# Patient Record
Sex: Male | Born: 1935 | Race: White | Hispanic: No | Marital: Married | Smoking: Former smoker
Health system: Southern US, Community
[De-identification: ages and names within clinical notes are randomized; demographics above are authoritative.]

## PROBLEM LIST (undated history)

## (undated) DIAGNOSIS — G20A1 Parkinson's disease without dyskinesia, without mention of fluctuations: Secondary | ICD-10-CM

## (undated) DIAGNOSIS — C61 Malignant neoplasm of prostate: Secondary | ICD-10-CM

## (undated) DIAGNOSIS — Z951 Presence of aortocoronary bypass graft: Secondary | ICD-10-CM

## (undated) DIAGNOSIS — I10 Essential (primary) hypertension: Secondary | ICD-10-CM

## (undated) DIAGNOSIS — J189 Pneumonia, unspecified organism: Secondary | ICD-10-CM

## (undated) DIAGNOSIS — I4891 Unspecified atrial fibrillation: Secondary | ICD-10-CM

## (undated) DIAGNOSIS — I442 Atrioventricular block, complete: Secondary | ICD-10-CM

## (undated) DIAGNOSIS — G20 Parkinson's disease: Secondary | ICD-10-CM

## (undated) DIAGNOSIS — I472 Ventricular tachycardia: Secondary | ICD-10-CM

## (undated) DIAGNOSIS — E785 Hyperlipidemia, unspecified: Secondary | ICD-10-CM

## (undated) DIAGNOSIS — C801 Malignant (primary) neoplasm, unspecified: Secondary | ICD-10-CM

## (undated) DIAGNOSIS — I2510 Atherosclerotic heart disease of native coronary artery without angina pectoris: Secondary | ICD-10-CM

## (undated) DIAGNOSIS — Z8546 Personal history of malignant neoplasm of prostate: Secondary | ICD-10-CM

## (undated) DIAGNOSIS — Z950 Presence of cardiac pacemaker: Secondary | ICD-10-CM

## (undated) DIAGNOSIS — D649 Anemia, unspecified: Secondary | ICD-10-CM

## (undated) DIAGNOSIS — J90 Pleural effusion, not elsewhere classified: Secondary | ICD-10-CM

## (undated) DIAGNOSIS — I4892 Unspecified atrial flutter: Secondary | ICD-10-CM

## (undated) HISTORY — DX: Pneumonia, unspecified organism: J18.9

## (undated) HISTORY — DX: Presence of aortocoronary bypass graft: Z95.1

## (undated) HISTORY — DX: Personal history of malignant neoplasm of prostate: Z85.46

## (undated) HISTORY — DX: Anemia, unspecified: D64.9

## (undated) HISTORY — DX: Unspecified atrial flutter: I48.92

## (undated) HISTORY — DX: Hyperlipidemia, unspecified: E78.5

## (undated) HISTORY — PX: TONSILLECTOMY: SUR1361

## (undated) HISTORY — DX: Ventricular tachycardia: I47.2

## (undated) HISTORY — DX: Atrioventricular block, complete: I44.2

---

## 1998-09-03 ENCOUNTER — Other Ambulatory Visit: Admission: RE | Admit: 1998-09-03 | Discharge: 1998-09-03 | Payer: Self-pay | Admitting: Urology

## 1999-05-11 HISTORY — PX: PROSTATE SURGERY: SHX751

## 1999-08-02 ENCOUNTER — Encounter: Payer: Self-pay | Admitting: Emergency Medicine

## 1999-08-02 ENCOUNTER — Emergency Department (HOSPITAL_COMMUNITY): Admission: EM | Admit: 1999-08-02 | Discharge: 1999-08-02 | Payer: Self-pay | Admitting: Emergency Medicine

## 2000-02-03 ENCOUNTER — Other Ambulatory Visit: Admission: RE | Admit: 2000-02-03 | Discharge: 2000-02-03 | Payer: Self-pay | Admitting: Urology

## 2000-02-03 ENCOUNTER — Encounter (INDEPENDENT_AMBULATORY_CARE_PROVIDER_SITE_OTHER): Payer: Self-pay | Admitting: Specialist

## 2001-08-30 ENCOUNTER — Inpatient Hospital Stay (HOSPITAL_COMMUNITY): Admission: EM | Admit: 2001-08-30 | Discharge: 2001-09-10 | Payer: Self-pay | Admitting: Emergency Medicine

## 2001-09-01 ENCOUNTER — Encounter: Payer: Self-pay | Admitting: *Deleted

## 2001-09-04 ENCOUNTER — Encounter: Payer: Self-pay | Admitting: Cardiothoracic Surgery

## 2001-09-04 DIAGNOSIS — Z951 Presence of aortocoronary bypass graft: Secondary | ICD-10-CM

## 2001-09-04 HISTORY — PX: CORONARY ARTERY BYPASS GRAFT: SHX141

## 2001-09-04 HISTORY — DX: Presence of aortocoronary bypass graft: Z95.1

## 2001-09-05 ENCOUNTER — Encounter: Payer: Self-pay | Admitting: Cardiothoracic Surgery

## 2001-09-06 ENCOUNTER — Encounter: Payer: Self-pay | Admitting: Cardiothoracic Surgery

## 2001-09-07 ENCOUNTER — Encounter: Payer: Self-pay | Admitting: Cardiothoracic Surgery

## 2001-09-25 ENCOUNTER — Encounter: Payer: Self-pay | Admitting: Cardiology

## 2001-09-25 ENCOUNTER — Encounter: Admission: RE | Admit: 2001-09-25 | Discharge: 2001-09-25 | Payer: Self-pay | Admitting: Cardiology

## 2001-09-26 ENCOUNTER — Encounter (HOSPITAL_COMMUNITY): Admission: RE | Admit: 2001-09-26 | Discharge: 2001-12-25 | Payer: Self-pay | Admitting: Cardiology

## 2001-10-06 ENCOUNTER — Encounter: Payer: Self-pay | Admitting: Cardiothoracic Surgery

## 2001-10-06 ENCOUNTER — Encounter: Admission: RE | Admit: 2001-10-06 | Discharge: 2001-10-06 | Payer: Self-pay | Admitting: Cardiothoracic Surgery

## 2001-10-30 ENCOUNTER — Encounter: Payer: Self-pay | Admitting: Cardiothoracic Surgery

## 2001-10-30 ENCOUNTER — Encounter: Admission: RE | Admit: 2001-10-30 | Discharge: 2001-10-30 | Payer: Self-pay | Admitting: Cardiothoracic Surgery

## 2001-12-26 ENCOUNTER — Encounter (HOSPITAL_COMMUNITY): Admission: RE | Admit: 2001-12-26 | Discharge: 2002-03-26 | Payer: Self-pay | Admitting: Cardiology

## 2006-12-09 ENCOUNTER — Encounter: Admission: RE | Admit: 2006-12-09 | Discharge: 2006-12-09 | Payer: Self-pay | Admitting: Cardiology

## 2006-12-12 ENCOUNTER — Ambulatory Visit (HOSPITAL_COMMUNITY): Admission: RE | Admit: 2006-12-12 | Discharge: 2006-12-13 | Payer: Self-pay | Admitting: Cardiology

## 2006-12-12 DIAGNOSIS — Z950 Presence of cardiac pacemaker: Secondary | ICD-10-CM

## 2006-12-12 HISTORY — DX: Presence of cardiac pacemaker: Z95.0

## 2006-12-12 HISTORY — PX: PACEMAKER INSERTION: SHX728

## 2010-01-09 HISTORY — PX: NM MYOVIEW LTD: HXRAD82

## 2010-07-17 ENCOUNTER — Inpatient Hospital Stay (INDEPENDENT_AMBULATORY_CARE_PROVIDER_SITE_OTHER)
Admission: RE | Admit: 2010-07-17 | Discharge: 2010-07-17 | Disposition: A | Discharge status: Home / Self Care | Payer: MEDICARE | Source: Ambulatory Visit | Attending: Emergency Medicine | Admitting: Emergency Medicine

## 2010-07-17 ENCOUNTER — Ambulatory Visit (INDEPENDENT_AMBULATORY_CARE_PROVIDER_SITE_OTHER): Payer: MEDICARE

## 2010-07-17 DIAGNOSIS — R1011 Right upper quadrant pain: Secondary | ICD-10-CM

## 2010-07-17 DIAGNOSIS — N200 Calculus of kidney: Secondary | ICD-10-CM

## 2010-07-17 LAB — DIFFERENTIAL
Eosinophils Absolute: 0.3 10*3/uL (ref 0.0–0.7)
Eosinophils Relative: 4 % (ref 0–5)
Lymphocytes Relative: 28 % (ref 12–46)
Lymphs Abs: 2.2 10*3/uL (ref 0.7–4.0)
Monocytes Absolute: 1.1 10*3/uL — ABNORMAL HIGH (ref 0.1–1.0)
Monocytes Relative: 14 % — ABNORMAL HIGH (ref 3–12)

## 2010-07-17 LAB — CBC
HCT: 37.1 % — ABNORMAL LOW (ref 39.0–52.0)
MCH: 30.8 pg (ref 26.0–34.0)
MCV: 95.4 fL (ref 78.0–100.0)
Platelets: 206 10*3/uL (ref 150–400)
RDW: 14.3 % (ref 11.5–15.5)
WBC: 7.8 10*3/uL (ref 4.0–10.5)

## 2010-07-17 LAB — POCT URINALYSIS DIPSTICK
Hgb urine dipstick: NEGATIVE
Ketones, ur: NEGATIVE mg/dL
Protein, ur: NEGATIVE mg/dL
Specific Gravity, Urine: <=1.005 (ref 1.005–1.030)
Urobilinogen, UA: 0.2 mg/dL (ref 0.0–1.0)
pH: 6 (ref 5.0–8.0)

## 2010-09-22 NOTE — Cardiovascular Report (Signed)
NAME:  [name] Powell, [name] Powell NO.:  [id number]   MEDICAL RECORD NO.:  [id number]          PATIENT TYPE:  OIB   LOCATION:  6524                         FACILITY:  [facility]   PHYSICIAN:  Jeanella Craze. Little, M.D. DATE OF BIRTH:  [birth year]   DATE OF PROCEDURE:  DATE OF DISCHARGE:                            CARDIAC CATHETERIZATION   INDICATIONS FOR TEST:  This 77 year old male had bypass surgery in 2003.  He underwent a routine 5-year Cardiolite study.  While walking on the  treadmill, he had no symptoms, but as soon as the procedure was over he  became dizzy.  At this point, he had an episode of complete heart block.  His rate, however, did not drop below 75 with this.  He has had for a  long period of time, a first-degree AV block with a PR interval of about  0.4.  When he came in for his treadmill test, he had developed a new  left bundle branch block.   A nuclear study raised a concern of some anterior apical ischemia, and  because of this the patient is brought in for cardiac catheterization  and plans for permanent pacemaker implantation.   After obtaining informed consent, the patient was prepped and draped in  the usual sterile fashion exposing the right groin.  Following local  anesthetic with 1% Xylocaine, the Seldinger technique was employed, and  a 5-French introducer sheath was placed in the right femoral artery.  Left and right coronary arteriography, graft visualization and  ventriculography was performed.   Initially, an 035 guide wire would not pass up his iliac.  A JR-4 with a  Glidewire was used to navigate the iliacs, and once the catheter was in  the aortic arch, with each catheter exchange, wire exchange technique  was used.   COMPLICATIONS:  None.   EQUIPMENT:  JL-3.5 5-French catheters used to cannulate his left main  and internal mammary artery, was used to first get up and then engage  the internal mammary artery.  The vein grafts were all  cannulated with a  JR-4 catheter.   TOTAL CONTRAST USED:  210 mL.   RESULTS:  Hemodynamic monitoring.  Central aortic pressure was 168/60.  Left ventricular pressure was 169/10 with no gradient noted at the time  of pullback.  The left ventricular end-diastolic pressure was 15.   Ventriculography.  Ventriculography in the RAO projection revealed the  LV cavity to be slightly dilated, and it was clearly underfilled.  The  ejection fraction around 50%.  The left ventricular end-diastolic  pressure was 15.   (On his Cardiolite study, his ejection fraction was calculated at 54%).   DISTAL AORTOGRAM:  The distal aortogram was performed because of  difficulty passing and 035 guidewire up his iliacs.  Contrast 20 mL at  12 mL per second were used.  There was no infrarenal aneurysm, and there  is no evidence of iliac disease.  Renal arteries were poorly visualized.   CORONARY ARTERIOGRAPHY:  On fluoroscopy, there was dense calcification  in the left main and proximal LAD.  1. Left main.  There was  terminal 80% narrowing that was hypo-lucent.  2. Ramus intermediate.  This vessel in its proximal segment showed bi-      directional flow, and the distal vessel was not visualized via the      native circulation.  3. Circumflex.  The circumflex gave rise to an OM system that      bifurcated.  Just proximal this OM system, was a focal 90% area of      narrowing.  4. Right coronary artery.  The right coronary was 100% occluded in its      mid-portion.  In the proximal segment, there are multiple areas of      40% to 50% sequential narrowing.  5. Right coronary artery.  Right coronary was a large vessel.  It gave      rise to the PDA, and after this there was bi-directional flow.  At      the acute margin of the heart was a 15-mm long segment of about 80%      to 90% narrowing.  6. Grafts.  Saphenous vein graft to the ramus intermediate.  The graft      was widely patent.  The ramus was widely  patent.  7. Saphenous vein graft to the diagonal.  Graft was widely patent,      diagonal widely patent.  8. Saphenous vein graft to the PDA.  The graft was widely patent.  The      PDA was widely patent, and there was retrograde filling of three      very large posterior lateral vessels that were also free of      disease.  9. Internal mammary artery to the mid-LAD.  The internal mammary      artery was widely patent.  However, there was a small tubular area      in the proximal portion of the IMA that was about 20% to 30%      narrowed.  The distal LAD appeared to be relatively small, compared      to the other vessels and free of significant disease.   CONCLUSION:  1. Severe native disease with widely patent grafts x4.  2. Normal LV systolic function with only mild dilatation of the left      ventricular cavity.   At the end of the procedure, Dr. Tami Ribas took over for implantation of a  pacemaker for intermittent complete heart block.           ______________________________  Jeanella Craze. Little, M.D.     ABL/MEDQ  D:  12/12/2006  T:  12/12/2006  Job:  [id number]   cc:   Juanda Bond. Altheimer, M.D.  Octavia Heir, MD  Catheterization Lab  Jeanella Craze. Little, M.D.

## 2010-09-22 NOTE — Op Note (Signed)
NAME:  GIANPAOLO, MCMULLEN NO.:  [id number]   MEDICAL RECORD NO.:  [id number]          PATIENT TYPE:  OIB   LOCATION:  [id number]                         FACILITY:  [facility]   PHYSICIAN:  Octavia Heir, MD  DATE OF BIRTH:  Apr 08, 1923   DATE OF PROCEDURE:  12/12/2006  DATE OF DISCHARGE:                               OPERATIVE REPORT   PROCEDURE:  Insertion of a Medtronic Adapta LADDRL1, serial number  [id number] with active atrial and ventricular leads.   COMPLICATIONS:  None.   INDICATIONS:  Mr. Engquist is an 77 year old male patient Dr. Aldona Bar  and Dr. Legrand Como Altheimer with a history of knee history of CAD and  status post remote coronary bypass surgery in April 2003, history of a  chronic bradycardia with first degree AV block.  He did recently develop  a new left bundle branch block.  He underwent exercise treadmill testing  with subsequent development of a brief episode of complete heart block,  which was transient and resolved with rest.  Subsequent Cardiolite scan  suggested a mild anterior wall ischemia.  He is now brought for cardiac  catheterization by Dr. Aldona Bar revealing a widely patent LIMA to his  LAD, patent vein graft to ramus intermedius, patent vein graft to  diagonal, patent vein graft to the PDA.  He does have significant  negative three-vessel disease.  Ejection fraction is approximately 50%.  He is now brought for dual-chamber pacer implant.   DESCRIPTION OF OPERATION:  After informed consent, the patient left  anterior chest was shaved, prepped and draped sterile fashion.  EC  monitor established.  1% lidocaine was then used anesthetize left mid  subclavicular area.  Next approximately 3 cm mid subclavicular incision  was then carried out.  Hemostasis obtained with electrocautery.  Blunt  dissection used to carry this down to the left pectoral fascia.  Again  hemostasis obtained electrocautery.  Next approximately 3 x 4-cm pocket  was created  left pectoral fascia.  The left subclavian vein was then  easily entered with two retained guidewires.  Over the first retained  guidewire a #7-French dilator sheath were then easily inserted and  guidewire and dilator then removed.  Through the first sheath a 58-cm  active Medtronic lead model number 5076 serial #[id number] [id number] then passed  into the right atrium.  Peel-away sheaths then removed.  Over the second  retained guidewire a second 7-French dilator sheath then easily passed  and the dilator and guidewire removed.  Through this a 50-cm active  Medtronic lead model [id number] serial [id number] was then easily passed into  right atrium and peel-away sheaths removed.  A J curve was then placed  in the ventricular lead and ventricular lead was allowed to prolapse  through the tricuspid valve and positioned the RV apex without  difficulty.  We were able to capture 2 volts, the screw was then  extended.  Thresholds determined.  R-waves measured 13.8 mV.  Impedance  1064 ohms.  Threshold ventricle was 0.9 volts at 0.5 milliseconds..  Current was 1.1 mA.  10 volts negative for diaphragmatic stimulation.  The preformed J stylet was then inserted in the atrial lead and the  atrial lead was then used to map the right atrium.  It was difficult to  find an excellent position for the atrial lead; however,  ultimately we  were able to find a P-waves of 1.7 to 2.  Threshold in the atrium was  1.2 at 0.5 during test; however, this dropped to less than 1 post  implant.  Currents 1.6 ma.  Impedance 983 ohms.  Again 10 volts negative  diaphragmatic stimulation.  This lead was screwed into place as well.  Two silk sutures then used to anchor the leads to the pectoral fascia.  Again hemostasis obtained with electrocautery.  Pockets then copiously  irrigated with 1% kanamycin solution.  The leads were connected, serial  fashion to a Medtronic Adapta LADDR L1 serial [id number].  Head screws  were tightened and  pacing was confirmed.  Single silk suture placed in  the apex pocket.  The generator leads then delivered to pocket.  Header  was secured with silk suture.  Subcutaneous layer was then closed using  2-0 Vicryl.  The skin was then closed in 4-0 Vicryl.  Steri-Strips were  applied.  The patient returned to recovery room in stable condition.   CONCLUSIONS:  Successful implant of a Medtronic Adapta LADDRL1, serial  [id number] with active atrial and ventricular leads.      Octavia Heir, MD  Electronically Signed     RHM/MEDQ  D:  12/12/2006  T:  12/13/2006  Job:  [id number]   cc:   Jeanella Craze. Little, M.D.  Juanda Bond. Altheimer, M.D.

## 2010-09-25 NOTE — Cardiovascular Report (Signed)
[place]. [facility]  Patient:    [name] Powell, [name] Powell Visit Number: [id number] MRN: [id number]          Service Type: MED Location: 2000 2021 01 Attending Physician:  Lawana Pai Dictated by:   Chase Picket, M.D. Proc. Date: 08/31/01 Admit Date:  08/30/2001   CC:         Juanda Bond. Altheimer, M.D.  James L. Oletta Lamas, M.D.  CVTS   Cardiac Catheterization  INDICATIONS FOR PROCEDURE: The patient is 77 year old male, who has had a three months history of exertional angina. Yesterday, had a more prolonged episode of angina that was more intense in nature, came to the emergency room and was pain-free. His ECG at that time was completely normal except for bradycardia and first-degree AV block. His cardiac enzymes were low-positive and he was brought to the catheterization lab for cardiac catheterization.  DESCRIPTION OF PROCEDURE: The patient was prepped and draped in the usual sterile fashion exposing the right groin.  Following local anesthetic with 1% Xylocaine, the Seldinger technique was employed and a 5 [place] introducer sheath was placed into the right femoral artery.  Selective left and right coronary arteriography and ventriculography in the RAO projection was performed.  COMPLICATIONS: None.  EQUIPMENT: A 5 French Judkins configuration catheters, 3.5 JL4 used to cannulate the left main.  TOTAL CONTRAST: 95 cc.  RESULTS: 1. Hemodynamic monitoring: Central aortic pressure 144/59, left    ventricular pressure 145/8. There was no aortic valve gradient noted at    the time of pullback. 2. Ventriculography: Ventriculography in the RAO projection revealed    normal left ventricular systolic function.  The ejection fraction    was greater than 60%.  The end-diastolic pressure was 8. PVCs were    noted during the ventriculogram.  CORONARY ARTERIOGRAPHY: Calcification in the left main and LAD distribution was noted on fluoroscopy. 1. Left  main: There was a 60% hazy area of narrowing in the midportion    of the left main. 2. LAD: The LAD had a complex lesion just proximal to the bifurcation of the    LAD and the diagonal. This area was at least 70% narrowed. The ongoing    LAD was irregular and then totally occluded in the proximal/distal    segment. There was retrograde filling of the distal LAD. 3. First diagonal: The first diagonal had proximal 50% area of narrowing    with mild irregularities. 4. Optional diagonal: Optional diagonal was a very large vessel with mild    irregularities. 5. Circumflex: The circumflex was a nondominant vessel with a small    OM with high-grade stenosis in the proximal portion of this 1 mm    vessel. 6. Right coronary artery: The right coronary artery is a very large dominant    vessel with a 90% lesion in its midportion. The PDA and posterolateral    branches were free of disease.  CONCLUSIONS: Left main disease, high-grade disease and left anterior descending and right coronary artery normal with normal left ventricular function.  RECOMMENDATIONS: The patient needs bypass surgery. Will restart heparin and contact CVTS. Dictated by:   Chase Picket, M.D. Attending Physician:  Lawana Pai DD:  08/31/01 TD:  08/31/01 Job: [id number]

## 2010-09-25 NOTE — Consult Note (Signed)
[place]. [facility]  Patient:    [name] Powell, [name] Powell Visit Number: [id number] MRN: [id number]          Service Type: MED Location: 2000 2021 01 Attending Physician:  Lawana Pai Dictated by:   Len Childs, M.D. Proc. Date: 09/01/01 Admit Date:  08/30/2001   CC:         Juanda Bond. Altheimer, M.D.   Consultation Report  REASON FOR CONSULTATION:  Left main and three-vessel coronary artery disease, unstable angina.  REQUESTING PHYSICIAN:  Chase Picket, M.D.  PRIMARY CARE PHYSICIAN: Juanda Bond. Altheimer, M.D.  CHIEF COMPLAINT:  Chest pain.  HISTORY OF PRESENT ILLNESS:  I was asked to see this 77 year old white male in consultation by Dr. Rex Kras for evaluation of potential surgical coronary revascularization for recently diagnosed left main and severe three-vessel coronary artery disease.  The patient has a history of coronary artery disease and underwent cardiac catheterization in 1994, which demonstrated mild disease.  He was treated medically.  He has remained very active, walking four miles a day and doing push ups, until recently.  He noted the onset of chest pain during his walk.  The pain would be relieved after stopping the walk and was not associated with nausea, diaphoresis or shortness of breath.  It was substernal and tight squeezing pain.  The patient began having severe pain on the date of admission (August 30, 2001) and presented to the emergency room for evaluation.  The pain did not resolve with rest.  An EKG showed nonspecific ST segment changes, and an CPK-MB showed 3.3 nanogram/mL.  He was placed on heparin and given beta blocker, aspirin and nitroglycerin; underwent cardiac catheterization.  This demonstrated a 60% left main stenosis, with 99-100% stenosis of the LAD (which filled via collaterals distally), 70% stenosis of the ramus intermediate and 95% stenosis of the right coronary artery.  His ejection fraction was  55% and left ventricular end-diastolic pressure was 10. He was maintained on heparin post-catheterization without recurrent pain.  The patient is currently stable in the heart unit.  He is awaiting surgical evaluation.  PAST MEDICAL HISTORY: 1. Diet-controlled diabetes mellitus. 2. History of prostate cancer, status post seed irradiation in October 2001.    Without evidence of recurrence. 3. Hyperlipidemia. 4. History of bradycardia.  HOME MEDICATIONS:  Aspirin, Lipitor, Flomax.  He has been placed on Plavix since admission to the hospital.  ALLERGIES:  No known drug allergies.  SOCIAL HISTORY:  The patient is married and has an adult daughter.  He is a retired Art gallery manager and is quite active -- walking four miles a day. He does not smoke nor use alcohol.  FAMILY HISTORY:  His mother died of a heart attack in her 17s.  Otherwise negative.  REVIEW OF SYSTEMS:  The patient denies any constitutional symptoms of weight loss, fever or night sweats.  He denies any significant trauma to the thorax or lower extremities.  He denies any bleeding problems or easy bruising. He has had previous surgical procedures for tonsillectomy, left knee arthroscopy and the seed irradiation of his prostate gland.  He developed a superficial rectal ulcer from the irradiated seeds, which was treated with a topical suppository by Dr. Laurence Spates (his gastroenterologist), with resolution of the problem.  He denies any pulmonary history of pneumonia, hemoptysis or TB. He denies any cardiac history other than angina.  Specifically, no symptoms of congestive heart failure orthopnea or PND.  He has had longstanding bradycardia.  He denies any GI history of gallstones, jaundice or hepatitis, or ulcer disease.  He denies any musculoskeletal problems of gout or significant arthritis.  He denies vascular problems with claudication, DVT, TIA or CVA.  He denies neurologic problems of seizures, syncope  or compression.  Skin review is negative for rash or skin cancer.  Psychiatric review is negative for depression, insomnia or diminished appetite.  Otherwise review of systems is negative.  PHYSICAL EXAMINATION:  VITAL SIGNS:  Height 5 feet 8 inches, weight 180 pounds, blood pressure 140/60, heart rate 50 and regular in sinus, oxygen saturation 97% on room air.  GENERAL APPEARANCE:  That of an elderly, pleasant white male -- in his hospital room accompanied by his wife and daughter; on intravenous heparin. He is complaining of no chest pain and he appears in no distress.  He is drinking a cup of tea.  HEENT:  Normocephalic.  Full EOMs.  Pharynx clear.  Dentition under good repair.  NECK:  Supple without JVD, thyromegaly, mass or carotid bruits.  LYMPHATICS:  Reveals no palpable adenopathy of the cervical, supraclavicular or axillary regions.  LUNGS:  Clear to auscultation without thoracic deformity.  CARDIAC:  With a regular rate and rhythm, which is slow.  No murmur, rub or gallop.  ABDOMEN:  Soft and nontender; without organomegaly, mass or abdominal bruit.  EXTREMITIES:  Reveal no swollen or tender joints, clubbing, edema or cyanosis. The right groin calf site has a very small ecchymotic area, but no hematoma.  VASCULAR:  Reveals 2+ pulses bilaterally in the radial, femoral and tibial vessels.  There is no venous insufficiency of the lower extremities.  SKIN:  Warm, clear and dry; without rash or lesion.  RECTAL:  Deferred.  NEUROLOGIC:  Alert and oriented x3, with full motor function without focal deficit.  LABORATORY DATA:  Creatinine 0.9, hematocrit 38, platelet count 170,000.  LFTs normal.  CHEST X-RAY:  Pending.  ASSESSMENT:  I reviewed the coronary arteriograms with Dr. Rex Kras and agree with his impression of left main and three-vessel disease.  I agree with his recommendation for surgical coronary revascularization.   PLAN:  I have discussed the  indications and expected benefits of coronary bypass surgery for treatment of his coronary artery disease.  I have discussed the major aspects of the proposed operation with the patient and his family, including the choice of conduit for grafting, the location of the surgical incisions, the use of general anesthesia and cardiopulmonary bypass, and the expected postoperative hospital recovery.  I reviewed with the patient the alternatives to surgical therapy for treatment of his heart disease.  We discussed the risks of coronary artery bypass graft surgery at the time of my consultation, and we discussed specifically the risks of MI, CVA, bleeding, infection, blood transfusion requirement and death.  We will discontinue the Plavix to try and optimize his coagulation function following surgery, and continue the heparin.  All questions are answered and the surgery will be scheduled for Monday afternoon, September 04, 2001.  Thank you very much for this consultation.   Dictated by:   Len Childs, M.D. Attending Physician:  Lawana Pai DD:  09/01/01 TD:  09/01/01 Job: [id number] [id number]

## 2010-09-25 NOTE — Discharge Summary (Signed)
[place]. [facility]  Patient:    [name] Powell, [name] Powell Visit Number: [id number] MRN: [id number]          Service Type: MED Location: 2000 2034 01 Attending Physician:  Len Childs Dictated by:   Sheliah Hatch, P.A. Admit Date:  08/30/2001 Discharge Date: 09/10/2001   CC:         Chase Picket, M.D.  Juanda Bond. Altheimer, M.D.   Discharge Summary  DATE OF BIRTH:  1923-01-17  CARDIOLOGIST:  Chase Picket, M.D.  PRIMARY CARE PHYSICIAN:  Juanda Bond. Altheimer, M.D.  ADMISSION DIAGNOSIS:  Chest pain.  FINAL DIAGNOSES: 1. Unstable angina. 2. Non-Q wave myocardial infarction. 3. Severe three-vessel coronary artery disease with normal systolic function,    ejection fraction of 60%. 4. Hyperlipidemia. 5. Type 2 diabetes mellitus. 6. Postoperative bradycardia. 7. Acute renal insufficiency. 8. Postoperative anemia. 9. History of prostate cancer.  PROCEDURES: 1. Cardiac catheterization on August 31, 2001. 2. Ankle brachial indexes and carotid Dopplers on August 31, 2001 showing no    internal carotid artery stenosis and palpable pulses in both feet. 3. Coronary artery bypass graft x4 on September 04, 2001.  HISTORY:  The patient is a 77 year old white male patient of Dr. Aldona Bar with known history of coronary artery disease.  He had a previous cardiac catheterization in 1994 and has been treated medically since.  He has been very active, exercising regularly.  He has recently noticed the onset of chest pain with walking, relieved with rest.  The day of admission, he presented to the emergency room with severe chest pain, which did not resolve with rest. Cardiogram showed nonspecific ST changes and cardiac enzymes were elevated. He was ruled in for non-Q wave MI.  He was seen by cardiology and stabilized. He was taken to the cardiac catheterization lab the following day and the catheterization demonstrated severe three-vessel coronary disease.   CVTS consultation was obtained.  Dr. Prescott Gum evaluated Mr. Shallow and recommended coronary artery bypass grafting as best treatment.  The risks, benefits, details, and alternatives were discussed and it was agreed to proceed with surgery.  He remained stable for the next few days and underwent surgery on September 04, 2001.  He had CABG x4 with the following grafts:  LIMA to LAD, saphenous vein graft to PDA, saphenous vein graft to diagonal, and saphenous vein graft to ramus.  Postoperative, he did well with routine care.  He did have some bradycardia, which required atrial pacing, but this did not impede his progress.  He was transferred to unit 2000 postoperative day #2.  Of note, he was found to have elevated blood sugars and has been ruled in for type 2 diabetes mellitus.  He has been put on Amaryl after a few days of sliding scale insulin.  He has done well on unit 2000, walking with cardiac rehab.  He was anemic and has been put on iron.  Postoperative day #5, he is doing very well.  He is afebrile.  Vital signs are stable.  Wounds are healing well. Physical exam is satisfactory.  CBGs are adequately controlled.  It is anticipated he should be suitable for discharge pending satisfactory morning rounds on Sep 10, 2001.  DISCHARGE MEDICATIONS: 1. Enteric-coated aspirin 325 mg 1 p.o. q.d. 2. Flomax 0.4 mg 1 p.o. b.i.d. 3. Amaryl 1 mg p.o. q.d. 4. Niferex 150 one p.o. q.d. 5. Colace 100 mg 2 tablets daily. 6. Lipitor 20 mg daily. 7. Percocet 5/325  one to two p.o. q.4-6h. p.r.n. for pain.  ALLERGIES:  No known drug allergies.  CONDITION:  Stable.  DISPOSITION:  Home.  FOLLOWUP: 1. Staples out in one week. 2. He will see Dr. Rex Kras in two weeks. 3. He will see Dr. Prescott Gum in three weeks with [facility]. Dictated by:   Sheliah Hatch, P.A. Attending Physician:  Len Childs DD:  09/09/01 TD:  09/12/01 Job: [id number]

## 2010-09-25 NOTE — Op Note (Signed)
[place]. [facility]  Patient:    [name], [name] Powell Visit Number: [id number] MRN: [id number]          Service Type: MED Location: [id number] Attending Physician:  Len Childs Dictated by:   Len Childs, M.D. Proc. Date: 09/04/01 Admit Date:  08/30/2001   CC:         CVTS office  Delrae Alfred B. Little, M.D.   Operative Report  PREOPERATIVE DIAGNOSIS:  Left main stenosis, severe three-vessel coronary artery disease, unstable angina.  POSTOPERATIVE DIAGNOSIS:  Left main stenosis, severe three-vessel coronary artery disease, unstable angina.  OPERATION PERFORMED:  Coronary artery bypass grafting times four (left internal mammary artery to left anterior descending, saphenous vein graft to diagonal to diagonal, saphenous vein graft to ramus intermediate.  Saphenous vein graft to posterior descending coronary artery).  SURGEON:  Len Childs, M.D.  ASSISTANT: Gratiot C. Roxan Hockey, M.D. 2. Sheliah Hatch, P.A.  ANESTHESIA:  General by Dr. Lorrene Reid.  INDICATIONS FOR PROCEDURE:  The patient is a 77 year old diet controlled diabetic who presented with symptoms of unstable angina and ruled out for MI. Cardiac cath demonstrated left main and severe three-vessel coronary artery disease.  He was felt to be a candidate for surgical coronary revascularization.  Prior to his operation I discussed the cardiac catheterization with the patient and his wife.  I also discussed with the patient and his wife in his hospital room the indications and expected benefits of coronary artery bypass surgery for treatment of his coronary artery disease.  I discussed the potential alternative therapies to treat his coronary artery disease.  I reviewed with the patient and his wife the major aspects of the coronary operation including the choice of conduit for grafting, the location of the surgical incisions, the use of general anesthesia and  cardiopulmonary bypass, and the expected postoperative hospital recovery.  I reviewed with the patient the risks of to him of coronary artery bypass surgery including the risks of MI, CVA, bleeding, blood transfusion requirement, infection and death.  He understood these implications for the surgery and agreed to proceed with the operating room as planned under what I felt was an informed consent.  OPERATIVE FINDINGS:  The saphenous vein was harvested from both lower extremities.  It became two small to use below the right knee and vein was removed from the left lower leg as well.  The mammary artery was small but had good flow.  The LAD was severely and diffusely diseased but a 1.5 probe passed to the apex from the point of the anastomosis.  The distal circumflex system was too small to graft.  The right coronary artery was dominant and was heavily diseased with thick plaque proximally and the graft was placed in the posterior descending coronary artery just off the main right coronary.  No blood products or inotropes were used for the surgery.  DESCRIPTION OF PROCEDURE:  The patient was brought to the operating room and placed supine on the operating table where general anesthesia was induced under invasive hemodynamic monitoring.  The chest abdomen and legs were prepped with Betadine and draped as a sterile field.  A sternal incision was made as the saphenous vein was harvested from the lower extremities.  The internal mammary artery was harvested as a pedicle graft from its origin at the subclavian vessels.  Heparin was administered and the ACT was documented as being therapeutic.  The sternal retractor was placed.  Through  pursestrings placed in the ascending aorta and right atrium, the patient was cannulated and placed on bypass.  The coronaries were identified for grafting and the mammary artery and vein grafts were prepared for the distal anastomoses.  The cardioplegia cannula was  placed and the patient was cooled to 28 degrees.  As the aortic crossclamp was applied.  900 cc of cold blood cardioplegia was delivered to the aortic root with immediate cardioplegic arrest and septal temperature dropping to less than 15 degrees.  Topical iced saline slush was used to augment myocardial preservation and a pericardial insulator pad was used to protect the left phrenic nerve.  The distal coronary anastomoses were then performed.  The first distal anastomosis was to the posterior descending.  This was a 1.5 mm vessel with proximal 95% stenosis.  A reversed saphenous vein was sewn end-to-side with a running 7-0 Prolene with a good flow through the graft.  The second distal anastomosis was to the diagonal.  This was a 1.5 vessel with proximal 95% stenosis.  A reversed saphenous vein was sewn end-to-side with running 7-0 Prolene with good flow through the graft.  The third distal anastomosis was to the ramus intermediate.  This was a heavily diseased vessel with 90% proximal stenosis.  The reversed saphenous vein was sewn end-to-side with running 7-0 Prolene and there was good flow through the graft.  Cardioplegia was redosed. A fourth distal anastomosis was to the distal LAD where it reconstituted after being proximally occluded.  The left internal mammary artery pedicle was brought through an opening created in the left lateral pericardium and was brought down onto the LAD and sewn end-to-side with a running 8-0 Prolene. There was excellent flow through the anastomosis with immediate rise in septal temperature after release of the pedicle clamp on the mammary artery.  The mammary pedicle was secured to the epicardium and the aortic crossclamp was removed.  The heart was cardioverted back to a regular rhythm.  Three proximal vein anastomoses were placed on the ascending aorta using a partial occlusion clamp and a 4.0 mm punch.  The partial clamp was removed and the vein  grafts were  perfused.  Each had excellent flow and hemostasis was documented at the proximal and distal sites.  The patient was rewarmed and reperfused. Temporary pacing wires were applied.  When the patient reached 37 degrees, the lungs were re-expanded and the ventilator was resumed.  The patient was AV sequentially paced at a rate of 90 beats per minute.  The patient separated from bypass without inotropes with stable blood pressure and cardiac output. Protamine was administered without adverse effect.  The cannulas were removed. The mediastinum was irrigated with warm antibiotic irrigation.  The leg incision was irrigated and closed in a standard fashion.  The superior pericardial fat was closed over the aorta and vein grafts.  Two mediastinal and a left pleural chest tube were placed and brought out through separate incisions.  The sternum was reapproximated with interrupted steel wire.  The pectoralis fascia was closed with interrupted #1 Vicryl.  The subcutaneous tissues and skin were closed with a running Vicryl.  Total bypass time was 110 minutes with aortic crossclamp time of 50 minutes. Dictated by:   Len Childs, M.D. Attending Physician:  Len Childs DD:  09/04/01 TD:  09/05/01 Job: [id number]

## 2011-02-22 LAB — CBC
HCT: 36.6 — ABNORMAL LOW
MCV: 89.7
Platelets: 208
RDW: 14.2 — ABNORMAL HIGH
WBC: 8.1

## 2011-02-22 LAB — BASIC METABOLIC PANEL
BUN: 19
Creatinine, Ser: 1.06
GFR calc non Af Amer: >60
Glucose, Bld: 141 — ABNORMAL HIGH
Potassium: 4.2

## 2011-05-12 ENCOUNTER — Ambulatory Visit
Admission: RE | Admit: 2011-05-12 | Discharge: 2011-05-12 | Disposition: A | Discharge status: Home / Self Care | Payer: Medicare Other | Source: Ambulatory Visit | Attending: Endocrinology | Admitting: Endocrinology

## 2011-05-12 ENCOUNTER — Other Ambulatory Visit: Payer: Self-pay | Admitting: Endocrinology

## 2011-05-12 DIAGNOSIS — R05 Cough: Secondary | ICD-10-CM

## 2011-08-05 ENCOUNTER — Emergency Department (HOSPITAL_COMMUNITY)
Admission: EM | Admit: 2011-08-05 | Discharge: 2011-08-05 | Disposition: A | Discharge status: Home / Self Care | Payer: Medicare Other | Attending: Emergency Medicine | Admitting: Emergency Medicine

## 2011-08-05 ENCOUNTER — Other Ambulatory Visit: Payer: Self-pay

## 2011-08-05 ENCOUNTER — Encounter (HOSPITAL_COMMUNITY): Payer: Self-pay | Admitting: *Deleted

## 2011-08-05 ENCOUNTER — Emergency Department (HOSPITAL_COMMUNITY): Payer: Medicare Other

## 2011-08-05 DIAGNOSIS — I25810 Atherosclerosis of coronary artery bypass graft(s) without angina pectoris: Secondary | ICD-10-CM | POA: Insufficient documentation

## 2011-08-05 DIAGNOSIS — R079 Chest pain, unspecified: Secondary | ICD-10-CM | POA: Insufficient documentation

## 2011-08-05 DIAGNOSIS — Z950 Presence of cardiac pacemaker: Secondary | ICD-10-CM | POA: Insufficient documentation

## 2011-08-05 DIAGNOSIS — R0789 Other chest pain: Secondary | ICD-10-CM

## 2011-08-05 DIAGNOSIS — E119 Type 2 diabetes mellitus without complications: Secondary | ICD-10-CM | POA: Insufficient documentation

## 2011-08-05 DIAGNOSIS — I10 Essential (primary) hypertension: Secondary | ICD-10-CM | POA: Insufficient documentation

## 2011-08-05 HISTORY — DX: Essential (primary) hypertension: I10

## 2011-08-05 HISTORY — DX: Atherosclerotic heart disease of native coronary artery without angina pectoris: I25.10

## 2011-08-05 HISTORY — DX: Presence of cardiac pacemaker: Z95.0

## 2011-08-05 LAB — CBC
MCHC: 32.7 g/dL (ref 30.0–36.0)
RDW: 14.9 % (ref 11.5–15.5)
WBC: 7 10*3/uL (ref 4.0–10.5)

## 2011-08-05 LAB — DIFFERENTIAL
Basophils Absolute: 0 10*3/uL (ref 0.0–0.1)
Basophils Relative: 0 % (ref 0–1)
Lymphocytes Relative: 28 % (ref 12–46)
Neutro Abs: 3.9 10*3/uL (ref 1.7–7.7)
Neutrophils Relative %: 56 % (ref 43–77)

## 2011-08-05 LAB — POCT I-STAT TROPONIN I: Troponin i, poc: 0.02 ng/mL (ref 0.00–0.08)

## 2011-08-05 LAB — POCT I-STAT, CHEM 8
BUN: 30 mg/dL — ABNORMAL HIGH (ref 6–23)
Chloride: 104 mEq/L (ref 96–112)
HCT: 35 % — ABNORMAL LOW (ref 39.0–52.0)
Potassium: 3.9 mEq/L (ref 3.5–5.1)
Sodium: 142 mEq/L (ref 135–145)

## 2011-08-05 NOTE — ED Notes (Signed)
C/o pain just below pacemaker, onset Tuesday night and also Wednesday night, denies other sx, "never had pain before", "some episodes were sharp others were not", pacemaker placed in 2007. Pt of Dr. Aldona Bar.

## 2011-08-05 NOTE — ED Provider Notes (Signed)
History     CSN: [id number]  Arrival date & time 08/05/11  0048   First MD Initiated Contact with Patient 08/05/11 0105      Chief Complaint  Patient presents with  . Chest Pain    (Consider location/radiation/quality/duration/timing/severity/associated sxs/prior treatment) HPI This is an 78 year old white male with a history of pacemaker. For 2 nights in a row he has had chest pain. The pain is sharp, well localized inferior laterally to the pacemaker. It is a sharp sometimes severe fleeting pain that only lasts a few seconds. He states it feels like it is in his muscle (his pectoralis major on the left) rather than deep in his chest. Some episodes are worse than others. The symptoms are exacerbated by lying on his left side. It does not occur during the day. It is not associated with dyspnea, diaphoresis, nausea or vomiting. Pain does not radiate.  Past Medical History  Diagnosis Date  . Diabetes mellitus   . Hypertension   . Pacemaker   . Coronary artery disease     Past Surgical History  Procedure Date  . Pacemaker insertion     av paced  . Coronary artery bypass graft     History reviewed. No pertinent family history.  History  Substance Use Topics  . Smoking status: Never Smoker   . Smokeless tobacco: Not on file  . Alcohol Use: No      Review of Systems  All other systems reviewed and are negative.    Allergies  Review of patient's allergies indicates no known allergies.  Home Medications   Current Outpatient Rx  Name Route Sig Dispense Refill  . ASPIRIN 325 MG PO TABS Oral Take 325 mg by mouth every 7 (seven) days. On Wednesday    . ASPIRIN EC 81 MG PO TBEC Oral Take 81 mg by mouth daily. Daily EXCEPT Wednesday    . ATORVASTATIN CALCIUM 20 MG PO TABS Oral Take 20 mg by mouth daily.    Marland Kitchen VITAMIN D PO Oral Take 10,000 Units by mouth every 7 (seven) days. On Friday    . GLIMEPIRIDE 4 MG PO TABS Oral Take 4 mg by mouth daily before breakfast.    .  METOPROLOL SUCCINATE ER 50 MG PO TB24 Oral Take 25-50 mg by mouth 2 (two) times daily. Take with or immediately following a meal.    . PRESERVISION AREDS PO CAPS Oral Take 1 capsule by mouth 2 (two) times daily.    Marland Kitchen POLYETHYLENE GLYCOL 3350 PO PACK Oral Take 17 g by mouth daily. To prevent constipation    . SITAGLIPTIN PHOSPHATE 100 MG PO TABS Oral Take 100 mg by mouth daily.    Marland Kitchen VALSARTAN-HYDROCHLOROTHIAZIDE 160-12.5 MG PO TABS Oral Take 1 tablet by mouth daily.      BP 134/62  Pulse 62  Temp(Src) 97.9 F (36.6 C) (Oral)  Resp 16  SpO2 99%  Physical Exam General: Well-developed, well-nourished male in no acute distress; appearance consistent with age of record HENT: normocephalic, atraumatic Eyes: pupils equal round and reactive to light; extraocular muscles intact Neck: supple Heart: regular rate and rhythm Lungs: A few basilar rales Chest: Pacemaker left upper chest; nontender Abdomen: soft; nondistended; nontender; bowel sounds present Extremities: No deformity; full range of motion; pulses normal; trace edema of lower legs Neurologic: Awake, alert and oriented; motor function intact in all extremities and symmetric; no facial droop Skin: Warm and dry Psychiatric: Normal mood and affect    ED Course  Procedures (  including critical care time)     MDM  EKG Interpretation:  Date & Time: 08/05/2011 12:56 AM  Rate: 63  Rhythm: AV dual paced  QRS Axis: indeterminate  Intervals: Electronically pain  ST/T Wave abnormalities: indeterminate  Conduction Disutrbances:Electronically paced  Narrative Interpretation:   Old EKG Reviewed: unchanged  Nursing notes and vitals signs, including pulse oximetry, reviewed.  Summary of this visit's results, reviewed by myself:  Labs:  Results for orders placed during the hospital encounter of 08/05/11  CBC      Component Value Range   WBC 7.0  4.0 - 10.5 (K/uL)   RBC 3.71 (*) 4.22 - 5.81 (MIL/uL)   Hemoglobin 11.5 (*) 13.0 -  17.0 (g/dL)   HCT 35.2 (*) 39.0 - 52.0 (%)   MCV 94.9  78.0 - 100.0 (fL)   MCH 31.0  26.0 - 34.0 (pg)   MCHC 32.7  30.0 - 36.0 (g/dL)   RDW 14.9  11.5 - 15.5 (%)   Platelets 188  150 - 400 (K/uL)  DIFFERENTIAL      Component Value Range   Neutrophils Relative 56  43 - 77 (%)   Neutro Abs 3.9  1.7 - 7.7 (K/uL)   Lymphocytes Relative 28  12 - 46 (%)   Lymphs Abs 2.0  0.7 - 4.0 (K/uL)   Monocytes Relative 12  3 - 12 (%)   Monocytes Absolute 0.9  0.1 - 1.0 (K/uL)   Eosinophils Relative 3  0 - 5 (%)   Eosinophils Absolute 0.2  0.0 - 0.7 (K/uL)   Basophils Relative 0  0 - 1 (%)   Basophils Absolute 0.0  0.0 - 0.1 (K/uL)  POCT I-STAT, CHEM 8      Component Value Range   Sodium 142  135 - 145 (mEq/L)   Potassium 3.9  3.5 - 5.1 (mEq/L)   Chloride 104  96 - 112 (mEq/L)   BUN 30 (*) 6 - 23 (mg/dL)   Creatinine, Ser 1.40 (*) 0.50 - 1.35 (mg/dL)   Glucose, Bld 143 (*) 70 - 99 (mg/dL)   Calcium, Ion 1.10 (*) 1.12 - 1.32 (mmol/L)   TCO2 28  0 - 100 (mmol/L)   Hemoglobin 11.9 (*) 13.0 - 17.0 (g/dL)   HCT 35.0 (*) 39.0 - 52.0 (%)  POCT I-STAT TROPONIN I      Component Value Range   Troponin i, poc 0.02  0.00 - 0.08 (ng/mL)   Comment 3           POCT I-STAT TROPONIN I      Component Value Range   Troponin i, poc 0.01  0.00 - 0.08 (ng/mL)   Comment 3             Imaging Studies: Dg Chest 2 View  08/05/2011  *RADIOLOGY REPORT*  Clinical Data: 78 year old male with chest pain.  CHEST - 2 VIEW  Comparison: 05/12/2011 and prior chest radiographs dating back to 12/09/2006  Findings: Cardiomegaly, CABG changes and left-sided pacemaker again noted. Left basilar scarring is again noted. There is no evidence of focal airspace disease, pulmonary edema, suspicious pulmonary nodule/mass, pleural effusion, or pneumothorax. No acute bony abnormalities are identified.  IMPRESSION: Cardiomegaly without evidence of acute cardiopulmonary disease.  Original Report Authenticated By: Lura Em, M.D.   3:38  AM The patient's symptoms are not consistent with pain due to coronary artery disease. He has had 2 negative troponins and unchanged EKG. He will followup with Dr. Aldona Bar later today.  Wynetta Fines, MD 08/05/11 [phone]

## 2011-08-05 NOTE — Discharge Instructions (Signed)
Chest Pain (Nonspecific) It is often hard to give a specific diagnosis for the cause of chest pain. There is always a chance that your pain could be related to something serious, such as a heart attack or a blood clot in the lungs. You need to follow up with your caregiver for further evaluation. CAUSES   Heartburn.   Pneumonia or bronchitis.   Anxiety or stress.   Inflammation around your heart (pericarditis) or lung (pleuritis or pleurisy).   A blood clot in the lung.   A collapsed lung (pneumothorax). It can develop suddenly on its own (spontaneous pneumothorax) or from injury (trauma) to the chest.   Shingles infection (herpes zoster virus).  The chest wall is composed of bones, muscles, and cartilage. Any of these can be the source of the pain.  The bones can be bruised by injury.   The muscles or cartilage can be strained by coughing or overwork.   The cartilage can be affected by inflammation and become sore (costochondritis).  DIAGNOSIS  Lab tests or other studies, such as X-rays, electrocardiography, stress testing, or cardiac imaging, may be needed to find the cause of your pain.  TREATMENT   Treatment depends on what may be causing your chest pain. Treatment may include:   Acid blockers for heartburn.   Anti-inflammatory medicine.   Pain medicine for inflammatory conditions.   Antibiotics if an infection is present.   You may be advised to change lifestyle habits. This includes stopping smoking and avoiding alcohol, caffeine, and chocolate.   You may be advised to keep your head raised (elevated) when sleeping. This reduces the chance of acid going backward from your stomach into your esophagus.   Most of the time, nonspecific chest pain will improve within 2 to 3 days with rest and mild pain medicine.  HOME CARE INSTRUCTIONS   If antibiotics were prescribed, take your antibiotics as directed. Finish them even if you start to feel better.   For the next few  days, avoid physical activities that bring on chest pain. Continue physical activities as directed.   Do not smoke.   Avoid drinking alcohol.   Only take over-the-counter or prescription medicine for pain, discomfort, or fever as directed by your caregiver.   Follow your caregiver's suggestions for further testing if your chest pain does not go away.   Keep any follow-up appointments you made. If you do not go to an appointment, you could develop lasting (chronic) problems with pain. If there is any problem keeping an appointment, you must call to reschedule.  SEEK MEDICAL CARE IF:   You think you are having problems from the medicine you are taking. Read your medicine instructions carefully.   Your chest pain does not go away, even after treatment.   You develop a rash with blisters on your chest.  SEEK IMMEDIATE MEDICAL CARE IF:   You have increased chest pain or pain that spreads to your arm, neck, jaw, back, or abdomen.   You develop shortness of breath, an increasing cough, or you are coughing up blood.   You have severe back or abdominal pain, feel nauseous, or vomit.   You develop severe weakness, fainting, or chills.   You have a fever.  THIS IS AN EMERGENCY. Do not wait to see if the pain will go away. Get medical help at once. Call your local emergency services (911 in [place] Do not drive yourself to the hospital. MAKE SURE YOU:   Understand these instructions.     Will watch your condition.   Will get help right away if you are not doing well or get worse.  Document Released: 02/03/2005 Document Revised: 04/15/2011 Document Reviewed: 11/30/2007 [employer] Patient Information 2012 [employer]. 

## 2011-10-13 ENCOUNTER — Emergency Department (HOSPITAL_COMMUNITY)
Admission: EM | Admit: 2011-10-13 | Discharge: 2011-10-13 | Disposition: A | Discharge status: Home / Self Care | Payer: Medicare Other | Attending: Emergency Medicine | Admitting: Emergency Medicine

## 2011-10-13 ENCOUNTER — Emergency Department (HOSPITAL_COMMUNITY): Payer: Medicare Other

## 2011-10-13 ENCOUNTER — Encounter (HOSPITAL_COMMUNITY): Payer: Self-pay | Admitting: *Deleted

## 2011-10-13 DIAGNOSIS — IMO0002 Reserved for concepts with insufficient information to code with codable children: Secondary | ICD-10-CM | POA: Insufficient documentation

## 2011-10-13 DIAGNOSIS — S61419A Laceration without foreign body of unspecified hand, initial encounter: Secondary | ICD-10-CM

## 2011-10-13 DIAGNOSIS — W19XXXA Unspecified fall, initial encounter: Secondary | ICD-10-CM

## 2011-10-13 DIAGNOSIS — S0091XA Abrasion of unspecified part of head, initial encounter: Secondary | ICD-10-CM

## 2011-10-13 DIAGNOSIS — S8002XA Contusion of left knee, initial encounter: Secondary | ICD-10-CM

## 2011-10-13 DIAGNOSIS — S8000XA Contusion of unspecified knee, initial encounter: Secondary | ICD-10-CM | POA: Insufficient documentation

## 2011-10-13 DIAGNOSIS — W108XXA Fall (on) (from) other stairs and steps, initial encounter: Secondary | ICD-10-CM | POA: Insufficient documentation

## 2011-10-13 DIAGNOSIS — S61409A Unspecified open wound of unspecified hand, initial encounter: Secondary | ICD-10-CM | POA: Insufficient documentation

## 2011-10-13 DIAGNOSIS — S0990XA Unspecified injury of head, initial encounter: Secondary | ICD-10-CM | POA: Insufficient documentation

## 2011-10-13 MED ORDER — TETANUS-DIPHTH-ACELL PERTUSSIS 5-2.5-18.5 LF-MCG/0.5 IM SUSP
0.5000 mL | Freq: Once | INTRAMUSCULAR | Status: AC
  Administered 2011-10-13: 0.5 mL via INTRAMUSCULAR
  Filled 2011-10-13: qty 0.5

## 2011-10-13 NOTE — ED Notes (Signed)
Large hematoma with abrasion left forehead, abrasion to nose, small lac to left hand, bruising to left anterior wrist..  Also c/o left neck pain so C-Collar was applied.

## 2011-10-13 NOTE — ED Provider Notes (Signed)
History     CSN: [id number]  Arrival date & time 10/13/11  1212   First MD Initiated Contact with Patient 10/13/11 1305      Chief Complaint  Patient presents with  . Fall  . Head Injury    denies LOC    (Consider location/radiation/quality/duration/timing/severity/associated sxs/prior treatment) HPI  Pt presents POV with wife. Pt was at neighbors and was going down the steps that were short and he missed one and fell landing on concrete patio. Fell onto his left side. Denies LOC. States his glasses got scratched and bent. Has laceration on his left hand and abrasion on his left knee. Has minimal pain and refuses pain meds. Denies nausea, vomiting, blurred vision, numbness or weakness in extremities.  PCP Dr Altheimer Cardiologist Dr Rex Kras Urologist Dr Jasmine December Ophthalmologist Dr Marina Gravel  Past Medical History  Diagnosis Date  . Diabetes mellitus   . Hypertension   . Pacemaker   . Coronary artery disease     Past Surgical History  Procedure Date  . Pacemaker insertion     av paced  . Coronary artery bypass graft     No family history on file.  History  Substance Use Topics  . Smoking status: Former Research scientist (life sciences)  . Smokeless tobacco: Not on file  . Alcohol Use: No  lives with wife Lives at home  Last tetanus over 10 years  Review of Systems  All other systems reviewed and are negative.    Allergies  Review of patient's allergies indicates no known allergies.  Home Medications   Current Outpatient Rx  Name Route Sig Dispense Refill  . ASPIRIN 325 MG PO TABS Oral Take 325 mg by mouth every 7 (seven) days. On Wednesday    . ASPIRIN EC 81 MG PO TBEC Oral Take 81 mg by mouth daily. Daily EXCEPT Wednesday    . ATORVASTATIN CALCIUM 20 MG PO TABS Oral Take 20 mg by mouth daily.    Marland Kitchen VITAMIN D PO Oral Take 10,000 Units by mouth every 7 (seven) days. On Friday    . GLIMEPIRIDE 4 MG PO TABS Oral Take 4 mg by mouth daily before breakfast.    . METOPROLOL SUCCINATE ER  50 MG PO TB24 Oral Take 25-50 mg by mouth 2 (two) times daily. Take with or immediately following a meal.    . PRESERVISION AREDS PO CAPS Oral Take 1 capsule by mouth 2 (two) times daily.    Marland Kitchen POLYETHYLENE GLYCOL 3350 PO PACK Oral Take 17 g by mouth daily. To prevent constipation    . SITAGLIPTIN PHOSPHATE 100 MG PO TABS Oral Take 100 mg by mouth daily.    Marland Kitchen VALSARTAN-HYDROCHLOROTHIAZIDE 160-12.5 MG PO TABS Oral Take 1 tablet by mouth daily.      BP 154/77  Pulse 90  Temp(Src) 97.6 F (36.4 C) (Oral)  Resp 19  Wt 190 lb (86.183 kg)  SpO2 97%  Vital signs normal    Physical Exam  Constitutional: He is oriented to person, place, and time. He appears well-developed and well-nourished.  Non-toxic appearance. He does not appear ill. No distress.  HENT:  Head: Normocephalic.    Right Ear: External ear normal.  Left Ear: External ear normal.  Nose: Nose normal. No mucosal edema or rhinorrhea.  Mouth/Throat: Oropharynx is clear and moist and mucous membranes are normal. No dental abscesses or uvula swelling.       Patient has moderate swelling and abrasion to his forehead near the midline into the left of  midline, he has an abrasion on the bridge of his nose that is not actively bleeding. He has tenderness in the same areas.  Eyes: Conjunctivae and EOM are normal. Pupils are equal, round, and reactive to light.  Neck: Full passive range of motion without pain.       Patient was placed in cervical collar by nursing staff  Cardiovascular: Normal rate, regular rhythm and normal heart sounds.  Exam reveals no gallop and no friction rub.   No murmur heard. Pulmonary/Chest: Effort normal and breath sounds normal. No respiratory distress. He has no wheezes. He has no rhonchi. He has no rales. He exhibits no tenderness and no crepitus.  Abdominal: Soft. Normal appearance and bowel sounds are normal. He exhibits no distension. There is no tenderness. There is no rebound and no guarding.    Musculoskeletal: Normal range of motion. He exhibits no edema and no tenderness.       Arms:      Moves all extremities well. No pain to flexion or extension at his hips or knees. Patient is noted to have superficial abrasions with mild swelling of his anterior left knee. There is no joint effusion noted. Patient's noted to have bruising over the thenar eminence. He has a very small 3 mm laceration on the hyperthenar eminence. He has good range of motion of all his fingers.  Neurological: He is alert and oriented to person, place, and time. He has normal strength. No cranial nerve deficit.  Skin: Skin is warm, dry and intact. No rash noted. No erythema. No pallor.  Psychiatric: He has a normal mood and affect. His speech is normal and behavior is normal. His mood appears not anxious.    ED Course  Procedures (including critical care time)   Medications  TDaP (BOOSTRIX) injection 0.5 mL (0.5 mL Intramuscular Given 10/13/11 1347)   Pt refused pain medications   LACERATION REPAIR Performed by: Rolland Porter L Authorized by: Janice Norrie Consent: Verbal consent obtained. Risks and benefits: risks, benefits and alternatives were discussed Consent given by: patient Patient identity confirmed: provided demographic data Prepped and Draped in normal sterile fashion Wound explored  Laceration Location: Hyperthenar eminence of left hand  Laceration Length:  1.5 cm  No Foreign Bodies seen or palpated  Anesthesia: local infiltration  Local anesthetic: lidocaine 2  Anesthetic total: 4 l  Irrigation method: syringe Amount of cleaning: standard  Skin closure:4-0 nylon Number of sutures: 3  Technique: simple interrupted  Patient tolerance: Patient tolerated the procedure well with no immediate complications.   Ct Head Wo Contrast CtCervical Spine Wo Contrast Ct Maxillofacial Wo Cm  10/13/2011  *RADIOLOGY REPORT*  Clinical Data:  Status post fall with forehead contusion and facial  injury.  Neck pain.  CT HEAD WITHOUT CONTRAST CT MAXILLOFACIAL WITHOUT CONTRAST CT CERVICAL SPINE WITHOUT CONTRAST  Technique:  Multidetector CT imaging of the head, cervical spine, and maxillofacial structures were performed using the standard protocol without intravenous contrast. Multiplanar CT image reconstructions of the cervical spine and maxillofacial structures were also generated.  Comparison:  Chest CT 12/09/2006.  CT HEAD  Findings: There is focal soft tissue swelling in the left frontal scalp.  No underlying calvarial fracture is demonstrated.  The visualized paranasal sinuses are clear.  There is no evidence of acute intracranial hemorrhage, mass lesion, brain edema or extra-axial fluid collection.  Mild prominence of the ventricles and subarachnoid spaces is consistent with age appropriate atrophy.  IMPRESSION:  1.  No acute intracranial or calvarial findings.  2.  Left frontal scalp soft tissue swelling.  CT MAXILLOFACIAL  Findings:  There is no evidence of acute facial fracture.  There is a 1.7 cm cystic lesion within the midline of the maxilla associated with an unerupted tooth. This extends into the inferior aspect of the nasal cavity.  There is also a focal interruption of the alveolar ridge.  Otherwise, this is well circumscribed and appears longstanding.  The paranasal sinuses are clear without air fluid levels.  The mastoids and middle ears are clear.  There is no evidence of orbital hematoma.  IMPRESSION:  1.  No evidence of acute facial fracture or orbital hematoma. 2.  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  CT CERVICAL SPINE  Findings:   There is straightening of the usual cervical lordosis without focal angulation or listhesis.  There is multilevel ankylosis.  The vertebral bodies appear ankylosed from C4-C7.  The left sided facet joints are likely ankylosed from C3-C6.  There is no evidence of acute fracture.  Ossification of the ligamentum nucha is noted.  No acute soft  tissue findings are identified.  There is asymmetric enlargement of the right thyroid lobe with a 2.6 cm low density lesion on image 68. This appears enlarged compared with prior chest CT.  IMPRESSION:  1.  No evidence of acute cervical spine fracture or traumatic subluxation. 2.  Multilevel spondylosis and ankylosis. 3.  Enlarging right thyroid nodule, potentially neoplastic. Thyroid ultrasound correlation recommended.  Original Report Authenticated By: Vivia Ewing, M.D.   Dg Knee Complete 4 Views Left  10/13/2011  *RADIOLOGY REPORT*  Clinical Data: [place] and injured left knee, anterior pain.  LEFT KNEE - COMPLETE 4+ VIEW  Comparison: None.  Findings: No evidence of acute fracture or dislocation.  Severe medial compartment joint space narrowing, moderate patellofemoral compartment joint space narrowing, and mild medial compartment joint space narrowing.  Enthesophytes at the insertion of the quadriceps tendon and patellar tendon on the patella.  No visible joint effusion.  Femoropopliteal atherosclerosis.  IMPRESSION: No acute osseous abnormality.  Osteoarthritis.  Original Report Authenticated By: Deniece Portela, M.D.   Dg Hand Complete Left  10/13/2011  *RADIOLOGY REPORT*  Clinical Data: [place] and injured left hand.  LEFT HAND - COMPLETE 3+ VIEW  Comparison: None.  Findings: No evidence of acute fracture or dislocation.  No opaque foreign bodies.  Joint space narrowing and associated hypertrophic spurring involving IP joints of multiple fingers, worst involving the IP joint of the thumb.  Mild osteopenia.  IMPRESSION: No acute osseous abnormality.  Mild osteoarthritis.  Original Report Authenticated By: Deniece Portela, M.D.   Ct Maxillofacial Wo Cm   1. Fall   2. Abrasion of head   3. Laceration of hand   4. Contusion of knee, left     Plan discharge  Rolland Porter, MD, FACEP   MDM          Janice Norrie, MD 10/13/11 [phone]

## 2011-10-13 NOTE — ED Notes (Addendum)
Pt states "was at my neighbor's house and just missed a step"; pt presents with hematoma to left forehead, abrasions noted to left forehead and nose, laceration to left anterior hand and abrasion to right anterior hand

## 2011-10-13 NOTE — Discharge Instructions (Signed)
Clean the wounds daily with soap and water with a washcloth, pat dry with a clean towel afterwards. Use triple antibiotic ointment without neomycin in it on the wounds daily until they are healed. The sutures in the left hand need to be removed in 10-12 days. That can be done at Dr. Keane [facility] office or at the [facility] urgent care. Return to the emergency department if your wounds get infected (increased redness, swelling, worsening pain, drainage pus, see red streak running up your hand, fever) or if you have any problems listed on the head injury sheet, call 911 to go to [facility] for evaluation

## 2011-10-25 ENCOUNTER — Emergency Department (HOSPITAL_COMMUNITY)
Admission: EM | Admit: 2011-10-25 | Discharge: 2011-10-25 | Discharge status: Against Medical Advice | Payer: Medicare Other | Attending: Emergency Medicine | Admitting: Emergency Medicine

## 2011-10-26 ENCOUNTER — Emergency Department (HOSPITAL_COMMUNITY)
Admission: EM | Admit: 2011-10-26 | Discharge: 2011-10-26 | Disposition: A | Discharge status: Home / Self Care | Payer: Medicare Other | Source: Home / Self Care | Attending: Emergency Medicine | Admitting: Emergency Medicine

## 2011-10-26 ENCOUNTER — Encounter (HOSPITAL_COMMUNITY): Payer: Self-pay | Admitting: Emergency Medicine

## 2011-10-26 DIAGNOSIS — Z4802 Encounter for removal of sutures: Secondary | ICD-10-CM

## 2011-10-26 NOTE — ED Provider Notes (Signed)
History     CSN: [id number]  Arrival date & time 10/26/11  1140   First MD Initiated Contact with Patient 10/26/11 1337      Chief Complaint  Patient presents with  . Suture / Staple Removal    (Consider location/radiation/quality/duration/timing/severity/associated sxs/prior treatment) Patient is a 78 y.o. male presenting with suture removal. The history is provided by the patient.  Suture / Staple Removal  The sutures were placed 11 to 14 days ago. Treatments since wound repair include antibiotic ointment use. There has been no drainage from the wound. There is no redness present. There is no swelling present. The pain has no pain. He has no difficulty moving the affected extremity or digit.    Past Medical History  Diagnosis Date  . Diabetes mellitus   . Hypertension   . Pacemaker   . Coronary artery disease     Past Surgical History  Procedure Date  . Pacemaker insertion     av paced  . Coronary artery bypass graft     History reviewed. No pertinent family history.  History  Substance Use Topics  . Smoking status: Former Research scientist (life sciences)  . Smokeless tobacco: Not on file  . Alcohol Use: No      Review of Systems  Constitutional: Negative for fever and chills.  Skin: Positive for wound. Negative for color change.    Allergies  Review of patient's allergies indicates no known allergies.  Home Medications   Current Outpatient Rx  Name Route Sig Dispense Refill  . ASPIRIN 325 MG PO TABS Oral Take 325 mg by mouth every 7 (seven) days. On Wednesday    . ASPIRIN EC 81 MG PO TBEC Oral Take 81 mg by mouth daily. Daily EXCEPT Wednesday    . ATORVASTATIN CALCIUM 20 MG PO TABS Oral Take 20 mg by mouth daily.    Marland Kitchen VITAMIN D PO Oral Take 10,000 Units by mouth every 7 (seven) days. On Friday    . GLIMEPIRIDE 4 MG PO TABS Oral Take 4 mg by mouth daily before breakfast.    . METOPROLOL SUCCINATE ER 50 MG PO TB24 Oral Take 25-50 mg by mouth 2 (two) times daily. Take with or  immediately following a meal.    . PRESERVISION AREDS PO CAPS Oral Take 1 capsule by mouth 2 (two) times daily.    Marland Kitchen POLYETHYLENE GLYCOL 3350 PO PACK Oral Take 17 g by mouth daily. To prevent constipation    . SITAGLIPTIN PHOSPHATE 100 MG PO TABS Oral Take 100 mg by mouth daily.    Marland Kitchen VALSARTAN-HYDROCHLOROTHIAZIDE 160-12.5 MG PO TABS Oral Take 1 tablet by mouth daily.      BP 142/73  Pulse 70  Temp 97.5 F (36.4 C) (Oral)  Resp 18  SpO2 97%  Physical Exam  Constitutional: He appears well-developed and well-nourished. No distress.  Pulmonary/Chest: Effort normal.  Musculoskeletal:       Hands: Skin: Skin is warm and dry.    ED Course  SUTURE REMOVAL Date/Time: 10/26/2011 1:40 PM Performed by: Carvel Getting Authorized by: Philipp Deputy C Consent: Verbal consent obtained. Consent given by: patient Body area: upper extremity Location details: left hand Wound Appearance: clean Sutures Removed: 3 Patient tolerance: Patient tolerated the procedure well with no immediate complications.   (including critical care time)  Labs Reviewed - No data to display No results found.   1. Visit for suture removal       MDM  Carvel Getting, NP 10/26/11 1341

## 2011-10-26 NOTE — Discharge Instructions (Signed)
Suture Removal You have had your sutures (stitches) removed today. This means your wound has healed well enough to take out your stitches. Be careful to protect the wound area over the next several weeks. An injury this area could cause the cut to split open again. It usually takes 1-2 years for a scar to get its full strength and loose its redness. For wounds that heal slowly, tapes may be applied to reinforce the skin for several days after the stitches are removed. You may allow the sutured area to get wet. Topical antibiotics (antibiotics you put on your skin) are not usually needed at this point. Applying vitamin E oil and aloe vera ointments may help the wound heal faster and stronger. Some scars form extra pigment with exposure to sunlight during the first 6-12 months after repair. This can be prevented by using a sun block (SPF 15-30) on the affected area. Call your doctor if you have any concerns about your injury. Call right away if you have any evidence of wound infection such as increased pain, drainage, redness, or swelling. Document Released: 06/03/2004 Document Revised: 04/15/2011 Document Reviewed: 02/16/2008 [facility] Patient Information 2012 [place].

## 2011-10-26 NOTE — ED Provider Notes (Signed)
Medical screening examination/treatment/procedure(s) were performed by non-physician practitioner and as supervising physician I was immediately available for consultation/collaboration.  Philipp Deputy, M.D.   Harden Mo, MD 10/26/11 1359

## 2011-10-26 NOTE — ED Notes (Signed)
Pt here for suture removal to left hans s/p laceration with repair 10/13/11 @ [facility] ER.INCISION INTACT,WELL APPROX WITH SCABBING.DENIES PAIN OR DRAINAGE.USING POLYSPORIN

## 2012-08-17 ENCOUNTER — Other Ambulatory Visit (HOSPITAL_COMMUNITY): Payer: Self-pay | Admitting: Cardiovascular Disease

## 2012-08-17 DIAGNOSIS — I472 Ventricular tachycardia: Secondary | ICD-10-CM

## 2012-08-21 ENCOUNTER — Ambulatory Visit (HOSPITAL_COMMUNITY)
Admission: RE | Admit: 2012-08-21 | Discharge: 2012-08-21 | Disposition: A | Discharge status: Home / Self Care | Payer: Medicare Other | Source: Ambulatory Visit | Attending: Cardiovascular Disease | Admitting: Cardiovascular Disease

## 2012-08-21 DIAGNOSIS — I472 Ventricular tachycardia, unspecified: Secondary | ICD-10-CM | POA: Insufficient documentation

## 2012-08-21 DIAGNOSIS — I4729 Other ventricular tachycardia: Secondary | ICD-10-CM | POA: Insufficient documentation

## 2012-08-21 NOTE — Progress Notes (Signed)
[facility]   2D echo completed 08/21/2012.   Jamison Neighbor, RDCS

## 2012-09-09 ENCOUNTER — Encounter: Payer: Self-pay | Admitting: *Deleted

## 2012-10-28 ENCOUNTER — Encounter: Payer: Self-pay | Admitting: Cardiovascular Disease

## 2013-01-10 ENCOUNTER — Other Ambulatory Visit: Payer: Self-pay | Admitting: Cardiovascular Disease

## 2013-01-10 ENCOUNTER — Ambulatory Visit (INDEPENDENT_AMBULATORY_CARE_PROVIDER_SITE_OTHER): Payer: Medicare Other | Admitting: Cardiovascular Disease

## 2013-01-10 ENCOUNTER — Encounter: Payer: Self-pay | Admitting: Cardiovascular Disease

## 2013-01-10 VITALS — BP 122/60 | Ht 68.0 in | Wt 194.8 lb

## 2013-01-10 DIAGNOSIS — I442 Atrioventricular block, complete: Secondary | ICD-10-CM

## 2013-01-10 DIAGNOSIS — I498 Other specified cardiac arrhythmias: Secondary | ICD-10-CM

## 2013-01-10 DIAGNOSIS — I455 Other specified heart block: Secondary | ICD-10-CM

## 2013-01-10 DIAGNOSIS — E785 Hyperlipidemia, unspecified: Secondary | ICD-10-CM

## 2013-01-10 DIAGNOSIS — I2510 Atherosclerotic heart disease of native coronary artery without angina pectoris: Secondary | ICD-10-CM

## 2013-01-10 DIAGNOSIS — R001 Bradycardia, unspecified: Secondary | ICD-10-CM

## 2013-01-10 DIAGNOSIS — E119 Type 2 diabetes mellitus without complications: Secondary | ICD-10-CM

## 2013-01-10 DIAGNOSIS — Z950 Presence of cardiac pacemaker: Secondary | ICD-10-CM

## 2013-01-10 DIAGNOSIS — I10 Essential (primary) hypertension: Secondary | ICD-10-CM

## 2013-01-10 LAB — PACEMAKER DEVICE OBSERVATION
ATRIAL PACING PM: 91
VENTRICULAR PACING PM: 100

## 2013-01-10 NOTE — Assessment & Plan Note (Signed)
He is now 11 years status post bypass surgery is completely asymptomatic despite a very healthy active lifestyle. His most recent functional study was a treadmill Myoview in September 2011 without any evidence of ischemia.

## 2013-01-10 NOTE — Patient Instructions (Addendum)
Your physician recommends that you schedule a follow-up appointment in: 3 months for a pacer check, and 12 months for a routine office visit

## 2013-01-10 NOTE — Assessment & Plan Note (Signed)
Good control on multiple agents

## 2013-01-10 NOTE — Progress Notes (Signed)
Patient ID: [name] Powell, male   DOB: 1922/12/23, 79 y.o.   MRN: [id number]    Reason for office visit Followup CAD, coronary risk factors, complete heart block, pacemaker  [name] Powell is now 79 years old, roughly 11 years status post bypass surgery and is pacemaker dependent (both sinus node arrest and complete heart block) now 6 years status post pacemaker implantation. He has well treated hypertension and hyperlipidemia and diabetes mellitus not requiring insulin.  He denies any complaints. He is very active. He walks 2 miles a day. He outpaces his wife was also very healthy and fit. Denies dizziness or syncope.   His A1c is mildly elevated at 7.7%, but his lipid profile parameters are satisfactory. His creatinine is borderline high at 1.4%.    No Known Allergies  Current Outpatient Prescriptions  Medication Sig Dispense Refill  . aspirin 325 MG tablet Take 325 mg by mouth every 7 (seven) days. On Wednesday      . aspirin EC 81 MG tablet Take 81 mg by mouth daily. Daily EXCEPT Wednesday      . atorvastatin (LIPITOR) 20 MG tablet Take 20 mg by mouth daily.      . Cholecalciferol (VITAMIN D PO) Take 10,000 Units by mouth every 7 (seven) days. On Friday      . glimepiride (AMARYL) 4 MG tablet Take 4 mg by mouth daily before breakfast.      . metoprolol succinate (TOPROL-XL) 50 MG 24 hr tablet Take 25-50 mg by mouth 2 (two) times daily. Take 1 tablet in the AM and 1/2 in the PM      . Multiple Vitamins-Minerals (PRESERVISION AREDS) CAPS Take 1 capsule by mouth 2 (two) times daily.      . polyethylene glycol (MIRALAX / GLYCOLAX) packet Take 17 g by mouth daily. To prevent constipation      . sitaGLIPtin (JANUVIA) 100 MG tablet Take 100 mg by mouth daily.      . valsartan-hydrochlorothiazide (DIOVAN-HCT) 160-12.5 MG per tablet Take 1 tablet by mouth daily.       No current facility-administered medications for this visit.    Past Medical History  Diagnosis Date  . Diabetes mellitus     . Hypertension   . Pacemaker 12/12/2006    Medtronic adapta  . Coronary artery disease   . 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 CHB (complete heart block)   . Dyslipidemia   . H/O prostate cancer     Past Surgical History  Procedure Laterality Date  . Pacemaker insertion  12/12/2006    Medtronic adapta  . Coronary artery bypass graft  09/04/2001    LIMA to LAD,SVG to diagonal,SVG to ramus intermedius,SVG to PDA  . Prostate surgery  2001    cancer  . Nm myoview ltd  01/09/2010    no ischemia    No family history on file.  History   Social History  . Marital Status: Married    Spouse Name: N/A    Number of Children: N/A  . Years of Education: N/A   Occupational History  . Not on file.   Social History Main Topics  . Smoking status: Former Smoker    Quit date: 05/14/1962  . Smokeless tobacco: Not on file  . Alcohol Use: Yes     Comment: seldom  . Drug Use: No  . Sexual Activity: Not on file   Other Topics Concern  .  Not on file   Social History Narrative  . No narrative on file    Review of systems: The patient specifically denies any chest pain at rest or with exertion, dyspnea at rest or with exertion, orthopnea, paroxysmal nocturnal dyspnea, syncope, palpitations, focal neurological deficits, intermittent claudication, lower extremity edema, unexplained weight gain, cough, hemoptysis or wheezing.  The patient also denies abdominal pain, nausea, vomiting, dysphagia, diarrhea, constipation, polyuria, polydipsia, dysuria, hematuria, frequency, urgency, abnormal bleeding or bruising, fever, chills, unexpected weight changes, mood swings, change in skin or hair texture, change in voice quality, auditory or visual problems, allergic reactions or rashes, new musculoskeletal complaints other than usual "aches and pains".   PHYSICAL EXAM BP 122/60  Ht 5\' 8"  (1.727 m)  Wt 194 lb 12.8 oz (88.361 kg)  BMI 29.63  kg/m2  General: Alert, oriented x3, no distress Head: no evidence of trauma, PERRL, EOMI, no exophtalmos or lid lag, no myxedema, no xanthelasma; normal ears, nose and oropharynx Neck: normal jugular venous pulsations and no hepatojugular reflux; brisk carotid pulses without delay and no carotid bruits Chest: clear to auscultation, no signs of consolidation by percussion or palpation, normal fremitus, symmetrical and full respiratory excursions; tenotomy scar, healthy left subclavian pacemaker site Cardiovascular: normal position and quality of the apical impulse, regular rhythm, normal first and second heart sounds, no murmurs, rubs or gallops Abdomen: no tenderness or distention, no masses by palpation, no abnormal pulsatility or arterial bruits, normal bowel sounds, no hepatosplenomegaly Extremities: no clubbing, cyanosis or edema; 2+ radial, ulnar and brachial pulses bilaterally; 2+ right femoral, posterior tibial and dorsalis pedis pulses; 2+ left femoral, posterior tibial and dorsalis pedis pulses; no subclavian or femoral bruits Neurological: grossly nonfocal   EKG: 100% atrioventricular sequential paced  Lipid Panel  Every 2014 total cholesterol 117 triglycerides 129 HDL 30 LDL 56  BMET    Component Value Date/Time   NA 142 08/05/2011 0132   K 3.9 08/05/2011 0132   CL 104 08/05/2011 0132   CO2 25 12/13/2006 0525   GLUCOSE 143* 08/05/2011 0132   BUN 30* 08/05/2011 0132   CREATININE 1.40* 08/05/2011 0132   CALCIUM 8.7 12/13/2006 0525   GFRNONAA >60 12/13/2006 0525   GFRAA  Value: >60        The eGFR has been calculated using the MDRD equation. This calculation has not been validated in all clinical 12/13/2006 0525     ASSESSMENT AND PLAN Pacemaker dependent - Medtronic He has no evidence of any intrinsic atrial or ventricular activity and is pacemaker dependent. Device function is normal and the lead parameters are excellent. No changes are made to device settings. Of note when we try to  increase his lower rate limit to 70 beats per minute he did not feel so well. He prefers a rate of 60 beats per minute. He will need every 3 month followup in the clinic or remote monitoring. For the time being he prefers not to undergo remote monitoring. I told him that this is a good option in the future and may provide earlier diagnostic interventions.  CHB (complete heart block)    Sinus arrest    Dyslipidemia Satisfactory recent lipid profile  DM2 (diabetes mellitus, type 2) Fair control  CAD s/p CABG 2003 He is now 11 years status post bypass surgery is completely asymptomatic despite a very healthy active lifestyle. His most recent functional study was a treadmill Myoview in September 2011 without any evidence of ischemia.   Orders Placed This Encounter  Procedures  . EKG 12-Lead   No orders of the defined types were placed in this encounter.    Holli Humbles, MD, [place] [facility] [place] [phone] office [phone] pager

## 2013-01-10 NOTE — Assessment & Plan Note (Signed)
Satisfactory recent lipid profile

## 2013-01-10 NOTE — Assessment & Plan Note (Signed)
He has no evidence of any intrinsic atrial or ventricular activity and is pacemaker dependent. Device function is normal and the lead parameters are excellent. No changes are made to device settings. Of note when we try to increase his lower rate limit to 70 beats per minute he did not feel so well. He prefers a rate of 60 beats per minute. He will need every 3 month followup in the clinic or remote monitoring. For the time being he prefers not to undergo remote monitoring. I told him that this is a good option in the future and may provide earlier diagnostic interventions.

## 2013-01-10 NOTE — Progress Notes (Signed)
In office pacemaker interrogation. Normal device function. No changes made this session. 

## 2013-01-10 NOTE — Assessment & Plan Note (Signed)
Fair control.

## 2013-05-08 ENCOUNTER — Ambulatory Visit (INDEPENDENT_AMBULATORY_CARE_PROVIDER_SITE_OTHER): Payer: Medicare Other | Admitting: *Deleted

## 2013-05-08 DIAGNOSIS — I455 Other specified heart block: Secondary | ICD-10-CM

## 2013-05-08 DIAGNOSIS — I442 Atrioventricular block, complete: Secondary | ICD-10-CM

## 2013-05-08 LAB — PACEMAKER DEVICE OBSERVATION

## 2013-05-08 NOTE — Patient Instructions (Signed)
Your physician recommends that you schedule a follow-up appointment in: 3 months with the device clinic

## 2013-05-08 NOTE — Progress Notes (Signed)
Pacemaker check in clinic. Normal device function. Thresholds, sensing, impedances consistent with previous measurements. Device programmed to maximize longevity. 18 mode switches (<0.1%)---all <30 sec, no EGMs. 4 high ventricular rates noted---max dur. 6 sec, Max V 183---episodes appear to be NSVT. Device programmed at appropriate safety margins. Histogram distribution appropriate for patient activity level. Device programmed to optimize intrinsic conduction. Estimated longevity 4.5 years. Patient will follow up with [facility] in 3 months.

## 2013-05-16 LAB — MDC_IDC_ENUM_SESS_TYPE_INCLINIC
Battery Impedance: 583 Ohm
Battery Remaining Longevity: 4.5
Battery Voltage: 2.77 V
Brady Statistic AP VP Percent: 91.7 %
Brady Statistic AP VS Percent: 0.1 % — CL
Brady Statistic AS VP Percent: 8.2 %
Brady Statistic AS VS Percent: 0.1 % — CL
Lead Channel Impedance Value: 505 Ohm
Lead Channel Impedance Value: 544 Ohm
Lead Channel Pacing Threshold Amplitude: 0.5 V
Lead Channel Pacing Threshold Amplitude: 0.75 V
Lead Channel Pacing Threshold Pulse Width: 0.4 ms
Lead Channel Pacing Threshold Pulse Width: 0.4 ms
Lead Channel Sensing Intrinsic Amplitude: 2 mV
Lead Channel Setting Pacing Amplitude: 1.875
Lead Channel Setting Pacing Amplitude: 2.5 V
Lead Channel Setting Pacing Pulse Width: 0.4 ms
Lead Channel Setting Sensing Sensitivity: 2 mV

## 2013-08-24 ENCOUNTER — Ambulatory Visit (INDEPENDENT_AMBULATORY_CARE_PROVIDER_SITE_OTHER): Payer: Medicare Other | Admitting: Cardiovascular Disease

## 2013-08-24 ENCOUNTER — Encounter: Payer: Self-pay | Admitting: Cardiovascular Disease

## 2013-08-24 VITALS — BP 116/70 | HR 66 | Resp 20 | Ht 68.0 in | Wt 195.2 lb

## 2013-08-24 DIAGNOSIS — E119 Type 2 diabetes mellitus without complications: Secondary | ICD-10-CM

## 2013-08-24 DIAGNOSIS — I2510 Atherosclerotic heart disease of native coronary artery without angina pectoris: Secondary | ICD-10-CM

## 2013-08-24 DIAGNOSIS — I442 Atrioventricular block, complete: Secondary | ICD-10-CM

## 2013-08-24 DIAGNOSIS — I455 Other specified heart block: Secondary | ICD-10-CM

## 2013-08-24 DIAGNOSIS — I10 Essential (primary) hypertension: Secondary | ICD-10-CM

## 2013-08-24 DIAGNOSIS — Z950 Presence of cardiac pacemaker: Secondary | ICD-10-CM

## 2013-08-24 DIAGNOSIS — E785 Hyperlipidemia, unspecified: Secondary | ICD-10-CM

## 2013-08-24 LAB — PACEMAKER DEVICE OBSERVATION

## 2013-08-24 NOTE — Patient Instructions (Addendum)
Your physician recommends that you schedule a follow-up appointment in: 3 months with Dr.Croitoru for an office visit and pacemaker check.

## 2013-08-25 NOTE — Progress Notes (Signed)
Patient ID: [name] Powell, male   DOB: Jan 04, 1923, 80 y.o.   MRN: [id number]     Reason for office visit CAD s/p CABG, complete heart block, pacemaker  [name] Powell has entered his 89th year of life and 12 years have passed since his bypass surgery. 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 His dual-chamber device (Medtronic) was implanted in August 2008. He has treated coronary risk factors (hypertension, hyperlipidemia, diabetes mellitus type 2 on oral antidiabetics). He remains active and walks daily, although for short distances than in the past. He doesn't have as much energy, and seems to lack the motivation. He denies chest pain, dyspnea, edema or dizziness.  Interrogation of his pacemaker shows normal function he paces the ventricle 100% of the time and paces the atrium nearly 90% of the time. There have been no episodes of ventricular high rates. He had one 7 minute episode of what appears to be atrial flutter. He was asymptomatic. The episode occurred in January.  All his coronary risk factors are generally well addressed but he has gained some weight and now is borderline obese.   No Known Allergies  Current Outpatient Prescriptions  Medication Sig Dispense Refill  . aspirin 325 MG tablet Take 325 mg by mouth every 7 (seven) days. On Wednesday      . aspirin EC 81 MG tablet Take 81 mg by mouth daily. Daily EXCEPT Wednesday      . atorvastatin (LIPITOR) 20 MG tablet Take 20 mg by mouth daily.      . Cholecalciferol (VITAMIN D PO) Take 10,000 Units by mouth every 7 (seven) days. On Friday      . glimepiride (AMARYL) 4 MG tablet Take 4 mg by mouth daily before breakfast.      . metoprolol succinate (TOPROL-XL) 50 MG 24 hr tablet Take 25-50 mg by mouth 2 (two) times daily. Take 1 tablet in the AM and 1/2 in the PM      . Multiple Vitamins-Minerals (PRESERVISION AREDS) CAPS Take 1 capsule by mouth 2 (two) times daily.      . polyethylene glycol  (MIRALAX / GLYCOLAX) packet Take 17 g by mouth daily. To prevent constipation      . sitaGLIPtin (JANUVIA) 100 MG tablet Take 100 mg by mouth daily.      . valsartan-hydrochlorothiazide (DIOVAN-HCT) 160-12.5 MG per tablet Take 1 tablet by mouth daily.       No current facility-administered medications for this visit.    Past Medical History  Diagnosis Date  . Diabetes mellitus   . Hypertension   . Pacemaker 12/12/2006    Medtronic adapta  . Coronary artery disease   . 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 CHB (complete heart block)   . Dyslipidemia   . H/O prostate cancer     Past Surgical History  Procedure Laterality Date  . Pacemaker insertion  12/12/2006    Medtronic adapta  . Coronary artery bypass graft  09/04/2001    LIMA to LAD,SVG to diagonal,SVG to ramus intermedius,SVG to PDA  . Prostate surgery  2001    cancer  . Nm myoview ltd  01/09/2010    no ischemia    No family history on file.  History   Social History  . Marital Status: Married    Spouse Name: N/A    Number of Children: N/A  . Years of Education:  N/A   Occupational History  . Not on file.   Social History Main Topics  . Smoking status: Former Smoker    Quit date: 05/14/1962  . Smokeless tobacco: Not on file  . Alcohol Use: Yes     Comment: seldom  . Drug Use: No  . Sexual Activity: Not on file   Other Topics Concern  . Not on file   Social History Narrative  . No narrative on file    Review of systems: The patient specifically denies any chest pain at rest or with exertion, dyspnea at rest or with exertion, orthopnea, paroxysmal nocturnal dyspnea, syncope, palpitations, focal neurological deficits, intermittent claudication, lower extremity edema, unexplained weight gain, cough, hemoptysis or wheezing.  The patient also denies abdominal pain, nausea, vomiting, dysphagia, diarrhea, constipation, polyuria, polydipsia, dysuria, hematuria,  frequency, urgency, abnormal bleeding or bruising, fever, chills, unexpected weight changes, mood swings, change in skin or hair texture, change in voice quality, auditory or visual problems, allergic reactions or rashes, new musculoskeletal complaints other than usual "aches and pains".   PHYSICAL EXAM BP 116/70  Pulse 66  Resp 20  Ht 5\' 8"  (1.727 m)  Wt 195 lb 3.2 oz (88.542 kg)  BMI 29.69 kg/m2 General: Alert, oriented x3, no distress  Head: no evidence of trauma, PERRL, EOMI, no exophtalmos or lid lag, no myxedema, no xanthelasma; normal ears, nose and oropharynx  Neck: normal jugular venous pulsations and no hepatojugular reflux; brisk carotid pulses without delay and no carotid bruits  Chest: clear to auscultation, no signs of consolidation by percussion or palpation, normal fremitus, symmetrical and full respiratory excursions; tenotomy scar, healthy left subclavian pacemaker site  Cardiovascular: normal position and quality of the apical impulse, regular rhythm, normal first and second heart sounds, no murmurs, rubs or gallops  Abdomen: no tenderness or distention, no masses by palpation, no abnormal pulsatility or arterial bruits, normal bowel sounds, no hepatosplenomegaly  Extremities: no clubbing, cyanosis or edema; 2+ radial, ulnar and brachial pulses bilaterally; 2+ right femoral, posterior tibial and dorsalis pedis pulses; 2+ left femoral, posterior tibial and dorsalis pedis pulses; no subclavian or femoral bruits  Neurological: grossly nonfocal  EKG: Atrial ventricular paced  Lipid Panel  2014 total cholesterol 117 triglycerides 129 HDL 30 LDL 56  BMET    Component Value Date/Time   NA 142 08/05/2011 0132   K 3.9 08/05/2011 0132   CL 104 08/05/2011 0132   CO2 25 12/13/2006 0525   GLUCOSE 143* 08/05/2011 0132   BUN 30* 08/05/2011 0132   CREATININE 1.40* 08/05/2011 0132   CALCIUM 8.7 12/13/2006 0525   GFRNONAA >60 12/13/2006 0525   GFRAA  Value: >60        The eGFR has been  calculated using the MDRD equation. This calculation has not been validated in all clinical 12/13/2006 0525     ASSESSMENT AND PLAN  Pacemaker dependent - Medtronic  He has no evidence of any intrinsic atrial or ventricular activity and is pacemaker dependent. Device function is normal and the lead parameters are excellent. We'll make a slight increase in his rate response setting. Of note when we try to increase his lower rate limit to 70 beats per minute he did not feel so well. He prefers a rate of 60 beats per minute. He will need every 3 month followup in the clinic or remote monitoring. For the time being he still prefers not to undergo remote monitoring. I told him that this is a good option in the  future and may provide earlier diagnostic interventions.   CHB (complete heart block) and Sinus arrest   Dyslipidemia  Satisfactory recent lipid profile   DM2 (diabetes mellitus, type 2)  Fair control. Encouraged him to lose weight, avoid arches and sweets.  CAD s/p CABG 2003  He is now 11 years status post bypass surgery is completely asymptomatic despite a very healthy active lifestyle. His most recent functional study was a treadmill Myoview in September 2011 without any evidence of ischemia.   Patient Instructions  Your physician recommends that you schedule a follow-up appointment in: 3 months with Dr.Tameko Halder for an office visit and pacemaker check.     Orders Placed This Encounter  Procedures  . EKG 12-Lead    Betzaira Mentel  [name], MD, [facility] [phone] office [phone] pager

## 2013-08-27 ENCOUNTER — Ambulatory Visit (INDEPENDENT_AMBULATORY_CARE_PROVIDER_SITE_OTHER): Payer: Medicare Other | Admitting: *Deleted

## 2013-08-27 ENCOUNTER — Telehealth: Payer: Self-pay | Admitting: *Deleted

## 2013-08-27 DIAGNOSIS — I442 Atrioventricular block, complete: Secondary | ICD-10-CM

## 2013-08-27 DIAGNOSIS — R001 Bradycardia, unspecified: Secondary | ICD-10-CM

## 2013-08-27 DIAGNOSIS — I498 Other specified cardiac arrhythmias: Secondary | ICD-10-CM

## 2013-08-27 DIAGNOSIS — I455 Other specified heart block: Secondary | ICD-10-CM

## 2013-08-27 LAB — MDC_IDC_ENUM_SESS_TYPE_INCLINIC
Battery Impedance: 712 Ohm
Battery Remaining Longevity: 50 mo
Battery Voltage: 2.77 V
Brady Statistic AP VP Percent: 99 %
Date Time Interrogation Session: 20150420152042
MDC IDC MSMT LEADCHNL RA IMPEDANCE VALUE: 505 Ohm
MDC IDC MSMT LEADCHNL RV IMPEDANCE VALUE: 515 Ohm
MDC IDC SET LEADCHNL RA PACING AMPLITUDE: 1.5 V
MDC IDC SET LEADCHNL RV PACING AMPLITUDE: 2.5 V
MDC IDC SET LEADCHNL RV PACING PULSEWIDTH: 0.4 ms
MDC IDC SET LEADCHNL RV SENSING SENSITIVITY: 2 mV
MDC IDC STAT BRADY AP VS PERCENT: 0 %
MDC IDC STAT BRADY AS VP PERCENT: 1 %
MDC IDC STAT BRADY AS VS PERCENT: 0 %

## 2013-08-27 LAB — PACEMAKER DEVICE OBSERVATION

## 2013-08-27 NOTE — Telephone Encounter (Signed)
Pacemaker adjusted during Fridays visit.  Walked the mall Saturday and developed chest tightness - relieved w/slowing down.  No problems yesterday while walking in the house.  Today went back to the mall but walked slower and had only mild tightness.  Wants his pacer set back to original setting - feels better.  Contacted [phone] at [facility]. She will see him for adjustments.  Patient voiced understanding and will go to the [facility] location.

## 2013-08-30 LAB — MDC_IDC_ENUM_SESS_TYPE_INCLINIC
Battery Remaining Longevity: 4.5
Battery Voltage: 2.77 V
Brady Statistic AS VP Percent: 2.5 %
Lead Channel Impedance Value: 498 Ohm
Lead Channel Impedance Value: 549 Ohm
Lead Channel Pacing Threshold Amplitude: 0.5 V
Lead Channel Pacing Threshold Pulse Width: 0.4 ms
Lead Channel Setting Pacing Amplitude: 1.875
Lead Channel Setting Sensing Sensitivity: 2 mV
MDC IDC MSMT BATTERY IMPEDANCE: 685 Ohm
MDC IDC MSMT LEADCHNL RA PACING THRESHOLD AMPLITUDE: 0.5 V
MDC IDC MSMT LEADCHNL RA PACING THRESHOLD PULSEWIDTH: 0.4 ms
MDC IDC MSMT LEADCHNL RA SENSING INTR AMPL: 1.4 mV
MDC IDC SET LEADCHNL RV PACING AMPLITUDE: 2.5 V
MDC IDC SET LEADCHNL RV PACING PULSEWIDTH: 0.4 ms
MDC IDC STAT BRADY AP VP PERCENT: 97.5 %
MDC IDC STAT BRADY AP VS PERCENT: 0.1 % — AB
MDC IDC STAT BRADY AS VS PERCENT: 0.1 % — AB

## 2013-08-31 NOTE — Progress Notes (Signed)
Patient presents to the office with c/o CP with RR adjustment (N/C). RR threshold was reprogrammed to its original setting of "MED/LOW". Encouraged patient to contact office if CP continues. Patient to follow up as scheduled.

## 2013-09-20 ENCOUNTER — Other Ambulatory Visit: Payer: Self-pay | Admitting: *Deleted

## 2013-09-20 MED ORDER — METOPROLOL SUCCINATE ER 50 MG PO TB24: ORAL_TABLET | ORAL | Status: DC

## 2013-09-20 NOTE — Telephone Encounter (Signed)
Rx was sent to pharmacy electronically. 

## 2013-10-26 ENCOUNTER — Encounter: Payer: Self-pay | Admitting: *Deleted

## 2013-10-26 ENCOUNTER — Encounter: Payer: Self-pay | Admitting: Cardiovascular Disease

## 2013-10-26 ENCOUNTER — Ambulatory Visit (INDEPENDENT_AMBULATORY_CARE_PROVIDER_SITE_OTHER): Payer: Medicare Other | Admitting: *Deleted

## 2013-10-26 VITALS — BP 116/68 | HR 64 | Ht 68.0 in | Wt 194.6 lb

## 2013-10-26 DIAGNOSIS — R002 Palpitations: Secondary | ICD-10-CM

## 2013-10-26 NOTE — Progress Notes (Signed)
Patient walked in stating he had palpitations after he walked this AM at the mall.  He has noticed he can't walk as far before he gets tired.  Today he checked his pulse and noticed it was irregular.  His wife brought him straight to the office.  He has not symptoms of chest pain, SOB or dizziness.  Pacemaker interrogation done and notified Shakila.  Upon review by [facility]. No abnormalities seen.  Explained results to patient and wife.  Told him to decrease caffeine and make sure he stays hydrated.  Also, told him to slow down a little during his walks to see if this makes a difference.  He thinks his Metoprolol needs to be increased.  Told patient I will show this info the Dr. Loletha Grayer on his return and it is up to him whether there will be any med changes.  Instructed his wife if he has any episodes that do not resolve on there own he needs to go the the ER.

## 2013-11-21 ENCOUNTER — Telehealth: Payer: Self-pay | Admitting: Cardiovascular Disease

## 2013-11-21 NOTE — Telephone Encounter (Signed)
Appt made for 7-21 @ 12:00 w/MC

## 2013-11-21 NOTE — Telephone Encounter (Signed)
[name] Powell is stating that every times he walks his pacemaker is skipping .Marland Kitchen Please Call   Thanks

## 2013-11-27 ENCOUNTER — Ambulatory Visit (INDEPENDENT_AMBULATORY_CARE_PROVIDER_SITE_OTHER): Payer: Medicare Other | Admitting: Cardiovascular Disease

## 2013-11-27 ENCOUNTER — Encounter: Payer: Self-pay | Admitting: Cardiovascular Disease

## 2013-11-27 VITALS — BP 130/78 | HR 78 | Resp 16 | Ht 68.0 in | Wt 196.2 lb

## 2013-11-27 DIAGNOSIS — I10 Essential (primary) hypertension: Secondary | ICD-10-CM

## 2013-11-27 DIAGNOSIS — Z950 Presence of cardiac pacemaker: Secondary | ICD-10-CM

## 2013-11-27 DIAGNOSIS — I455 Other specified heart block: Secondary | ICD-10-CM

## 2013-11-27 DIAGNOSIS — R0602 Shortness of breath: Secondary | ICD-10-CM

## 2013-11-27 DIAGNOSIS — I442 Atrioventricular block, complete: Secondary | ICD-10-CM

## 2013-11-27 DIAGNOSIS — I2510 Atherosclerotic heart disease of native coronary artery without angina pectoris: Secondary | ICD-10-CM

## 2013-11-27 LAB — MDC_IDC_ENUM_SESS_TYPE_INCLINIC
Battery Remaining Longevity: 49 mo
Brady Statistic AS VP Percent: 2 %
Brady Statistic AS VS Percent: 0 %
Date Time Interrogation Session: 20150721160256
Lead Channel Impedance Value: 492 Ohm
Lead Channel Setting Pacing Amplitude: 2.5 V
Lead Channel Setting Pacing Pulse Width: 0.4 ms
MDC IDC MSMT BATTERY IMPEDANCE: 789 Ohm
MDC IDC MSMT BATTERY VOLTAGE: 2.76 V
MDC IDC MSMT LEADCHNL RV IMPEDANCE VALUE: 570 Ohm
MDC IDC SET LEADCHNL RA PACING AMPLITUDE: 1.5 V
MDC IDC SET LEADCHNL RV SENSING SENSITIVITY: 2.8 mV
MDC IDC STAT BRADY AP VP PERCENT: 97 %
MDC IDC STAT BRADY AP VS PERCENT: 0 %

## 2013-11-27 MED ORDER — METOPROLOL SUCCINATE ER 50 MG PO TB24: 50.0000 mg | ORAL_TABLET | Freq: Two times a day (BID) | ORAL | Status: DC

## 2013-11-27 MED ORDER — VALSARTAN 160 MG PO TABS: 160.0000 mg | ORAL_TABLET | Freq: Every day | ORAL | Status: DC

## 2013-11-27 NOTE — Patient Instructions (Signed)
STOP the Valsartan/HCTZ.  START Diovan (Valsartan) 160mg  daily.  Increase Metoprolol ER 50mg  to one tablet twice a day.  Dr. Sallyanne Kuster recommends that you schedule a follow-up appointment in: One month

## 2013-11-28 ENCOUNTER — Encounter: Payer: Self-pay | Admitting: Cardiovascular Disease

## 2013-11-28 NOTE — Progress Notes (Signed)
Patient ID: [name] Powell, male   DOB: 1923/02/21, 80 y.o.   MRN: [id number]     Reason for office visit Dyspnea and fatigue, pacemaker check, HTN  Mr. Rabideau has noticed a reduction in stamina: he has abbreviated his usual walking distance due to fatigue (more than dyspnea). Has also noted palpitations when he walks, that subside with rest. He has sinus node arrest and complete heart block and is pacemaker dependent. When we walked him a couple of laps around the office, the degree of sensor driven rate response appeared appropriate. Increased PVC frequency was noted after exercise. Otherwise, no arrhythmia an normal pacemaker function.  12 years have passed since his CABG surgery. His last nuclear study in 2011 was normal. Echo in April 2014 was normal. 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 His dual-chamber device (Medtronic) was implanted in August 2008. He has had very brief and asymptomatic atrial flutter (< 7 minutes) recorded by his device January 2015. He is very sensitive to programming changes. He feels better at alower rate programmed at 60 rather than 70. He has treated coronary risk factors (hypertension, hyperlipidemia, diabetes mellitus type 2 on oral antidiabetics).    No Known Allergies  Current Outpatient Prescriptions  Medication Sig Dispense Refill  . aspirin 325 MG tablet Take 325 mg by mouth every 7 (seven) days. On Wednesday      . aspirin EC 81 MG tablet Take 81 mg by mouth daily. Daily EXCEPT Wednesday      . atorvastatin (LIPITOR) 20 MG tablet Take 20 mg by mouth daily.      . Cholecalciferol (VITAMIN D PO) Take 10,000 Units by mouth every 7 (seven) days. On Friday      . glimepiride (AMARYL) 4 MG tablet Take 4 mg by mouth daily before breakfast.      . metoprolol succinate (TOPROL-XL) 50 MG 24 hr tablet Take 1 tablet (50 mg total) by mouth 2 (two) times daily. Take one tablet twice a  day.  180 tablet  3  . Multiple Vitamins-Minerals (PRESERVISION AREDS) CAPS Take 1 capsule by mouth 2 (two) times daily.      . polyethylene glycol (MIRALAX / GLYCOLAX) packet Take 17 g by mouth daily. To prevent constipation      . sitaGLIPtin (JANUVIA) 100 MG tablet Take 100 mg by mouth daily.      . valsartan (DIOVAN) 160 MG tablet Take 1 tablet (160 mg total) by mouth daily.  30 tablet  0   No current facility-administered medications for this visit.    Past Medical History  Diagnosis Date  . Diabetes mellitus   . Hypertension   . Pacemaker 12/12/2006    Medtronic adapta  . Coronary artery disease   . 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 CHB (complete heart block)   . Dyslipidemia   . H/O prostate cancer     Past Surgical History  Procedure Laterality Date  . Pacemaker insertion  12/12/2006    Medtronic adapta  . Coronary artery bypass graft  09/04/2001    LIMA to LAD,SVG to diagonal,SVG to ramus intermedius,SVG to PDA  . Prostate surgery  2001    cancer  . Nm myoview ltd  01/09/2010    no ischemia    No family history on file.  History   Social History  . Marital  Status: Married    Spouse Name: N/A    Number of Children: N/A  . Years of Education: N/A   Occupational History  . Not on file.   Social History Main Topics  . Smoking status: Former Smoker    Quit date: 05/14/1962  . Smokeless tobacco: Not on file  . Alcohol Use: Yes     Comment: seldom  . Drug Use: No  . Sexual Activity: Not on file   Other Topics Concern  . Not on file   Social History Narrative  . No narrative on file    Review of systems: Exertional fatigue and dyspnea and palpitations. No angina and no complaints at rest. The patient specifically denies any chest pain at rest or with exertion, dyspnea at rest, orthopnea, paroxysmal nocturnal dyspnea, syncope, focal neurological deficits, intermittent claudication, lower extremity  edema, unexplained weight gain, cough, hemoptysis or wheezing.  The patient also denies abdominal pain, nausea, vomiting, dysphagia, diarrhea, constipation, polyuria, polydipsia, dysuria, hematuria, frequency, urgency, abnormal bleeding or bruising, fever, chills, unexpected weight changes, mood swings, change in skin or hair texture, change in voice quality, auditory or visual problems, allergic reactions or rashes, new musculoskeletal complaints other than usual "aches and pains".   PHYSICAL EXAM BP 130/78  Pulse 78  Resp 16  Ht $R'5\' 8"'be$  (1.727 m)  Wt 196 lb 3.2 oz (88.996 kg)  BMI 29.84 kg/m2 General: Alert, oriented x3, no distress  Head: no evidence of trauma, PERRL, EOMI, no exophtalmos or lid lag, no myxedema, no xanthelasma; normal ears, nose and oropharynx  Neck: normal jugular venous pulsations and no hepatojugular reflux; brisk carotid pulses without delay and no carotid bruits  Chest: clear to auscultation, no signs of consolidation by percussion or palpation, normal fremitus, symmetrical and full respiratory excursions; tenotomy scar, healthy left subclavian pacemaker site  Cardiovascular: normal position and quality of the apical impulse, regular rhythm, normal first and second heart sounds, no murmurs, rubs or gallops  Abdomen: no tenderness or distention, no masses by palpation, no abnormal pulsatility or arterial bruits, normal bowel sounds, no hepatosplenomegaly  Extremities: no clubbing, cyanosis or edema; 2+ radial, ulnar and brachial pulses bilaterally; 2+ right femoral, posterior tibial and dorsalis pedis pulses; 2+ left femoral, posterior tibial and dorsalis pedis pulses; no subclavian or femoral bruits  Neurological: grossly nonfocal   EKG: Atrial ventricular sequential paced  Lipid Panel 2014 total cholesterol 117 triglycerides 129 HDL 30 LDL 56  BMET    Component Value Date/Time   NA 142 08/05/2011 0132   K 3.9 08/05/2011 0132   CL 104 08/05/2011 0132   CO2 25  12/13/2006 0525   GLUCOSE 143* 08/05/2011 0132   BUN 30* 08/05/2011 0132   CREATININE 1.40* 08/05/2011 0132   CALCIUM 8.7 12/13/2006 0525   GFRNONAA >60 12/13/2006 0525   GFRAA  Value: >60        The eGFR has been calculated using the MDRD equation. This calculation has not been validated in all clinical 12/13/2006 0525     ASSESSMENT AND PLAN  His reduced exercise tolerance should be investigated further. Will check an echo and increase the beta blocker dose. If EF remains normal and symptoms improve, consider a nuclear perfusion study. If EF is low, regional wall motion abnormalities are seen or exertional symptoms worsen, proceed to coronary and graft angiography. In anticipation of possible cath and to avoid excessive drop in BP with more beta blocker, DC diuretic.  Patient Instructions  STOP the Valsartan/HCTZ.  START  Diovan (Valsartan) 160mg  daily.  Increase Metoprolol ER 50mg  to one tablet twice a day.  Dr. Sallyanne Kuster recommends that you schedule a follow-up appointment in: One month       Orders Placed This Encounter  Procedures  . Implantable device check  . EKG 12-Lead  . 2D Echocardiogram without contrast   Meds ordered this encounter  Medications  . metoprolol succinate (TOPROL-XL) 50 MG 24 hr tablet    Sig: Take 1 tablet (50 mg total) by mouth 2 (two) times daily. Take one tablet twice a day.    Dispense:  180 tablet    Refill:  3  . DISCONTD: valsartan (DIOVAN) 160 MG tablet    Sig: Take 1 tablet (160 mg total) by mouth daily.    Dispense:  90 tablet    Refill:  3  . valsartan (DIOVAN) 160 MG tablet    Sig: Take 1 tablet (160 mg total) by mouth daily.    Dispense:  30 tablet    Refill:  0    Madason Rauls  [name], MD, [facility] [phone] office [phone] pager

## 2013-12-11 ENCOUNTER — Telehealth: Payer: Self-pay | Admitting: *Deleted

## 2013-12-11 ENCOUNTER — Ambulatory Visit (HOSPITAL_COMMUNITY)
Admission: RE | Admit: 2013-12-11 | Discharge: 2013-12-11 | Disposition: A | Discharge status: Home / Self Care | Payer: Medicare Other | Source: Ambulatory Visit | Attending: Internal Medicine | Admitting: Internal Medicine

## 2013-12-11 DIAGNOSIS — I359 Nonrheumatic aortic valve disorder, unspecified: Secondary | ICD-10-CM | POA: Insufficient documentation

## 2013-12-11 DIAGNOSIS — I2510 Atherosclerotic heart disease of native coronary artery without angina pectoris: Secondary | ICD-10-CM

## 2013-12-11 DIAGNOSIS — R0602 Shortness of breath: Secondary | ICD-10-CM

## 2013-12-11 DIAGNOSIS — I379 Nonrheumatic pulmonary valve disorder, unspecified: Secondary | ICD-10-CM

## 2013-12-11 DIAGNOSIS — R0600 Dyspnea, unspecified: Secondary | ICD-10-CM

## 2013-12-11 NOTE — Telephone Encounter (Signed)
Echo results called to patient.  He feels a bit better but still SOB.  Exercise Myoview scheduled.  Patient voiced understanding.

## 2013-12-11 NOTE — Telephone Encounter (Signed)
Message copied by Tressa Busman on Tue Dec 11, 2013  3:16 PM ------      Message from: [name]      Created: Tue Dec 11, 2013  2:18 PM       Echo still shows normal EF, but with wall motion abnormalities, aortic valve OK. Is he feeling any better? Would recommend a treadmill Myoview study. Please schedule, Pamala Hurry. Thanks ------

## 2013-12-11 NOTE — Progress Notes (Signed)
2D Echocardiogram Complete.  12/11/2013   Nini Cavan, RDCS  

## 2013-12-14 ENCOUNTER — Telehealth (HOSPITAL_COMMUNITY): Payer: Self-pay | Admitting: *Deleted

## 2013-12-19 ENCOUNTER — Telehealth (HOSPITAL_COMMUNITY): Payer: Self-pay

## 2013-12-19 NOTE — Telephone Encounter (Signed)
Encounter complete. 

## 2013-12-21 ENCOUNTER — Ambulatory Visit (HOSPITAL_COMMUNITY)
Admission: RE | Admit: 2013-12-21 | Discharge: 2013-12-21 | Disposition: A | Discharge status: Home / Self Care | Payer: Medicare Other | Source: Ambulatory Visit | Attending: Cardiology | Admitting: Cardiology

## 2013-12-21 ENCOUNTER — Telehealth: Payer: Self-pay | Admitting: *Deleted

## 2013-12-21 DIAGNOSIS — R0602 Shortness of breath: Secondary | ICD-10-CM | POA: Insufficient documentation

## 2013-12-21 DIAGNOSIS — R0989 Other specified symptoms and signs involving the circulatory and respiratory systems: Secondary | ICD-10-CM

## 2013-12-21 DIAGNOSIS — I10 Essential (primary) hypertension: Secondary | ICD-10-CM | POA: Insufficient documentation

## 2013-12-21 DIAGNOSIS — I4892 Unspecified atrial flutter: Secondary | ICD-10-CM | POA: Diagnosis not present

## 2013-12-21 DIAGNOSIS — E119 Type 2 diabetes mellitus without complications: Secondary | ICD-10-CM | POA: Diagnosis not present

## 2013-12-21 DIAGNOSIS — R5381 Other malaise: Secondary | ICD-10-CM | POA: Insufficient documentation

## 2013-12-21 DIAGNOSIS — R002 Palpitations: Secondary | ICD-10-CM | POA: Diagnosis not present

## 2013-12-21 DIAGNOSIS — I2510 Atherosclerotic heart disease of native coronary artery without angina pectoris: Secondary | ICD-10-CM | POA: Insufficient documentation

## 2013-12-21 DIAGNOSIS — R42 Dizziness and giddiness: Secondary | ICD-10-CM | POA: Insufficient documentation

## 2013-12-21 DIAGNOSIS — R0600 Dyspnea, unspecified: Secondary | ICD-10-CM

## 2013-12-21 DIAGNOSIS — Z87891 Personal history of nicotine dependence: Secondary | ICD-10-CM | POA: Diagnosis not present

## 2013-12-21 DIAGNOSIS — Z8249 Family history of ischemic heart disease and other diseases of the circulatory system: Secondary | ICD-10-CM | POA: Diagnosis not present

## 2013-12-21 DIAGNOSIS — E669 Obesity, unspecified: Secondary | ICD-10-CM | POA: Insufficient documentation

## 2013-12-21 DIAGNOSIS — R0609 Other forms of dyspnea: Secondary | ICD-10-CM

## 2013-12-21 DIAGNOSIS — I509 Heart failure, unspecified: Secondary | ICD-10-CM | POA: Insufficient documentation

## 2013-12-21 DIAGNOSIS — Z951 Presence of aortocoronary bypass graft: Secondary | ICD-10-CM | POA: Insufficient documentation

## 2013-12-21 DIAGNOSIS — R5383 Other fatigue: Secondary | ICD-10-CM

## 2013-12-21 MED ORDER — TECHNETIUM TC 99M SESTAMIBI GENERIC - CARDIOLITE
30.1000 | Freq: Once | INTRAVENOUS | Status: AC | PRN
  Administered 2013-12-21: 30.1 via INTRAVENOUS

## 2013-12-21 MED ORDER — TECHNETIUM TC 99M SESTAMIBI GENERIC - CARDIOLITE
10.5000 | Freq: Once | INTRAVENOUS | Status: AC | PRN
  Administered 2013-12-21: 11 via INTRAVENOUS

## 2013-12-21 NOTE — Telephone Encounter (Signed)
Husband is here this AM for a Nuclear Study.  She wanted to report that since he was taken off HCTZ he has been retaining fluid (feet,legs and face).  They just got back from the beach and ate out a lot.  He also does not elevate his feet unless told to do so.  Instructed to decrease sodium and elevate feet when sitting.  SOB is about the same with activity.  Told wife Dr. Loletha Grayer will review the Nuc and advise if other testing is needed and any med changes.  Informed I will send this info to Dr. Loletha Grayer.  Wife voiced understanding.

## 2013-12-21 NOTE — Procedures (Addendum)
[name] [address] [name] [facility] [address] [id number]  Cardiology Nuclear Med Study  [name] Powell is a 80 y.o. male     MRN : [id number]     DOB: [birth year]  Procedure Date: 12/21/2013  Nuclear Med Background Indication for Stress Test:  Evaluation for Ischemia and Graft Patency History:  CAD;CABG X4-09/04/2001;PACER-12/12/2006-DUAL CHAMBER -AV paced;sinus arrest;atrial flutter CHF;Last NUC MPI on 01/09/2010-nonischemic;ECHO on 04/14/204-LV EF=70% Cardiac Risk Factors: Family History - CAD, History of Smoking, Hypertension, Lipids, NIDDM and Overweight  Symptoms:  Dizziness, DOE, Fatigue, Palpitations and SOB   Nuclear Pre-Procedure Caffeine/Decaff Intake:  9:00pm NPO After: 7:00am   IV Site: R Forearm  IV 0.9% NS with Angio Cath:  22g  Chest Size (in):  46"  IV Started by: Rolene Course, RN  Height: 5\' 8"  (1.727 m)  Cup Size: n/a  BMI:  Body mass index is 29.81 kg/(m^2). Weight:  196 lb (88.905 kg)   Tech Comments:  n/a    Nuclear Med Study 1 or 2 day study: 1 day  Stress Test Type:  Stress  Order Authorizing Provider:  [name], MD   Resting Radionuclide: Technetium 64m Sestamibi  Resting Radionuclide Dose: 10.5 mCi   Stress Radionuclide:  Technetium 47m Sestamibi  Stress Radionuclide Dose: 30.1 mCi           Stress Protocol Rest HR: 61 Stress HR: 126  Rest BP:151/82 Stress BP:179/90  Exercise Time (min): 3:43 METS: 4.70   Predicted Max HR: 131 bpm % Max HR: 96.18 bpm Rate Pressure Product: 22554  Dose of Adenosine (mg):  n/a Dose of Lexiscan: n/a mg  Dose of Atropine (mg): n/a Dose of Dobutamine: n/a mcg/kg/min (at max HR)  Stress Test Technologist: Mellody Memos, CCT Nuclear Technologist: Imagene Riches, CNMT   Rest Procedure:  Myocardial perfusion imaging was performed at rest 45 minutes following the intravenous administration of Technetium 56m Sestamibi. Stress Procedure:  The patient  performed treadmill exercise using a Bruce  Protocol for 3 minutes 43 seconds. The patient stopped due to extreme shortness of breath, wheezing and fatigue. Patient denied any chest pain.  There were no significant ST-T wave changes.  Technetium 20m Sestamibi was injected IV at peak exercise and myocardial perfusion imaging was performed after a brief delay.  Transient Ischemic Dilatation (Normal <1.22):  1.06  QGS EDV:  135 ml QGS ESV:  75 ml LV Ejection Fraction: 44%        Rest ECG: Ventricular pacing  Stress ECG: Uninteretable due to ventricular pacing  QPS Raw Data Images:  Acquisition technically good; LVE. Stress Images:  There is decreased uptake in the apex. Rest Images:  There is decreased uptake in the apex. Subtraction (SDS):  No evidence of ischemia.  Impression Exercise Capacity:  Poor exercise capacity. BP Response:  Normal blood pressure response. Clinical Symptoms:  There is dyspnea. ECG Impression:  EKG uninterpretable due to ventricular pacing. Comparison with Prior Nuclear Study: Compared to 01/09/10, no significant change.  Overall Impression:  Low risk stress nuclear study with a small, severe intensity, fixed apical defect consistent with prior apical infarct; no ischemia.  LV Wall Motion:  Apical akinesis.   Kirk Ruths, MD  12/21/2013 1:05 PM

## 2014-01-02 ENCOUNTER — Ambulatory Visit (INDEPENDENT_AMBULATORY_CARE_PROVIDER_SITE_OTHER): Payer: Medicare Other | Admitting: Cardiovascular Disease

## 2014-01-02 ENCOUNTER — Encounter: Payer: Self-pay | Admitting: Cardiovascular Disease

## 2014-01-02 VITALS — BP 140/76 | HR 70 | Resp 16 | Ht 68.0 in | Wt 197.9 lb

## 2014-01-02 DIAGNOSIS — Z950 Presence of cardiac pacemaker: Secondary | ICD-10-CM | POA: Diagnosis not present

## 2014-01-02 DIAGNOSIS — I5033 Acute on chronic diastolic (congestive) heart failure: Secondary | ICD-10-CM

## 2014-01-02 DIAGNOSIS — I455 Other specified heart block: Secondary | ICD-10-CM

## 2014-01-02 DIAGNOSIS — I442 Atrioventricular block, complete: Secondary | ICD-10-CM

## 2014-01-02 DIAGNOSIS — I2510 Atherosclerotic heart disease of native coronary artery without angina pectoris: Secondary | ICD-10-CM

## 2014-01-02 DIAGNOSIS — E785 Hyperlipidemia, unspecified: Secondary | ICD-10-CM

## 2014-01-02 DIAGNOSIS — I509 Heart failure, unspecified: Secondary | ICD-10-CM

## 2014-01-02 DIAGNOSIS — I10 Essential (primary) hypertension: Secondary | ICD-10-CM

## 2014-01-02 DIAGNOSIS — I25810 Atherosclerosis of coronary artery bypass graft(s) without angina pectoris: Secondary | ICD-10-CM

## 2014-01-02 DIAGNOSIS — I5032 Chronic diastolic (congestive) heart failure: Secondary | ICD-10-CM | POA: Insufficient documentation

## 2014-01-02 LAB — MDC_IDC_ENUM_SESS_TYPE_INCLINIC
Brady Statistic AP VS Percent: 0.5 %
Brady Statistic AS VP Percent: 9.7 %
Lead Channel Impedance Value: 491 Ohm
Lead Channel Impedance Value: 535 Ohm
Lead Channel Pacing Threshold Amplitude: 0.5 V
Lead Channel Pacing Threshold Amplitude: 0.5 V
Lead Channel Pacing Threshold Pulse Width: 0.4 ms
Lead Channel Sensing Intrinsic Amplitude: 1.4 mV
Lead Channel Setting Pacing Pulse Width: 0.4 ms
MDC IDC MSMT LEADCHNL RV PACING THRESHOLD PULSEWIDTH: 0.4 ms
MDC IDC SET LEADCHNL RA PACING AMPLITUDE: 1.5 V
MDC IDC SET LEADCHNL RV PACING AMPLITUDE: 2.5 V
MDC IDC SET LEADCHNL RV SENSING SENSITIVITY: 2.8 mV
MDC IDC STAT BRADY AP VP PERCENT: 89.8 %
MDC IDC STAT BRADY AS VS PERCENT: 0.1 % — AB

## 2014-01-02 MED ORDER — FUROSEMIDE 20 MG PO TABS: 20.0000 mg | ORAL_TABLET | Freq: Every day | ORAL | Status: DC

## 2014-01-02 NOTE — Progress Notes (Signed)
Patient ID: [name] Powell, male   DOB: 06/09/1922, 80 y.o.   MRN: [id number]     Reason for office visit CAD, followup nuclear stress test/echo, pacemaker check, complete heart block  [name] Powell returns after undergoing a nuclear stress test and echo, prompted by worsening shortness of breath and fatigue.   The nuclear study shows a fixed apical defect consistent with a scar in the distal LAD territory, similar to his last nuclear stress test. The ejection fraction was mildly depressed at 44%. There was no evidence of reversible ischemia. Conversely his echocardiogram shows an ejection fraction of 55% but also shows apical wall motion abnormality.  His dyspnea has improved substantially. It was associated with lower extremity edema and both occurred while he was at the beach probably consuming a higher sodium diet. The dyspnea and the edema resolved in parallel. Right now he is not taking any diuretics.  Pacemaker interrogation shows normal device function. He is pacemaker dependent due to complete heart block and also has virtually 100% atrial pacing due to marked sinus bradycardia/sinus arrest. On August 23 he had a 17-beat run of nonsustained ventricular tachycardia that was asymptomatic. Similar episodes have been recorded sporadically in the past. He seems to have an increased frequency of PVCs during exercise.  12 years have passed since his CABG surgery. His last nuclear study in 2011 was normal. Echo in April 2014 was normal. 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 His dual-chamber device (Medtronic) was implanted in August 2008. He has had very brief and asymptomatic atrial flutter (< 7 minutes) recorded by his device January 2015. He is very sensitive to programming changes. He feels better at alower rate programmed at 60 rather than 70. He has treated coronary risk factors (hypertension, hyperlipidemia,  diabetes mellitus type 2 on oral antidiabetics).     No Known Allergies  Current Outpatient Prescriptions  Medication Sig Dispense Refill  . aspirin 325 MG tablet Take 325 mg by mouth every 7 (seven) days. On Wednesday      . aspirin EC 81 MG tablet Take 81 mg by mouth daily. Daily EXCEPT Wednesday      . atorvastatin (LIPITOR) 20 MG tablet Take 20 mg by mouth daily.      . Cholecalciferol (VITAMIN D PO) Take 10,000 Units by mouth every 7 (seven) days. On Friday      . glimepiride (AMARYL) 4 MG tablet Take 4 mg by mouth daily before breakfast.      . metoprolol succinate (TOPROL-XL) 50 MG 24 hr tablet Take 1 tablet (50 mg total) by mouth 2 (two) times daily. Take one tablet twice a day.  180 tablet  3  . Multiple Vitamins-Minerals (PRESERVISION AREDS) CAPS Take 1 capsule by mouth 2 (two) times daily.      . polyethylene glycol (MIRALAX / GLYCOLAX) packet Take 17 g by mouth daily. To prevent constipation      . sitaGLIPtin (JANUVIA) 100 MG tablet Take 100 mg by mouth daily.      . valsartan (DIOVAN) 160 MG tablet Take 1 tablet (160 mg total) by mouth daily.  30 tablet  0  . furosemide (LASIX) 20 MG tablet Take 1 tablet (20 mg total) by mouth daily.  30 tablet  5   No current facility-administered medications for this visit.    Past Medical History  Diagnosis Date  . Diabetes mellitus   . Hypertension   .  Pacemaker 12/12/2006    Medtronic adapta  . Coronary artery disease   . 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 CHB (complete heart block)   . Dyslipidemia   . H/O prostate cancer     Past Surgical History  Procedure Laterality Date  . Pacemaker insertion  12/12/2006    Medtronic adapta  . Coronary artery bypass graft  09/04/2001    LIMA to LAD,SVG to diagonal,SVG to ramus intermedius,SVG to PDA  . Prostate surgery  2001    cancer  . Nm myoview ltd  01/09/2010    no ischemia    No family history on file.  History    Social History  . Marital Status: Married    Spouse Name: N/A    Number of Children: N/A  . Years of Education: N/A   Occupational History  . Not on file.   Social History Main Topics  . Smoking status: Former Smoker    Quit date: 05/14/1962  . Smokeless tobacco: Not on file  . Alcohol Use: Yes     Comment: seldom  . Drug Use: No  . Sexual Activity: Not on file   Other Topics Concern  . Not on file   Social History Narrative  . No narrative on file    Review of systems: Exertional fatigue and dyspnea and palpitations are all better. No angina and no complaints at rest.  The patient specifically denies any chest pain at rest or with exertion, dyspnea at rest, orthopnea, paroxysmal nocturnal dyspnea, syncope, focal neurological deficits, intermittent claudication, lower extremity edema, unexplained weight gain, cough, hemoptysis or wheezing.  The patient also denies abdominal pain, nausea, vomiting, dysphagia, diarrhea, constipation, polyuria, polydipsia, dysuria, hematuria, frequency, urgency, abnormal bleeding or bruising, fever, chills, unexpected weight changes, mood swings, change in skin or hair texture, change in voice quality, auditory or visual problems, allergic reactions or rashes, new musculoskeletal complaints other than usual "aches and pains".   PHYSICAL EXAM BP 140/76  Pulse 70  Resp 16  Ht $R'5\' 8"'SH$  (1.727 m)  Wt 197 lb 14.4 oz (89.767 kg)  BMI 30.10 kg/m2 General: Alert, oriented x3, no distress  Head: no evidence of trauma, PERRL, EOMI, no exophtalmos or lid lag, no myxedema, no xanthelasma; normal ears, nose and oropharynx  Neck: normal jugular venous pulsations and no hepatojugular reflux; brisk carotid pulses without delay and no carotid bruits  Chest: clear to auscultation, no signs of consolidation by percussion or palpation, normal fremitus, symmetrical and full respiratory excursions; tenotomy scar, healthy left subclavian pacemaker site   Cardiovascular: normal position and quality of the apical impulse, regular rhythm, normal first and paradoxically split second heart sounds, no murmurs, rubs or gallops  Abdomen: no tenderness or distention, no masses by palpation, no abnormal pulsatility or arterial bruits, normal bowel sounds, no hepatosplenomegaly  Extremities: no clubbing, cyanosis or edema; 2+ radial, ulnar and brachial pulses bilaterally; 2+ right femoral, posterior tibial and dorsalis pedis pulses; 2+ left femoral, posterior tibial and dorsalis pedis pulses; no subclavian or femoral bruits  Neurological: grossly nonfocal    EKG: AV sequential pacing  Lipid Panel  No results found for this basename: chol, trig, hdl, cholhdl, vldl, ldlcalc    BMET    Component Value Date/Time   NA 142 08/05/2011 0132   K 3.9 08/05/2011 0132   CL 104 08/05/2011 0132   CO2 25 12/13/2006 0525   GLUCOSE 143* 08/05/2011 0132  BUN 30* 08/05/2011 0132   CREATININE 1.40* 08/05/2011 0132   CALCIUM 8.7 12/13/2006 0525   GFRNONAA >60 12/13/2006 0525   GFRAA  Value: >60        The eGFR has been calculated using the MDRD equation. This calculation has not been validated in all clinical 12/13/2006 0525     ASSESSMENT AND PLAN  Retrospectively, it appears that Mr. Woelfel had an episode of heart failure exacerbation, probably related to increased sodium consumption while he was away. We increased his dose of beta blockers to help suppress ventricular ectopy. As mentioned he had an episode of nonsustained VT just a few days ago.  His nuclear study showed no evidence of reversible ischemia. He appears to have an area of scar in the distal LAD territory. All in all the risks of coronary angiography did not appear to be justified since he is now minimally symptomatic and did not have high risk findings on perfusion imaging.  I have asked him to weigh himself on a daily basis and gave him a prescription for furosemide 20 mg to take as needed only should he  experience increased weight, lower showed edema and worsening dyspnea. We'll continue to see him in the office every 3 months  No orders of the defined types were placed in this encounter.   Meds ordered this encounter  Medications  . furosemide (LASIX) 20 MG tablet    Sig: Take 1 tablet (20 mg total) by mouth daily.    Dispense:  30 tablet    Refill:  [place] Yasaman Kolek, MD, [facility] [phone] office [phone] pager

## 2014-01-02 NOTE — Patient Instructions (Signed)
Take Furosemide 20mg  as needed for swelling.  Dr. Sallyanne Kuster recommends that you schedule a follow-up appointment in: 3 months with Pacemaker check.

## 2014-01-15 ENCOUNTER — Encounter: Payer: Medicare Other | Admitting: Cardiovascular Disease

## 2014-02-06 ENCOUNTER — Ambulatory Visit: Payer: Medicare Other | Admitting: Cardiovascular Disease

## 2014-02-08 ENCOUNTER — Encounter: Payer: Self-pay | Admitting: Cardiovascular Disease

## 2014-03-28 ENCOUNTER — Other Ambulatory Visit: Payer: Self-pay | Admitting: Cardiovascular Disease

## 2014-03-28 ENCOUNTER — Encounter: Payer: Self-pay | Admitting: Cardiovascular Disease

## 2014-03-28 ENCOUNTER — Ambulatory Visit (INDEPENDENT_AMBULATORY_CARE_PROVIDER_SITE_OTHER): Payer: Medicare Other | Admitting: Cardiovascular Disease

## 2014-03-28 VITALS — BP 146/73 | HR 61 | Resp 16 | Ht 68.0 in | Wt 199.7 lb

## 2014-03-28 DIAGNOSIS — I4729 Other ventricular tachycardia: Secondary | ICD-10-CM

## 2014-03-28 DIAGNOSIS — I472 Ventricular tachycardia, unspecified: Secondary | ICD-10-CM

## 2014-03-28 DIAGNOSIS — Z79899 Other long term (current) drug therapy: Secondary | ICD-10-CM

## 2014-03-28 DIAGNOSIS — E1142 Type 2 diabetes mellitus with diabetic polyneuropathy: Secondary | ICD-10-CM

## 2014-03-28 DIAGNOSIS — I455 Other specified heart block: Secondary | ICD-10-CM

## 2014-03-28 DIAGNOSIS — I25810 Atherosclerosis of coronary artery bypass graft(s) without angina pectoris: Secondary | ICD-10-CM

## 2014-03-28 DIAGNOSIS — E785 Hyperlipidemia, unspecified: Secondary | ICD-10-CM

## 2014-03-28 DIAGNOSIS — I10 Essential (primary) hypertension: Secondary | ICD-10-CM

## 2014-03-28 DIAGNOSIS — Z951 Presence of aortocoronary bypass graft: Secondary | ICD-10-CM

## 2014-03-28 DIAGNOSIS — R002 Palpitations: Secondary | ICD-10-CM

## 2014-03-28 DIAGNOSIS — I5033 Acute on chronic diastolic (congestive) heart failure: Secondary | ICD-10-CM

## 2014-03-28 DIAGNOSIS — I442 Atrioventricular block, complete: Secondary | ICD-10-CM

## 2014-03-28 DIAGNOSIS — I509 Heart failure, unspecified: Secondary | ICD-10-CM

## 2014-03-28 DIAGNOSIS — I2510 Atherosclerotic heart disease of native coronary artery without angina pectoris: Secondary | ICD-10-CM

## 2014-03-28 DIAGNOSIS — Z950 Presence of cardiac pacemaker: Secondary | ICD-10-CM

## 2014-03-28 HISTORY — DX: Ventricular tachycardia: I47.2

## 2014-03-28 HISTORY — DX: Ventricular tachycardia, unspecified: I47.20

## 2014-03-28 HISTORY — DX: Other ventricular tachycardia: I47.29

## 2014-03-28 LAB — MDC_IDC_ENUM_SESS_TYPE_INCLINIC
Battery Impedance: 894 Ohm
Brady Statistic AP VP Percent: 91 %
Brady Statistic AP VS Percent: 0 %
Brady Statistic AS VS Percent: 0 %
Date Time Interrogation Session: 20151119104317
Lead Channel Pacing Threshold Amplitude: 0.75 V
Lead Channel Pacing Threshold Amplitude: 0.75 V
Lead Channel Pacing Threshold Pulse Width: 0.4 ms
Lead Channel Pacing Threshold Pulse Width: 0.4 ms
Lead Channel Setting Pacing Amplitude: 2.5 V
Lead Channel Setting Pacing Pulse Width: 0.4 ms
MDC IDC MSMT BATTERY REMAINING LONGEVITY: 44 mo
MDC IDC MSMT BATTERY VOLTAGE: 2.77 V
MDC IDC MSMT LEADCHNL RA IMPEDANCE VALUE: 464 Ohm
MDC IDC MSMT LEADCHNL RV IMPEDANCE VALUE: 533 Ohm
MDC IDC SET LEADCHNL RA PACING AMPLITUDE: 2.25 V
MDC IDC SET LEADCHNL RV SENSING SENSITIVITY: 2.8 mV
MDC IDC STAT BRADY AS VP PERCENT: 9 %

## 2014-03-28 MED ORDER — METOPROLOL SUCCINATE ER 50 MG PO TB24: ORAL_TABLET | ORAL | Status: DC

## 2014-03-28 NOTE — Patient Instructions (Addendum)
INCREASE Metoprolol to 100mg  in the AM and 50mg  in the PM.  A new Rx has been sent to [facility].  Your physician recommends that you return for lab work in: Today at [facility] lab.   Your physician recommends that you schedule a follow-up appointment in: 3 months with Dr.Croitoru + pacemaker check

## 2014-03-28 NOTE — Progress Notes (Signed)
Patient ID: [name] Powell, male   DOB: [birth year], 80 y.o.   MRN: [id number]     Reason for office visit CAD s/p CABG, diastolic HF, CHB and sinus node arrest s/p pacemaker  Mr. Record recently went on a trip to the beach and had some problems with ankle edema, that is responding well to daily dosing of furosemide. He has no other cardiac complaints and specifically denies angina, dizziness, palpitations or syncope. He has difficulty with his gait and feels unsteady at times and this appears to be at least in part secondary to neuropathy.  He has both sinus node arrest and complete heart block and paces the atrium 90% of the time and paces the ventricle 100% of the time. There is no detectable P wave detectable R-wave when the pacemaker is turned down. He is pacemaker dependent. Pacemaker interrogation shows normal device function with an estimated longevity of 3.5 years. Lead parameters are excellent.  The device did record a very lengthy episode of nonsustained ventricular tachycardia consisting of 20 beats (7 seconds) with a cycle length of about 330 ms, occurring on September 14 in the middle of the day. He does not recall any symptoms around that time.  His pacemaker has also recorded a roughly 30 minutes episode of paroxysmal atrial tachycardia/slow atrial flutter with a cycle length of approximately 350 ms. That arrhythmia occurred a week ago in the middle of the night and was also asymptomatic.  12 years have passed since his CABG surgery. His last nuclear study in 2011 was normal. Echo in April 2014 was normal. 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 His dual-chamber device (Medtronic) was implanted in August 2008. He has had very brief and asymptomatic atrial flutter (< 7 minutes) recorded by his device January 2015. 20 beats of nonsustained VT recorded in September 2015. 30 minute episode of paroxysmal atrial  tach in November 2015. He is very sensitive to programming changes. He feels better at lower rate: programmed at 60 rather than 70. He has treated coronary risk factors (hypertension, hyperlipidemia, diabetes mellitus type 2 on oral antidiabetics).   No Known Allergies  Current Outpatient Prescriptions  Medication Sig Dispense Refill  . aspirin 325 MG tablet Take 325 mg by mouth every 7 (seven) days. On Wednesday    . aspirin EC 81 MG tablet Take 81 mg by mouth daily. Daily EXCEPT Wednesday    . atorvastatin (LIPITOR) 20 MG tablet Take 20 mg by mouth daily.    . Cholecalciferol (VITAMIN D PO) Take 10,000 Units by mouth every 7 (seven) days. On Friday    . furosemide (LASIX) 20 MG tablet Take 1 tablet (20 mg total) by mouth daily. 30 tablet 5  . glimepiride (AMARYL) 4 MG tablet Take 4 mg by mouth daily before breakfast.    . metoprolol succinate (TOPROL-XL) 50 MG 24 hr tablet Take $RemoveBef'100mg'eESDMckyRS$  in the AM and $Remo'50mg'YgWrH$  in the PM 90 tablet 5  . Multiple Vitamins-Minerals (PRESERVISION AREDS) CAPS Take 1 capsule by mouth 2 (two) times daily.    . polyethylene glycol (MIRALAX / GLYCOLAX) packet Take 17 g by mouth daily. To prevent constipation    . sitaGLIPtin (JANUVIA) 100 MG tablet Take 100 mg by mouth daily.    . valsartan (DIOVAN) 160 MG tablet Take 1 tablet (160 mg total) by mouth daily. 30 tablet 0   No current facility-administered medications for this visit.  Past Medical History  Diagnosis Date  . Diabetes mellitus   . Hypertension   . Pacemaker 12/12/2006    Medtronic adapta  . Coronary artery disease   . 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 CHB (complete heart block)   . Dyslipidemia   . H/O prostate cancer     Past Surgical History  Procedure Laterality Date  . Pacemaker insertion  12/12/2006    Medtronic adapta  . Coronary artery bypass graft  09/04/2001    LIMA to LAD,SVG to diagonal,SVG to ramus intermedius,SVG to PDA  .  Prostate surgery  2001    cancer  . Nm myoview ltd  01/09/2010    no ischemia    No family history on file.  History   Social History  . Marital Status: Married    Spouse Name: N/A    Number of Children: N/A  . Years of Education: N/A   Occupational History  . Not on file.   Social History Main Topics  . Smoking status: Former Smoker    Quit date: 05/14/1962  . Smokeless tobacco: Not on file  . Alcohol Use: Yes     Comment: seldom  . Drug Use: No  . Sexual Activity: Not on file   Other Topics Concern  . Not on file   Social History Narrative    Review of systems: Mild ankle edema The patient specifically denies any chest pain at rest or with exertion, dyspnea at rest or with exertion, orthopnea, paroxysmal nocturnal dyspnea, syncope, focal neurological deficits, intermittent claudication, lower extremity edema, unexplained weight gain, cough, hemoptysis or wheezing.  The patient also denies abdominal pain, nausea, vomiting, dysphagia, diarrhea, constipation, polyuria, polydipsia, dysuria, hematuria, frequency, urgency, abnormal bleeding or bruising, fever, chills, unexpected weight changes, mood swings, change in skin or hair texture, change in voice quality, auditory or visual problems, allergic reactions or rashes, new musculoskeletal complaints other than usual "aches and pains".  PHYSICAL EXAM BP 146/73 mmHg  Pulse 61  Resp 16  Ht $R'5\' 8"'Pc$  (1.727 m)  Wt 199 lb 11.2 oz (90.583 kg)  BMI 30.37 kg/m2 General: Alert, oriented x3, no distress  Head: no evidence of trauma, PERRL, EOMI, no exophtalmos or lid lag, no myxedema, no xanthelasma; normal ears, nose and oropharynx  Neck: normal jugular venous pulsations and no hepatojugular reflux; brisk carotid pulses without delay and no carotid bruits  Chest: clear to auscultation, no signs of consolidation by percussion or palpation, normal fremitus, symmetrical and full respiratory excursions; tenotomy scar, healthy left  subclavian pacemaker site  Cardiovascular: normal position and quality of the apical impulse, regular rhythm, normal first and second heart sounds, no murmurs, rubs or gallops  Abdomen: no tenderness or distention, no masses by palpation, no abnormal pulsatility or arterial bruits, normal bowel sounds, no hepatosplenomegaly  Extremities: no clubbing, cyanosis or edema; 2+ radial, ulnar and brachial pulses bilaterally; 2+ right femoral, posterior tibial and dorsalis pedis pulses; 2+ left femoral, posterior tibial and dorsalis pedis pulses; no subclavian or femoral bruits  Neurological: grossly nonfocal   EKG: Atrial ventricular sequential paced  BMET    Component Value Date/Time   NA 142 08/05/2011 0132   K 3.9 08/05/2011 0132   CL 104 08/05/2011 0132   CO2 25 12/13/2006 0525   GLUCOSE 143* 08/05/2011 0132   BUN 30* 08/05/2011 0132   CREATININE 1.40* 08/05/2011 0132   CALCIUM 8.7 12/13/2006 0525  GFRNONAA >60 12/13/2006 0525   GFRAA  12/13/2006 0525    >60        The eGFR has been calculated using the MDRD equation. This calculation has not been validated in all clinical     ASSESSMENT AND PLAN  Nonsustained VT He has had similar sustained VT recorded in the past, albeit shorter. Will increase his beta blocker slightly. Check electrolytes.  CAD s/p CABG Recent evaluation has not shown evidence of active ischemia. Small apical infarct, no acute ischemia. On statin. Check lipids.  CHF with mildly reduced EF 44% His recent lower extremity edema was likely related to increased sodium intake during his vacation. It is responding very well and quickly to diuretics. Continue beta blockers and angiotensin receptor blocker.  Orders Placed This Encounter  Procedures  . Basic metabolic panel  . Magnesium  . Implantable device check   Meds ordered this encounter  Medications  . metoprolol succinate (TOPROL-XL) 50 MG 24 hr tablet    Sig: Take $RemoveB'100mg'sdtStEvp$  in the AM and $Remo'50mg'pesSz$  in the PM     Dispense:  90 tablet    Refill:  [place] Francies Inch, MD, [facility] [phone] office [phone] pager

## 2014-03-29 LAB — BASIC METABOLIC PANEL
BUN: 28 mg/dL — ABNORMAL HIGH (ref 6–23)
CALCIUM: 8.5 mg/dL (ref 8.4–10.5)
CHLORIDE: 106 meq/L (ref 96–112)
CO2: 28 meq/L (ref 19–32)
Creat: 1.21 mg/dL (ref 0.50–1.35)
GLUCOSE: 157 mg/dL — AB (ref 70–99)
Potassium: 4.7 mEq/L (ref 3.5–5.3)
SODIUM: 141 meq/L (ref 135–145)

## 2014-03-29 LAB — MAGNESIUM: Magnesium: 2.1 mg/dL (ref 1.5–2.5)

## 2014-04-10 ENCOUNTER — Encounter: Payer: Self-pay | Admitting: *Deleted

## 2014-05-13 DIAGNOSIS — Z950 Presence of cardiac pacemaker: Secondary | ICD-10-CM | POA: Diagnosis not present

## 2014-05-13 DIAGNOSIS — E782 Mixed hyperlipidemia: Secondary | ICD-10-CM | POA: Diagnosis not present

## 2014-05-13 DIAGNOSIS — Z951 Presence of aortocoronary bypass graft: Secondary | ICD-10-CM | POA: Diagnosis not present

## 2014-05-13 DIAGNOSIS — C61 Malignant neoplasm of prostate: Secondary | ICD-10-CM | POA: Diagnosis not present

## 2014-05-13 DIAGNOSIS — I2510 Atherosclerotic heart disease of native coronary artery without angina pectoris: Secondary | ICD-10-CM | POA: Diagnosis not present

## 2014-05-13 DIAGNOSIS — N400 Enlarged prostate without lower urinary tract symptoms: Secondary | ICD-10-CM | POA: Diagnosis not present

## 2014-05-13 DIAGNOSIS — E119 Type 2 diabetes mellitus without complications: Secondary | ICD-10-CM | POA: Diagnosis not present

## 2014-05-13 DIAGNOSIS — E559 Vitamin D deficiency, unspecified: Secondary | ICD-10-CM | POA: Diagnosis not present

## 2014-05-17 DIAGNOSIS — E1169 Type 2 diabetes mellitus with other specified complication: Secondary | ICD-10-CM | POA: Diagnosis not present

## 2014-05-17 DIAGNOSIS — E1122 Type 2 diabetes mellitus with diabetic chronic kidney disease: Secondary | ICD-10-CM | POA: Diagnosis not present

## 2014-05-17 DIAGNOSIS — C61 Malignant neoplasm of prostate: Secondary | ICD-10-CM | POA: Diagnosis not present

## 2014-05-17 DIAGNOSIS — N183 Chronic kidney disease, stage 3 (moderate): Secondary | ICD-10-CM | POA: Diagnosis not present

## 2014-05-17 DIAGNOSIS — I2510 Atherosclerotic heart disease of native coronary artery without angina pectoris: Secondary | ICD-10-CM | POA: Diagnosis not present

## 2014-05-17 DIAGNOSIS — E782 Mixed hyperlipidemia: Secondary | ICD-10-CM | POA: Diagnosis not present

## 2014-05-17 DIAGNOSIS — E559 Vitamin D deficiency, unspecified: Secondary | ICD-10-CM | POA: Diagnosis not present

## 2014-05-17 DIAGNOSIS — E1165 Type 2 diabetes mellitus with hyperglycemia: Secondary | ICD-10-CM | POA: Diagnosis not present

## 2014-05-20 ENCOUNTER — Telehealth: Payer: Self-pay | Admitting: Cardiovascular Disease

## 2014-05-20 NOTE — Telephone Encounter (Signed)
Received records from [facility] (Dr Legrand Como Altheimer) for appointment on 07/02/14 with Dr Sallyanne Kuster.  Records given to [facility] (medical records) for Dr Croitoru's schedule on 07/02/14.  lp

## 2014-05-22 ENCOUNTER — Other Ambulatory Visit: Payer: Self-pay | Admitting: *Deleted

## 2014-05-22 MED ORDER — VALSARTAN 160 MG PO TABS: 160.0000 mg | ORAL_TABLET | Freq: Every day | ORAL | Status: DC

## 2014-05-22 MED ORDER — METOPROLOL SUCCINATE ER 50 MG PO TB24: ORAL_TABLET | ORAL | Status: DC

## 2014-05-30 ENCOUNTER — Telehealth: Payer: Self-pay | Admitting: *Deleted

## 2014-05-30 NOTE — Telephone Encounter (Signed)
Notified that Dr. Loletha Grayer will review and advise on alternative to Metoprolol ER 50mg  (100mg  am and 50mg  pm)since [employer] will no longer cover the drug.  Wife voiced understanding.  Will call with info once Dr. Loletha Grayer has reviewed.

## 2014-06-03 ENCOUNTER — Telehealth: Payer: Self-pay | Admitting: *Deleted

## 2014-06-03 MED ORDER — METOPROLOL TARTRATE 50 MG PO TABS: ORAL_TABLET | ORAL | Status: DC

## 2014-06-03 NOTE — Telephone Encounter (Signed)
Notified metoprolol ER is being changed to metoprolol tart due to ER is not on the formulary.  Wife voiced understanding.  New Rx sent to pharmacy.

## 2014-06-21 ENCOUNTER — Other Ambulatory Visit: Payer: Self-pay | Admitting: *Deleted

## 2014-06-21 MED ORDER — VALSARTAN 160 MG PO TABS: 160.0000 mg | ORAL_TABLET | Freq: Every day | ORAL | Status: DC

## 2014-06-21 MED ORDER — METOPROLOL TARTRATE 50 MG PO TABS: ORAL_TABLET | ORAL | Status: DC

## 2014-06-21 MED ORDER — FUROSEMIDE 20 MG PO TABS: 20.0000 mg | ORAL_TABLET | Freq: Every day | ORAL | Status: DC

## 2014-06-21 NOTE — Telephone Encounter (Signed)
Rx(s) sent to pharmacy electronically.  

## 2014-07-02 ENCOUNTER — Encounter: Payer: Self-pay | Admitting: Cardiovascular Disease

## 2014-07-02 ENCOUNTER — Ambulatory Visit (INDEPENDENT_AMBULATORY_CARE_PROVIDER_SITE_OTHER): Payer: Commercial Managed Care - HMO | Admitting: Cardiovascular Disease

## 2014-07-02 ENCOUNTER — Ambulatory Visit
Admission: RE | Admit: 2014-07-02 | Discharge: 2014-07-02 | Disposition: A | Discharge status: Home / Self Care | Payer: Commercial Managed Care - HMO | Source: Ambulatory Visit | Attending: Cardiovascular Disease | Admitting: Cardiovascular Disease

## 2014-07-02 VITALS — BP 134/64 | HR 65 | Resp 20 | Ht 68.0 in | Wt 194.5 lb

## 2014-07-02 DIAGNOSIS — I472 Ventricular tachycardia: Secondary | ICD-10-CM | POA: Diagnosis not present

## 2014-07-02 DIAGNOSIS — I10 Essential (primary) hypertension: Secondary | ICD-10-CM

## 2014-07-02 DIAGNOSIS — I2510 Atherosclerotic heart disease of native coronary artery without angina pectoris: Secondary | ICD-10-CM

## 2014-07-02 DIAGNOSIS — I4729 Other ventricular tachycardia: Secondary | ICD-10-CM

## 2014-07-02 DIAGNOSIS — Z950 Presence of cardiac pacemaker: Secondary | ICD-10-CM | POA: Diagnosis not present

## 2014-07-02 DIAGNOSIS — I4892 Unspecified atrial flutter: Secondary | ICD-10-CM

## 2014-07-02 DIAGNOSIS — I455 Other specified heart block: Secondary | ICD-10-CM

## 2014-07-02 DIAGNOSIS — J948 Other specified pleural conditions: Secondary | ICD-10-CM | POA: Diagnosis not present

## 2014-07-02 DIAGNOSIS — E785 Hyperlipidemia, unspecified: Secondary | ICD-10-CM | POA: Diagnosis not present

## 2014-07-02 DIAGNOSIS — I442 Atrioventricular block, complete: Secondary | ICD-10-CM

## 2014-07-02 DIAGNOSIS — R0602 Shortness of breath: Secondary | ICD-10-CM

## 2014-07-02 LAB — MDC_IDC_ENUM_SESS_TYPE_INCLINIC
Brady Statistic AP VS Percent: 0.3 %
Brady Statistic AS VS Percent: 4 %
Lead Channel Impedance Value: 478 Ohm
Lead Channel Pacing Threshold Amplitude: 0.625 V
Lead Channel Pacing Threshold Amplitude: 1 V
Lead Channel Pacing Threshold Pulse Width: 0.4 ms
Lead Channel Pacing Threshold Pulse Width: 0.4 ms
Lead Channel Setting Pacing Pulse Width: 0.4 ms
Lead Channel Setting Sensing Sensitivity: 2.8 mV
MDC IDC MSMT BATTERY IMPEDANCE: 974 Ohm
MDC IDC MSMT BATTERY VOLTAGE: 2.76 V
MDC IDC MSMT LEADCHNL RA IMPEDANCE VALUE: 476 Ohm
MDC IDC MSMT LEADCHNL RA SENSING INTR AMPL: 1 mV
MDC IDC SET LEADCHNL RA PACING AMPLITUDE: 2.25 V
MDC IDC SET LEADCHNL RV PACING AMPLITUDE: 2.5 V
MDC IDC STAT BRADY AP VP PERCENT: 66.7 %
MDC IDC STAT BRADY AS VP PERCENT: 29 %

## 2014-07-02 MED ORDER — APIXABAN 5 MG PO TABS: 5.0000 mg | ORAL_TABLET | Freq: Two times a day (BID) | ORAL | Status: DC

## 2014-07-02 NOTE — Patient Instructions (Signed)
START Eliquis 5mg  twice a day.  STOP the Aspirin.  A chest x-ray takes a picture of the organs and structures inside the chest, including the heart, lungs, and blood vessels today at [facility] [place]. 1st floor. This test can show several things, including, whether the heart is enlarges; whether fluid is building up in the lungs; and whether pacemaker / defibrillator leads are still in place.  Dr. Sallyanne Kuster recommends that you schedule a follow-up appointment in: One month pacer clinic - override.

## 2014-07-03 ENCOUNTER — Telehealth: Payer: Self-pay | Admitting: *Deleted

## 2014-07-03 NOTE — Telephone Encounter (Signed)
Spoke to patient and wife. Result given . Verbalized understanding Routed Xray to Dr  [name]. Patient's primary.

## 2014-07-03 NOTE — Telephone Encounter (Signed)
-----   Message from [name], MD sent at 07/03/2014  8:48 AM EST ----- Chest xray does not show either pneumonia or overt heart failure. May have bronchitis based on symptoms. Discuss treatment with PCP Proceed with anticoagulation as discussed

## 2014-07-04 ENCOUNTER — Encounter: Payer: Self-pay | Admitting: Cardiovascular Disease

## 2014-07-04 DIAGNOSIS — I4892 Unspecified atrial flutter: Secondary | ICD-10-CM | POA: Insufficient documentation

## 2014-07-04 HISTORY — DX: Unspecified atrial flutter: I48.92

## 2014-07-04 NOTE — Progress Notes (Signed)
Patient ID: [name] Powell, male   DOB: Sep 17, 1922, 81 y.o.   MRN: [id number]     Reason for office visit New onset atrial flutter, complete heart block, history of sinus node arrest, CAD status post CABG, chronic diastolic heart failure  [name] Powell has been having a lot of problems with upper airway congestion for the last several weeks. He is seeing his primary care physician today about this. He has noticed very mild ankle swelling. Denies chest pain, orthopnea or paroxysmal nocturnal dyspnea. He has not had any palpitations.  Interrogation of his pacemaker today shows that he has been in persistent atrial flutter since January 10. The rate is around 200 bpm. It is fairly challenging to diagnosis on the surface electrocardiogram with flutter waves that are slow and rolling. It does not look like typical counterclockwise right atrial flutter. He has underlying complete heart block in the atrial arrhythmia has had no impact on his ventricular rate. An episode of similar atrial flutter with a cycle length of about 350 ms and lasting for about 30 minutes was recorded in November 2015.  Prior to the onset of the atrial flutter he did not have detectable P waves and was pacing the atrium 90% of time. Pacemaker function is otherwise normal in lead parameters are excellent. In the past the device has recorded nonsustained ventricular tachycardia consisting of up to 20 beats. None has been seen on the most recent interval.  12 years have passed since his CABG surgery. His last nuclear study in 2011 was normal. Echo in April 2014 was normal. 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 His dual-chamber device (Medtronic) was implanted in August 2008. He has had very brief and asymptomatic atrial flutter (< 7 minutes) recorded by his device January 2015. 20 beats of nonsustained VT recorded in September 2015. 30 minute episode of  paroxysmal atrial tach in November 2015. He is very sensitive to programming changes. He feels better at lower rate: programmed at 60 rather than 70. He has treated coronary risk factors (hypertension, hyperlipidemia, diabetes mellitus type 2 on oral antidiabetics).    No Known Allergies  Current Outpatient Prescriptions  Medication Sig Dispense Refill  . apixaban (ELIQUIS) 5 MG TABS tablet Take 1 tablet (5 mg total) by mouth 2 (two) times daily. 60 tablet 6  . atorvastatin (LIPITOR) 20 MG tablet Take 20 mg by mouth daily.    . Cholecalciferol (VITAMIN D PO) Take 10,000 Units by mouth every 7 (seven) days. On Friday    . furosemide (LASIX) 20 MG tablet Take 1 tablet (20 mg total) by mouth daily. 90 tablet 2  . glimepiride (AMARYL) 4 MG tablet Take 4 mg by mouth daily before breakfast.    . metoprolol (LOPRESSOR) 50 MG tablet Take 2 tablets (100mg ) by mouth in the morning and 1 tablet (50mg ) in the evening. 270 tablet 2  . Multiple Vitamins-Minerals (PRESERVISION AREDS) CAPS Take 1 capsule by mouth 2 (two) times daily.    . polyethylene glycol (MIRALAX / GLYCOLAX) packet Take 17 g by mouth daily. To prevent constipation    . sitaGLIPtin (JANUVIA) 100 MG tablet Take 100 mg by mouth daily.    Marland Kitchen triamcinolone cream (KENALOG) 0.1 % as needed.  3  . valsartan (DIOVAN) 160 MG tablet Take 1 tablet (160 mg total) by mouth daily. 90 tablet 2   No current facility-administered medications for this visit.  Past Medical History  Diagnosis Date  . Diabetes mellitus   . Hypertension   . Pacemaker 12/12/2006    Medtronic adapta  . Coronary artery disease   . 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 CHB (complete heart block)   . Dyslipidemia   . H/O prostate cancer   . Ventricular tachycardia (paroxysmal) 03/28/2014  . New onset atrial flutter, persistent 07/04/2014    Past Surgical History  Procedure Laterality Date  . Pacemaker insertion   12/12/2006    Medtronic adapta  . Coronary artery bypass graft  09/04/2001    LIMA to LAD,SVG to diagonal,SVG to ramus intermedius,SVG to PDA  . Prostate surgery  2001    cancer  . Nm myoview ltd  01/09/2010    no ischemia    No family history on file.  History   Social History  . Marital Status: Married    Spouse Name: N/A  . Number of Children: N/A  . Years of Education: N/A   Occupational History  . Not on file.   Social History Main Topics  . Smoking status: Former Smoker    Quit date: 05/14/1962  . Smokeless tobacco: Not on file  . Alcohol Use: Yes     Comment: seldom  . Drug Use: No  . Sexual Activity: Not on file   Other Topics Concern  . Not on file   Social History Narrative    Review of systems: Upper airway congestion, cough (without hemoptysis, nonproductive), but without fever, chills or pleuritic chest discomfort or sore throat. Some deep coughing from his bronchial tubes. The patient specifically denies any chest pain at rest or with exertion, dyspnea at rest or with exertion, orthopnea, paroxysmal nocturnal dyspnea, syncope, palpitations, focal neurological deficits, intermittent claudication, lower extremity edema, unexplained weight gain, cough, hemoptysis or wheezing.  The patient also denies abdominal pain, nausea, vomiting, dysphagia, diarrhea, constipation, polyuria, polydipsia, dysuria, hematuria, frequency, urgency, abnormal bleeding or bruising, fever, chills, unexpected weight changes, mood swings, change in skin or hair texture, change in voice quality, auditory or visual problems, allergic reactions or rashes, new musculoskeletal complaints other than usual "aches and pains".   PHYSICAL EXAM BP 134/64 mmHg  Pulse 65  Resp 20  Ht 5\' 8"  (1.727 m)  Wt 194 lb 8 oz (88.225 kg)  BMI 29.58 kg/m2  General: Alert, oriented x3, no distress  Head: no evidence of trauma, PERRL, EOMI, no exophtalmos or lid lag, no myxedema, no xanthelasma; normal ears,  nose and oropharynx  Neck: normal jugular venous pulsations and no hepatojugular reflux; brisk carotid pulses without delay and no carotid bruits  Chest: clear to auscultation, no signs of consolidation by percussion or palpation, normal fremitus, symmetrical and full respiratory excursions; tenotomy scar, healthy left subclavian pacemaker site  Cardiovascular: normal position and quality of the apical impulse, regular rhythm, normal first and second heart sounds, no murmurs, rubs or gallops  Abdomen: no tenderness or distention, no masses by palpation, no abnormal pulsatility or arterial bruits, normal bowel sounds, no hepatosplenomegaly  Extremities: no clubbing, cyanosis or edema; 2+ radial, ulnar and brachial pulses bilaterally; 2+ right femoral, posterior tibial and dorsalis pedis pulses; 2+ left femoral, posterior tibial and dorsalis pedis pulses; no subclavian or femoral bruits  Neurological: grossly nonfocal   EKG: Atrial flutter, ventricular pacing, occasional PVCs  Lipid Panel  No results found for: CHOL, TRIG, HDL, CHOLHDL, VLDL, LDLCALC, LDLDIRECT  BMET  Component Value Date/Time   NA 141 03/28/2014 1106   K 4.7 03/28/2014 1106   CL 106 03/28/2014 1106   CO2 28 03/28/2014 1106   GLUCOSE 157* 03/28/2014 1106   BUN 28* 03/28/2014 1106   CREATININE 1.21 03/28/2014 1106   CREATININE 1.40* 08/05/2011 0132   CALCIUM 8.5 03/28/2014 1106   GFRNONAA >60 12/13/2006 0525   GFRAA  12/13/2006 0525    >60        The eGFR has been calculated using the MDRD equation. This calculation has not been validated in all clinical     ASSESSMENT AND PLAN  Persistent atrial flutter Had a 30 minute paroxysmal episode in the past, but the current arrhythmia has been going on for 6 weeks. It does not seem to have a clear connection to his current upper respiratory symptoms. I don't think he is congestive heart failure by physical exam. The arrhythmia looks fairly organized and slow and  might be amenable to overdrive pacing using this pacemaker. Unfortunately he is not yet anticoagulated. We discussed the risk of stroke with atrial flutter. After I presented the pros and cons of warfarin anticoagulation versus a newer anticoagulant, I recommended that he start Eliquis 5 mg twice a day and after 4 weeks we'll bring him back for overdrive pacing. If this is unsuccessful would recommend consideration of cardioversion since this is his first sustained event.  Nonsustained VT None recorded recently. We increased his beta blocker at his last appointment  CAD s/p CABG Recent evaluation has not shown evidence of active ischemia. Small apical infarct, no acute ischemia. On statin.   CHF with mildly reduced EF 44% I don't think he is in congestive heart failure by physical exam but will check a chest x-ray. I think he has a upper respiratory viral infection. He is on a very low dose of loop diuretic already. Continue beta blockers and angiotensin receptor blocker.   Orders Placed This Encounter  Procedures  . DG Chest 2 View  . Implantable device check  . EKG 12-Lead   Meds ordered this encounter  Medications  . triamcinolone cream (KENALOG) 0.1 %    Sig: as needed.    Refill:  3  . apixaban (ELIQUIS) 5 MG TABS tablet    Sig: Take 1 tablet (5 mg total) by mouth 2 (two) times daily.    Dispense:  60 tablet    Refill:  [place] Mandy Peeks, MD, [facility] [phone] office [phone] pager

## 2014-07-09 DIAGNOSIS — J069 Acute upper respiratory infection, unspecified: Secondary | ICD-10-CM | POA: Diagnosis not present

## 2014-07-10 ENCOUNTER — Encounter: Payer: Self-pay | Admitting: Cardiovascular Disease

## 2014-07-12 DIAGNOSIS — H3531 Nonexudative age-related macular degeneration: Secondary | ICD-10-CM | POA: Diagnosis not present

## 2014-07-16 DIAGNOSIS — E782 Mixed hyperlipidemia: Secondary | ICD-10-CM | POA: Diagnosis not present

## 2014-07-16 DIAGNOSIS — E559 Vitamin D deficiency, unspecified: Secondary | ICD-10-CM | POA: Diagnosis not present

## 2014-07-16 DIAGNOSIS — E1165 Type 2 diabetes mellitus with hyperglycemia: Secondary | ICD-10-CM | POA: Diagnosis not present

## 2014-07-19 DIAGNOSIS — I2510 Atherosclerotic heart disease of native coronary artery without angina pectoris: Secondary | ICD-10-CM | POA: Diagnosis not present

## 2014-07-19 DIAGNOSIS — E1122 Type 2 diabetes mellitus with diabetic chronic kidney disease: Secondary | ICD-10-CM | POA: Diagnosis not present

## 2014-07-19 DIAGNOSIS — N183 Chronic kidney disease, stage 3 (moderate): Secondary | ICD-10-CM | POA: Diagnosis not present

## 2014-07-19 DIAGNOSIS — E782 Mixed hyperlipidemia: Secondary | ICD-10-CM | POA: Diagnosis not present

## 2014-07-19 DIAGNOSIS — E559 Vitamin D deficiency, unspecified: Secondary | ICD-10-CM | POA: Diagnosis not present

## 2014-07-19 DIAGNOSIS — E1169 Type 2 diabetes mellitus with other specified complication: Secondary | ICD-10-CM | POA: Diagnosis not present

## 2014-07-19 DIAGNOSIS — C61 Malignant neoplasm of prostate: Secondary | ICD-10-CM | POA: Diagnosis not present

## 2014-07-19 DIAGNOSIS — E1165 Type 2 diabetes mellitus with hyperglycemia: Secondary | ICD-10-CM | POA: Diagnosis not present

## 2014-07-30 ENCOUNTER — Encounter: Payer: Self-pay | Admitting: Cardiovascular Disease

## 2014-07-30 ENCOUNTER — Ambulatory Visit (INDEPENDENT_AMBULATORY_CARE_PROVIDER_SITE_OTHER): Payer: Commercial Managed Care - HMO | Admitting: Cardiovascular Disease

## 2014-07-30 VITALS — BP 143/73 | HR 65 | Resp 16 | Ht 68.0 in | Wt 195.8 lb

## 2014-07-30 DIAGNOSIS — I10 Essential (primary) hypertension: Secondary | ICD-10-CM | POA: Diagnosis not present

## 2014-07-30 DIAGNOSIS — I4892 Unspecified atrial flutter: Secondary | ICD-10-CM

## 2014-07-30 DIAGNOSIS — I2510 Atherosclerotic heart disease of native coronary artery without angina pectoris: Secondary | ICD-10-CM | POA: Diagnosis not present

## 2014-07-30 DIAGNOSIS — Z950 Presence of cardiac pacemaker: Secondary | ICD-10-CM | POA: Diagnosis not present

## 2014-07-30 DIAGNOSIS — I442 Atrioventricular block, complete: Secondary | ICD-10-CM | POA: Diagnosis not present

## 2014-07-30 DIAGNOSIS — I455 Other specified heart block: Secondary | ICD-10-CM | POA: Diagnosis not present

## 2014-07-30 MED ORDER — POTASSIUM CHLORIDE ER 10 MEQ PO TBCR: 10.0000 meq | EXTENDED_RELEASE_TABLET | Freq: Every day | ORAL | Status: DC

## 2014-07-30 MED ORDER — FUROSEMIDE 40 MG PO TABS: 40.0000 mg | ORAL_TABLET | Freq: Every day | ORAL | Status: DC

## 2014-07-30 NOTE — Progress Notes (Signed)
Patient ID: [name] Powell, male   DOB: [birth year], 81 y.o.   MRN: [id number]      Cardiology Office Note   Date:  07/30/2014   ID:  [name] Powell, [name] Powell 1922/12/30, MRN [id number]  PCP:  [name], MD  Cardiologist:   [name], MD   Chief Complaint  Patient presents with  . Follow-up    1 mo. dyspnea with walking and lying       History of Present Illness: [name] Powell is a 81 y.o. male who presents for persistent atrial flutter in the setting of ischemic heart disease, sinus node arrest, complete heart block and a dual chamber permanent pacemaker.  He has noticed increased ankle edema and exertional dyspnea, possibly mild orthopnea. Switching from HCTZ to a low dose of furosemide hasn't really helped.  Atrial flutter has been continuing essentially without interruption since early January. Anticoagulation was started at his last visit and he returns for elective attempt at overdrive pacing today.  The flutter cycle length is roughly 300 ms. Multiple attempts at burst overdrive pacing via his atrial lead (CL gradually reduced from 280 to 190 ms) did not succeed in clearly entraining the arrhythmia or terminating it.  Prior to the onset of the atrial flutter he did not have detectable P waves and was pacing the atrium 90% of time. Pacemaker function is otherwise normal in lead parameters are excellent. In the past the device has recorded nonsustained ventricular tachycardia consisting of up to 20 beats. None has been seen on the most recent interval.  12 years have passed since his CABG surgery. His last nuclear study in 2011 was normal. Echo in April 2014 was normal. 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 His dual-chamber device (Medtronic) was implanted in August 2008. He has had very brief and asymptomatic atrial flutter (< 7 minutes) recorded by his device January 2015. 20 beats  of nonsustained VT recorded in September 2015. 30 minute episode of paroxysmal atrial tach in November 2015. He is very sensitive to programming changes. He feels better at lower rate: programmed at 60 rather than 70. He has treated coronary risk factors (hypertension, hyperlipidemia, diabetes mellitus type 2 on oral antidiabetics).    Past Medical History  Diagnosis Date  . Diabetes mellitus   . Hypertension   . Pacemaker 12/12/2006    Medtronic adapta  . Coronary artery disease   . 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 CHB (complete heart block)   . Dyslipidemia   . H/O prostate cancer   . Ventricular tachycardia (paroxysmal) 03/28/2014  . New onset atrial flutter, persistent 07/04/2014    Past Surgical History  Procedure Laterality Date  . Pacemaker insertion  12/12/2006    Medtronic adapta  . Coronary artery bypass graft  09/04/2001    LIMA to LAD,SVG to diagonal,SVG to ramus intermedius,SVG to PDA  . Prostate surgery  2001    cancer  . Nm myoview ltd  01/09/2010    no ischemia     Current Outpatient Prescriptions  Medication Sig Dispense Refill  . apixaban (ELIQUIS) 5 MG TABS tablet Take 1 tablet (5 mg total) by mouth 2 (two) times daily. 60 tablet 6  . atorvastatin (LIPITOR) 20 MG tablet Take 20 mg by mouth daily.    . Cholecalciferol (VITAMIN D PO) Take 10,000 Units by mouth  every 7 (seven) days. On Friday    . glimepiride (AMARYL) 4 MG tablet Take 4 mg by mouth daily before breakfast.    . metoprolol (LOPRESSOR) 50 MG tablet Take 2 tablets (100mg ) by mouth in the morning and 1 tablet (50mg ) in the evening. 270 tablet 2  . Multiple Vitamins-Minerals (PRESERVISION AREDS) CAPS Take 1 capsule by mouth 2 (two) times daily.    . polyethylene glycol (MIRALAX / GLYCOLAX) packet Take 17 g by mouth daily. To prevent constipation    . sitaGLIPtin (JANUVIA) 100 MG tablet Take 50 mg by mouth daily.     Marland Kitchen triamcinolone cream  (KENALOG) 0.1 % as needed.  3  . valsartan (DIOVAN) 160 MG tablet Take 1 tablet (160 mg total) by mouth daily. 90 tablet 2  . furosemide (LASIX) 40 MG tablet Take 1 tablet (40 mg total) by mouth daily. 90 tablet 3  . potassium chloride (K-DUR) 10 MEQ tablet Take 1 tablet (10 mEq total) by mouth daily. 90 tablet 3   No current facility-administered medications for this visit.    Allergies:   Review of patient's allergies indicates no known allergies.    Social History:  The patient  reports that he quit smoking about 52 years ago. He does not have any smokeless tobacco history on file. He reports that he drinks alcohol. He reports that he does not use illicit drugs.   Family History:  The patient's family history is not contributory  ROS:  Please see the history of present illness.    Otherwise, review of systems positive for nonproductive cough.   All other systems are reviewed and negative.    PHYSICAL EXAM: VS:  BP 143/73 mmHg  Pulse 65  Resp 16  Ht 5\' 8"  (1.727 m)  Wt 195 lb 12.8 oz (88.814 kg)  BMI 29.78 kg/m2 , BMI Body mass index is 29.78 kg/(m^2). General: Alert, oriented x3, no distress  Head: no evidence of trauma, PERRL, EOMI, no exophtalmos or lid lag, no myxedema, no xanthelasma; normal ears, nose and oropharynx  Neck: normal jugular venous pulsations and no hepatojugular reflux; brisk carotid pulses without delay and no carotid bruits  Chest: clear to auscultation, no signs of consolidation by percussion or palpation, normal fremitus, symmetrical and full respiratory excursions; sternotomy scar, healthy left subclavian pacemaker site  Cardiovascular: normal position and quality of the apical impulse, regular rhythm, normal first and second heart sounds, no murmurs, rubs or gallops  Abdomen: no tenderness or distention, no masses by palpation, no abnormal pulsatility or arterial bruits, normal bowel sounds, no hepatosplenomegaly  Extremities: no clubbing, cyanosis  or edema; 2+ radial, ulnar and brachial pulses bilaterally; 2+ right femoral, posterior tibial and dorsalis pedis pulses; 2+ left femoral, posterior tibial and dorsalis pedis pulses; no subclavian or femoral bruits  Neurological: grossly nonfocal     Recent Labs: 03/28/2014: BUN 28*; Creatinine 1.21; Magnesium 2.1; Potassium 4.7; Sodium 141    Lipid Panel No results found for: CHOL, TRIG, HDL, CHOLHDL, VLDL, LDLCALC, LDLDIRECT    Wt Readings from Last 3 Encounters:  07/30/14 195 lb 12.8 oz (88.814 kg)  07/02/14 194 lb 8 oz (88.225 kg)  03/28/14 199 lb 11.2 oz (90.583 kg)    ASSESSMENT AND PLAN:  Persistent atrial flutter The current arrhythmia has been going on for 9-10 weeks. Although the arrhythmia looks fairly organized and slow it was not amenable to overdrive pacing using his pacemaker. He will stay on Eliquis 5 mg twice a day. I recommended  consideration of cardioversion since this is his first sustained event. However, suspect the flutter/atrial fibrillation will return. He is not sure he wants to have a cardioversion, he wants to think about it.  Nonsustained VT None recorded recently. Continue beta blocker.  CAD s/p CABG Recent evaluation has not shown evidence of active ischemia. Small apical infarct, no acute ischemia. On statin.   CHF with mildly reduced EF 44% Symptoms suggest congestive heart failure and he has developed edema. Increase dose of loop diuretic. Continue beta blockers and angiotensin receptor blocker.   Current medicines are reviewed at length with the patient today.  The patient does not have concerns regarding medicines.  The following changes have been made:  Increase furosemide to 40 mg daily and add KCl 10 mEq daily.  Labs/ tests ordered today include:  Orders Placed This Encounter  Procedures  . Basic Metabolic Panel (BMET)    Patient Instructions  Your physician has recommended you make the following change in your medication: INCREASE  FUROSEMIDE TO 40 MG DAILY AND START POTASSIUM 10 MEQ DAILY  Your physician recommends that you return for lab work in: [place] (BMP)  Your physician recommends that you schedule a follow-up appointment in: [place], Chaquita Basques, MD  07/30/2014 9:11 PM    [name], MD, [facility] [phone] office [phone] pager

## 2014-07-30 NOTE — Patient Instructions (Signed)
Your physician has recommended you make the following change in your medication: INCREASE FUROSEMIDE TO 40 MG DAILY AND START POTASSIUM 10 [place]  Your physician recommends that you return for lab work in: [place] (BMP)  Your physician recommends that you schedule a follow-up appointment in: [place]

## 2014-07-31 ENCOUNTER — Telehealth: Payer: Self-pay | Admitting: Cardiovascular Disease

## 2014-07-31 LAB — MDC_IDC_ENUM_SESS_TYPE_INCLINIC
Battery Impedance: 1027 Ohm
Battery Remaining Longevity: 39 mo
Brady Statistic AP VS Percent: 0 %
Brady Statistic AS VP Percent: 37 %
Brady Statistic AS VS Percent: 6 %
Date Time Interrogation Session: 20160322170602
Lead Channel Impedance Value: 492 Ohm
Lead Channel Pacing Threshold Amplitude: 0.75 V
Lead Channel Sensing Intrinsic Amplitude: 1 mV
Lead Channel Setting Pacing Amplitude: 2 V
Lead Channel Setting Pacing Pulse Width: 0.4 ms
MDC IDC MSMT BATTERY VOLTAGE: 2.76 V
MDC IDC MSMT LEADCHNL RA IMPEDANCE VALUE: 470 Ohm
MDC IDC MSMT LEADCHNL RV PACING THRESHOLD PULSEWIDTH: 0.4 ms
MDC IDC SET LEADCHNL RV PACING AMPLITUDE: 2.5 V
MDC IDC SET LEADCHNL RV SENSING SENSITIVITY: 5.6 mV
MDC IDC STAT BRADY AP VP PERCENT: 57 %

## 2014-07-31 NOTE — Telephone Encounter (Signed)
Please call,concerning his Potassium and his Furosemide.

## 2014-07-31 NOTE — Telephone Encounter (Signed)
Returned call to patient's wife. She wanted to know if it was advisable to go ahead and increase furosmide since Rx for potassium does not come in from mail order for a few more days. Informed wife should be OK to go ahead and increase furosemide (potassium was 4.7 in Nov) but encouraged him to consume foods more rich in potassium until supplement comes in. She voiced understanding.

## 2014-08-07 DIAGNOSIS — D0439 Carcinoma in situ of skin of other parts of face: Secondary | ICD-10-CM | POA: Diagnosis not present

## 2014-08-07 DIAGNOSIS — C44219 Basal cell carcinoma of skin of left ear and external auricular canal: Secondary | ICD-10-CM | POA: Diagnosis not present

## 2014-08-07 DIAGNOSIS — D0430 Carcinoma in situ of skin of unspecified part of face: Secondary | ICD-10-CM | POA: Diagnosis not present

## 2014-08-07 DIAGNOSIS — C44211 Basal cell carcinoma of skin of unspecified ear and external auricular canal: Secondary | ICD-10-CM | POA: Diagnosis not present

## 2014-08-13 DIAGNOSIS — T798XXA Other early complications of trauma, initial encounter: Secondary | ICD-10-CM | POA: Diagnosis not present

## 2014-08-13 DIAGNOSIS — B958 Unspecified staphylococcus as the cause of diseases classified elsewhere: Secondary | ICD-10-CM | POA: Diagnosis not present

## 2014-08-13 DIAGNOSIS — I4892 Unspecified atrial flutter: Secondary | ICD-10-CM | POA: Diagnosis not present

## 2014-08-14 LAB — BASIC METABOLIC PANEL
BUN: 29 mg/dL — ABNORMAL HIGH (ref 6–23)
CO2: 29 meq/L (ref 19–32)
Calcium: 8.7 mg/dL (ref 8.4–10.5)
Chloride: 104 mEq/L (ref 96–112)
Creat: 1.25 mg/dL (ref 0.50–1.35)
Glucose, Bld: 141 mg/dL — ABNORMAL HIGH (ref 70–99)
POTASSIUM: 4.8 meq/L (ref 3.5–5.3)
Sodium: 140 mEq/L (ref 135–145)

## 2014-08-23 ENCOUNTER — Encounter: Payer: Self-pay | Admitting: Cardiovascular Disease

## 2014-09-30 ENCOUNTER — Telehealth: Payer: Self-pay | Admitting: Cardiovascular Disease

## 2014-09-30 NOTE — Telephone Encounter (Signed)
[facility] will be faxing you a form for eliquis can be approved

## 2014-09-30 NOTE — Telephone Encounter (Signed)
[employer] will be Faxing you a form for his Eliquis,so it can be approved.

## 2014-10-02 NOTE — Telephone Encounter (Signed)
PA for Eliquis faxed to [place].

## 2014-10-14 ENCOUNTER — Telehealth: Payer: Self-pay

## 2014-10-14 NOTE — Telephone Encounter (Signed)
Called to notify patient of Eliquis denial. Patient's wife stated that they had plenty of medicine and that they had no issues getting most recent prescription refilled. Advised patient to call back if they had any issues when refilling Eliquis in the future.

## 2014-10-21 DIAGNOSIS — E782 Mixed hyperlipidemia: Secondary | ICD-10-CM | POA: Diagnosis not present

## 2014-10-21 DIAGNOSIS — E1165 Type 2 diabetes mellitus with hyperglycemia: Secondary | ICD-10-CM | POA: Diagnosis not present

## 2014-10-21 DIAGNOSIS — C61 Malignant neoplasm of prostate: Secondary | ICD-10-CM | POA: Diagnosis not present

## 2014-10-25 DIAGNOSIS — E1165 Type 2 diabetes mellitus with hyperglycemia: Secondary | ICD-10-CM | POA: Diagnosis not present

## 2014-10-25 DIAGNOSIS — N183 Chronic kidney disease, stage 3 (moderate): Secondary | ICD-10-CM | POA: Diagnosis not present

## 2014-10-25 DIAGNOSIS — E1169 Type 2 diabetes mellitus with other specified complication: Secondary | ICD-10-CM | POA: Diagnosis not present

## 2014-10-25 DIAGNOSIS — I2510 Atherosclerotic heart disease of native coronary artery without angina pectoris: Secondary | ICD-10-CM | POA: Diagnosis not present

## 2014-10-25 DIAGNOSIS — E782 Mixed hyperlipidemia: Secondary | ICD-10-CM | POA: Diagnosis not present

## 2014-10-25 DIAGNOSIS — E1122 Type 2 diabetes mellitus with diabetic chronic kidney disease: Secondary | ICD-10-CM | POA: Diagnosis not present

## 2014-10-25 DIAGNOSIS — E559 Vitamin D deficiency, unspecified: Secondary | ICD-10-CM | POA: Diagnosis not present

## 2014-10-25 DIAGNOSIS — C61 Malignant neoplasm of prostate: Secondary | ICD-10-CM | POA: Diagnosis not present

## 2014-12-04 DIAGNOSIS — D485 Neoplasm of uncertain behavior of skin: Secondary | ICD-10-CM | POA: Diagnosis not present

## 2014-12-04 DIAGNOSIS — L570 Actinic keratosis: Secondary | ICD-10-CM | POA: Diagnosis not present

## 2014-12-04 DIAGNOSIS — D0439 Carcinoma in situ of skin of other parts of face: Secondary | ICD-10-CM | POA: Diagnosis not present

## 2014-12-04 DIAGNOSIS — D0430 Carcinoma in situ of skin of unspecified part of face: Secondary | ICD-10-CM | POA: Diagnosis not present

## 2014-12-19 DIAGNOSIS — H26493 Other secondary cataract, bilateral: Secondary | ICD-10-CM | POA: Diagnosis not present

## 2014-12-19 DIAGNOSIS — H3531 Nonexudative age-related macular degeneration: Secondary | ICD-10-CM | POA: Diagnosis not present

## 2014-12-19 DIAGNOSIS — E119 Type 2 diabetes mellitus without complications: Secondary | ICD-10-CM | POA: Diagnosis not present

## 2015-01-04 ENCOUNTER — Other Ambulatory Visit: Payer: Self-pay | Admitting: Cardiovascular Disease

## 2015-01-09 DIAGNOSIS — D0430 Carcinoma in situ of skin of unspecified part of face: Secondary | ICD-10-CM | POA: Diagnosis not present

## 2015-01-09 DIAGNOSIS — I872 Venous insufficiency (chronic) (peripheral): Secondary | ICD-10-CM | POA: Diagnosis not present

## 2015-01-20 DIAGNOSIS — I4892 Unspecified atrial flutter: Secondary | ICD-10-CM | POA: Insufficient documentation

## 2015-01-20 NOTE — Progress Notes (Signed)
Patient ID: [name] Powell, male   DOB: Sep 21, 1922, 81 y.o.   MRN: [id number] -    Cardiology Office Note   Date:  01/21/2015   ID:  [name] Powell, DOB 1922-06-21, MRN ID:[id number]  PCP:  [name], MD  Cardiologist:   [name], MD   Chief Complaint  Patient presents with  . Follow-up    pt denied chest pain and SOB  . Leg Swelling    little leg swelling      History of Present Illness: [name] Powell is a 81 y.o. male who presents for CAD, sinus arrest and complete heart block (pacemaker dependent), persistent atrial flutter since January 2016.  He feels quite well. He goes for 30 minute walks every day. Edema and shortness of breath have not been problems since initiation of diuretic therapy in March.  Interrogation of his device shows an overall burden of atrial arrhythmia of 77%. There has been uninterrupted atrial arrhythmia at least since mid July. The exact burden of atrial flutter/fibrillation is difficult to ascertain since there does appear to be some degree of atrial under sensing. There is intermittent atrial pacing, but I think this is due to atrial under sensing. It is hard to say whether or not the periods of so-called normal rhythm recorded by his device actually represent cessation of his atrial arrhythmia. Regardless, the does not appear to be any change in his symptomatology. Average ventricular rate is around 64 bpm. There are a few bursts of high ventricular rate that probably represent brief nonsustained ventricular tachycardia. He has frequent PVCs.  Over 13 years have passed since his CABG surgery. His last nuclear study in August 2015 was low risk (small apical scar, no ischemia, EF 44%). Echo August 2015 confirmed apical akinesis, but EF was estimated at 55%.He has complete heart block and sinus node arrest and is pacemaker dependent (>90% A paced, 100% V paced).  His dual-chamber device (Medtronic) was implanted in August 2008. He had  20-beat nonsustained VT recorded in September 2015. 30 minute episode of paroxysmal atrial tach in November 2015.  In January 2016 he developed persistent atrial flutter and this appeared to trigger worsened CHF. Attempted overdrive pacing of the arrhythmia failed. He is very sensitive to programming changes. He feels better at lower rate limit programmed at 60 rather than 70 bpm. He has treated coronary risk factors (hypertension, hyperlipidemia, diabetes mellitus type 2 on oral antidiabetics).   Past Medical History  Diagnosis Date  . Diabetes mellitus   . Hypertension   . Pacemaker 12/12/2006    Medtronic adapta  . Coronary artery disease   . 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 CHB (complete heart block)   . Dyslipidemia   . H/O prostate cancer   . Ventricular tachycardia (paroxysmal) 03/28/2014  . New onset atrial flutter, persistent 07/04/2014    Past Surgical History  Procedure Laterality Date  . Pacemaker insertion  12/12/2006    Medtronic adapta  . Coronary artery bypass graft  09/04/2001    LIMA to LAD,SVG to diagonal,SVG to ramus intermedius,SVG to PDA  . Prostate surgery  2001    cancer  . Nm myoview ltd  01/09/2010    no ischemia     Current Outpatient Prescriptions  Medication Sig Dispense Refill  . atorvastatin (LIPITOR) 20 MG tablet Take 20 mg by mouth daily.    . Cholecalciferol (VITAMIN D PO) Take 10,000 Units  by mouth every 7 (seven) days. On Friday    . ELIQUIS 5 MG TABS tablet TAKE 1 TABLET (5 MG TOTAL) BY MOUTH 2 (TWO) TIMES DAILY. 180 tablet 1  . furosemide (LASIX) 40 MG tablet Take 1 tablet (40 mg total) by mouth daily. 90 tablet 3  . glimepiride (AMARYL) 4 MG tablet Take 4 mg by mouth daily before breakfast.    . metoprolol (LOPRESSOR) 50 MG tablet Take 2 tablets (100mg ) by mouth in the morning and 1 tablet (50mg ) in the evening. 270 tablet 2  . mometasone (ELOCON) 0.1 % cream APPLY TO LEGS AFTER  BATHING.  0  . Multiple Vitamins-Minerals (PRESERVISION AREDS) CAPS Take 1 capsule by mouth 2 (two) times daily.    . polyethylene glycol (MIRALAX / GLYCOLAX) packet Take 17 g by mouth daily. To prevent constipation    . potassium chloride (K-DUR) 10 MEQ tablet Take 1 tablet (10 mEq total) by mouth daily. 90 tablet 3  . sitaGLIPtin (JANUVIA) 100 MG tablet Take 50 mg by mouth daily.     Marland Kitchen triamcinolone cream (KENALOG) 0.1 % as needed.  3  . valsartan (DIOVAN) 160 MG tablet Take 1 tablet (160 mg total) by mouth daily. 90 tablet 2   No current facility-administered medications for this visit.    Allergies:   Review of patient's allergies indicates no known allergies.    Social History:  The patient  reports that he quit smoking about 52 years ago. He does not have any smokeless tobacco history on file. He reports that he drinks alcohol. He reports that he does not use illicit drugs.     ROS:  Please see the history of present illness.    Otherwise, review of systems positive for none.   All other systems are reviewed and negative.    PHYSICAL EXAM: VS:  BP 138/74 mmHg  Pulse 70  Resp 16  Ht 5\' 8"  (1.727 m)  Wt 195 lb 14.4 oz (88.86 kg)  BMI 29.79 kg/m2 , BMI Body mass index is 29.79 kg/(m^2).  General: Alert, oriented x3, no distress Head: no evidence of trauma, PERRL, EOMI, no exophtalmos or lid lag, no myxedema, no xanthelasma; normal ears, nose and oropharynx Neck: normal jugular venous pulsations and no hepatojugular reflux; brisk carotid pulses without delay and no carotid bruits Chest: clear to auscultation, no signs of consolidation by percussion or palpation, normal fremitus, symmetrical and full respiratory excursions, healthy left subclavian pacemaker site Cardiovascular: normal position and quality of the apical impulse, regular rhythm, normal first andparadoxically split second heart sounds, no  murmurs, rubs or gallops Abdomen: no tenderness or distention, no masses by  palpation, no abnormal pulsatility or arterial bruits, normal bowel sounds, no hepatosplenomegaly Extremities: no clubbing, cyanosis or edema; 2+ radial, ulnar and brachial pulses bilaterally; 2+ right femoral, posterior tibial and dorsalis pedis pulses; 2+ left femoral, posterior tibial and dorsalis pedis pulses; no subclavian or femoral bruits Neurological: grossly nonfocal Psych: euthymic mood, full affect   EKG:  EKG is ordered today. The ekg ordered today demonstrates atrial paced ventricular paced rhythm with occasional PVCs   Recent Labs: 03/28/2014: Magnesium 2.1 08/13/2014: BUN 29*; Creat 1.25; Potassium 4.8; Sodium 140    Lipid Panel No results found for: CHOL, TRIG, HDL, CHOLHDL, VLDL, LDLCALC, LDLDIRECT    Wt Readings from Last 3 Encounters:  01/21/15 195 lb 14.4 oz (88.86 kg)  07/30/14 195 lb 12.8 oz (88.814 kg)  07/02/14 194 lb 8 oz (88.225 kg)     .  ASSESSMENT AND PLAN:  Persistent atrial flutter/atrial fibrillation I believe this may now be a permanent arrhythmia with periods of atrial pacing due to atrial under sensing. Cardioversion and antiarrhythmic therapy did not appear to be justified in this 81 year old man without any clear symptoms attributable to the arrhythmia. He should continue anticoagulation as long as this is safe. He has not had bleeding complications. CHADSVasc at least 5.  Nonsustained VT Recent asymptomatic. Continue beta blocker.  CAD s/p CABG Recent evaluation has not shown evidence of active ischemia. Small apical infarct, no acute ischemia. On statin.   CHF with mildly reduced EF 44% Increased dose of loop diuretic in March, with resolution of signs and symptoms of hypervolemia. Continue beta blockers and angiotensin receptor blocker.  Complete heart block/pacemaker dependent Generally normal device function, although there is likely occasional atrial under sensing no reprogramming is necessary. His device should be checked at least  once every 6 months, but as we get closer to the recommended replacement interval in the next couple of years, will start checking every 3 months. I have repeatedly offered remote monitoring but he prefers in-office checks.     Current medicines are reviewed at length with the patient today.  The patient does not have concerns regarding medicines.  The following changes have been made:  no change  Labs/ tests ordered today include:   Orders Placed This Encounter  Procedures  . EKG 12-Lead    Patient Instructions  Your physician recommends that you schedule a follow-up appointment in: 3 month device check at [facility].  Dr. Sallyanne Kuster recommends that you schedule a follow-up appointment in: 6 months with device check - MDT (gray).         Mikael Spray, MD  01/21/2015 2:57 PM    [name], MD, [facility] [phone] office [phone] pager

## 2015-01-21 ENCOUNTER — Encounter: Payer: Self-pay | Admitting: Cardiovascular Disease

## 2015-01-21 ENCOUNTER — Ambulatory Visit (INDEPENDENT_AMBULATORY_CARE_PROVIDER_SITE_OTHER): Payer: Commercial Managed Care - HMO | Admitting: Cardiovascular Disease

## 2015-01-21 ENCOUNTER — Telehealth: Payer: Self-pay | Admitting: Cardiovascular Disease

## 2015-01-21 VITALS — BP 138/74 | HR 70 | Resp 16 | Ht 68.0 in | Wt 195.9 lb

## 2015-01-21 DIAGNOSIS — Z950 Presence of cardiac pacemaker: Secondary | ICD-10-CM

## 2015-01-21 DIAGNOSIS — E785 Hyperlipidemia, unspecified: Secondary | ICD-10-CM

## 2015-01-21 DIAGNOSIS — I442 Atrioventricular block, complete: Secondary | ICD-10-CM | POA: Diagnosis not present

## 2015-01-21 DIAGNOSIS — I10 Essential (primary) hypertension: Secondary | ICD-10-CM | POA: Diagnosis not present

## 2015-01-21 DIAGNOSIS — I472 Ventricular tachycardia, unspecified: Secondary | ICD-10-CM

## 2015-01-21 DIAGNOSIS — I2510 Atherosclerotic heart disease of native coronary artery without angina pectoris: Secondary | ICD-10-CM

## 2015-01-21 DIAGNOSIS — I455 Other specified heart block: Secondary | ICD-10-CM | POA: Diagnosis not present

## 2015-01-21 DIAGNOSIS — I4892 Unspecified atrial flutter: Secondary | ICD-10-CM

## 2015-01-21 DIAGNOSIS — I4729 Other ventricular tachycardia: Secondary | ICD-10-CM

## 2015-01-21 DIAGNOSIS — I25118 Atherosclerotic heart disease of native coronary artery with other forms of angina pectoris: Secondary | ICD-10-CM

## 2015-01-21 DIAGNOSIS — I5033 Acute on chronic diastolic (congestive) heart failure: Secondary | ICD-10-CM

## 2015-01-21 NOTE — Telephone Encounter (Signed)
Amount taken daily noted as 50mg  during today's visit.

## 2015-01-21 NOTE — Telephone Encounter (Signed)
Januvia is 50 mg one a day,instead of 100 mg.

## 2015-01-21 NOTE — Patient Instructions (Signed)
Your physician recommends that you schedule a follow-up appointment in: 3 month device check at [facility].  Dr. Sallyanne Kuster recommends that you schedule a follow-up appointment in: 6 months with device check - MDT (gray).

## 2015-01-28 DIAGNOSIS — E1165 Type 2 diabetes mellitus with hyperglycemia: Secondary | ICD-10-CM | POA: Diagnosis not present

## 2015-01-28 DIAGNOSIS — E782 Mixed hyperlipidemia: Secondary | ICD-10-CM | POA: Diagnosis not present

## 2015-01-31 DIAGNOSIS — C61 Malignant neoplasm of prostate: Secondary | ICD-10-CM | POA: Diagnosis not present

## 2015-01-31 DIAGNOSIS — E1169 Type 2 diabetes mellitus with other specified complication: Secondary | ICD-10-CM | POA: Diagnosis not present

## 2015-01-31 DIAGNOSIS — I2510 Atherosclerotic heart disease of native coronary artery without angina pectoris: Secondary | ICD-10-CM | POA: Diagnosis not present

## 2015-01-31 DIAGNOSIS — N183 Chronic kidney disease, stage 3 (moderate): Secondary | ICD-10-CM | POA: Diagnosis not present

## 2015-01-31 DIAGNOSIS — E782 Mixed hyperlipidemia: Secondary | ICD-10-CM | POA: Diagnosis not present

## 2015-01-31 DIAGNOSIS — E1165 Type 2 diabetes mellitus with hyperglycemia: Secondary | ICD-10-CM | POA: Diagnosis not present

## 2015-01-31 DIAGNOSIS — E1122 Type 2 diabetes mellitus with diabetic chronic kidney disease: Secondary | ICD-10-CM | POA: Diagnosis not present

## 2015-01-31 DIAGNOSIS — E559 Vitamin D deficiency, unspecified: Secondary | ICD-10-CM | POA: Diagnosis not present

## 2015-02-01 LAB — CUP PACEART INCLINIC DEVICE CHECK
Battery Impedance: 1219 Ohm
Battery Remaining Longevity: 33 mo
Brady Statistic AP VP Percent: 81 %
Brady Statistic AP VS Percent: 1 %
Brady Statistic AS VP Percent: 17 %
Brady Statistic AS VS Percent: 2 %
Date Time Interrogation Session: 20160913102829
Lead Channel Impedance Value: 478 Ohm
Lead Channel Impedance Value: 520 Ohm
Lead Channel Pacing Threshold Amplitude: 0.625 V
Lead Channel Pacing Threshold Amplitude: 1.125 V
Lead Channel Pacing Threshold Pulse Width: 0.4 ms
Lead Channel Setting Pacing Amplitude: 2.25 V
Lead Channel Setting Pacing Amplitude: 2.5 V
Lead Channel Setting Pacing Pulse Width: 0.4 ms
Lead Channel Setting Sensing Sensitivity: 5.6 mV
MDC IDC MSMT BATTERY VOLTAGE: 2.76 V
MDC IDC MSMT LEADCHNL RV PACING THRESHOLD PULSEWIDTH: 0.4 ms

## 2015-02-06 DIAGNOSIS — D0421 Carcinoma in situ of skin of right ear and external auricular canal: Secondary | ICD-10-CM | POA: Diagnosis not present

## 2015-02-19 ENCOUNTER — Inpatient Hospital Stay (HOSPITAL_COMMUNITY)
Admission: EM | Admit: 2015-02-19 | Discharge: 2015-02-21 | DRG: 194 | Disposition: A | Discharge status: Home / Self Care | Payer: Commercial Managed Care - HMO | Attending: Internal Medicine | Admitting: Internal Medicine

## 2015-02-19 ENCOUNTER — Emergency Department (HOSPITAL_COMMUNITY): Payer: Commercial Managed Care - HMO

## 2015-02-19 ENCOUNTER — Encounter (HOSPITAL_COMMUNITY): Payer: Self-pay | Admitting: Emergency Medicine

## 2015-02-19 DIAGNOSIS — I442 Atrioventricular block, complete: Secondary | ICD-10-CM | POA: Diagnosis not present

## 2015-02-19 DIAGNOSIS — Z950 Presence of cardiac pacemaker: Secondary | ICD-10-CM | POA: Diagnosis not present

## 2015-02-19 DIAGNOSIS — Z87891 Personal history of nicotine dependence: Secondary | ICD-10-CM

## 2015-02-19 DIAGNOSIS — Z8546 Personal history of malignant neoplasm of prostate: Secondary | ICD-10-CM

## 2015-02-19 DIAGNOSIS — I5032 Chronic diastolic (congestive) heart failure: Secondary | ICD-10-CM | POA: Diagnosis present

## 2015-02-19 DIAGNOSIS — I110 Hypertensive heart disease with heart failure: Secondary | ICD-10-CM | POA: Diagnosis not present

## 2015-02-19 DIAGNOSIS — R3915 Urgency of urination: Secondary | ICD-10-CM | POA: Diagnosis not present

## 2015-02-19 DIAGNOSIS — Z951 Presence of aortocoronary bypass graft: Secondary | ICD-10-CM

## 2015-02-19 DIAGNOSIS — D6489 Other specified anemias: Secondary | ICD-10-CM | POA: Diagnosis present

## 2015-02-19 DIAGNOSIS — I2510 Atherosclerotic heart disease of native coronary artery without angina pectoris: Secondary | ICD-10-CM | POA: Diagnosis not present

## 2015-02-19 DIAGNOSIS — Z79899 Other long term (current) drug therapy: Secondary | ICD-10-CM | POA: Diagnosis not present

## 2015-02-19 DIAGNOSIS — I10 Essential (primary) hypertension: Secondary | ICD-10-CM | POA: Diagnosis present

## 2015-02-19 DIAGNOSIS — J189 Pneumonia, unspecified organism: Secondary | ICD-10-CM | POA: Diagnosis not present

## 2015-02-19 DIAGNOSIS — Z7902 Long term (current) use of antithrombotics/antiplatelets: Secondary | ICD-10-CM

## 2015-02-19 DIAGNOSIS — E861 Hypovolemia: Secondary | ICD-10-CM | POA: Diagnosis present

## 2015-02-19 DIAGNOSIS — E872 Acidosis: Secondary | ICD-10-CM | POA: Diagnosis present

## 2015-02-19 DIAGNOSIS — N179 Acute kidney failure, unspecified: Secondary | ICD-10-CM | POA: Diagnosis present

## 2015-02-19 DIAGNOSIS — E860 Dehydration: Secondary | ICD-10-CM | POA: Diagnosis present

## 2015-02-19 DIAGNOSIS — R350 Frequency of micturition: Secondary | ICD-10-CM | POA: Diagnosis not present

## 2015-02-19 DIAGNOSIS — I4891 Unspecified atrial fibrillation: Secondary | ICD-10-CM | POA: Diagnosis not present

## 2015-02-19 DIAGNOSIS — R509 Fever, unspecified: Secondary | ICD-10-CM | POA: Diagnosis not present

## 2015-02-19 DIAGNOSIS — J209 Acute bronchitis, unspecified: Secondary | ICD-10-CM | POA: Diagnosis present

## 2015-02-19 DIAGNOSIS — F0390 Unspecified dementia without behavioral disturbance: Secondary | ICD-10-CM | POA: Diagnosis present

## 2015-02-19 DIAGNOSIS — I4892 Unspecified atrial flutter: Secondary | ICD-10-CM | POA: Diagnosis present

## 2015-02-19 DIAGNOSIS — E1165 Type 2 diabetes mellitus with hyperglycemia: Secondary | ICD-10-CM | POA: Diagnosis present

## 2015-02-19 DIAGNOSIS — Z7984 Long term (current) use of oral hypoglycemic drugs: Secondary | ICD-10-CM | POA: Diagnosis not present

## 2015-02-19 DIAGNOSIS — R05 Cough: Secondary | ICD-10-CM | POA: Diagnosis not present

## 2015-02-19 DIAGNOSIS — E785 Hyperlipidemia, unspecified: Secondary | ICD-10-CM | POA: Diagnosis present

## 2015-02-19 DIAGNOSIS — I25118 Atherosclerotic heart disease of native coronary artery with other forms of angina pectoris: Secondary | ICD-10-CM | POA: Diagnosis not present

## 2015-02-19 DIAGNOSIS — E119 Type 2 diabetes mellitus without complications: Secondary | ICD-10-CM

## 2015-02-19 HISTORY — DX: Malignant (primary) neoplasm, unspecified: C80.1

## 2015-02-19 HISTORY — DX: Unspecified atrial fibrillation: I48.91

## 2015-02-19 HISTORY — DX: Malignant neoplasm of prostate: C61

## 2015-02-19 LAB — CBC
HCT: 34.8 % — ABNORMAL LOW (ref 39.0–52.0)
HEMOGLOBIN: 10.9 g/dL — AB (ref 13.0–17.0)
MCH: 31 pg (ref 26.0–34.0)
MCHC: 31.3 g/dL (ref 30.0–36.0)
MCV: 98.9 fL (ref 78.0–100.0)
Platelets: 178 10*3/uL (ref 150–400)
RBC: 3.52 MIL/uL — ABNORMAL LOW (ref 4.22–5.81)
RDW: 15 % (ref 11.5–15.5)
WBC: 11.9 10*3/uL — ABNORMAL HIGH (ref 4.0–10.5)

## 2015-02-19 LAB — COMPREHENSIVE METABOLIC PANEL
ALBUMIN: 3.3 g/dL — AB (ref 3.5–5.0)
ALK PHOS: 113 U/L (ref 38–126)
ALT: 21 U/L (ref 17–63)
ANION GAP: 11 (ref 5–15)
AST: 26 U/L (ref 15–41)
BILIRUBIN TOTAL: 0.7 mg/dL (ref 0.3–1.2)
BUN: 28 mg/dL — AB (ref 6–20)
CALCIUM: 8.2 mg/dL — AB (ref 8.9–10.3)
CO2: 23 mmol/L (ref 22–32)
Chloride: 100 mmol/L — ABNORMAL LOW (ref 101–111)
Creatinine, Ser: 1.72 mg/dL — ABNORMAL HIGH (ref 0.61–1.24)
GFR calc Af Amer: 39 mL/min — ABNORMAL LOW (ref >60)
GFR, EST NON AFRICAN AMERICAN: 33 mL/min — AB (ref >60)
GLUCOSE: 427 mg/dL — AB (ref 65–99)
POTASSIUM: 4.2 mmol/L (ref 3.5–5.1)
Sodium: 134 mmol/L — ABNORMAL LOW (ref 135–145)
TOTAL PROTEIN: 6.5 g/dL (ref 6.5–8.1)

## 2015-02-19 LAB — I-STAT CG4 LACTIC ACID, ED
LACTIC ACID, VENOUS: 2.87 mmol/L — AB (ref 0.5–2.0)
LACTIC ACID, VENOUS: 2.33 mmol/L — AB (ref 0.5–2.0)

## 2015-02-19 LAB — GLUCOSE, CAPILLARY: Glucose-Capillary: 356 mg/dL — ABNORMAL HIGH (ref 65–99)

## 2015-02-19 LAB — CBG MONITORING, ED: Glucose-Capillary: 409 mg/dL — ABNORMAL HIGH (ref 65–99)

## 2015-02-19 MED ORDER — SODIUM CHLORIDE 0.9 % IV SOLN
INTRAVENOUS | Status: DC
  Administered 2015-02-19 – 2015-02-20 (×3): via INTRAVENOUS

## 2015-02-19 MED ORDER — DEXTROSE 5 % IV SOLN
1.0000 g | INTRAVENOUS | Status: DC
  Administered 2015-02-19 – 2015-02-20 (×2): 1 g via INTRAVENOUS
  Filled 2015-02-19 (×3): qty 10

## 2015-02-19 MED ORDER — LINAGLIPTIN 5 MG PO TABS
5.0000 mg | ORAL_TABLET | Freq: Every day | ORAL | Status: DC
  Administered 2015-02-20 – 2015-02-21 (×2): 5 mg via ORAL
  Filled 2015-02-19 (×2): qty 1

## 2015-02-19 MED ORDER — INSULIN ASPART 100 UNIT/ML ~~LOC~~ SOLN
0.0000 [IU] | Freq: Three times a day (TID) | SUBCUTANEOUS | Status: DC
  Administered 2015-02-20: 4 [IU] via SUBCUTANEOUS
  Administered 2015-02-20: 7 [IU] via SUBCUTANEOUS
  Administered 2015-02-20 – 2015-02-21 (×3): 4 [IU] via SUBCUTANEOUS

## 2015-02-19 MED ORDER — APIXABAN 5 MG PO TABS
5.0000 mg | ORAL_TABLET | Freq: Two times a day (BID) | ORAL | Status: DC
  Administered 2015-02-19: 5 mg via ORAL
  Filled 2015-02-19: qty 1

## 2015-02-19 MED ORDER — INFLUENZA VAC SPLIT QUAD 0.5 ML IM SUSY
0.5000 mL | PREFILLED_SYRINGE | INTRAMUSCULAR | Status: AC
  Administered 2015-02-20: 0.5 mL via INTRAMUSCULAR
  Filled 2015-02-19: qty 0.5

## 2015-02-19 MED ORDER — DEXTROSE 5 % IV SOLN
500.0000 mg | INTRAVENOUS | Status: DC
  Administered 2015-02-19 – 2015-02-20 (×2): 500 mg via INTRAVENOUS
  Filled 2015-02-19 (×3): qty 500

## 2015-02-19 MED ORDER — METOPROLOL TARTRATE 50 MG PO TABS
50.0000 mg | ORAL_TABLET | Freq: Every day | ORAL | Status: DC
  Administered 2015-02-20: 50 mg via ORAL
  Filled 2015-02-19: qty 1

## 2015-02-19 MED ORDER — INSULIN ASPART 100 UNIT/ML ~~LOC~~ SOLN
0.0000 [IU] | Freq: Every day | SUBCUTANEOUS | Status: DC
  Administered 2015-02-19: 5 [IU] via SUBCUTANEOUS
  Administered 2015-02-20: 2 [IU] via SUBCUTANEOUS

## 2015-02-19 MED ORDER — ATORVASTATIN CALCIUM 20 MG PO TABS
20.0000 mg | ORAL_TABLET | Freq: Every day | ORAL | Status: DC
  Administered 2015-02-20 – 2015-02-21 (×2): 20 mg via ORAL
  Filled 2015-02-19 (×2): qty 1

## 2015-02-19 MED ORDER — METOPROLOL TARTRATE 100 MG PO TABS
100.0000 mg | ORAL_TABLET | Freq: Every day | ORAL | Status: DC
  Administered 2015-02-20 – 2015-02-21 (×2): 100 mg via ORAL
  Filled 2015-02-19 (×2): qty 1

## 2015-02-19 NOTE — Progress Notes (Signed)
Called ER RN for report. Room ready.  

## 2015-02-19 NOTE — ED Provider Notes (Signed)
CSN: [id number]     Arrival date & time 02/19/15  1907 History   First MD Initiated Contact with Patient 02/19/15 1948     Chief Complaint  Patient presents with  . Cough  . Fever     (Consider location/radiation/quality/duration/timing/severity/associated sxs/prior Treatment) HPI Comments: Patient here complaining of cough 2 weeks that was worse today and associated with shaking chills. Patient had an episode of choking with possible aspiration which occurred today. Here temperature at home of 101.2 and patient did not take any medications for this. Denies any vomiting or fever. Has had increased weakness. Patient has baseline urinary frequency and urgency. Denies any abdominal or flank pain. No rashes. No neck pain photophobia. Symptoms persistent and nothing makes them better.  Patient is a 81 y.o. male presenting with cough and fever. The history is provided by the patient, the spouse and a relative.  Cough Associated symptoms: fever   Fever Associated symptoms: cough     Past Medical History  Diagnosis Date  . Diabetes mellitus   . Hypertension   . Pacemaker 12/12/2006    Medtronic adapta  . Coronary artery disease   . 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 CHB (complete heart block) ([place])   . Dyslipidemia   . H/O prostate cancer   . Ventricular tachycardia (paroxysmal) ([place]) 03/28/2014  . New onset atrial flutter, persistent 07/04/2014  . Atrial fibrillation ([place])   . Cancer ([place])   . Prostate cancer [facility])    Past Surgical History  Procedure Laterality Date  . Pacemaker insertion  12/12/2006    Medtronic adapta  . Coronary artery bypass graft  09/04/2001    LIMA to LAD,SVG to diagonal,SVG to ramus intermedius,SVG to PDA  . Prostate surgery  2001    cancer  . Nm myoview ltd  01/09/2010    no ischemia   No family history on file. Social History  Substance Use Topics  . Smoking status: Former Smoker    Quit date:  05/14/1962  . Smokeless tobacco: None  . Alcohol Use: Yes     Comment: seldom    Review of Systems  Constitutional: Positive for fever.  Respiratory: Positive for cough.   All other systems reviewed and are negative.     Allergies  Review of patient's allergies indicates no known allergies.  Home Medications   Prior to Admission medications   Medication Sig Start Date End Date Taking? Authorizing Provider  atorvastatin (LIPITOR) 20 MG tablet Take 20 mg by mouth daily.    Historical Provider, MD  Cholecalciferol (VITAMIN D PO) Take 10,000 Units by mouth every 7 (seven) days. On Friday    Historical Provider, MD  ELIQUIS 5 MG TABS tablet TAKE 1 TABLET (5 MG TOTAL) BY MOUTH 2 (TWO) TIMES DAILY. 01/06/15   [name], MD  furosemide (LASIX) 40 MG tablet Take 1 tablet (40 mg total) by mouth daily. 07/30/14   [name], MD  glimepiride (AMARYL) 4 MG tablet Take 4 mg by mouth daily before breakfast.    Historical Provider, MD  metoprolol (LOPRESSOR) 50 MG tablet Take 2 tablets (100mg ) by mouth in the morning and 1 tablet (50mg ) in the evening. 06/21/14   [name], MD  mometasone (ELOCON) 0.1 % cream APPLY TO LEGS AFTER BATHING. 01/10/15   Historical Provider, MD  Multiple Vitamins-Minerals (PRESERVISION AREDS) CAPS Take 1 capsule by mouth 2 (two) times daily.    Historical  Provider, MD  polyethylene glycol (MIRALAX / GLYCOLAX) packet Take 17 g by mouth daily. To prevent constipation    Historical Provider, MD  potassium chloride (K-DUR) 10 MEQ tablet Take 1 tablet (10 mEq total) by mouth daily. 07/30/14   [name], MD  sitaGLIPtin (JANUVIA) 100 MG tablet Take 50 mg by mouth daily.     Historical Provider, MD  triamcinolone cream (KENALOG) 0.1 % as needed. 04/08/14   Historical Provider, MD  valsartan (DIOVAN) 160 MG tablet Take 1 tablet (160 mg total) by mouth daily. 06/21/14   [name], MD   BP 130/57 mmHg  Pulse 67  Temp(Src) 99.6 F (37.6 C) (Oral)  Resp 14   SpO2 91% Physical Exam  Constitutional: He is oriented to person, place, and time. He appears well-developed and well-nourished.  Non-toxic appearance. No distress.  HENT:  Head: Normocephalic and atraumatic.  Eyes: Conjunctivae, EOM and lids are normal. Pupils are equal, round, and reactive to light.  Neck: Normal range of motion. Neck supple. No tracheal deviation present. No thyroid mass present.  Cardiovascular: Normal rate, regular rhythm and normal heart sounds.  Exam reveals no gallop.   No murmur heard. Pulmonary/Chest: Effort normal. No stridor. No respiratory distress. He has decreased breath sounds in the right upper field, the right lower field, the left upper field and the left lower field. He has no wheezes. He has rhonchi in the right upper field, the right lower field, the left upper field and the left lower field. He has no rales.  Abdominal: Soft. Normal appearance and bowel sounds are normal. He exhibits no distension. There is no tenderness. There is no rebound and no CVA tenderness.  Musculoskeletal: Normal range of motion. He exhibits no edema or tenderness.  Neurological: He is alert and oriented to person, place, and time. He has normal strength. No cranial nerve deficit or sensory deficit. GCS eye subscore is 4. GCS verbal subscore is 5. GCS motor subscore is 6.  Skin: Skin is warm and dry. No abrasion and no rash noted.  Psychiatric: He has a normal mood and affect. His speech is normal and behavior is normal.  Nursing note and vitals reviewed.   ED Course  Procedures (including critical care time) Labs Review Labs Reviewed  CBG MONITORING, ED - Abnormal; Notable for the following:    Glucose-Capillary 409 (*)    All other components within normal limits  URINE CULTURE  CULTURE, BLOOD (ROUTINE X 2)  CULTURE, BLOOD (ROUTINE X 2)  COMPREHENSIVE METABOLIC PANEL  URINALYSIS, ROUTINE W REFLEX MICROSCOPIC (NOT AT [facility])  CBC  I-STAT CG4 LACTIC ACID, ED  CBG  MONITORING, ED    Imaging Review No results found. I have personally reviewed and evaluated these images and lab results as part of my medical decision-making.   EKG Interpretation   Date/Time:  Wednesday February 19 2015 20:48:04 EDT Ventricular Rate:  61 PR Interval:    QRS Duration: 175 QT Interval:  459 QTC Calculation: 462 R Axis:   -56 Text Interpretation:  paced rythm Ventricular premature complex  Nonspecific IVCD with LAD LVH with secondary repolarization abnormality No  significant change since last tracing Confirmed by Ranessa Kosta  MD, Graciemae Delisle  ([id number]) on 02/19/2015 10:20:12 PM      MDM   Final diagnoses:  None    Patient placed on antibiotics for suspected community acquired pneumonia. Will be admitted to the hospitalist service    Lacretia Leigh, MD 02/19/15 2220

## 2015-02-19 NOTE — ED Notes (Addendum)
Family reports pt shaking this afternoon with chills.  TMax at home 102.4.  Daughter reports pt was eating at [facility] today for lunch and got choked on his food.  Reports productive cough with clear phlegm x approximately 3 weeks.  Blood sugars have been running high per family.  Metformin started in September.  Pt has not taken Tylenol or Ibuprofen today.

## 2015-02-19 NOTE — ED Notes (Signed)
Pt placed on 2L [place] oxygen d/t SPO2 of 90-91% on room air.

## 2015-02-19 NOTE — ED Notes (Signed)
Attempted to obtain urine from pt but pt had an episode of incontinence. Placed pt in a brief and changed linen with family assistance. Pt repositioned and given urinal if pt felt the urgent need to urinate. Phlebotomy bedside. Pt given yellow wristband and socks.

## 2015-02-19 NOTE — ED Notes (Signed)
Hospitalist bedside 

## 2015-02-20 DIAGNOSIS — E860 Dehydration: Secondary | ICD-10-CM | POA: Diagnosis not present

## 2015-02-20 DIAGNOSIS — N179 Acute kidney failure, unspecified: Secondary | ICD-10-CM | POA: Diagnosis not present

## 2015-02-20 DIAGNOSIS — I4891 Unspecified atrial fibrillation: Secondary | ICD-10-CM | POA: Diagnosis not present

## 2015-02-20 DIAGNOSIS — J189 Pneumonia, unspecified organism: Secondary | ICD-10-CM | POA: Diagnosis not present

## 2015-02-20 DIAGNOSIS — E1165 Type 2 diabetes mellitus with hyperglycemia: Secondary | ICD-10-CM | POA: Diagnosis not present

## 2015-02-20 DIAGNOSIS — I442 Atrioventricular block, complete: Secondary | ICD-10-CM | POA: Diagnosis not present

## 2015-02-20 DIAGNOSIS — I110 Hypertensive heart disease with heart failure: Secondary | ICD-10-CM | POA: Diagnosis not present

## 2015-02-20 DIAGNOSIS — F0390 Unspecified dementia without behavioral disturbance: Secondary | ICD-10-CM | POA: Diagnosis not present

## 2015-02-20 DIAGNOSIS — E872 Acidosis: Secondary | ICD-10-CM | POA: Diagnosis not present

## 2015-02-20 LAB — BASIC METABOLIC PANEL
ANION GAP: 11 (ref 5–15)
BUN: 31 mg/dL — ABNORMAL HIGH (ref 6–20)
CHLORIDE: 103 mmol/L (ref 101–111)
CO2: 26 mmol/L (ref 22–32)
Calcium: 8 mg/dL — ABNORMAL LOW (ref 8.9–10.3)
Creatinine, Ser: 1.7 mg/dL — ABNORMAL HIGH (ref 0.61–1.24)
GFR calc non Af Amer: 34 mL/min — ABNORMAL LOW (ref >60)
GFR, EST AFRICAN AMERICAN: 39 mL/min — AB (ref >60)
GLUCOSE: 262 mg/dL — AB (ref 65–99)
POTASSIUM: 3.9 mmol/L (ref 3.5–5.1)
Sodium: 140 mmol/L (ref 135–145)

## 2015-02-20 LAB — CBC
HEMATOCRIT: 30.6 % — AB (ref 39.0–52.0)
HEMOGLOBIN: 9.7 g/dL — AB (ref 13.0–17.0)
MCH: 31.4 pg (ref 26.0–34.0)
MCHC: 31.7 g/dL (ref 30.0–36.0)
MCV: 99 fL (ref 78.0–100.0)
Platelets: 152 10*3/uL (ref 150–400)
RBC: 3.09 MIL/uL — AB (ref 4.22–5.81)
RDW: 14.9 % (ref 11.5–15.5)
WBC: 13.5 10*3/uL — AB (ref 4.0–10.5)

## 2015-02-20 LAB — BRAIN NATRIURETIC PEPTIDE: B NATRIURETIC PEPTIDE 5: 592.5 pg/mL — AB (ref 0.0–100.0)

## 2015-02-20 LAB — URINALYSIS, ROUTINE W REFLEX MICROSCOPIC
BILIRUBIN URINE: NEGATIVE
KETONES UR: NEGATIVE mg/dL
LEUKOCYTES UA: NEGATIVE
Nitrite: NEGATIVE
PROTEIN: NEGATIVE mg/dL
Specific Gravity, Urine: 1.02 (ref 1.005–1.030)
Urobilinogen, UA: 1 mg/dL (ref 0.0–1.0)
pH: 5 (ref 5.0–8.0)

## 2015-02-20 LAB — GLUCOSE, CAPILLARY
GLUCOSE-CAPILLARY: 171 mg/dL — AB (ref 65–99)
GLUCOSE-CAPILLARY: 233 mg/dL — AB (ref 65–99)
Glucose-Capillary: 209 mg/dL — ABNORMAL HIGH (ref 65–99)
Glucose-Capillary: 188 mg/dL — ABNORMAL HIGH (ref 65–99)
Glucose-Capillary: 230 mg/dL — ABNORMAL HIGH (ref 65–99)

## 2015-02-20 LAB — CREATININE, URINE, RANDOM: CREATININE, URINE: 104.13 mg/dL

## 2015-02-20 LAB — LACTIC ACID, PLASMA
LACTIC ACID, VENOUS: 1.6 mmol/L (ref 0.5–2.0)
Lactic Acid, Venous: 2.5 mmol/L (ref 0.5–2.0)

## 2015-02-20 LAB — URINE MICROSCOPIC-ADD ON

## 2015-02-20 LAB — TROPONIN I

## 2015-02-20 LAB — STREP PNEUMONIAE URINARY ANTIGEN: STREP PNEUMO URINARY ANTIGEN: NEGATIVE

## 2015-02-20 LAB — PROCALCITONIN: PROCALCITONIN: 0.57 ng/mL

## 2015-02-20 MED ORDER — POLYETHYLENE GLYCOL 3350 17 G PO PACK
17.0000 g | PACK | Freq: Every day | ORAL | Status: DC
  Filled 2015-02-20: qty 1

## 2015-02-20 MED ORDER — APIXABAN 2.5 MG PO TABS
2.5000 mg | ORAL_TABLET | Freq: Two times a day (BID) | ORAL | Status: DC
  Administered 2015-02-20 – 2015-02-21 (×3): 2.5 mg via ORAL
  Filled 2015-02-20 (×3): qty 1

## 2015-02-20 MED ORDER — SODIUM CHLORIDE 0.9 % IV BOLUS (SEPSIS)
500.0000 mL | Freq: Once | INTRAVENOUS | Status: AC
  Administered 2015-02-20: 500 mL via INTRAVENOUS

## 2015-02-20 MED ORDER — SODIUM CHLORIDE 0.9 % IV BOLUS (SEPSIS): 1000.0000 mL | Freq: Once | INTRAVENOUS | Status: DC

## 2015-02-20 NOTE — Progress Notes (Signed)
Source lung. Organism unknown. Patient does not appear to meet SIRS criteria, and his lactic acidosis has resolved.  His AKI is being followed.  The MAP is > 65.  He has been treated with IV antibiotics. Stabilized. Lactate trending down.  Glucose improving.  Repeat renal function and complete blood count in AM.  Remain in Med-Surg floor for now.

## 2015-02-20 NOTE — H&P (Signed)
History and Physical  [name] Powell  [id number]  DOB: 08-09-1922  DOA: 02/19/2015  Referring physician: Vivi Martens, MD PCP: [name] Heck, MD   Chief Complaint: Cough, chills  HPI: [name] Powell is a 81 y.o. male with a past medical history significant for mild dementia, CAD status post CABG in 2003, complete heart block with pacer, atrial flutter, HTN, NIDDM, history of prostate cancer, and chronic diastolic heart failure who presents with cough, fever, and chills.  The patient was in his usual state of health until this afternoon when the patient developed cough, fever, chills, and weakness. History is collected largely from the patient's wife and daughter who were present at the bedside. The daughter reports an episode today which the patient was eating at [facility] and started choking on food and got very red in the face and she is afraid he aspirated.  In the ED, the patient was hemodynamically stable, mildly hypoxic, and a low-grade fever. His WBC was slightly elevated, and he had a cath. His lactic acid was elevated, and [facility] was asked to admit for suspected CAP and dehydration.   Review of Systems:  Patient seen 10:45 PM on 02/19/2015. Pt complains of cough, fever, chills, weakness, incontinence, choking, nausea, malaise. All other systems negative except as just noted or noted in the history of present illness.  Past Medical History  Diagnosis Date  . Diabetes mellitus   . Hypertension   . Pacemaker 12/12/2006    Medtronic adapta  . Coronary artery disease   . 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 CHB (complete heart block) ([place])   . Dyslipidemia   . H/O prostate cancer   . Ventricular tachycardia (paroxysmal) ([place]) 03/28/2014  . New onset atrial flutter, persistent 07/04/2014  . Atrial fibrillation ([place])   . Cancer ([place])   . Prostate cancer [facility])   The above past medical history was  reviewed.  Past Surgical History  Procedure Laterality Date  . Pacemaker insertion  12/12/2006    Medtronic adapta  . Coronary artery bypass graft  09/04/2001    LIMA to LAD,SVG to diagonal,SVG to ramus intermedius,SVG to PDA  . Prostate surgery  2001    cancer  . Nm myoview ltd  01/09/2010    no ischemia  The above surgical history was reviewed.  Social History: Patient lives with his wife. He walks with a walker at baseline. He is a remote former smoker. He used to be an Art gallery manager.    No Known Allergies  Family History  Problem Relation Age of Onset  . Heart disease Mother     Prior to Admission medications   Medication Sig Start Date End Date Taking? Authorizing Provider  atorvastatin (LIPITOR) 20 MG tablet Take 20 mg by mouth daily.   Yes Historical Provider, MD  Cholecalciferol (VITAMIN D PO) Take 10,000 Units by mouth every 7 (seven) days. On Friday   Yes Historical Provider, MD  ELIQUIS 5 MG TABS tablet TAKE 1 TABLET (5 MG TOTAL) BY MOUTH 2 (TWO) TIMES DAILY. 01/06/15  Yes [name] Croitoru, MD  furosemide (LASIX) 40 MG tablet Take 1 tablet (40 mg total) by mouth daily. 07/30/14  Yes [name] Croitoru, MD  glimepiride (AMARYL) 4 MG tablet Take 4 mg by mouth daily before breakfast.   Yes Historical Provider, MD  metFORMIN (GLUCOPHAGE-XR) 500 MG 24 hr tablet Take 500 mg by mouth daily. 02/10/15  Yes Historical Provider,  MD  metoprolol (LOPRESSOR) 50 MG tablet Take 2 tablets (100mg ) by mouth in the morning and 1 tablet (50mg ) in the evening. 06/21/14  Yes [name] Croitoru, MD  mometasone (ELOCON) 0.1 % cream APPLY TO LEGS AS NEEDED FOR FOOT DERMATITIS 01/10/15  Yes Historical Provider, MD  Multiple Vitamins-Minerals (PRESERVISION AREDS) CAPS Take 1 capsule by mouth 2 (two) times daily.   Yes Historical Provider, MD  polyethylene glycol (MIRALAX / GLYCOLAX) packet Take 17 g by mouth daily. To prevent constipation   Yes Historical Provider, MD  potassium chloride (K-DUR) 10 MEQ tablet  Take 1 tablet (10 mEq total) by mouth daily. 07/30/14  Yes [name] Croitoru, MD  sitaGLIPtin (JANUVIA) 100 MG tablet Take 50 mg by mouth daily.    Yes Historical Provider, MD  valsartan (DIOVAN) 160 MG tablet Take 1 tablet (160 mg total) by mouth daily. 06/21/14  Yes [name] Croitoru, MD    Physical Exam: BP 124/56 mmHg  Pulse 62  Temp(Src) 98.6 F (37 C) (Oral)  Resp 20  Ht 5\' 8"  (1.727 m)  Wt 88.86 kg (195 lb 14.4 oz)  BMI 29.79 kg/m2  SpO2 100% General appearance: Elderly male, alert and in no acute distress.  Responds appropriately to questions.   Eyes: Sclerae normal without icterus, conjunctiva pink, lids and lashes normal.  PERRL and EOMI.   Nose: No deformity, discharge, or epistaxis.   Mouth: OP moist without erythema, exudates, cobblestoning, or ulcers.  No airway deformities.   Lymph: No cervical, supraclavicular lymphadenopathy. Skin: Warm and dry, no flushing.  No jaundice.  No suspicious rashes or lesions. Cardiac: Irregularly irregular, nl S1-S2.  Capillary refill is less than 2 seconds.  No LE edema.  Radial pulses 2+ and symmetric. Respiratory: Normal respiratory rate and rhythm.  Rales at bilateral bases, louder on left. Abdomen: BS present.  Abdomen soft without rigidity.  No TTP or rebound all quadrants. No ascites, distension.   Neuro: Sensorium intact.  Cranial nerves 3-12 intact.  Speech is fluent.  Attention and concentration are normal.  Memory seems moderately impaired.  Oriented to person place and time. Moves all extremities equally and with normal coordination.          Labs on Admission:  The metabolic panel is notable for normal sodium, potassium, bicarbonate. He is hyperglycemic. The serum creatinine is 1.72 mg/dL from a baseline of 1.2. The abdomen is 3.3 g/dL The complete blood count is notable for slight leukocytosis, stable normocytic anemia. The transaminases and bilirubin are normal. The lactic acid level is 2.87    Radiological Exams on  Admission: Personally reviewed: Dg Chest 2 View 02/19/2015   Cardiomegaly, suspect retrocardiac opacity   EKG: Independently reviewed. Atrial fibrillation with IVCD, rate controlled, similar to previous.    Assessment/Plan  1. CAP:  This is new.  The patient does not meet criteria for sepsis as he is not febrile, tachycardic, tachypneic, hypotensive, or hypoxic. He has slight leukocytosis.  At this time, the elevated lactic acid and AKI are suspected to be from dehydration. -Ceftriaxone and azithromycin -Incentive spirometry -Supplemental oxygen as needed -Procalcitonin is ordered -Sputum culture and Gram stain -Strep pneumo and legionella antigens -Blood cultures are pending  2. Elevated lactic acid:  This is new.  Suspect this is from hypovolemia. -Repeat lactic acid -Fluid resuscitation -Hold furosemide  3. AKI:  This is new. Suspect from hypovolemia. -Fluid resuscitation -Repeat BMP tomorrow -Urine lites -Hold metformin and furosemide and ARB  4. Chronic normocytic anemia:  Stable -Repeat CBC  5. CAD and chronic diastolic heart failure:  Stable. The patient's last ejection fraction was measured in 2015 was 55% -Cautious with fluid resuscitation, unless lactic acid is slow to resolve  6. A flutter:  Stable. Chest tube ask score is 5 -Continue Eliquis and metoprolol  7. HTN:  Stable.  -Continue metoprolol -Hold ARB and furosemide while AKI  8. NIDDM:  Not controlled. -Hold glimepiride -Hold metformin -Continue Januvia -Sliding scale corrections and every 4 hours Accuchecks     DVT PPx: Apixaban Diet: Cardiac Code Status: Full Family Communication: The patient's differential diagnosis, workup, and treatment plan were discussed with the patient's daughter and wife at the bedside. All questions were answered. CODE STATUS was confirmed.   Disposition Plan:  At the time of admission, it appears that the appropriate admission status for this patient is  INPATIENT. This is judged to be reasonable and necessary in order to provide the required intensity of service to ensure the patient's safety given the presenting symptoms, physical exam findings, and initial radiographic and laboratory data in the context of their chronic comorbidities.  Together, these circumstances are felt to place her/him at high risk for further clinical deterioration threatening life, limb, or organ. The following factors support the admission status of inpatient:   A. The patient's presenting symptoms include cough, fever, chills. B. The worrisome physical exam findings include Rales on lung exam. C. The initial radiographic and laboratory data are worrisome because of elevated lactic acid, elevated creatinine, hyperglycemia, elevated WBC D. The chronic co-morbidities include CAD, complete heart block, atrial flutter, HTN, NIDDM, prostate cancer, chronic diastolic heart failure. E. Patient requires inpatient status due to high intensity of service, high risk for further deterioration and high frequency of surveillance required. F. I certify that at the point of admission it is my clinical judgment that the patient will require inpatient hospital care spanning beyond 2 midnights from the point of admission.    Edwin Dada [facility] Pager [phone]

## 2015-02-21 DIAGNOSIS — I4892 Unspecified atrial flutter: Secondary | ICD-10-CM

## 2015-02-21 DIAGNOSIS — I5032 Chronic diastolic (congestive) heart failure: Secondary | ICD-10-CM

## 2015-02-21 DIAGNOSIS — I442 Atrioventricular block, complete: Secondary | ICD-10-CM

## 2015-02-21 DIAGNOSIS — I25118 Atherosclerotic heart disease of native coronary artery with other forms of angina pectoris: Secondary | ICD-10-CM

## 2015-02-21 DIAGNOSIS — E1142 Type 2 diabetes mellitus with diabetic polyneuropathy: Secondary | ICD-10-CM

## 2015-02-21 LAB — PROCALCITONIN: PROCALCITONIN: 0.62 ng/mL

## 2015-02-21 LAB — LEGIONELLA PNEUMOPHILA SEROGP 1 UR AG: L. PNEUMOPHILA SEROGP 1 UR AG: NEGATIVE

## 2015-02-21 LAB — GLUCOSE, CAPILLARY
GLUCOSE-CAPILLARY: 191 mg/dL — AB (ref 65–99)
Glucose-Capillary: 159 mg/dL — ABNORMAL HIGH (ref 65–99)

## 2015-02-21 LAB — UREA NITROGEN, URINE: Urea Nitrogen, Ur: 603 mg/dL

## 2015-02-21 LAB — URINE CULTURE: Culture: NO GROWTH

## 2015-02-21 MED ORDER — AZITHROMYCIN 500 MG PO TABS: 500.0000 mg | ORAL_TABLET | Freq: Every day | ORAL | Status: DC

## 2015-02-21 MED ORDER — AMOXICILLIN-POT CLAVULANATE 875-125 MG PO TABS: 1.0000 | ORAL_TABLET | Freq: Two times a day (BID) | ORAL | Status: DC

## 2015-02-21 MED ORDER — GUAIFENESIN ER 600 MG PO TB12: 1200.0000 mg | ORAL_TABLET | Freq: Two times a day (BID) | ORAL | Status: DC

## 2015-02-21 NOTE — Progress Notes (Signed)
Patient discharged home with family. Patient given prescriptions and discharge information. Patient educated on heart failure. Patient was discharged with belongings. Patient was stable upon discharge.

## 2015-02-21 NOTE — Care Management Important Message (Signed)
Important Message  Patient Details  Name: ANDRUW RAFFERTY MRN: [id number] Date of Birth: [birth year]   Medicare Important Message Given:  Yes-second notification given    Delorse Lek 02/21/2015, 11:08 AM

## 2015-02-21 NOTE — Progress Notes (Signed)
Utilization review completed. Shannin Naab, RN, BSN. 

## 2015-02-21 NOTE — Discharge Summary (Signed)
Physician Discharge Summary  [name] Powell [id number] DOB: 1922/10/11 DOA: 02/19/2015  PCP: [name], MD  Admit date: 02/19/2015 Discharge date: 02/21/2015  Time spent: 40 minutes  Recommendations for Outpatient Follow-up:  1. Follow-up with primary care physician in one week. 2. Check BMP in 1 week.  Discharge Diagnoses:  Principal Problem:   Community acquired pneumonia Active Problems:   Pacemaker dependent - Medtronic   CAD s/p CABG 2003   CHB (complete heart block) ([place])   DM2 (diabetes mellitus, type 2) ([place])   HTN (hypertension)   Chronic diastolic congestive heart failure (H[facility])   Atrial flutter ([place])   CAP (community acquired pneumonia)   Discharge Condition: Stable  Diet recommendation: Heart healthy  Filed Weights   02/19/15 2318 02/20/15 2109  Weight: 88.86 kg (195 lb 14.4 oz) 90.1 kg (198 lb 10.2 oz)    History of present illness:  [name] Powell is a 81 y.o. male with a past medical history significant for mild dementia, CAD status post CABG in 2003, complete heart block with pacer, atrial flutter, HTN, NIDDM, history of prostate cancer, and chronic diastolic heart failure who presents with cough, fever, and chills.  The patient was in his usual state of health until this afternoon when the patient developed cough, fever, chills, and weakness. History is collected largely from the patient's wife and daughter who were present at the bedside. The daughter reports an episode today which the patient was eating at [facility] and started choking on food and got very red in the face and she is afraid he aspirated.  In the ED, the patient was hemodynamically stable, mildly hypoxic, and a low-grade fever. His WBC was slightly elevated, and he had a cath. His lactic acid was elevated, and [facility] was asked to admit for suspected CAP and dehydration.  Hospital Course:   1. CAP, early versus acute bronchitis:  -Presented with cough, shortness of  breath and sputum production. -Does not appear to be septic, started on ceftriaxone and azithromycin. -Urine for Legionella and Streptococcus pneumoniae antigens  -Blood and sputum cultures obtained, NGTD. -Patient feels much better, chest x-ray did not show significant pneumonia, acute bronchitis versus early pneumonia. -Discharged on Augmentin for 5 more days, patient instructed to take Mucinex at home.  2. Elevated lactic acid:  -This is new. Suspect this is from hypovolemia, slightly elevated at 2.5, decreased to 1.6 after fluid hydration  3. AKI:  -Acute renal failure, likely secondary to hypovolemia and dehydration. -Fluid resuscitation -BMP is pending for today. Recommend to do BMP as outpatient. -Patient does not want stay nor his wife/son.  4. Chronic normocytic anemia:  Stable -Repeat CBC  5. CAD and chronic diastolic heart failure:  Stable. The patient's last ejection fraction was measured in 2015 was 55% -Cautious with fluid resuscitation, unless lactic acid is slow to resolve  6. A flutter:  -Stable, CHA2DS2-VASc is 5 -Continue Eliquis and metoprolol  7. HTN:  Stable.  -Continue metoprolol -Hold ARB and furosemide while AKI, restarted on discharge.  8. NIDDM:  Not controlled. -Hold glimepiride -Hold metformin -Continue Januvia -Sliding scale corrections and every 4 hours Accuchecks    Procedures:  None  Consultations:  None  Discharge Exam: Filed Vitals:   02/21/15 0934  BP: 125/55  Pulse: 65  Temp: 98 F (36.7 C)  Resp: 17   General: Alert and awake, oriented x3, not in any acute distress. HEENT: anicteric sclera, pupils reactive to light and accommodation, EOMI CVS: S1-S2 clear, no murmur  rubs or gallops Chest: clear to auscultation bilaterally, no wheezing, rales or rhonchi Abdomen: soft nontender, nondistended, normal bowel sounds, no organomegaly Extremities: no cyanosis, clubbing or edema noted bilaterally Neuro: Cranial  nerves II-XII intact, no focal neurological deficits   Discharge Instructions    Current Discharge Medication List    START taking these medications   Details  amoxicillin-clavulanate (AUGMENTIN) 875-125 MG tablet Take 1 tablet by mouth 2 (two) times daily. Qty: 10 tablet, Refills: 0    guaiFENesin (MUCINEX) 600 MG 12 hr tablet Take 2 tablets (1,200 mg total) by mouth 2 (two) times daily. Qty: 30 tablet, Refills: 0      CONTINUE these medications which have NOT CHANGED   Details  atorvastatin (LIPITOR) 20 MG tablet Take 20 mg by mouth daily.    Cholecalciferol (VITAMIN D PO) Take 10,000 Units by mouth every 7 (seven) days. On Friday    ELIQUIS 5 MG TABS tablet TAKE 1 TABLET (5 MG TOTAL) BY MOUTH 2 (TWO) TIMES DAILY. Qty: 180 tablet, Refills: 1    furosemide (LASIX) 40 MG tablet Take 1 tablet (40 mg total) by mouth daily. Qty: 90 tablet, Refills: 3    glimepiride (AMARYL) 4 MG tablet Take 4 mg by mouth daily before breakfast.    metFORMIN (GLUCOPHAGE-XR) 500 MG 24 hr tablet Take 500 mg by mouth daily.    metoprolol (LOPRESSOR) 50 MG tablet Take 2 tablets (100mg ) by mouth in the morning and 1 tablet (50mg ) in the evening. Qty: 270 tablet, Refills: 2    mometasone (ELOCON) 0.1 % cream APPLY TO LEGS AS NEEDED FOR FOOT DERMATITIS Refills: 0    Multiple Vitamins-Minerals (PRESERVISION AREDS) CAPS Take 1 capsule by mouth 2 (two) times daily.    polyethylene glycol (MIRALAX / GLYCOLAX) packet Take 17 g by mouth daily. To prevent constipation    potassium chloride (K-DUR) 10 MEQ tablet Take 1 tablet (10 mEq total) by mouth daily. Qty: 90 tablet, Refills: 3    sitaGLIPtin (JANUVIA) 100 MG tablet Take 50 mg by mouth daily.     valsartan (DIOVAN) 160 MG tablet Take 1 tablet (160 mg total) by mouth daily. Qty: 90 tablet, Refills: 2       No Known Allergies    The results of significant diagnostics from this hospitalization (including imaging, microbiology, ancillary and  laboratory) are listed below for reference.    Significant Diagnostic Studies: Dg Chest 2 View  02/19/2015  CLINICAL DATA:  Productive cough and fever EXAM: CHEST  2 VIEW COMPARISON:  July 02, 2014 FINDINGS: There is chronic left base scarring and pleural thickening. Lungs elsewhere clear. Heart is upper normal in size with pulmonary vascularity within normal limits. Pacemaker leads are attached to the right atrium right ventricle. Patient is status post coronary artery bypass grafting. No adenopathy. There is degenerative change in the thoracic spine. IMPRESSION: Scarring and pleural thickening left base, stable. No new opacity is seen in this region. Lungs elsewhere clear. No change in cardiac silhouette. Electronically Signed   By: Lowella Grip III M.D.   On: 02/19/2015 21:29    Microbiology: Recent Results (from the past 240 hour(s))  Culture, blood (routine x 2)     Status: None (Preliminary result)   Collection Time: 02/19/15  8:15 PM  Result Value Ref Range Status   Specimen Description BLOOD LEFT HAND  Final   Special Requests BOTTLES DRAWN AEROBIC AND ANAEROBIC 5CC  Final   Culture NO GROWTH < 24 HOURS  Final  Report Status PENDING  Incomplete  Culture, blood (routine x 2)     Status: None (Preliminary result)   Collection Time: 02/19/15  8:28 PM  Result Value Ref Range Status   Specimen Description BLOOD RIGHT HAND  Final   Special Requests BOTTLES DRAWN AEROBIC AND ANAEROBIC 3CC  Final   Culture NO GROWTH < 24 HOURS  Final   Report Status PENDING  Incomplete  Urine culture     Status: None   Collection Time: 02/19/15 11:54 PM  Result Value Ref Range Status   Specimen Description URINE, RANDOM  Final   Special Requests NONE  Final   Culture NO GROWTH 1 DAY  Final   Report Status 02/21/2015 FINAL  Final     Labs: Basic Metabolic Panel:  Recent Labs Lab 02/19/15 1945 02/20/15 0312  NA 134* 140  K 4.2 3.9  CL 100* 103  CO2 23 26  GLUCOSE 427* 262*  BUN  28* 31*  CREATININE 1.72* 1.70*  CALCIUM 8.2* 8.0*   Liver Function Tests:  Recent Labs Lab 02/19/15 1945  AST 26  ALT 21  ALKPHOS 113  BILITOT 0.7  PROT 6.5  ALBUMIN 3.3*   No results for input(s): LIPASE, AMYLASE in the last 168 hours. No results for input(s): AMMONIA in the last 168 hours. CBC:  Recent Labs Lab 02/19/15 1945 02/20/15 0312  WBC 11.9* 13.5*  HGB 10.9* 9.7*  HCT 34.8* 30.6*  MCV 98.9 99.0  PLT 178 152   Cardiac Enzymes:  Recent Labs Lab 02/20/15 0018  TROPONINI <0.03   BNP: BNP (last 3 results)  Recent Labs  02/20/15 0018  BNP 592.5*    ProBNP (last 3 results) No results for input(s): PROBNP in the last 8760 hours.  CBG:  Recent Labs Lab 02/20/15 0746 02/20/15 1210 02/20/15 1633 02/20/15 2107 02/21/15 0733  GLUCAP 188* 230* 171* 233* 159*       Signed:  Noal Abshier A  [facility] 02/21/2015, 10:19 AM

## 2015-02-21 NOTE — Progress Notes (Signed)
[facility] PROGRESS NOTE   [name] Powell [id number] DOB: [birth year] DOA: 02/19/2015 PCP: [name], MD  HPI/Subjective: Seen with wife at bedside denies any complaints.  Assessment/Plan: Principal Problem:   Community acquired pneumonia Active Problems:   Pacemaker dependent - Medtronic   CAD s/p CABG 2003   CHB (complete heart block) ([facility])   DM2 (diabetes mellitus, type 2) ([facility])   HTN (hypertension)   Chronic diastolic congestive heart failure ([place])   Atrial flutter ([place])   CAP (community acquired pneumonia)   This is no charge note, patient seen and examined earlier today by my colleague Dr. Loleta Books. Patient seen and examined, data base reviewed. Piercy be acute bronchitis versus early pneumonia, started on Rocephin and azithromycin.  Code Status: Full Code Family Communication: Plan discussed with the patient. Disposition Plan: Remains inpatient Diet: Diet Heart Room service appropriate?: Yes; Fluid consistency:: Thin  Consultants:  None  Procedures:  None  Antibiotics:  Rocephin and a this Romycin (indicate start date, and stop date if known)   Objective: Filed Vitals:   02/21/15 0934  BP: 125/55  Pulse: 65  Temp: 98 F (36.7 C)  Resp: 17    Intake/Output Summary (Last 24 hours) at 02/21/15 1020 Last data filed at 02/21/15 0931  Gross per 24 hour  Intake   3300 ml  Output    950 ml  Net   2350 ml   Filed Weights   02/19/15 2318 02/20/15 2109  Weight: 88.86 kg (195 lb 14.4 oz) 90.1 kg (198 lb 10.2 oz)    Exam: General: Alert and awake, oriented x3, not in any acute distress. HEENT: anicteric sclera, pupils reactive to light and accommodation, EOMI CVS: S1-S2 clear, no murmur rubs or gallops Chest: clear to auscultation bilaterally, no wheezing, rales or rhonchi Abdomen: soft nontender, nondistended, normal bowel sounds, no organomegaly Extremities: no cyanosis, clubbing or edema noted bilaterally Neuro:  Cranial nerves II-XII intact, no focal neurological deficits  Data Reviewed: Basic Metabolic Panel:  Recent Labs Lab 02/19/15 1945 02/20/15 0312  NA 134* 140  K 4.2 3.9  CL 100* 103  CO2 23 26  GLUCOSE 427* 262*  BUN 28* 31*  CREATININE 1.72* 1.70*  CALCIUM 8.2* 8.0*   Liver Function Tests:  Recent Labs Lab 02/19/15 1945  AST 26  ALT 21  ALKPHOS 113  BILITOT 0.7  PROT 6.5  ALBUMIN 3.3*   No results for input(s): LIPASE, AMYLASE in the last 168 hours. No results for input(s): AMMONIA in the last 168 hours. CBC:  Recent Labs Lab 02/19/15 1945 02/20/15 0312  WBC 11.9* 13.5*  HGB 10.9* 9.7*  HCT 34.8* 30.6*  MCV 98.9 99.0  PLT 178 152   Cardiac Enzymes:  Recent Labs Lab 02/20/15 0018  TROPONINI <0.03   BNP (last 3 results)  Recent Labs  02/20/15 0018  BNP 592.5*    ProBNP (last 3 results) No results for input(s): PROBNP in the last 8760 hours.  CBG:  Recent Labs Lab 02/20/15 0746 02/20/15 1210 02/20/15 1633 02/20/15 2107 02/21/15 0733  GLUCAP 188* 230* 171* 233* 159*    Micro Recent Results (from the past 240 hour(s))  Culture, blood (routine x 2)     Status: None (Preliminary result)   Collection Time: 02/19/15  8:15 PM  Result Value Ref Range Status   Specimen Description BLOOD LEFT HAND  Final   Special Requests BOTTLES DRAWN AEROBIC AND ANAEROBIC 5CC  Final   Culture NO GROWTH < 24 HOURS  Final  Report Status PENDING  Incomplete  Culture, blood (routine x 2)     Status: None (Preliminary result)   Collection Time: 02/19/15  8:28 PM  Result Value Ref Range Status   Specimen Description BLOOD RIGHT HAND  Final   Special Requests BOTTLES DRAWN AEROBIC AND ANAEROBIC 3CC  Final   Culture NO GROWTH < 24 HOURS  Final   Report Status PENDING  Incomplete  Urine culture     Status: None   Collection Time: 02/19/15 11:54 PM  Result Value Ref Range Status   Specimen Description URINE, RANDOM  Final   Special Requests NONE  Final    Culture NO GROWTH 1 DAY  Final   Report Status 02/21/2015 FINAL  Final     Studies: Dg Chest 2 View  02/19/2015  CLINICAL DATA:  Productive cough and fever EXAM: CHEST  2 VIEW COMPARISON:  July 02, 2014 FINDINGS: There is chronic left base scarring and pleural thickening. Lungs elsewhere clear. Heart is upper normal in size with pulmonary vascularity within normal limits. Pacemaker leads are attached to the right atrium right ventricle. Patient is status post coronary artery bypass grafting. No adenopathy. There is degenerative change in the thoracic spine. IMPRESSION: Scarring and pleural thickening left base, stable. No new opacity is seen in this region. Lungs elsewhere clear. No change in cardiac silhouette. Electronically Signed   By: Lowella Grip III M.D.   On: 02/19/2015 21:29    Scheduled Meds: . apixaban  2.5 mg Oral BID  . atorvastatin  20 mg Oral Daily  . azithromycin  500 mg Oral QHS  . cefTRIAXone (ROCEPHIN)  IV  1 g Intravenous Q24H  . insulin aspart  0-20 Units Subcutaneous TID WC  . insulin aspart  0-5 Units Subcutaneous QHS  . linagliptin  5 mg Oral Daily  . metoprolol tartrate  100 mg Oral Daily  . metoprolol tartrate  50 mg Oral Q2000  . polyethylene glycol  17 g Oral Daily   Continuous Infusions: . sodium chloride 100 mL/hr at 02/20/15 2140       Time spent: 35 minutes    [facility] A  [facility] Pager [phone] If 7PM-7AM, please contact night-coverage at [email or web address], password [facility] 02/21/2015, 10:20 AM  LOS: 2 days

## 2015-02-24 LAB — CULTURE, BLOOD (ROUTINE X 2)
CULTURE: NO GROWTH
Culture: NO GROWTH

## 2015-02-28 DIAGNOSIS — J189 Pneumonia, unspecified organism: Secondary | ICD-10-CM | POA: Diagnosis not present

## 2015-02-28 DIAGNOSIS — E119 Type 2 diabetes mellitus without complications: Secondary | ICD-10-CM | POA: Diagnosis not present

## 2015-02-28 DIAGNOSIS — N183 Chronic kidney disease, stage 3 (moderate): Secondary | ICD-10-CM | POA: Diagnosis not present

## 2015-04-21 ENCOUNTER — Other Ambulatory Visit: Payer: Self-pay | Admitting: Cardiovascular Disease

## 2015-04-21 MED ORDER — METOPROLOL TARTRATE 50 MG PO TABS: ORAL_TABLET | ORAL | Status: DC

## 2015-04-21 NOTE — Telephone Encounter (Signed)
°*  STAT* If patient is at the pharmacy, call can be transferred to refill team.   1. Which medications need to be refilled? (please list name of each medication and dose if known) Metoprolol  2. Which pharmacy/location (including street and city if local pharmacy) is medication to be sent to?[facility]  3. Do they need a 30 day or 90 day supply? 270 and refills

## 2015-04-21 NOTE — Telephone Encounter (Signed)
Rx send into pt pharmacy

## 2015-04-23 ENCOUNTER — Encounter: Payer: Commercial Managed Care - HMO | Admitting: Internal Medicine

## 2015-04-23 ENCOUNTER — Encounter: Payer: Self-pay | Admitting: Cardiovascular Disease

## 2015-04-23 ENCOUNTER — Ambulatory Visit (INDEPENDENT_AMBULATORY_CARE_PROVIDER_SITE_OTHER): Payer: Commercial Managed Care - HMO | Admitting: *Deleted

## 2015-04-23 DIAGNOSIS — I455 Other specified heart block: Secondary | ICD-10-CM

## 2015-04-23 DIAGNOSIS — I442 Atrioventricular block, complete: Secondary | ICD-10-CM

## 2015-04-23 DIAGNOSIS — I472 Ventricular tachycardia: Secondary | ICD-10-CM

## 2015-04-23 DIAGNOSIS — I4729 Other ventricular tachycardia: Secondary | ICD-10-CM

## 2015-04-23 LAB — CUP PACEART INCLINIC DEVICE CHECK
Battery Voltage: 2.75 V
Brady Statistic AP VP Percent: 83 %
Brady Statistic AP VS Percent: 0 %
Brady Statistic AS VP Percent: 16 %
Implantable Lead Implant Date: 20080804
Implantable Lead Location: 753859
Implantable Lead Location: 753860
Implantable Lead Model: 5076
Implantable Lead Model: 5076
Lead Channel Impedance Value: 471 Ohm
Lead Channel Pacing Threshold Amplitude: 0.75 V
Lead Channel Pacing Threshold Amplitude: 1.25 V
Lead Channel Pacing Threshold Pulse Width: 0.4 ms
Lead Channel Pacing Threshold Pulse Width: 0.4 ms
Lead Channel Setting Pacing Pulse Width: 0.4 ms
MDC IDC LEAD IMPLANT DT: 20080804
MDC IDC MSMT BATTERY IMPEDANCE: 1245 Ohm
MDC IDC MSMT BATTERY REMAINING LONGEVITY: 33 mo
MDC IDC MSMT LEADCHNL RV IMPEDANCE VALUE: 530 Ohm
MDC IDC SESS DTM: 20161214144329
MDC IDC SET LEADCHNL RA PACING AMPLITUDE: 2.25 V
MDC IDC SET LEADCHNL RV PACING AMPLITUDE: 2.5 V
MDC IDC SET LEADCHNL RV SENSING SENSITIVITY: 2 mV
MDC IDC STAT BRADY AS VS PERCENT: 1 %

## 2015-04-23 NOTE — Progress Notes (Signed)
Pacemaker check in clinic. Normal device function. Thresholds, sensing, impedances consistent with previous measurements. Device programmed to maximize longevity. 60.2% mode switched + eliquis. 1 high ventricular rate- 5 seconds, markers only. Device programmed at appropriate safety margins. Histogram distribution appropriate for patient activity level. Device programmed to optimize intrinsic conduction. Estimated longevity 1.5-4 years. ROV with [facility] 08/05/15.

## 2015-05-07 DIAGNOSIS — J209 Acute bronchitis, unspecified: Secondary | ICD-10-CM | POA: Diagnosis not present

## 2015-05-14 DIAGNOSIS — E1165 Type 2 diabetes mellitus with hyperglycemia: Secondary | ICD-10-CM | POA: Diagnosis not present

## 2015-05-14 DIAGNOSIS — E782 Mixed hyperlipidemia: Secondary | ICD-10-CM | POA: Diagnosis not present

## 2015-05-16 DIAGNOSIS — I2510 Atherosclerotic heart disease of native coronary artery without angina pectoris: Secondary | ICD-10-CM | POA: Diagnosis not present

## 2015-05-16 DIAGNOSIS — N183 Chronic kidney disease, stage 3 (moderate): Secondary | ICD-10-CM | POA: Diagnosis not present

## 2015-05-16 DIAGNOSIS — E782 Mixed hyperlipidemia: Secondary | ICD-10-CM | POA: Diagnosis not present

## 2015-05-16 DIAGNOSIS — E559 Vitamin D deficiency, unspecified: Secondary | ICD-10-CM | POA: Diagnosis not present

## 2015-05-16 DIAGNOSIS — E1122 Type 2 diabetes mellitus with diabetic chronic kidney disease: Secondary | ICD-10-CM | POA: Diagnosis not present

## 2015-05-16 DIAGNOSIS — C61 Malignant neoplasm of prostate: Secondary | ICD-10-CM | POA: Diagnosis not present

## 2015-05-16 DIAGNOSIS — E1169 Type 2 diabetes mellitus with other specified complication: Secondary | ICD-10-CM | POA: Diagnosis not present

## 2015-05-16 DIAGNOSIS — E1165 Type 2 diabetes mellitus with hyperglycemia: Secondary | ICD-10-CM | POA: Diagnosis not present

## 2015-05-24 ENCOUNTER — Other Ambulatory Visit: Payer: Self-pay | Admitting: Cardiovascular Disease

## 2015-06-04 DIAGNOSIS — D044 Carcinoma in situ of skin of scalp and neck: Secondary | ICD-10-CM | POA: Diagnosis not present

## 2015-06-04 DIAGNOSIS — L570 Actinic keratosis: Secondary | ICD-10-CM | POA: Diagnosis not present

## 2015-07-12 ENCOUNTER — Other Ambulatory Visit: Payer: Self-pay | Admitting: Cardiovascular Disease

## 2015-07-14 NOTE — Telephone Encounter (Signed)
Rx(s) sent to pharmacy electronically.  

## 2015-07-15 ENCOUNTER — Telehealth: Payer: Self-pay | Admitting: Cardiovascular Disease

## 2015-07-15 MED ORDER — POTASSIUM CHLORIDE CRYS ER 10 MEQ PO TBCR: 10.0000 meq | EXTENDED_RELEASE_TABLET | Freq: Every day | ORAL | Status: DC

## 2015-07-15 MED ORDER — APIXABAN 5 MG PO TABS: ORAL_TABLET | ORAL | Status: DC

## 2015-07-15 NOTE — Telephone Encounter (Signed)
Refill sent to the pharmacy electronically.  

## 2015-07-15 NOTE — Telephone Encounter (Signed)
°*  STAT* If patient is at the pharmacy, call can be transferred to refill team   1. Which medications need to be refilled? (please list name of each medication and dose if known) Eliquis and Klor-Con MeQ10mg   2. Which pharmacy/location (including street and city if local pharmacy) is medication to be sent to? [place] [facility] [address]   3. Do they need a 30 day or 90 day supply? 90 and a partial of Eliquis ([facility])

## 2015-07-24 DIAGNOSIS — R0989 Other specified symptoms and signs involving the circulatory and respiratory systems: Secondary | ICD-10-CM | POA: Diagnosis not present

## 2015-07-24 DIAGNOSIS — R05 Cough: Secondary | ICD-10-CM | POA: Diagnosis not present

## 2015-07-25 ENCOUNTER — Telehealth: Payer: Self-pay | Admitting: *Deleted

## 2015-07-25 NOTE — Telephone Encounter (Signed)
Mrs. Gongwer calling at 4:58pm about her husband's gradual physical decline. She reports that he has been getting gradually weaker for the last couple of weeks and yesterday he had a very difficult time getting out of the chair. She reports that he has also been dragging his feet lately- she denies that he has unilateral weakness and says that he has been taking his eliquis daily. At this point and time I am limited as to what I can do for the patient being that our office is closed and it is a Friday. I have suggested that they go to the ER if she is worried about his condition or she could go to an urgent care. His last pacemaker check was 04/23/15 and he had 1.5-4 years battery longevity at that time. I told Mrs. Hendershot that this does not sound like an issue with the pacemaker but that they can always check pacemaker function if he does report to the ER. She verbalizes understanding and say that she will take him to the ER if his condition worsens.

## 2015-07-28 ENCOUNTER — Telehealth: Payer: Self-pay | Admitting: Cardiovascular Disease

## 2015-07-28 ENCOUNTER — Ambulatory Visit (INDEPENDENT_AMBULATORY_CARE_PROVIDER_SITE_OTHER): Payer: Commercial Managed Care - HMO | Admitting: *Deleted

## 2015-07-28 DIAGNOSIS — I4892 Unspecified atrial flutter: Secondary | ICD-10-CM

## 2015-07-28 DIAGNOSIS — I4729 Other ventricular tachycardia: Secondary | ICD-10-CM

## 2015-07-28 DIAGNOSIS — I442 Atrioventricular block, complete: Secondary | ICD-10-CM | POA: Diagnosis not present

## 2015-07-28 DIAGNOSIS — I472 Ventricular tachycardia: Secondary | ICD-10-CM

## 2015-07-28 LAB — CUP PACEART INCLINIC DEVICE CHECK
Battery Impedance: 1356 Ohm
Battery Voltage: 2.74 V
Brady Statistic AP VS Percent: 0 %
Brady Statistic AS VP Percent: 33 %
Implantable Lead Implant Date: 20080804
Implantable Lead Model: 5076
Implantable Lead Model: 5076
Lead Channel Impedance Value: 478 Ohm
Lead Channel Pacing Threshold Amplitude: 0.75 V
Lead Channel Pacing Threshold Amplitude: 0.875 V
Lead Channel Pacing Threshold Pulse Width: 0.4 ms
Lead Channel Sensing Intrinsic Amplitude: 11.2 mV
Lead Channel Setting Pacing Amplitude: 2.5 V
Lead Channel Setting Sensing Sensitivity: 4 mV
MDC IDC LEAD IMPLANT DT: 20080804
MDC IDC LEAD LOCATION: 753859
MDC IDC LEAD LOCATION: 753860
MDC IDC MSMT BATTERY REMAINING LONGEVITY: 31 mo
MDC IDC MSMT LEADCHNL RA PACING THRESHOLD AMPLITUDE: 0.75 V
MDC IDC MSMT LEADCHNL RA PACING THRESHOLD PULSEWIDTH: 0.4 ms
MDC IDC MSMT LEADCHNL RA SENSING INTR AMPL: 0.7 mV
MDC IDC MSMT LEADCHNL RV IMPEDANCE VALUE: 528 Ohm
MDC IDC MSMT LEADCHNL RV PACING THRESHOLD AMPLITUDE: 0.625 V
MDC IDC MSMT LEADCHNL RV PACING THRESHOLD PULSEWIDTH: 0.4 ms
MDC IDC MSMT LEADCHNL RV PACING THRESHOLD PULSEWIDTH: 0.4 ms
MDC IDC SESS DTM: 20170320161013
MDC IDC SET LEADCHNL RA PACING AMPLITUDE: 1.875
MDC IDC SET LEADCHNL RV PACING PULSEWIDTH: 0.4 ms
MDC IDC STAT BRADY AP VP PERCENT: 64 %
MDC IDC STAT BRADY AS VS PERCENT: 3 %

## 2015-07-28 NOTE — Telephone Encounter (Signed)
Spoke to wife She states patient is congested and weak  Feeling since last Aug 19, 2022   patient did go to urgent care - to be evaluated - per wife , patient had a chest xray. Patient received something for cough ,but no antibiotics   Wife states patient had a hard time walking last August 19, 2022 - but that has passed . Wife wanted to know if patient need an antibiotic from Dr Sallyanne Kuster or pacemaker check.  RN informed wife , the symptoms she is has given ,should be followed by primary  And not Dr Sallyanne Kuster. Will contact device clinic to see if download is possible if she likes, but patient has an appointment on 08-19-2015. RN informed wife if patient gets the symptoms again ( unable to walk or comprehend ) call 911 - to be transported to ER.  wife verbalized understanding.

## 2015-07-28 NOTE — Telephone Encounter (Signed)
Nwe Message  Pt wife calling to speak w/ RN- stated that pt, this weekend, c/o of congestion and weakness- pt was seen @ urgent care and pt wife wanted to know if they needed antibiotic. Please call back and discuss.

## 2015-07-28 NOTE — Telephone Encounter (Signed)
Spoke to device clinic - concerning patient  Will contact wife - to see if download is possible

## 2015-07-28 NOTE — Telephone Encounter (Signed)
Spoke to wife regarding patient sx's. I explained to her that based on his sx's it sounds like he needs to f/u with his PCP or urgent care. Wife stated that she has a neighbor who had similar sx's and needed to have his ppm adjusted, so she wanted it checked. I attempted to get wife to send in a remote transmission, but wife was unable due to missing equipment.   Appt was made for today @ 1530 with the device clinic. I also encouraged wife to contact pt's PCP regarding sx's. Wife voiced understanding.

## 2015-07-28 NOTE — Progress Notes (Signed)
Pacemaker check in clinic due to patient congestion per wife. Normal device function. Thresholds, sensing, impedances consistent with previous measurements. Device programmed to maximize longevity. 60.4% AF burden--max dur. >96hours on 1/28, Max A >400 + Eliquis. 1 high ventricular rate episode noted x 17 bts---NSVT. Device programmed at appropriate safety margins. Histogram distribution appropriate for patient activity level. RV sensitivity decreased from 2.41mV to 4.8mV due to ?RV noise per max V rates. EGMs turned on this ov. Device programmed to optimize intrinsic conduction. Estimated longevity 2.5 years. Patient will follow up with [facility] as scheduled.

## 2015-08-04 ENCOUNTER — Telehealth: Payer: Self-pay

## 2015-08-04 NOTE — Telephone Encounter (Signed)
Patient walked in and filled out yellow sheet. Patient brought in handicap placard application. Dr C signed form.  Called patient, spoke with wife. Patient has an appointment tomorrow, will pick up at office visit.

## 2015-08-05 ENCOUNTER — Ambulatory Visit (INDEPENDENT_AMBULATORY_CARE_PROVIDER_SITE_OTHER): Payer: Commercial Managed Care - HMO | Admitting: Cardiovascular Disease

## 2015-08-05 ENCOUNTER — Encounter: Payer: Self-pay | Admitting: Cardiovascular Disease

## 2015-08-05 VITALS — BP 140/64 | HR 59 | Ht 69.0 in | Wt 182.8 lb

## 2015-08-05 DIAGNOSIS — Z5181 Encounter for therapeutic drug level monitoring: Secondary | ICD-10-CM

## 2015-08-05 DIAGNOSIS — I442 Atrioventricular block, complete: Secondary | ICD-10-CM | POA: Diagnosis not present

## 2015-08-05 DIAGNOSIS — R296 Repeated falls: Secondary | ICD-10-CM | POA: Diagnosis not present

## 2015-08-05 DIAGNOSIS — I472 Ventricular tachycardia: Secondary | ICD-10-CM

## 2015-08-05 DIAGNOSIS — Z950 Presence of cardiac pacemaker: Secondary | ICD-10-CM

## 2015-08-05 DIAGNOSIS — I4892 Unspecified atrial flutter: Secondary | ICD-10-CM

## 2015-08-05 DIAGNOSIS — I5032 Chronic diastolic (congestive) heart failure: Secondary | ICD-10-CM

## 2015-08-05 DIAGNOSIS — R32 Unspecified urinary incontinence: Secondary | ICD-10-CM | POA: Diagnosis not present

## 2015-08-05 DIAGNOSIS — R413 Other amnesia: Secondary | ICD-10-CM | POA: Diagnosis not present

## 2015-08-05 DIAGNOSIS — R251 Tremor, unspecified: Secondary | ICD-10-CM | POA: Diagnosis not present

## 2015-08-05 DIAGNOSIS — I25118 Atherosclerotic heart disease of native coronary artery with other forms of angina pectoris: Secondary | ICD-10-CM

## 2015-08-05 DIAGNOSIS — E785 Hyperlipidemia, unspecified: Secondary | ICD-10-CM

## 2015-08-05 DIAGNOSIS — I4729 Other ventricular tachycardia: Secondary | ICD-10-CM

## 2015-08-05 DIAGNOSIS — R2681 Unsteadiness on feet: Secondary | ICD-10-CM | POA: Diagnosis not present

## 2015-08-05 DIAGNOSIS — Z789 Other specified health status: Secondary | ICD-10-CM | POA: Diagnosis not present

## 2015-08-05 LAB — CUP PACEART INCLINIC DEVICE CHECK
Battery Remaining Longevity: 30 mo
Battery Voltage: 2.74 V
Brady Statistic AP VP Percent: 85.4 %
Date Time Interrogation Session: 20170328101601
Implantable Lead Location: 753859
Lead Channel Pacing Threshold Pulse Width: 0.4 ms
Lead Channel Setting Pacing Amplitude: 1.875
Lead Channel Setting Pacing Pulse Width: 0.4 ms
Lead Channel Setting Sensing Sensitivity: 4 mV
MDC IDC LEAD IMPLANT DT: 20080804
MDC IDC LEAD IMPLANT DT: 20080804
MDC IDC LEAD LOCATION: 753860
MDC IDC MSMT LEADCHNL RA IMPEDANCE VALUE: 464 Ohm
MDC IDC MSMT LEADCHNL RA PACING THRESHOLD AMPLITUDE: 0.875 V
MDC IDC MSMT LEADCHNL RA PACING THRESHOLD PULSEWIDTH: 0.4 ms
MDC IDC MSMT LEADCHNL RV IMPEDANCE VALUE: 535 Ohm
MDC IDC MSMT LEADCHNL RV PACING THRESHOLD AMPLITUDE: 0.625 V
MDC IDC SET LEADCHNL RV PACING AMPLITUDE: 2.5 V
MDC IDC STAT BRADY AP VS PERCENT: 0.4 %
MDC IDC STAT BRADY AS VP PERCENT: 12.7 %
MDC IDC STAT BRADY AS VS PERCENT: 1.5 %

## 2015-08-05 MED ORDER — FUROSEMIDE 20 MG PO TABS: 20.0000 mg | ORAL_TABLET | Freq: Every day | ORAL | Status: DC

## 2015-08-05 MED ORDER — POTASSIUM CHLORIDE CRYS ER 10 MEQ PO TBCR
5.0000 meq | EXTENDED_RELEASE_TABLET | Freq: Every day | ORAL | Status: DC
Start: 2015-08-05 — End: 2015-12-30

## 2015-08-05 NOTE — Patient Instructions (Signed)
Medication Instructions: Dr Sallyanne Kuster has recommended making the following medication changes: 1. DECREASE Furosemide to 20 mg 2. DECREASE Potassium to 5 mEq  >>Updated prescriptions have been sent to your pharmacy electronically  Labwork: Your physician recommends that you return for lab work TODAY.  Testing/Procedures: Remote monitoring is used to monitor your Pacemaker of ICD from home. This monitoring reduces the number of office visits required to check your device to one time per year. It allows [place] to keep an eye on the functioning of your device to ensure it is working properly. You are scheduled for a device check from home on November 04, 2015. You may send your transmission at any time that day. If you have a wireless device, the transmission will be sent automatically. After your physician reviews your transmission, you will receive a postcard with your next transmission date.  Follow-up: Dr Sallyanne Kuster recommends that you schedule a follow-up appointment in 6 months. You will receive a reminder letter in the mail two months in advance. If you don't receive a letter, please call our office to schedule the follow-up appointment.  If you need a refill on your cardiac medications before your next appointment, please call your pharmacy.

## 2015-08-06 LAB — BASIC METABOLIC PANEL
BUN: 33 mg/dL — ABNORMAL HIGH (ref 7–25)
CALCIUM: 8.8 mg/dL (ref 8.6–10.3)
CHLORIDE: 104 mmol/L (ref 98–110)
CO2: 27 mmol/L (ref 20–31)
Creat: 1.61 mg/dL — ABNORMAL HIGH (ref 0.70–1.11)
GLUCOSE: 186 mg/dL — AB (ref 65–99)
POTASSIUM: 5 mmol/L (ref 3.5–5.3)
SODIUM: 140 mmol/L (ref 135–146)

## 2015-08-06 LAB — BRAIN NATRIURETIC PEPTIDE: Brain Natriuretic Peptide: 266.3 pg/mL — ABNORMAL HIGH (ref <100)

## 2015-08-06 NOTE — Progress Notes (Signed)
Patient ID: [name] Powell, male   DOB: 1922/07/13, 82 y.o.   MRN: [id number]    Cardiology Office Note    Date:  08/06/2015   ID:  [name] Powell, [name] Powell 04-23-1923, MRN [id number]  PCP:  [name], MD  Cardiologist:   [name], MD   Chief Complaint  Patient presents with  . Pacemaker Check    pt states no chest pain no light headedness or dizziness  . Edema    a little in both ankles   . Follow-up    pt son states that he has a cough thats not going away and nothing is coming up and also thinks he may have suffered a TIA on March 16, took him to the Dr where they did a nose swab, he thinks he suffered one because he was somewhat confused   . Follow-up    History of Present Illness:  [name] Powell is a 82 y.o. male with CAD, sinus arrest and complete heart block (pacemaker dependent), persistent atrial flutter since January 2016.  He has had some weakness since starting treatment with furosemide. He has lost 16 pounds since initiation of the diuretic. On March 16 he had trouble getting out of his car and his family was concerned that he might have had a transient ischemic attack. However, her description suggests that he had weakness in his legs bilaterally and not really any focal neurological complaints. It is possible he was experiencing hypotension. In addition he has had a cough productive of tenacious phlegm and has recently completed treatment with antibiotics. He is afebrile and does not have any problems with dyspnea. He has no edema in his legs whatsoever. He has not had any bleeding problems or falls.  Interrogation of his dual-chamber permanent pacemaker (Medtronic Adapta implanted 2008, estimated generator longevity 2.5 years)shows atrial paced ventricular paced rhythm with occasional PVCs. Unfortunately there is clearly some P-wave undersensing and we reduced the atrial sensitivity to 0.8 mV. He has 86% atrial pacing and 98.5% ventricular pacing. His  previous atrial flutter has abated over the last 6 months he has had 37% mode switch. He has occasional episodes of high ventricular rate consistent with nonsustained ventricular tachycardia.  14 years have passed since his CABG surgery. His last nuclear study in August 2015 was low risk (small apical scar, no ischemia, EF 44%). Echo August 2015 confirmed apical akinesis, but EF was estimated at 55%.He has complete heart block and sinus node arrest and is pacemaker dependent (>90% A paced, 100% V paced).  His dual-chamber device (Medtronic) was implanted in August 2008. He had 20-beat nonsustained VT recorded in September 2015. 30 minute episode of paroxysmal atrial tach in November 2015. In January 2016 he developed persistent atrial flutter and this appeared to trigger worsened CHF. Attempted overdrive pacing of the arrhythmia failed. He is very sensitive to programming changes. He feels better at lower rate limit programmed at 60 rather than 70 bpm. He has treated coronary risk factors (hypertension, hyperlipidemia, diabetes mellitus type 2 on oral antidiabetics).   Past Medical History  Diagnosis Date  . Diabetes mellitus   . Hypertension   . Pacemaker 12/12/2006    Medtronic adapta  . Coronary artery disease   . 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 CHB (complete heart block) ([place])   . Dyslipidemia   . H/O prostate cancer   . Ventricular tachycardia (paroxysmal) ([place]) 03/28/2014  .  New onset atrial flutter, persistent 07/04/2014  . Atrial fibrillation ([place])   . Cancer ([place])   . Prostate cancer [facility])     Past Surgical History  Procedure Laterality Date  . Pacemaker insertion  12/12/2006    Medtronic adapta  . Coronary artery bypass graft  09/04/2001    LIMA to LAD,SVG to diagonal,SVG to ramus intermedius,SVG to PDA  . Prostate surgery  2001    cancer  . Nm myoview ltd  01/09/2010    no ischemia    Current  Medications: Outpatient Prescriptions Prior to Visit  Medication Sig Dispense Refill  . apixaban (ELIQUIS) 5 MG TABS tablet TAKE 1 TABLET (5 MG TOTAL) BY MOUTH 2 (TWO) TIMES DAILY. 30 tablet 1  . atorvastatin (LIPITOR) 20 MG tablet Take 20 mg by mouth daily.    . Cholecalciferol (VITAMIN D PO) Take 10,000 Units by mouth every 7 (seven) days. On Friday    . glimepiride (AMARYL) 4 MG tablet Take 4 mg by mouth daily before breakfast.    . metFORMIN (GLUCOPHAGE-XR) 500 MG 24 hr tablet Take 500 mg by mouth daily.    . metoprolol (LOPRESSOR) 50 MG tablet Take 2 tablets (100mg ) by mouth in the morning and 1 tablet (50mg ) in the evening. 270 tablet 2  . mometasone (ELOCON) 0.1 % cream APPLY TO LEGS AS NEEDED FOR FOOT DERMATITIS  0  . Multiple Vitamins-Minerals (PRESERVISION AREDS) CAPS Take 1 capsule by mouth 2 (two) times daily.    . polyethylene glycol (MIRALAX / GLYCOLAX) packet Take 17 g by mouth daily. To prevent constipation    . valsartan (DIOVAN) 160 MG tablet TAKE 1 TABLET EVERY DAY 90 tablet 1  . amoxicillin-clavulanate (AUGMENTIN) 875-125 MG tablet Take 1 tablet by mouth 2 (two) times daily. (Patient not taking: Reported on 04/23/2015) 10 tablet 0  . furosemide (LASIX) 40 MG tablet Take 1 tablet (40 mg total) by mouth daily. 90 tablet 3  . guaiFENesin (MUCINEX) 600 MG 12 hr tablet Take 2 tablets (1,200 mg total) by mouth 2 (two) times daily. (Patient not taking: Reported on 04/23/2015) 30 tablet 0  . potassium chloride (K-DUR) 10 MEQ tablet Take 1 tablet (10 mEq total) by mouth daily. 90 tablet 3  . potassium chloride (K-DUR,KLOR-CON) 10 MEQ tablet Take 1 tablet (10 mEq total) by mouth daily. 90 tablet 1  . sitaGLIPtin (JANUVIA) 100 MG tablet Take 50 mg by mouth daily.      No facility-administered medications prior to visit.     Allergies:   Review of patient's allergies indicates no known allergies.   Social History   Social History  . Marital Status: Married    Spouse Name: N/A  .  Number of Children: N/A  . Years of Education: N/A   Social History Main Topics  . Smoking status: Former Smoker    Quit date: 05/14/1962  . Smokeless tobacco: None  . Alcohol Use: Yes     Comment: seldom  . Drug Use: No  . Sexual Activity: Not Asked   Other Topics Concern  . None   Social History Narrative     Family History:  The patient's family history includes Heart disease in his mother.   ROS:   Please see the history of present illness.    ROS All other systems reviewed and are negative.   PHYSICAL EXAM:   VS:  BP 140/64 mmHg  Pulse 59  Ht 5\' 9"  (1.753 m)  Wt 82.918 kg (182 lb 12.8 oz)  BMI 26.98 kg/m2   GEN: Well nourished, well developed, in no acute distress HEENT: normal Neck: no JVD, carotid bruits, or masses Cardiac: RRR; no murmurs, rubs, or gallops,no edema  Respiratory:  clear to auscultation bilaterally, normal work of breathing GI: soft, nontender, nondistended, + BS MS: no deformity or atrophy Skin: warm and dry, no rash Neuro:  Alert and Oriented x 3, Strength and sensation are intact. There is some hesitancy initiation of movements, responds to questions and some dulling of facial expressions, all suggestive of some degree of parkinsonism Psych: euthymic mood, full affect  Wt Readings from Last 3 Encounters:  08/05/15 82.918 kg (182 lb 12.8 oz)  02/20/15 90.1 kg (198 lb 10.2 oz)  01/21/15 88.86 kg (195 lb 14.4 oz)      Studies/Labs Reviewed:   EKG:  EKG is ordered today.  The ekg ordered today demonstrates Atrial ventricular sequential pacing, one PAC  Recent Labs: 02/19/2015: ALT 21 02/20/2015: B Natriuretic Peptide 592.5*; Hemoglobin 9.7*; Platelets 152 08/05/2015: BUN 33*; Creat 1.61*; Potassium 5.0; Sodium 140      ASSESSMENT:    1. CHB (complete heart block) ([facility])   2. Pacemaker dependent - Medtronic   3. Ventricular tachycardia (paroxysmal) ([place])   4. Atrial flutter, unspecified type ([place])   5. Coronary artery disease  involving native coronary artery of native heart with other form of angina pectoris ([place])   6. Chronic diastolic congestive heart failure ([place])   7. Dyslipidemia   8. Therapeutic drug monitoring      PLAN:  In order of problems listed above:  1. CHB:  Pacemaker dependent 2. PPM: Normal pacemaker function with the exception of atrial under sensing. At this point this is not causing any serious interference with device function. It may lead to absence of mode switch during atrial flutter. He has complete heart block. Hopefully this will not cause really any difficulty with rhythm management. He is very sensitive to programming changes. He feels better at lower rate limit programmed at 60 rather than 70 bpm. 3. NSVT: Episodes of ventricular tachycardia remain brief, infrequent and asymptomatic. He is on beta blocker therapy, which should not be increased since he has problems with orthostatic hypotension. 4. AFlutter: Low the exact burden may be underestimated due to atrial under sensing, there has been a striking reduction in the prevalence of atrial flutter compared with his last office visit. This has been accompanied by a reduction in the symptoms of congestive heart failure. He should continue anticoagulation as long as this is safe. He has not had bleeding complications. CHADSVasc at least 5. 5. CAD s/p CABG: Asymptomatic. Relatively recent low risk nuclear stress test without ischemia. On statin, beta blocker, not taking aspirin due to concomitant anticoagulation 6. CHF: Most recent echocardiogram shows left ventricular ejection fraction 55% with RV pacing-induced dyssynchrony. Sedentary status precludes assessment of functional status, but clinically appears euvolemic or may be slightly hypovolemic. Will decrease the dose of furosemide and the dose of potassium supplementation half. I suspect that we are "under his dry weight". 7. HLP on statin   Medication Adjustments/Labs and Tests  Ordered: Current medicines are reviewed at length with the patient today.  Concerns regarding medicines are outlined above.  Medication changes, Labs and Tests ordered today are listed in the Patient Instructions below. Patient Instructions  Medication Instructions: Dr Sallyanne Kuster has recommended making the following medication changes: 1. DECREASE Furosemide to 20 mg 2. DECREASE Potassium to 5 mEq  >>Updated prescriptions have been sent to  your pharmacy electronically  Labwork: Your physician recommends that you return for lab work TODAY.  Testing/Procedures: Remote monitoring is used to monitor your Pacemaker of ICD from home. This monitoring reduces the number of office visits required to check your device to one time per year. It allows [place] to keep an eye on the functioning of your device to ensure it is working properly. You are scheduled for a device check from home on November 04, 2015. You may send your transmission at any time that day. If you have a wireless device, the transmission will be sent automatically. After your physician reviews your transmission, you will receive a postcard with your next transmission date.  Follow-up: Dr Sallyanne Kuster recommends that you schedule a follow-up appointment in 6 months. You will receive a reminder letter in the mail two months in advance. If you don't receive a letter, please call our office to schedule the follow-up appointment.  If you need a refill on your cardiac medications before your next appointment, please call your pharmacy.    Mikael Spray, MD  08/06/2015 2:10 PM    [facility] [address], [place], [place]  [address] Phone: [phone]; Fax: [phone]

## 2015-08-11 ENCOUNTER — Encounter: Payer: Self-pay | Admitting: Cardiovascular Disease

## 2015-08-11 DIAGNOSIS — R2681 Unsteadiness on feet: Secondary | ICD-10-CM | POA: Diagnosis not present

## 2015-08-11 DIAGNOSIS — R32 Unspecified urinary incontinence: Secondary | ICD-10-CM | POA: Diagnosis not present

## 2015-08-11 DIAGNOSIS — E119 Type 2 diabetes mellitus without complications: Secondary | ICD-10-CM | POA: Diagnosis not present

## 2015-08-11 DIAGNOSIS — J189 Pneumonia, unspecified organism: Secondary | ICD-10-CM | POA: Diagnosis not present

## 2015-08-11 DIAGNOSIS — R296 Repeated falls: Secondary | ICD-10-CM | POA: Diagnosis not present

## 2015-08-11 DIAGNOSIS — N183 Chronic kidney disease, stage 3 (moderate): Secondary | ICD-10-CM | POA: Diagnosis not present

## 2015-08-12 ENCOUNTER — Encounter: Payer: Self-pay | Admitting: *Deleted

## 2015-08-12 DIAGNOSIS — R32 Unspecified urinary incontinence: Secondary | ICD-10-CM | POA: Diagnosis not present

## 2015-08-12 DIAGNOSIS — N183 Chronic kidney disease, stage 3 (moderate): Secondary | ICD-10-CM | POA: Diagnosis not present

## 2015-08-12 DIAGNOSIS — E119 Type 2 diabetes mellitus without complications: Secondary | ICD-10-CM | POA: Diagnosis not present

## 2015-08-12 DIAGNOSIS — R296 Repeated falls: Secondary | ICD-10-CM | POA: Diagnosis not present

## 2015-08-12 DIAGNOSIS — J189 Pneumonia, unspecified organism: Secondary | ICD-10-CM | POA: Diagnosis not present

## 2015-08-12 DIAGNOSIS — R2681 Unsteadiness on feet: Secondary | ICD-10-CM | POA: Diagnosis not present

## 2015-08-12 NOTE — Addendum Note (Signed)
Addended by: Therisa Doyne on: 08/12/2015 05:20 PM   Modules accepted: Orders

## 2015-08-13 ENCOUNTER — Encounter: Payer: Self-pay | Admitting: Diagnostic Neuroimaging

## 2015-08-13 ENCOUNTER — Ambulatory Visit (INDEPENDENT_AMBULATORY_CARE_PROVIDER_SITE_OTHER): Payer: Commercial Managed Care - HMO | Admitting: Diagnostic Neuroimaging

## 2015-08-13 VITALS — BP 127/63 | HR 60 | Ht 68.0 in | Wt 193.8 lb

## 2015-08-13 DIAGNOSIS — E1142 Type 2 diabetes mellitus with diabetic polyneuropathy: Secondary | ICD-10-CM

## 2015-08-13 DIAGNOSIS — G20 Parkinson's disease: Secondary | ICD-10-CM | POA: Diagnosis not present

## 2015-08-13 DIAGNOSIS — R269 Unspecified abnormalities of gait and mobility: Secondary | ICD-10-CM | POA: Diagnosis not present

## 2015-08-13 DIAGNOSIS — R490 Dysphonia: Secondary | ICD-10-CM

## 2015-08-13 DIAGNOSIS — G4752 REM sleep behavior disorder: Secondary | ICD-10-CM | POA: Diagnosis not present

## 2015-08-13 MED ORDER — CARBIDOPA-LEVODOPA 25-100 MG PO TABS: 1.0000 | ORAL_TABLET | Freq: Three times a day (TID) | ORAL | Status: DC

## 2015-08-13 NOTE — Progress Notes (Signed)
[facility]  PATIENT: [name] Powell DOB: 03/14/1923  REFERRING CLINICIAN: Edwin Dada, MD HISTORY FROM: patient, wife, son REASON FOR VISIT: new consult     HISTORICAL  CHIEF COMPLAINT:  Chief Complaint  Patient presents with  . Possible hx TIA's    rm 6, New Pt, wife- Inez Catalina, sonJenny Reichmann  . Memory Loss    MMSE 25  . Gait Problem    HISTORY OF PRESENT ILLNESS:   82 year old right-handed male here for evaluation of gait instability, memory loss, frequent falling.   For past 2 years patient has had increasing problems with balance, walking, coordination and increasing falls. Over the past 3 weeks patient has had generalized weakness, walking on toes, increasing shuffling gait. Patient also having problems with short-term memory, confusion, difficulty with tasks such as crossword puzzles which he used to be able to do easily. Patient is a retired Art gallery manager and was very high functioning from intellectual standpoint. Patient has had significant decline from his previous level of functioning. Patient has developed quiet hoarse voice, word finding difficulties, decreased facial expressions. In general he has slowed down quite a bit.   Patient also has constipation and urinary incontinence. He has tendency to punch and kick in his sleep and act out his dreams although this is been going on for more than 25 years according to patient's wife. No change in sense of smell or taste.   REVIEW OF SYSTEMS: Full 14 system review of systems performed and negative with exception of: Only as per history of present illness.  ALLERGIES: Allergies  Allergen Reactions  . Mexitil [Mexiletine]     unknown  . Lanoxin [Digoxin] Rash    Eyelid rash    HOME MEDICATIONS: Outpatient Prescriptions Prior to Visit  Medication Sig Dispense Refill  . apixaban (ELIQUIS) 5 MG TABS tablet TAKE 1 TABLET (5 MG TOTAL) BY MOUTH 2 (TWO) TIMES DAILY. 30 tablet 1  . atorvastatin (LIPITOR)  20 MG tablet Take 20 mg by mouth daily.    . benzonatate (TESSALON) 100 MG capsule Take 1 capsule by mouth 3 (three) times daily. For 10 days.    . Cholecalciferol (VITAMIN D PO) Take 10,000 Units by mouth every 7 (seven) days. On Friday    . furosemide (LASIX) 20 MG tablet Take 1 tablet (20 mg total) by mouth daily. 90 tablet 3  . glimepiride (AMARYL) 4 MG tablet Take 4 mg by mouth daily before breakfast.    . JANUVIA 50 MG tablet Take 1 tablet by mouth daily.    . metFORMIN (GLUCOPHAGE-XR) 500 MG 24 hr tablet Take 500 mg by mouth daily.    . metoprolol (LOPRESSOR) 50 MG tablet Take 2 tablets (100mg ) by mouth in the morning and 1 tablet (50mg ) in the evening. 270 tablet 2  . mometasone (ELOCON) 0.1 % cream APPLY TO LEGS AS NEEDED FOR FOOT DERMATITIS  0  . Multiple Vitamins-Minerals (PRESERVISION AREDS) CAPS Take 1 capsule by mouth 2 (two) times daily.    . polyethylene glycol (MIRALAX / GLYCOLAX) packet Take 17 g by mouth daily. To prevent constipation    . potassium chloride (K-DUR,KLOR-CON) 10 MEQ tablet Take 0.5 tablets (5 mEq total) by mouth daily. 45 tablet 3  . valsartan (DIOVAN) 160 MG tablet TAKE 1 TABLET EVERY DAY 90 tablet 1   No facility-administered medications prior to visit.    PAST MEDICAL HISTORY: Past Medical History  Diagnosis Date  . Diabetes mellitus   . Hypertension   .  Pacemaker 12/12/2006    [employer] adapta  . Coronary artery disease   . 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 CHB (complete heart block) ([place])   . Dyslipidemia   . H/O prostate cancer   . Ventricular tachycardia (paroxysmal) ([place]) 03/28/2014  . New onset atrial flutter, persistent 07/04/2014  . Atrial fibrillation ([place])   . Cancer ([place])   . Prostate cancer [facility])     PAST SURGICAL HISTORY: Past Surgical History  Procedure Laterality Date  . Pacemaker insertion  12/12/2006    Medtronic adapta  . Coronary artery bypass graft  09/04/2001     LIMA to LAD,SVG to diagonal,SVG to ramus intermedius,SVG to PDA  . Prostate surgery  2001    cancer  . Nm myoview ltd  01/09/2010    no ischemia    FAMILY HISTORY: Family History  Problem Relation Age of Onset  . Heart disease Mother     SOCIAL HISTORY:  Social History   Social History  . Marital Status: Married    Spouse Name: Inez Catalina  . Number of Children: 2  . Years of Education: 16   Occupational History  .      retired Art gallery manager   Social History Main Topics  . Smoking status: Former Smoker    Quit date: 05/14/1962  . Smokeless tobacco: Not on file  . Alcohol Use: Yes     Comment: seldom  . Drug Use: No  . Sexual Activity: Not on file   Other Topics Concern  . Not on file   Social History Narrative   Lives at home with wife   Caffeine -seldom     PHYSICAL EXAM  GENERAL EXAM/CONSTITUTIONAL: Vitals:  Filed Vitals:   08/13/15 0849  BP: 127/63  Pulse: 60  Height: 5\' 8"  (1.727 m)  Weight: 193 lb 12.8 oz (87.907 kg)     Body mass index is 29.47 kg/(m^2).  Vision Screening Comments: 08/13/15 unable, wears bifocals  Patient is in no distress; well developed, nourished and groomed; neck is supple  CARDIOVASCULAR:  Examination of carotid arteries is normal; no carotid bruits  Regular rate and rhythm, no murmurs  Examination of peripheral vascular system by observation and palpation is normal  EYES:  Ophthalmoscopic exam of optic discs and posterior segments is normal; no papilledema or hemorrhages  MUSCULOSKELETAL:  Gait, strength, tone, movements noted in Neurologic exam below  NEUROLOGIC: MENTAL STATUS:  MMSE - Mini Mental State Exam 08/13/2015  Orientation to time 5  Orientation to Place 5  Registration 3  Attention/ Calculation 2  Recall 2  Language- name 2 objects 2  Language- repeat 1  Language- follow 3 step command 3  Language- read & follow direction 1  Write a sentence 1  Copy design 1  Total score 26    awake,  alert, oriented to person, place and time  recent and remote memory intact  normal attention and concentration  language fluent, comprehension intact, naming intact,   fund of knowledge appropriate  CRANIAL NERVE:   2nd - no papilledema on fundoscopic exam  2nd, 3rd, 4th, 6th - pupils equal and reactive to light, visual fields full to confrontation, extraocular muscles intact, no nystagmus  5th - facial sensation symmetric  7th - facial strength symmetric  8th - hearing intact  9th - palate elevates symmetrically, uvula midline  11th - shoulder shrug symmetric  12th - tongue protrusion midline  HOARSE  VOICE  MASKED FACIES  MOTOR:   normal bulk  COGWHEELING RIGIDITY   BRADYKINESIA  full strength in the BUE, BLE  SENSORY:   normal and symmetric to light touch, temperature  DECR VIB AT TOES AND ANKLES  COORDINATION:   finger-nose-finger, fine finger movements normal  REFLEXES:   deep tendon reflexes TRACE IN BUE; ABSENT IN BLE  POSITIVE MYERSONS  NEGATIVE SNOUT  GAIT/STATION:   narrow based gait; SLOW TO RISE; STOOPED POSTURE; DECR ARM SWING; SHORT STEPS; EN BLOC TURNING    DIAGNOSTIC DATA (LABS, IMAGING, TESTING) - I reviewed patient records, labs, notes, testing and imaging myself where available.  Lab Results  Component Value Date   WBC 13.5* 02/20/2015   HGB 9.7* 02/20/2015   HCT 30.6* 02/20/2015   MCV 99.0 02/20/2015   PLT 152 02/20/2015      Component Value Date/Time   NA 140 08/05/2015 1013   K 5.0 08/05/2015 1013   CL 104 08/05/2015 1013   CO2 27 08/05/2015 1013   GLUCOSE 186* 08/05/2015 1013   BUN 33* 08/05/2015 1013   CREATININE 1.61* 08/05/2015 1013   CREATININE 1.70* 02/20/2015 0312   CALCIUM 8.8 08/05/2015 1013   PROT 6.5 02/19/2015 1945   ALBUMIN 3.3* 02/19/2015 1945   AST 26 02/19/2015 1945   ALT 21 02/19/2015 1945   ALKPHOS 113 02/19/2015 1945   BILITOT 0.7 02/19/2015 1945   GFRNONAA 34* 02/20/2015 0312    GFRAA 39* 02/20/2015 0312   No results found for: CHOL, HDL, LDLCALC, LDLDIRECT, TRIG, CHOLHDL No results found for: HGBA1C No results found for: VITAMINB12 No results found for: TSH  10/13/11 CT head [I reviewed images myself and agree with interpretation. -VRP]  1. No acute intracranial or calvarial findings. 2. Left frontal scalp soft tissue swelling.  12/11/13 TTE - Normal LV size with moderate LV hypertrophy. EF 55% with septal-lateral dyssynchrony probably from pacing and apicalhypokinesis. Mildly dilated RV wtih mildly decreased systolicfunction.     ASSESSMENT AND PLAN  82 y.o. year old male here with gradual onset progressive balance and gait difficulty, short steps, shuffling gait, masked facies, hoarse voice, word finding difficulties, short-term memory loss, decreased cognitive ability and generalized weakness. Signs and symptoms are suspicious for neurodegenerative process such as Parkinson's disease. Patient also has signs of diabetic neuropathy may be contributed factors well. We'll check additional workup, set up physical therapy, encourage use of walker, and try patient on carbidopa/levodopa.   Ddx: parkinsonism (idiopathic vs vascular) + diabetic neuropathy  1. Parkinson's disease ([place])   2. Gait difficulty   3. Hoarse voice quality   4. RBD (REM behavioral disorder)   5. Diabetic polyneuropathy associated with type 2 diabetes mellitus ([place])      PLAN: - I will check CT head - use rollator walker - continue physical therapy - trial of carbidopa-levodopa (half tab three times per day; after 2 weeks increase to 1 tab three times per day)  Orders Placed This Encounter  Procedures  . For home use only DME 4 wheeled rolling walker with seat  . CT Head Wo Contrast   Meds ordered this encounter  Medications  . carbidopa-levodopa (SINEMET IR) 25-100 MG tablet    Sig: Take 1 tablet by mouth 3 (three) times daily before meals.    Dispense:  90 tablet    Refill:   6   Return in about 6 weeks (around 09/24/2015).  I reviewed images, labs, notes, records myself. I summarized findings and reviewed with patient,  for this moderate risk condition (gati diff; falls; parkinsonism) requiring high complexity decision making.    [name], MD [id number], [id number] AM Certified in Neurology, Neurophysiology and Neuroimaging  [facility] [address] [place], [place] [address] [phone]

## 2015-08-13 NOTE — Patient Instructions (Addendum)
Thank you for coming to see [place] at [facility]. I hope we have been able to provide you high quality care today.  You may receive a patient satisfaction survey over the next few weeks. We would appreciate your feedback and comments so that we may continue to improve ourselves and the health of our patients.  - I will check CT head - use rollator walker - continue physical therapy - trial of carbidopa-levodopa (half tab three times per day; after 2 weeks increase to 1 tab three times per day)   ~~~~~~~~~~~~~~~~~~~~~~~~~~~~~~~~~~~~~~~~~~~~~~~~~~~~~~~~~~~~~~~~~  DR. PENUMALLI'S GUIDE TO HAPPY AND HEALTHY LIVING These are some of my general health and wellness recommendations. Some of them may apply to you better than others. Please use common sense as you try these suggestions and feel free to ask me any questions.   ACTIVITY/FITNESS Mental, social, emotional and physical stimulation are very important for brain and body health. Try learning a new activity (arts, music, language, sports, games).  Keep moving your body to the best of your abilities. You can do this at home, inside or outside, the park, community center, gym or anywhere you like. Consider a physical therapist or personal trainer to get started. Consider the app Sworkit. Fitness trackers such as smart-watches, smart-phones or Fitbits can help as well.   NUTRITION Eat more plants: colorful vegetables, nuts, seeds and berries.  Eat less sugar, salt, preservatives and processed foods.  Avoid toxins such as cigarettes and alcohol.  Drink water when you are thirsty. Warm water with a slice of lemon is an excellent morning drink to start the day.  Consider these websites for more information The Nutrition Source (https://www.henry-hernandez.biz/) Precision Nutrition (WindowBlog.ch)   RELAXATION Consider practicing mindfulness meditation or other relaxation  techniques such as deep breathing, prayer, yoga, tai chi, massage. See website mindful.org or the apps Headspace or Calm to help get started.   SLEEP Try to get at least 7-8+ hours sleep per day. Regular exercise and reduced caffeine will help you sleep better. Practice good sleep hygeine techniques. See website sleep.org for more information.   PLANNING Prepare estate planning, living will, healthcare POA documents. Sometimes this is best planned with the help of an attorney. Theconversationproject.org and agingwithdignity.org are excellent resources.

## 2015-08-15 DIAGNOSIS — R32 Unspecified urinary incontinence: Secondary | ICD-10-CM | POA: Diagnosis not present

## 2015-08-15 DIAGNOSIS — E119 Type 2 diabetes mellitus without complications: Secondary | ICD-10-CM | POA: Diagnosis not present

## 2015-08-15 DIAGNOSIS — J189 Pneumonia, unspecified organism: Secondary | ICD-10-CM | POA: Diagnosis not present

## 2015-08-15 DIAGNOSIS — N183 Chronic kidney disease, stage 3 (moderate): Secondary | ICD-10-CM | POA: Diagnosis not present

## 2015-08-15 DIAGNOSIS — R2681 Unsteadiness on feet: Secondary | ICD-10-CM | POA: Diagnosis not present

## 2015-08-15 DIAGNOSIS — R296 Repeated falls: Secondary | ICD-10-CM | POA: Diagnosis not present

## 2015-08-18 DIAGNOSIS — E559 Vitamin D deficiency, unspecified: Secondary | ICD-10-CM | POA: Diagnosis not present

## 2015-08-18 DIAGNOSIS — E1165 Type 2 diabetes mellitus with hyperglycemia: Secondary | ICD-10-CM | POA: Diagnosis not present

## 2015-08-18 DIAGNOSIS — R296 Repeated falls: Secondary | ICD-10-CM | POA: Diagnosis not present

## 2015-08-18 DIAGNOSIS — R2681 Unsteadiness on feet: Secondary | ICD-10-CM | POA: Diagnosis not present

## 2015-08-18 DIAGNOSIS — E782 Mixed hyperlipidemia: Secondary | ICD-10-CM | POA: Diagnosis not present

## 2015-08-18 DIAGNOSIS — N183 Chronic kidney disease, stage 3 (moderate): Secondary | ICD-10-CM | POA: Diagnosis not present

## 2015-08-18 DIAGNOSIS — E119 Type 2 diabetes mellitus without complications: Secondary | ICD-10-CM | POA: Diagnosis not present

## 2015-08-18 DIAGNOSIS — R32 Unspecified urinary incontinence: Secondary | ICD-10-CM | POA: Diagnosis not present

## 2015-08-18 DIAGNOSIS — J189 Pneumonia, unspecified organism: Secondary | ICD-10-CM | POA: Diagnosis not present

## 2015-08-19 DIAGNOSIS — R32 Unspecified urinary incontinence: Secondary | ICD-10-CM | POA: Diagnosis not present

## 2015-08-19 DIAGNOSIS — R296 Repeated falls: Secondary | ICD-10-CM | POA: Diagnosis not present

## 2015-08-19 DIAGNOSIS — E119 Type 2 diabetes mellitus without complications: Secondary | ICD-10-CM | POA: Diagnosis not present

## 2015-08-19 DIAGNOSIS — J189 Pneumonia, unspecified organism: Secondary | ICD-10-CM | POA: Diagnosis not present

## 2015-08-19 DIAGNOSIS — R2681 Unsteadiness on feet: Secondary | ICD-10-CM | POA: Diagnosis not present

## 2015-08-19 DIAGNOSIS — N183 Chronic kidney disease, stage 3 (moderate): Secondary | ICD-10-CM | POA: Diagnosis not present

## 2015-08-21 DIAGNOSIS — E1122 Type 2 diabetes mellitus with diabetic chronic kidney disease: Secondary | ICD-10-CM | POA: Diagnosis not present

## 2015-08-21 DIAGNOSIS — E1169 Type 2 diabetes mellitus with other specified complication: Secondary | ICD-10-CM | POA: Diagnosis not present

## 2015-08-21 DIAGNOSIS — E559 Vitamin D deficiency, unspecified: Secondary | ICD-10-CM | POA: Diagnosis not present

## 2015-08-21 DIAGNOSIS — C61 Malignant neoplasm of prostate: Secondary | ICD-10-CM | POA: Diagnosis not present

## 2015-08-21 DIAGNOSIS — E782 Mixed hyperlipidemia: Secondary | ICD-10-CM | POA: Diagnosis not present

## 2015-08-21 DIAGNOSIS — N183 Chronic kidney disease, stage 3 (moderate): Secondary | ICD-10-CM | POA: Diagnosis not present

## 2015-08-21 DIAGNOSIS — E1165 Type 2 diabetes mellitus with hyperglycemia: Secondary | ICD-10-CM | POA: Diagnosis not present

## 2015-08-21 DIAGNOSIS — I2510 Atherosclerotic heart disease of native coronary artery without angina pectoris: Secondary | ICD-10-CM | POA: Diagnosis not present

## 2015-08-22 DIAGNOSIS — J189 Pneumonia, unspecified organism: Secondary | ICD-10-CM | POA: Diagnosis not present

## 2015-08-22 DIAGNOSIS — N183 Chronic kidney disease, stage 3 (moderate): Secondary | ICD-10-CM | POA: Diagnosis not present

## 2015-08-22 DIAGNOSIS — R296 Repeated falls: Secondary | ICD-10-CM | POA: Diagnosis not present

## 2015-08-22 DIAGNOSIS — E119 Type 2 diabetes mellitus without complications: Secondary | ICD-10-CM | POA: Diagnosis not present

## 2015-08-22 DIAGNOSIS — R32 Unspecified urinary incontinence: Secondary | ICD-10-CM | POA: Diagnosis not present

## 2015-08-22 DIAGNOSIS — R2681 Unsteadiness on feet: Secondary | ICD-10-CM | POA: Diagnosis not present

## 2015-08-23 ENCOUNTER — Other Ambulatory Visit: Payer: Self-pay | Admitting: Cardiovascular Disease

## 2015-08-25 ENCOUNTER — Ambulatory Visit
Admission: RE | Admit: 2015-08-25 | Discharge: 2015-08-25 | Disposition: A | Discharge status: Home / Self Care | Payer: Commercial Managed Care - HMO | Source: Ambulatory Visit | Attending: Diagnostic Neuroimaging | Admitting: Diagnostic Neuroimaging

## 2015-08-25 DIAGNOSIS — E1142 Type 2 diabetes mellitus with diabetic polyneuropathy: Secondary | ICD-10-CM

## 2015-08-25 DIAGNOSIS — G4752 REM sleep behavior disorder: Secondary | ICD-10-CM

## 2015-08-25 DIAGNOSIS — J189 Pneumonia, unspecified organism: Secondary | ICD-10-CM | POA: Diagnosis not present

## 2015-08-25 DIAGNOSIS — L570 Actinic keratosis: Secondary | ICD-10-CM | POA: Diagnosis not present

## 2015-08-25 DIAGNOSIS — D044 Carcinoma in situ of skin of scalp and neck: Secondary | ICD-10-CM | POA: Diagnosis not present

## 2015-08-25 DIAGNOSIS — R2681 Unsteadiness on feet: Secondary | ICD-10-CM | POA: Diagnosis not present

## 2015-08-25 DIAGNOSIS — G20 Parkinson's disease: Secondary | ICD-10-CM | POA: Diagnosis not present

## 2015-08-25 DIAGNOSIS — R269 Unspecified abnormalities of gait and mobility: Secondary | ICD-10-CM | POA: Diagnosis not present

## 2015-08-25 DIAGNOSIS — D485 Neoplasm of uncertain behavior of skin: Secondary | ICD-10-CM | POA: Diagnosis not present

## 2015-08-25 DIAGNOSIS — R32 Unspecified urinary incontinence: Secondary | ICD-10-CM | POA: Diagnosis not present

## 2015-08-25 DIAGNOSIS — E119 Type 2 diabetes mellitus without complications: Secondary | ICD-10-CM | POA: Diagnosis not present

## 2015-08-25 DIAGNOSIS — R296 Repeated falls: Secondary | ICD-10-CM | POA: Diagnosis not present

## 2015-08-25 DIAGNOSIS — L820 Inflamed seborrheic keratosis: Secondary | ICD-10-CM | POA: Diagnosis not present

## 2015-08-25 DIAGNOSIS — N183 Chronic kidney disease, stage 3 (moderate): Secondary | ICD-10-CM | POA: Diagnosis not present

## 2015-08-25 DIAGNOSIS — R490 Dysphonia: Secondary | ICD-10-CM

## 2015-08-25 NOTE — Telephone Encounter (Signed)
Rx(s) sent to pharmacy electronically.  

## 2015-08-29 DIAGNOSIS — E119 Type 2 diabetes mellitus without complications: Secondary | ICD-10-CM | POA: Diagnosis not present

## 2015-08-29 DIAGNOSIS — N183 Chronic kidney disease, stage 3 (moderate): Secondary | ICD-10-CM | POA: Diagnosis not present

## 2015-08-29 DIAGNOSIS — J189 Pneumonia, unspecified organism: Secondary | ICD-10-CM | POA: Diagnosis not present

## 2015-08-29 DIAGNOSIS — R2681 Unsteadiness on feet: Secondary | ICD-10-CM | POA: Diagnosis not present

## 2015-08-29 DIAGNOSIS — R296 Repeated falls: Secondary | ICD-10-CM | POA: Diagnosis not present

## 2015-08-29 DIAGNOSIS — R32 Unspecified urinary incontinence: Secondary | ICD-10-CM | POA: Diagnosis not present

## 2015-08-30 DIAGNOSIS — N183 Chronic kidney disease, stage 3 (moderate): Secondary | ICD-10-CM | POA: Diagnosis not present

## 2015-08-30 DIAGNOSIS — E119 Type 2 diabetes mellitus without complications: Secondary | ICD-10-CM | POA: Diagnosis not present

## 2015-08-30 DIAGNOSIS — R296 Repeated falls: Secondary | ICD-10-CM | POA: Diagnosis not present

## 2015-08-30 DIAGNOSIS — R2681 Unsteadiness on feet: Secondary | ICD-10-CM | POA: Diagnosis not present

## 2015-08-30 DIAGNOSIS — R32 Unspecified urinary incontinence: Secondary | ICD-10-CM | POA: Diagnosis not present

## 2015-08-30 DIAGNOSIS — J189 Pneumonia, unspecified organism: Secondary | ICD-10-CM | POA: Diagnosis not present

## 2015-09-03 DIAGNOSIS — E119 Type 2 diabetes mellitus without complications: Secondary | ICD-10-CM | POA: Diagnosis not present

## 2015-09-03 DIAGNOSIS — R2681 Unsteadiness on feet: Secondary | ICD-10-CM | POA: Diagnosis not present

## 2015-09-03 DIAGNOSIS — J189 Pneumonia, unspecified organism: Secondary | ICD-10-CM | POA: Diagnosis not present

## 2015-09-03 DIAGNOSIS — R32 Unspecified urinary incontinence: Secondary | ICD-10-CM | POA: Diagnosis not present

## 2015-09-03 DIAGNOSIS — N183 Chronic kidney disease, stage 3 (moderate): Secondary | ICD-10-CM | POA: Diagnosis not present

## 2015-09-03 DIAGNOSIS — R296 Repeated falls: Secondary | ICD-10-CM | POA: Diagnosis not present

## 2015-09-04 ENCOUNTER — Telehealth: Payer: Self-pay | Admitting: Cardiovascular Disease

## 2015-09-04 NOTE — Telephone Encounter (Signed)
LMOVM for Mickel Baas to return call.  Gave device clinic phone number.  Transmission from 08/05/15 sent via [facility].  No changes made at 08/05/15 appointment with Dr. Sallyanne Kuster per [facility] notes.  Will route to Dr. Sallyanne Kuster to make him aware.

## 2015-09-04 NOTE — Telephone Encounter (Signed)
New message   Laurn from [employer] is calling because pt device sent alert on  08-05-15 through carelink  That the Device needs to be evaluated   She needs to know if anything been done to correct the atrial capture

## 2015-09-04 NOTE — Telephone Encounter (Signed)
Message routed to device clinic to follow up on [employer] question

## 2015-09-05 ENCOUNTER — Encounter: Payer: Self-pay | Admitting: Cardiovascular Disease

## 2015-09-05 NOTE — Telephone Encounter (Signed)
Called patient and wife to request that he send a manual transmission for review per request from Dr. Sallyanne Kuster.  Walked patient's wife through transmission process.  Transmission successfully received and Medical Records to scan in to Dr. Victorino December in-basket for review.

## 2015-09-08 DIAGNOSIS — J189 Pneumonia, unspecified organism: Secondary | ICD-10-CM

## 2015-09-08 HISTORY — DX: Pneumonia, unspecified organism: J18.9

## 2015-09-09 ENCOUNTER — Telehealth: Payer: Self-pay | Admitting: Cardiology

## 2015-09-09 NOTE — Telephone Encounter (Signed)
[name] Powell w/ [employer] wanted to know if the under sensing issue was addressed and if so how it was addressed. I read MD note to her and informed her that there was no changes made and that pt is going to continue to follow up remotely. [name] Powell verbalized understanding.

## 2015-09-15 ENCOUNTER — Emergency Department (HOSPITAL_COMMUNITY): Payer: Commercial Managed Care - HMO

## 2015-09-15 ENCOUNTER — Inpatient Hospital Stay (HOSPITAL_COMMUNITY)
Admission: EM | Admit: 2015-09-15 | Discharge: 2015-09-18 | DRG: 291 | Disposition: A | Discharge status: Home / Self Care | Payer: Commercial Managed Care - HMO | Attending: Internal Medicine | Admitting: Internal Medicine

## 2015-09-15 ENCOUNTER — Encounter (HOSPITAL_COMMUNITY): Payer: Self-pay | Admitting: *Deleted

## 2015-09-15 DIAGNOSIS — I509 Heart failure, unspecified: Secondary | ICD-10-CM

## 2015-09-15 DIAGNOSIS — Z87891 Personal history of nicotine dependence: Secondary | ICD-10-CM

## 2015-09-15 DIAGNOSIS — D649 Anemia, unspecified: Secondary | ICD-10-CM | POA: Diagnosis present

## 2015-09-15 DIAGNOSIS — I481 Persistent atrial fibrillation: Secondary | ICD-10-CM

## 2015-09-15 DIAGNOSIS — I10 Essential (primary) hypertension: Secondary | ICD-10-CM | POA: Diagnosis not present

## 2015-09-15 DIAGNOSIS — I110 Hypertensive heart disease with heart failure: Secondary | ICD-10-CM | POA: Diagnosis not present

## 2015-09-15 DIAGNOSIS — F22 Delusional disorders: Secondary | ICD-10-CM | POA: Diagnosis present

## 2015-09-15 DIAGNOSIS — N183 Chronic kidney disease, stage 3 unspecified: Secondary | ICD-10-CM | POA: Diagnosis present

## 2015-09-15 DIAGNOSIS — J90 Pleural effusion, not elsewhere classified: Secondary | ICD-10-CM

## 2015-09-15 DIAGNOSIS — I4819 Other persistent atrial fibrillation: Secondary | ICD-10-CM

## 2015-09-15 DIAGNOSIS — R0902 Hypoxemia: Secondary | ICD-10-CM

## 2015-09-15 DIAGNOSIS — J189 Pneumonia, unspecified organism: Secondary | ICD-10-CM | POA: Diagnosis present

## 2015-09-15 DIAGNOSIS — I130 Hypertensive heart and chronic kidney disease with heart failure and stage 1 through stage 4 chronic kidney disease, or unspecified chronic kidney disease: Secondary | ICD-10-CM | POA: Diagnosis not present

## 2015-09-15 DIAGNOSIS — Z951 Presence of aortocoronary bypass graft: Secondary | ICD-10-CM | POA: Diagnosis not present

## 2015-09-15 DIAGNOSIS — I2510 Atherosclerotic heart disease of native coronary artery without angina pectoris: Secondary | ICD-10-CM | POA: Diagnosis present

## 2015-09-15 DIAGNOSIS — Z7984 Long term (current) use of oral hypoglycemic drugs: Secondary | ICD-10-CM | POA: Diagnosis not present

## 2015-09-15 DIAGNOSIS — I4891 Unspecified atrial fibrillation: Secondary | ICD-10-CM | POA: Diagnosis not present

## 2015-09-15 DIAGNOSIS — R0600 Dyspnea, unspecified: Secondary | ICD-10-CM | POA: Diagnosis not present

## 2015-09-15 DIAGNOSIS — R0602 Shortness of breath: Secondary | ICD-10-CM | POA: Diagnosis not present

## 2015-09-15 DIAGNOSIS — I5031 Acute diastolic (congestive) heart failure: Secondary | ICD-10-CM | POA: Insufficient documentation

## 2015-09-15 DIAGNOSIS — E785 Hyperlipidemia, unspecified: Secondary | ICD-10-CM | POA: Diagnosis present

## 2015-09-15 DIAGNOSIS — J9601 Acute respiratory failure with hypoxia: Secondary | ICD-10-CM | POA: Diagnosis not present

## 2015-09-15 DIAGNOSIS — G20 Parkinson's disease: Secondary | ICD-10-CM | POA: Diagnosis not present

## 2015-09-15 DIAGNOSIS — Z8546 Personal history of malignant neoplasm of prostate: Secondary | ICD-10-CM

## 2015-09-15 DIAGNOSIS — E1122 Type 2 diabetes mellitus with diabetic chronic kidney disease: Secondary | ICD-10-CM | POA: Diagnosis present

## 2015-09-15 DIAGNOSIS — Z7901 Long term (current) use of anticoagulants: Secondary | ICD-10-CM

## 2015-09-15 DIAGNOSIS — Z950 Presence of cardiac pacemaker: Secondary | ICD-10-CM | POA: Diagnosis present

## 2015-09-15 DIAGNOSIS — R509 Fever, unspecified: Secondary | ICD-10-CM | POA: Diagnosis not present

## 2015-09-15 DIAGNOSIS — I5033 Acute on chronic diastolic (congestive) heart failure: Secondary | ICD-10-CM | POA: Diagnosis present

## 2015-09-15 DIAGNOSIS — I442 Atrioventricular block, complete: Secondary | ICD-10-CM

## 2015-09-15 DIAGNOSIS — E119 Type 2 diabetes mellitus without complications: Secondary | ICD-10-CM

## 2015-09-15 HISTORY — DX: Pleural effusion, not elsewhere classified: J90

## 2015-09-15 LAB — COMPREHENSIVE METABOLIC PANEL
ALBUMIN: 3.1 g/dL — AB (ref 3.5–5.0)
ALT: 6 U/L — ABNORMAL LOW (ref 17–63)
AST: 21 U/L (ref 15–41)
Alkaline Phosphatase: 95 U/L (ref 38–126)
Anion gap: 11 (ref 5–15)
BUN: 26 mg/dL — AB (ref 6–20)
CHLORIDE: 99 mmol/L — AB (ref 101–111)
CO2: 24 mmol/L (ref 22–32)
Calcium: 8.4 mg/dL — ABNORMAL LOW (ref 8.9–10.3)
Creatinine, Ser: 1.59 mg/dL — ABNORMAL HIGH (ref 0.61–1.24)
GFR calc Af Amer: 42 mL/min — ABNORMAL LOW (ref >60)
GFR, EST NON AFRICAN AMERICAN: 36 mL/min — AB (ref >60)
Glucose, Bld: 238 mg/dL — ABNORMAL HIGH (ref 65–99)
POTASSIUM: 4.4 mmol/L (ref 3.5–5.1)
SODIUM: 134 mmol/L — AB (ref 135–145)
Total Bilirubin: 0.9 mg/dL (ref 0.3–1.2)
Total Protein: 7 g/dL (ref 6.5–8.1)

## 2015-09-15 LAB — URINE MICROSCOPIC-ADD ON: WBC UA: NONE SEEN WBC/hpf (ref 0–5)

## 2015-09-15 LAB — GLUCOSE, CAPILLARY
GLUCOSE-CAPILLARY: 127 mg/dL — AB (ref 65–99)
GLUCOSE-CAPILLARY: 141 mg/dL — AB (ref 65–99)

## 2015-09-15 LAB — CBC WITH DIFFERENTIAL/PLATELET
BASOS ABS: 0 10*3/uL (ref 0.0–0.1)
BASOS PCT: 0 %
EOS ABS: 0.1 10*3/uL (ref 0.0–0.7)
Eosinophils Relative: 2 %
HCT: 32.3 % — ABNORMAL LOW (ref 39.0–52.0)
HEMOGLOBIN: 10.2 g/dL — AB (ref 13.0–17.0)
Lymphocytes Relative: 16 %
Lymphs Abs: 0.8 10*3/uL (ref 0.7–4.0)
MCH: 30.3 pg (ref 26.0–34.0)
MCHC: 31.6 g/dL (ref 30.0–36.0)
MCV: 95.8 fL (ref 78.0–100.0)
Monocytes Absolute: 0.8 10*3/uL (ref 0.1–1.0)
Monocytes Relative: 15 %
Neutro Abs: 3.5 10*3/uL (ref 1.7–7.7)
Neutrophils Relative %: 67 %
Platelets: 195 10*3/uL (ref 150–400)
RBC: 3.37 MIL/uL — AB (ref 4.22–5.81)
RDW: 15.6 % — ABNORMAL HIGH (ref 11.5–15.5)
WBC: 5.1 10*3/uL (ref 4.0–10.5)

## 2015-09-15 LAB — I-STAT CG4 LACTIC ACID, ED
LACTIC ACID, VENOUS: 2.35 mmol/L — AB (ref 0.5–2.0)
LACTIC ACID, VENOUS: 2.02 mmol/L — AB (ref 0.5–2.0)

## 2015-09-15 LAB — URINALYSIS, ROUTINE W REFLEX MICROSCOPIC
BILIRUBIN URINE: NEGATIVE
Glucose, UA: NEGATIVE mg/dL
Ketones, ur: NEGATIVE mg/dL
LEUKOCYTES UA: NEGATIVE
NITRITE: NEGATIVE
Protein, ur: 100 mg/dL — AB
SPECIFIC GRAVITY, URINE: 1.017 (ref 1.005–1.030)
pH: 6.5 (ref 5.0–8.0)

## 2015-09-15 LAB — BRAIN NATRIURETIC PEPTIDE: B NATRIURETIC PEPTIDE 5: 660.5 pg/mL — AB (ref 0.0–100.0)

## 2015-09-15 MED ORDER — ONDANSETRON HCL 4 MG/2ML IJ SOLN: 4.0000 mg | Freq: Four times a day (QID) | INTRAMUSCULAR | Status: DC | PRN

## 2015-09-15 MED ORDER — SODIUM CHLORIDE 0.9 % IV SOLN: 250.0000 mL | INTRAVENOUS | Status: DC | PRN

## 2015-09-15 MED ORDER — CARBIDOPA-LEVODOPA 25-100 MG PO TABS
1.0000 | ORAL_TABLET | Freq: Three times a day (TID) | ORAL | Status: DC
  Administered 2015-09-16 – 2015-09-18 (×9): 1 via ORAL
  Filled 2015-09-15 (×12): qty 1

## 2015-09-15 MED ORDER — HYDROCODONE-ACETAMINOPHEN 5-325 MG PO TABS: 1.0000 | ORAL_TABLET | ORAL | Status: DC | PRN

## 2015-09-15 MED ORDER — DEXTROSE 5 % IV SOLN
1.0000 g | INTRAVENOUS | Status: DC
  Administered 2015-09-16 – 2015-09-18 (×3): 1 g via INTRAVENOUS
  Filled 2015-09-15 (×3): qty 10

## 2015-09-15 MED ORDER — ACETAMINOPHEN 650 MG RE SUPP: 650.0000 mg | Freq: Four times a day (QID) | RECTAL | Status: DC | PRN

## 2015-09-15 MED ORDER — BENZONATATE 100 MG PO CAPS: 100.0000 mg | ORAL_CAPSULE | Freq: Two times a day (BID) | ORAL | Status: DC | PRN

## 2015-09-15 MED ORDER — SODIUM CHLORIDE 0.9% FLUSH
3.0000 mL | Freq: Two times a day (BID) | INTRAVENOUS | Status: DC
  Administered 2015-09-15 – 2015-09-18 (×6): 3 mL via INTRAVENOUS

## 2015-09-15 MED ORDER — FUROSEMIDE 10 MG/ML IJ SOLN: 80.0000 mg | Freq: Every day | INTRAMUSCULAR | Status: DC

## 2015-09-15 MED ORDER — DEXTROSE 5 % IV SOLN
500.0000 mg | Freq: Once | INTRAVENOUS | Status: AC
  Administered 2015-09-15: 500 mg via INTRAVENOUS
  Filled 2015-09-15: qty 500

## 2015-09-15 MED ORDER — DEXTROSE 5 % IV SOLN
1.0000 g | Freq: Once | INTRAVENOUS | Status: AC
  Administered 2015-09-15: 1 g via INTRAVENOUS
  Filled 2015-09-15: qty 10

## 2015-09-15 MED ORDER — IPRATROPIUM-ALBUTEROL 0.5-2.5 (3) MG/3ML IN SOLN
3.0000 mL | Freq: Four times a day (QID) | RESPIRATORY_TRACT | Status: DC
  Administered 2015-09-15 – 2015-09-17 (×8): 3 mL via RESPIRATORY_TRACT
  Filled 2015-09-15 (×9): qty 3

## 2015-09-15 MED ORDER — SODIUM CHLORIDE 0.9 % IV SOLN: INTRAVENOUS | Status: DC

## 2015-09-15 MED ORDER — POTASSIUM CHLORIDE CRYS ER 10 MEQ PO TBCR: 5.0000 meq | EXTENDED_RELEASE_TABLET | Freq: Every day | ORAL | Status: DC

## 2015-09-15 MED ORDER — SENNOSIDES-DOCUSATE SODIUM 8.6-50 MG PO TABS: 1.0000 | ORAL_TABLET | Freq: Every evening | ORAL | Status: DC | PRN

## 2015-09-15 MED ORDER — FUROSEMIDE 10 MG/ML IJ SOLN
80.0000 mg | Freq: Once | INTRAMUSCULAR | Status: AC
  Administered 2015-09-15: 80 mg via INTRAVENOUS
  Filled 2015-09-15: qty 8

## 2015-09-15 MED ORDER — SODIUM CHLORIDE 0.9 % IV BOLUS (SEPSIS)
1000.0000 mL | Freq: Once | INTRAVENOUS | Status: AC
  Administered 2015-09-15: 1000 mL via INTRAVENOUS

## 2015-09-15 MED ORDER — FUROSEMIDE 10 MG/ML IJ SOLN
20.0000 mg | Freq: Every day | INTRAMUSCULAR | Status: DC
  Administered 2015-09-16 – 2015-09-17 (×2): 20 mg via INTRAVENOUS
  Filled 2015-09-15 (×2): qty 2

## 2015-09-15 MED ORDER — DEXTROSE 5 % IV SOLN
500.0000 mg | INTRAVENOUS | Status: DC
  Administered 2015-09-16 – 2015-09-17 (×2): 500 mg via INTRAVENOUS
  Filled 2015-09-15 (×5): qty 500

## 2015-09-15 MED ORDER — POLYETHYLENE GLYCOL 3350 17 G PO PACK
17.0000 g | PACK | Freq: Every day | ORAL | Status: DC
  Administered 2015-09-16 – 2015-09-18 (×3): 17 g via ORAL
  Filled 2015-09-15 (×4): qty 1

## 2015-09-15 MED ORDER — SODIUM CHLORIDE 0.9% FLUSH: 3.0000 mL | INTRAVENOUS | Status: DC | PRN

## 2015-09-15 MED ORDER — INSULIN ASPART 100 UNIT/ML ~~LOC~~ SOLN
0.0000 [IU] | Freq: Three times a day (TID) | SUBCUTANEOUS | Status: DC
  Administered 2015-09-15: 1 [IU] via SUBCUTANEOUS
  Administered 2015-09-16: 2 [IU] via SUBCUTANEOUS
  Administered 2015-09-16: 3 [IU] via SUBCUTANEOUS
  Administered 2015-09-17: 1 [IU] via SUBCUTANEOUS
  Administered 2015-09-17: 3 [IU] via SUBCUTANEOUS
  Administered 2015-09-17 – 2015-09-18 (×2): 5 [IU] via SUBCUTANEOUS
  Administered 2015-09-18: 1 [IU] via SUBCUTANEOUS

## 2015-09-15 MED ORDER — APIXABAN 2.5 MG PO TABS
2.5000 mg | ORAL_TABLET | Freq: Two times a day (BID) | ORAL | Status: DC
  Administered 2015-09-15 – 2015-09-18 (×6): 2.5 mg via ORAL
  Filled 2015-09-15 (×6): qty 1

## 2015-09-15 MED ORDER — METOPROLOL TARTRATE 100 MG PO TABS
100.0000 mg | ORAL_TABLET | Freq: Every day | ORAL | Status: DC
  Administered 2015-09-16 – 2015-09-18 (×3): 100 mg via ORAL
  Filled 2015-09-15 (×3): qty 1

## 2015-09-15 MED ORDER — ATORVASTATIN CALCIUM 20 MG PO TABS
20.0000 mg | ORAL_TABLET | Freq: Every day | ORAL | Status: DC
  Administered 2015-09-16 – 2015-09-18 (×3): 20 mg via ORAL
  Filled 2015-09-15 (×5): qty 1

## 2015-09-15 MED ORDER — ONDANSETRON HCL 4 MG PO TABS: 4.0000 mg | ORAL_TABLET | Freq: Four times a day (QID) | ORAL | Status: DC | PRN

## 2015-09-15 MED ORDER — TRAZODONE HCL 50 MG PO TABS: 25.0000 mg | ORAL_TABLET | Freq: Every evening | ORAL | Status: DC | PRN

## 2015-09-15 MED ORDER — ACETAMINOPHEN 325 MG PO TABS: 650.0000 mg | ORAL_TABLET | Freq: Four times a day (QID) | ORAL | Status: DC | PRN

## 2015-09-15 MED ORDER — BENZONATATE 100 MG PO CAPS
100.0000 mg | ORAL_CAPSULE | Freq: Three times a day (TID) | ORAL | Status: DC
  Administered 2015-09-15 – 2015-09-18 (×10): 100 mg via ORAL
  Filled 2015-09-15 (×11): qty 1

## 2015-09-15 MED ORDER — INSULIN ASPART 100 UNIT/ML ~~LOC~~ SOLN: 0.0000 [IU] | Freq: Every day | SUBCUTANEOUS | Status: DC

## 2015-09-15 MED ORDER — IPRATROPIUM-ALBUTEROL 0.5-2.5 (3) MG/3ML IN SOLN
3.0000 mL | Freq: Once | RESPIRATORY_TRACT | Status: AC
  Administered 2015-09-15: 3 mL via RESPIRATORY_TRACT
  Filled 2015-09-15: qty 3

## 2015-09-15 MED ORDER — MORPHINE SULFATE (PF) 2 MG/ML IV SOLN: 1.0000 mg | INTRAVENOUS | Status: DC | PRN

## 2015-09-15 MED ORDER — METOPROLOL TARTRATE 50 MG PO TABS
50.0000 mg | ORAL_TABLET | Freq: Every day | ORAL | Status: DC
  Administered 2015-09-15 – 2015-09-17 (×3): 50 mg via ORAL
  Filled 2015-09-15 (×3): qty 1

## 2015-09-15 NOTE — ED Provider Notes (Signed)
CSN: [id number]     Arrival date & time 09/15/15  1216 History   First MD Initiated Contact with Patient 09/15/15 1309     Chief Complaint  Patient presents with  . Shortness of Breath  . Cough    HPI Comments: 82 year old male presents with shortness breath, cough, generalized malaise for the past week. He was seen at [place] today for his symptoms who sent him here for further evalution. PMH significant for CAD s/p CABG in 2003, A.fib with pacemaker, DM type 2, HTN, HLD, CHF, and recently diagnosed Parkinson's disease. CT of head in April revealed no acute changes. He last completed a course of antibiotics in March but was not hospitalized at the time. Family reports fever at home of 101, productive cough, and worsening lower leg edema since his dose of Lasix was decreased in February. Patient denies chills, HA, chest pain, palpitations, abdominal pain, N/V/D, dysuria. Last nuclear study was Augsst 2015. Echo was in 2015 and EF was 55%     Patient is a 82 y.o. male presenting with shortness of breath and cough.  Shortness of Breath Associated symptoms: cough   Associated symptoms: no abdominal pain, no chest pain, no diaphoresis, no fever, no vomiting and no wheezing   Cough Associated symptoms: shortness of breath   Associated symptoms: no chest pain, no chills, no diaphoresis, no fever and no wheezing     Past Medical History  Diagnosis Date  . Diabetes mellitus   . Hypertension   . Pacemaker 12/12/2006    [employer] adapta  . Coronary artery disease   . 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 CHB (complete heart block) ([place])   . Dyslipidemia   . H/O prostate cancer   . Ventricular tachycardia (paroxysmal) ([place]) 03/28/2014  . New onset atrial flutter, persistent 07/04/2014  . Atrial fibrillation ([place])   . Cancer ([place])   . Prostate cancer [facility])    Past Surgical History  Procedure Laterality Date  . Pacemaker insertion   12/12/2006    [employer]onic adapta  . Coronary artery bypass graft  09/04/2001    LIMA to LAD,SVG to diagonal,SVG to ramus intermedius,SVG to PDA  . Prostate surgery  2001    cancer  . Nm myoview ltd  01/09/2010    no ischemia  . Tonsillectomy     Family History  Problem Relation Age of Onset  . Heart disease Mother    Social History  Substance Use Topics  . Smoking status: Former Smoker    Quit date: 05/14/1962  . Smokeless tobacco: None  . Alcohol Use: Yes     Comment: seldom    Review of Systems  Constitutional: Negative for fever, chills and diaphoresis.  Respiratory: Positive for cough and shortness of breath. Negative for wheezing.   Cardiovascular: Positive for leg swelling. Negative for chest pain and palpitations.  Gastrointestinal: Negative for nausea, vomiting, abdominal pain and diarrhea.  Genitourinary: Negative for dysuria.  All other systems reviewed and are negative.     Allergies  Mexitil and Lanoxin  Home Medications   Prior to Admission medications   Medication Sig Start Date End Date Taking? Authorizing Provider  apixaban (ELIQUIS) 5 MG TABS tablet TAKE 1 TABLET (5 MG TOTAL) BY MOUTH 2 (TWO) TIMES DAILY. 07/15/15  Yes [name], MD  atorvastatin (LIPITOR) 20 MG tablet Take 20 mg by mouth daily.   Yes Historical Provider, MD  benzonatate (  TESSALON) 100 MG capsule Take 1 capsule by mouth 3 (three) times daily. For 10 days. 07/24/15  Yes Historical Provider, MD  carbidopa-levodopa (SINEMET IR) 25-100 MG tablet Take 1 tablet by mouth 3 (three) times daily before meals. 08/13/15  Yes [name], MD  Cholecalciferol (VITAMIN D PO) Take 5,000 Units by mouth 2 (two) times a week. On Friday   Yes Historical Provider, MD  furosemide (LASIX) 20 MG tablet Take 1 tablet (20 mg total) by mouth daily. 08/25/15  Yes [name], MD  glimepiride (AMARYL) 4 MG tablet Take 4 mg by mouth daily before breakfast.   Yes Historical Provider, MD  JANUVIA 50 MG tablet Take  1 tablet by mouth daily. 05/27/15  Yes Historical Provider, MD  metFORMIN (GLUCOPHAGE-XR) 500 MG 24 hr tablet Take 500 mg by mouth daily. 02/10/15  Yes Historical Provider, MD  metoprolol (LOPRESSOR) 50 MG tablet Take 2 tablets (100mg ) by mouth in the morning and 1 tablet (50mg ) in the evening. 04/21/15  Yes [name], MD  mometasone (ELOCON) 0.1 % cream APPLY TO LEGS AS NEEDED FOR FOOT DERMATITIS 01/10/15  Yes Historical Provider, MD  Multiple Vitamins-Minerals (PRESERVISION AREDS) CAPS Take 1 capsule by mouth 2 (two) times daily.   Yes Historical Provider, MD  polyethylene glycol (MIRALAX / GLYCOLAX) packet Take 17 g by mouth daily. To prevent constipation   Yes Historical Provider, MD  potassium chloride (K-DUR,KLOR-CON) 10 MEQ tablet Take 0.5 tablets (5 mEq total) by mouth daily. 08/05/15  Yes [name], MD  valsartan (DIOVAN) 160 MG tablet TAKE 1 TABLET EVERY DAY 05/26/15  Yes [name], MD   BP 134/63 mmHg  Pulse 60  Temp(Src) 99.1 F (37.3 C) (Rectal)  Resp 24  Wt 86.268 kg  SpO2 96%   Physical Exam  Constitutional: He is oriented to person, place, and time. He appears well-developed and well-nourished. No distress.  Elderly male in NAD, slow to respond  HENT:  Head: Normocephalic and atraumatic.  Eyes: Conjunctivae are normal. Pupils are equal, round, and reactive to light. Right eye exhibits no discharge. Left eye exhibits no discharge. No scleral icterus.  Neck: Normal range of motion.  Cardiovascular: Normal rate, regular rhythm and intact distal pulses.  Exam reveals no gallop and no friction rub.   No murmur heard. Pulmonary/Chest: Effort normal. No accessory muscle usage. Tachypnea noted. No respiratory distress. He has no wheezes. He has rhonchi in the right lower field, the left middle field and the left lower field. He has rales in the right lower field, the left middle field and the left lower field. He exhibits no tenderness.  Abdominal: Soft. Bowel sounds are  normal. He exhibits no distension and no mass. There is no tenderness. There is no rebound and no guarding.  Neurological: He is alert and oriented to person, place, and time.  Skin: Skin is warm and dry.  Psychiatric: He has a normal mood and affect.    ED Course  Procedures (including critical care time) Labs Review Labs Reviewed  COMPREHENSIVE METABOLIC PANEL - Abnormal; Notable for the following:    Sodium 134 (*)    Chloride 99 (*)    Glucose, Bld 238 (*)    BUN 26 (*)    Creatinine, Ser 1.59 (*)    Calcium 8.4 (*)    Albumin 3.1 (*)    ALT 6 (*)    GFR calc non Af Amer 36 (*)    GFR calc Af Amer 42 (*)  All other components within normal limits  CBC WITH DIFFERENTIAL/PLATELET - Abnormal; Notable for the following:    RBC 3.37 (*)    Hemoglobin 10.2 (*)    HCT 32.3 (*)    RDW 15.6 (*)    All other components within normal limits  URINALYSIS, ROUTINE W REFLEX MICROSCOPIC (NOT AT [facility]) - Abnormal; Notable for the following:    Hgb urine dipstick SMALL (*)    Protein, ur 100 (*)    All other components within normal limits  BRAIN NATRIURETIC PEPTIDE - Abnormal; Notable for the following:    B Natriuretic Peptide 660.5 (*)    All other components within normal limits  URINE MICROSCOPIC-ADD ON - Abnormal; Notable for the following:    Squamous Epithelial / LPF 0-5 (*)    Bacteria, UA FEW (*)    All other components within normal limits  I-STAT CG4 LACTIC ACID, ED - Abnormal; Notable for the following:    Lactic Acid, Venous 2.35 (*)    All other components within normal limits  I-STAT CG4 LACTIC ACID, ED - Abnormal; Notable for the following:    Lactic Acid, Venous 2.02 (*)    All other components within normal limits  CULTURE, BLOOD (ROUTINE X 2)  CULTURE, BLOOD (ROUTINE X 2)  URINE CULTURE    Imaging Review Dg Chest Portable 1 View  09/15/2015  CLINICAL DATA:  Cough and fever. EXAM: PORTABLE CHEST - 1 VIEW COMPARISON:  Two-view chest x-ray 07/24/2015 FINDINGS:  The heart is enlarged. Progressive pulmonary vascular congestion and edema is present. Left lower lobe airspace disease has progressed. A left pleural effusion is present. Interstitial and airspace disease is present at the right base as well. Pacing wires are stable. The patient is status post median sternotomy. Pacing wires are stable. IMPRESSION: 1. Cardiomegaly with increasing interstitial edema suggesting congestive heart failure. 2. Progressive left lower lobe airspace disease worrisome for aspiration or pneumonia. 3. Progressive left-sided pleural effusion. Electronically Signed   By: San Morelle M.D.   On: 09/15/2015 13:17   I have personally reviewed and evaluated these images and lab results as part of my medical decision-making.   EKG Interpretation   Date/Time:  Monday Sep 15 2015 12:24:12 EDT Ventricular Rate:  57 PR Interval:  172 QRS Duration: 186 QT Interval:  470 QTC Calculation: 496 R Axis:   -83 Text Interpretation:  AV dual-paced rhythm with occasional Premature  ventricular complexes Abnormal ECG Confirmed by ZACKOWSKI  MD, SCOTT  ([id number]) on 09/15/2015 4:54:59 PM       MDM   Final diagnoses:  CAP (community acquired pneumonia)  Acute on chronic congestive heart failure, unspecified congestive heart failure type [facility])   82 year old male who presents with increasing SOB and cough for the past week. Likely a combination of PNA and worsening CHF. Patient was hypoxic in to the upper 80s on arrival and placed on 2L O2 which he does not wear at home. He is tachypneic as well but afebrile and slightly hypertensive. PE reveals course breath sounds and rhonchi. CXR shows cardiomegaly with increasing interstitial edema and progressive left lower lobe PNA vs aspiration and left sided pleural effusion.  Lactic acid is 2.35. Code Sepsis called. Azithromycin and Ceftriaxone started empirically. Blood cultures ordered. Duoneb given. BNP elevated in to the 600s, Lasix given.  BMP is notable for elevated BUN/SCr 26/1.59 which is his baseline. CBC shows chronic anemia which is close to his baseline 9.2/29.1. No leukocytosis. UA is clean.  Shared visit with Dr. Rogene Houston. Hospitalist consulted for admission.     Recardo Evangelist, PA-C 09/16/15 [phone]

## 2015-09-15 NOTE — ED Notes (Signed)
Pt placed on 2L [place] in response to SpO2.

## 2015-09-15 NOTE — ED Notes (Signed)
Patient has not felt well all week last week. He became worse this weekend with productive cough.  Patient reports weakness.  He was seen at [facility] today and send to ED for evaluation.  Patient is alert.  He denies any pain.  Denies any n/v.  Denies falls.  Patient pulse ox reported to be 90% at Md office.  Patient with reported productive cough, some clear to yellow in color.  Patient has taken his morning meds.  Patient is diabetic.  He takes oral meds.

## 2015-09-15 NOTE — ED Notes (Signed)
Attempted report 

## 2015-09-15 NOTE — H&P (Signed)
[facility] History and Physical  TZURIEL BOEHNKE [id number] DOB: 1922-08-04 DOA: 09/15/2015  Referring physician: edp PCP: [name], MD   Chief Complaint: sob/generalized weakness/cough  HPI: [name] Powell is a very pleasant 82 y.o. male with a past medical history that includes ED status post CABG 2003, A. fib with pacemaker on Eliquis, diabetes, hypertension, HF, Parkinson's presents to emergency Department chief complaint worsening shortness of breath she'll eyes weakness and increased cough. Initial evaluation reveals acute respiratory failure with hypoxia related to communicate they are ammonia and mild CHF exacerbation.  Patient reports generalized malaise over the last 7 days associated with decreased oral intake increased coughing chills and subjective fever. He is not on oxygen at home. He denies chest pain palpitation headache dizziness syncope or near-syncope. He denies abdominal pain nausea vomiting diarrhea constipation melena bright red blood per rectum. He denies dysuria hematuria frequency or urgency. He reports compliance with all his medications and no recent changes. He went to his primary care provider today who referred him to the emergency department  Emergency department he is afebrile hemodynamically stable with hypoxia of 88% on room air at rest. He is provided with DuoNeb's IV antibiotics and IV Lasix in the emergency department   Review of Systems:  10 point review of systems complete and all systems are negative except as indicated in the history of present illness.   Past Medical History  Diagnosis Date  . Diabetes mellitus   . Hypertension   . Pacemaker 12/12/2006    Medtronic adapta  . Coronary artery disease   . 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 CHB (complete heart block) ([place])   . Dyslipidemia   . H/O prostate cancer   .  Ventricular tachycardia (paroxysmal) ([place]) 03/28/2014  . New onset atrial flutter, persistent 07/04/2014  . Atrial fibrillation ([place])   . Cancer ([place])   . Prostate cancer ([place])   . "Walking corpse" syndrome    Past Surgical History  Procedure Laterality Date  . Pacemaker insertion  12/12/2006    Medtronic adapta  . Coronary artery bypass graft  09/04/2001    LIMA to LAD,SVG to diagonal,SVG to ramus intermedius,SVG to PDA  . Prostate surgery  2001    cancer  . Nm myoview ltd  01/09/2010    no ischemia  . Tonsillectomy     Social History:  reports that he quit smoking about 53 years ago. He does not have any smokeless tobacco history on file. He reports that he drinks alcohol. He reports that he does not use illicit drugs. He lives at home with his wife has done so for the last 40+ years. 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 Independent with ADLs Allergies  Allergen Reactions  . Mexitil [Mexiletine]     unknown  . Lanoxin [Digoxin] Rash    Eyelid rash    Family History  Problem Relation Age of Onset  . Heart disease Mother    He has 3 siblings whose collective medical history positive for diabetes heart disease.   Prior to Admission medications   Medication Sig Start Date End Date Taking? Authorizing Provider  apixaban (ELIQUIS) 5 MG TABS tablet TAKE 1 TABLET (5 MG TOTAL) BY MOUTH 2 (TWO) TIMES DAILY. 07/15/15  Yes [name], MD  atorvastatin (LIPITOR) 20 MG tablet Take 20 mg by mouth daily.   Yes Historical Provider, MD  benzonatate (TESSALON) 100  MG capsule Take 1 capsule by mouth 3 (three) times daily. For 10 days. 07/24/15  Yes Historical Provider, MD  carbidopa-levodopa (SINEMET IR) 25-100 MG tablet Take 1 tablet by mouth 3 (three) times daily before meals. 08/13/15  Yes [name], MD  Cholecalciferol (VITAMIN D PO) Take 5,000 Units  by mouth 2 (two) times a week. On Friday   Yes Historical Provider, MD  furosemide (LASIX) 20 MG tablet Take 1 tablet (20 mg total) by mouth daily. 08/25/15  Yes [name], MD  glimepiride (AMARYL) 4 MG tablet Take 4 mg by mouth daily before breakfast.   Yes Historical Provider, MD  JANUVIA 50 MG tablet Take 1 tablet by mouth daily. 05/27/15  Yes Historical Provider, MD  metFORMIN (GLUCOPHAGE-XR) 500 MG 24 hr tablet Take 500 mg by mouth daily. 02/10/15  Yes Historical Provider, MD  metoprolol (LOPRESSOR) 50 MG tablet Take 2 tablets (100mg ) by mouth in the morning and 1 tablet (50mg ) in the evening. 04/21/15  Yes [name], MD  mometasone (ELOCON) 0.1 % cream APPLY TO LEGS AS NEEDED FOR FOOT DERMATITIS 01/10/15  Yes Historical Provider, MD  Multiple Vitamins-Minerals (PRESERVISION AREDS) CAPS Take 1 capsule by mouth 2 (two) times daily.   Yes Historical Provider, MD  polyethylene glycol (MIRALAX / GLYCOLAX) packet Take 17 g by mouth daily. To prevent constipation   Yes Historical Provider, MD  potassium chloride (K-DUR,KLOR-CON) 10 MEQ tablet Take 0.5 tablets (5 mEq total) by mouth daily. 08/05/15  Yes [name], MD  valsartan (DIOVAN) 160 MG tablet TAKE 1 TABLET EVERY DAY 05/26/15  Yes [name], MD   Physical Exam: Filed Vitals:   09/15/15 1615 09/15/15 1630 09/15/15 1700 09/15/15 1715  BP: 125/57 111/43 145/61 146/65  Pulse: 53 56 61 60  Temp:      TempSrc:      Resp: 26 26 17 22   Weight:      SpO2: 96% 99% 97% 97%    Wt Readings from Last 3 Encounters:  09/15/15 86.268 kg (190 lb 3 oz)  08/13/15 87.907 kg (193 lb 12.8 oz)  08/05/15 82.918 kg (182 lb 12.8 oz)     General: Appears calm and comfortable, slightly flushed   Eyes: PERRL, normal lids, irises & conjunctiva  ENT: grossly normal hearing, lips & tongue, his membranes of his mouth are pink but  slightly dry   Neck: no LAD, masses or thyromegaly  Cardiovascular: irregularly irregular, no m/r/g. trace LE edema bilaterally.   Respiratory: Rest sounds somewhat diminished. Diffuse rhonchi with end expiratory faint wheeze. Crackles bilateral bases. Normal respiratory effort somewhat shallow  Abdomen: soft, ntnd no guarding or rebounding positive bowel sounds throughout   Skin: no rash or induration seen on limited exam  Musculoskeletal: grossly normal tone BUE/BLE, joints without swelling/erythema   Psychiatric: grossly normal mood and affect, speech fluent and appropriate  Neurologic: grossly non-focal. speech clear facial symmetry cranial nerves II through XII intact bilateral grip 5 out of 5           Labs on Admission:  Basic Metabolic Panel:  Last Labs      Recent Labs Lab 09/15/15 1243  NA 134*  K 4.4  CL 99*  CO2 24  GLUCOSE 238*  BUN 26*  CREATININE 1.59*  CALCIUM 8.4*     Liver Function Tests:  Last Labs      Recent Labs Lab 09/15/15 1243  AST 21  ALT 6*  ALKPHOS 95  BILITOT 0.9  PROT 7.0  ALBUMIN 3.1*      Last Labs     No results for input(s): LIPASE, AMYLASE in the last 168 hours.    Last Labs     No results for input(s): AMMONIA in the last 168 hours.   CBC:  Last Labs      Recent Labs Lab 09/15/15 1243  WBC 5.1  NEUTROABS 3.5  HGB 10.2*  HCT 32.3*  MCV 95.8  PLT 195     Cardiac Enzymes:  Last Labs     No results for input(s): CKTOTAL, CKMB, CKMBINDEX, TROPONINI in the last 168 hours.    BNP (last 3 results)  Recent Labs (within last 365 days)     Recent Labs  02/20/15 0018 09/15/15 1243  BNP 592.5* 660.5*      ProBNP (last 3 results)  Recent Labs (within last 365 days)    No results for input(s): PROBNP in the last 8760 hours.    CBG:  Last Labs     No results for input(s): GLUCAP in the last 168 hours.    Radiological Exams on  Admission:  Imaging Results (Last 48 hours)    Dg Chest Portable 1 View  09/15/2015 CLINICAL DATA: Cough and fever. EXAM: PORTABLE CHEST - 1 VIEW COMPARISON: Two-view chest x-ray 07/24/2015 FINDINGS: The heart is enlarged. Progressive pulmonary vascular congestion and edema is present. Left lower lobe airspace disease has progressed. A left pleural effusion is present. Interstitial and airspace disease is present at the right base as well. Pacing wires are stable. The patient is status post median sternotomy. Pacing wires are stable. IMPRESSION: 1. Cardiomegaly with increasing interstitial edema suggesting congestive heart failure. 2. Progressive left lower lobe airspace disease worrisome for aspiration or pneumonia. 3. Progressive left-sided pleural effusion. Electronically Signed By: San Morelle M.D. On: 09/15/2015 13:17     EKG: Independently reviewed.AV dual-paced rhythm with occasional Premature ventricular complexes Abnormal ECG  Assessment/Plan Principal Problem:  Acute respiratory failure with hypoxia (HCC) Active Problems:  Pacemaker dependent - Medtronic  CAD s/p CABG 2003  DM2 (diabetes mellitus, type 2) ([place])  HTN (hypertension)  CAP (community acquired pneumonia)  Acute on chronic diastolic (congestive) heart failure ([place])  "Walking corpse" syndrome  Atrial fibrillation (HCC)  CKD (chronic kidney disease), stage III  1. Acute Respiratory failure with hypoxia secondary to community-acquired pneumonia and CHF exacerbation. Patient does not wear oxygen at home. Oxygen saturation level 88% on room air at rest in the emergency department. Chest x-ray reveals cardiomegaly with increasing interstitial edema suggesting CHF as well as progressive left lower lobe airspace disease worrisome for aspiration or pneumonia and progressive left-sided pleural effusion. BNP somewhat elevated, lactic acid slightly elevated. He is afebrile no leukocytosis nontoxic appearing.  Hemodynamically stable -Admit to telemetry -Continue Rocephin and azithromycin -Follow blood cultures -Trend lactic acid -Continue IV Lasix -Monitor intake and output -Obtain daily weights -Nebulizers as needed -Obtain echo  2. Community-acquired pneumonia. On admission he is afebrile with no leukocytosis. He is nontoxic appearing. He has a slightly elevated lactic acid. -Antibiotics as noted above -Follow blood cultures -Obtain sputum cultures as able -Continue oxygen supplementation keep oxygen level greater than 90% -Wean oxygen as able -Antitussives  #3. Acute on chronic last heart failure. Cogentin 2015 with EF of [id number] grade 1 diastolic dysfunction. Home medications include Lasix metoprolol. X-ray with edema lower extremity edema worsening. He received 80 mg Lasix IV in the emergency department -Continue Lasix 20 mg IV daily -Monitor intake and output -Obtain daily weights -  Continue beta blocker -Obtain 2-D echo  #4. Hypertension. Controlled in the emergency department. I medications include Lasix, metoprolol, Diovan -Lasix as noted above -Continue beta blocker -Hold Diovan -Monitor  #5. A. fib. [place] score 6. On Eliquis. -Continue Eliquis  #6. Diabetes. Serum glucose 238 on admission. He is on oral agents at home -Hold oral agents -Obtain hemoglobin A1c -Use sliding scale insulin for optimal control  7. Chronic kidney disease. Creatinine is 1.59 on admission. Appears to be close to baseline -Monitor    Code Status: full DVT Prophylaxis: Family Communication: wife at bedside Disposition Plan: home when  Time spent: 50 minutes  [facility]

## 2015-09-15 NOTE — Progress Notes (Signed)
Patient arrived in the unit at 6:00 pm accompanied by RN and patient's spouse via stretcher. Orientation given to the room. Patient and spouse verbalizes understanding.

## 2015-09-15 NOTE — ED Notes (Signed)
Lab aware of add on BNP

## 2015-09-15 NOTE — H&P (Cosign Needed)
[facility] History and Physical  SHANTON KIMMEY [id number] DOB: [birth year] DOA: 09/15/2015  Referring physician: edp PCP: [name], MD   Chief Complaint: sob/generalized weakness/cough  HPI: [name] Powell is a very pleasant 82 y.o. male with a past medical history that includes ED status post CABG 2003, A. fib with pacemaker on Eliquis, diabetes, hypertension, HF, Parkinson's presents to emergency Department chief complaint worsening shortness of breath she'll eyes weakness and increased cough. Initial evaluation reveals acute respiratory failure with hypoxia related to communicate they are ammonia and mild CHF exacerbation.  Patient reports generalized malaise over the last 7 days associated with decreased oral intake increased coughing chills and subjective fever. He is not on oxygen at home. He denies chest pain palpitation headache dizziness syncope or near-syncope. He denies abdominal pain nausea vomiting diarrhea constipation melena bright red blood per rectum. He denies dysuria hematuria frequency or urgency. He reports compliance with all his medications and no recent changes. He went to his primary care provider today who referred him to the emergency department  Emergency department he is afebrile hemodynamically stable with hypoxia of 88% on room air at rest. He is provided with DuoNeb's IV antibiotics and IV Lasix in the emergency department   Review of Systems:  10 point review of systems complete and all systems are negative except as indicated in the history of present illness.   Past Medical History  Diagnosis Date  . Diabetes mellitus   . Hypertension   . Pacemaker 12/12/2006    Medtronic adapta  . Coronary artery disease   . 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 CHB (complete heart block) ([place])   . Dyslipidemia   . H/O prostate cancer   . Ventricular tachycardia (paroxysmal) ([place])  03/28/2014  . New onset atrial flutter, persistent 07/04/2014  . Atrial fibrillation ([place])   . Cancer ([place])   . Prostate cancer ([place])   . "Walking corpse" syndrome    Past Surgical History  Procedure Laterality Date  . Pacemaker insertion  12/12/2006    Medtronic adapta  . Coronary artery bypass graft  09/04/2001    LIMA to LAD,SVG to diagonal,SVG to ramus intermedius,SVG to PDA  . Prostate surgery  2001    cancer  . Nm myoview ltd  01/09/2010    no ischemia  . Tonsillectomy     Social History:  reports that he quit smoking about 53 years ago. He does not have any smokeless tobacco history on file. He reports that he drinks alcohol. He reports that he does not use illicit drugs.  He lives at home with his wife has done so for the last 40+ years. 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 Independent with ADLs Allergies  Allergen Reactions  . Mexitil [Mexiletine]     unknown  . Lanoxin [Digoxin] Rash    Eyelid rash    Family History  Problem Relation Age of Onset  . Heart disease Mother    He has 3 siblings whose collective medical history positive for diabetes heart disease.   Prior to Admission medications   Medication Sig Start Date End Date Taking? Authorizing Provider  apixaban (ELIQUIS) 5 MG TABS tablet TAKE 1 TABLET (5 MG TOTAL) BY MOUTH 2 (TWO) TIMES DAILY. 07/15/15  Yes [name], MD  atorvastatin (LIPITOR) 20 MG tablet Take 20 mg by mouth daily.   Yes Historical Provider, MD  benzonatate Lavella Lemons)  100 MG capsule Take 1 capsule by mouth 3 (three) times daily. For 10 days. 07/24/15  Yes Historical Provider, MD  carbidopa-levodopa (SINEMET IR) 25-100 MG tablet Take 1 tablet by mouth 3 (three) times daily before meals. 08/13/15  Yes [name], MD  Cholecalciferol (VITAMIN D PO) Take 5,000 Units by mouth 2 (two) times a week. On Friday   Yes Historical Provider, MD  furosemide (LASIX) 20 MG tablet Take 1 tablet (20 mg total) by mouth daily.  08/25/15  Yes [name], MD  glimepiride (AMARYL) 4 MG tablet Take 4 mg by mouth daily before breakfast.   Yes Historical Provider, MD  JANUVIA 50 MG tablet Take 1 tablet by mouth daily. 05/27/15  Yes Historical Provider, MD  metFORMIN (GLUCOPHAGE-XR) 500 MG 24 hr tablet Take 500 mg by mouth daily. 02/10/15  Yes Historical Provider, MD  metoprolol (LOPRESSOR) 50 MG tablet Take 2 tablets (100mg ) by mouth in the morning and 1 tablet (50mg ) in the evening. 04/21/15  Yes [name], MD  mometasone (ELOCON) 0.1 % cream APPLY TO LEGS AS NEEDED FOR FOOT DERMATITIS 01/10/15  Yes Historical Provider, MD  Multiple Vitamins-Minerals (PRESERVISION AREDS) CAPS Take 1 capsule by mouth 2 (two) times daily.   Yes Historical Provider, MD  polyethylene glycol (MIRALAX / GLYCOLAX) packet Take 17 g by mouth daily. To prevent constipation   Yes Historical Provider, MD  potassium chloride (K-DUR,KLOR-CON) 10 MEQ tablet Take 0.5 tablets (5 mEq total) by mouth daily. 08/05/15  Yes [name], MD  valsartan (DIOVAN) 160 MG tablet TAKE 1 TABLET EVERY DAY 05/26/15  Yes [name], MD   Physical Exam: Filed Vitals:   09/15/15 1615 09/15/15 1630 09/15/15 1700 09/15/15 1715  BP: 125/57 111/43 145/61 146/65  Pulse: 53 56 61 60  Temp:      TempSrc:      Resp: 26 26 17 22   Weight:      SpO2: 96% 99% 97% 97%    Wt Readings from Last 3 Encounters:  09/15/15 86.268 kg (190 lb 3 oz)  08/13/15 87.907 kg (193 lb 12.8 oz)  08/05/15 82.918 kg (182 lb 12.8 oz)    General:  Appears calm and comfortable, slightly flushed  Eyes: PERRL, normal lids, irises & conjunctiva ENT: grossly normal hearing, lips & tongue, his membranes of his mouth are pink but slightly dry  Neck: no LAD, masses or thyromegaly Cardiovascular: irregularly irregular, no m/r/g. trace  LE edema bilaterally.  Respiratory: Rest sounds somewhat diminished. Diffuse rhonchi with end expiratory faint wheeze. Crackles bilateral bases. Normal respiratory  effort somewhat shallow Abdomen: soft, ntnd no guarding or rebounding positive bowel sounds throughout  Skin: no rash or induration seen on limited exam Musculoskeletal: grossly normal tone BUE/BLE, joints without swelling/erythema  Psychiatric: grossly normal mood and affect, speech fluent and appropriate Neurologic: grossly non-focal. speech clear facial symmetry cranial nerves II through XII intact bilateral grip 5 out of 5           Labs on Admission:  Basic Metabolic Panel:  Recent Labs Lab 09/15/15 1243  NA 134*  K 4.4  CL 99*  CO2 24  GLUCOSE 238*  BUN 26*  CREATININE 1.59*  CALCIUM 8.4*   Liver Function Tests:  Recent Labs Lab 09/15/15 1243  AST 21  ALT 6*  ALKPHOS 95  BILITOT 0.9  PROT 7.0  ALBUMIN 3.1*   No results for input(s): LIPASE, AMYLASE in the last 168 hours. No results for input(s): AMMONIA in the last  168 hours. CBC:  Recent Labs Lab 09/15/15 1243  WBC 5.1  NEUTROABS 3.5  HGB 10.2*  HCT 32.3*  MCV 95.8  PLT 195   Cardiac Enzymes: No results for input(s): CKTOTAL, CKMB, CKMBINDEX, TROPONINI in the last 168 hours.  BNP (last 3 results)  Recent Labs  02/20/15 0018 09/15/15 1243  BNP 592.5* 660.5*    ProBNP (last 3 results) No results for input(s): PROBNP in the last 8760 hours.  CBG: No results for input(s): GLUCAP in the last 168 hours.  Radiological Exams on Admission: Dg Chest Portable 1 View  09/15/2015  CLINICAL DATA:  Cough and fever. EXAM: PORTABLE CHEST - 1 VIEW COMPARISON:  Two-view chest x-ray 07/24/2015 FINDINGS: The heart is enlarged. Progressive pulmonary vascular congestion and edema is present. Left lower lobe airspace disease has progressed. A left pleural effusion is present. Interstitial and airspace disease is present at the right base as well. Pacing wires are stable. The patient is status post median sternotomy. Pacing wires are stable. IMPRESSION: 1. Cardiomegaly with increasing interstitial edema suggesting  congestive heart failure. 2. Progressive left lower lobe airspace disease worrisome for aspiration or pneumonia. 3. Progressive left-sided pleural effusion. Electronically Signed   By: San Morelle M.D.   On: 09/15/2015 13:17    EKG: Independently reviewed.AV dual-paced rhythm with occasional Premature ventricular complexes Abnormal ECG  Assessment/Plan Principal Problem:   Acute respiratory failure with hypoxia (HCC) Active Problems:   Pacemaker dependent - Medtronic   CAD s/p CABG 2003   DM2 (diabetes mellitus, type 2) ([place])   HTN (hypertension)   CAP (community acquired pneumonia)   Acute on chronic diastolic (congestive) heart failure ([place])   "Walking corpse" syndrome   Atrial fibrillation ([place])   CKD (chronic kidney disease), stage III   1.    if consultant consulted, please document name and whether formally or informally consulted  Code Status:  (must indicate code status--if unknown or must be presumed, indicate so) DVT Prophylaxis: Family Communication:  (indicate person spoken with, if applicable, with phone number if by telephone) Disposition Plan:  (indicate anticipated LOS)  Time spent:   [facility]

## 2015-09-15 NOTE — ED Provider Notes (Addendum)
Medical screening examination/treatment/procedure(s) were conducted as a shared visit with non-physician practitioner(s) and myself.  I personally evaluated the patient during the encounter.   EKG Interpretation   Date/Time:  Monday Sep 15 2015 12:24:12 EDT Ventricular Rate:  67 PR Interval:  172 QRS Duration: 186 QT Interval:  470 QTC Calculation: 496 R Axis:   -83 Text Interpretation:  AV dual-paced rhythm with occasional Premature  ventricular complexes Abnormal ECG Confirmed by [name] Saulnier  MD, [name] Powell  [phone]) on 09/15/2015 4:54:59 PM      Results for orders placed or performed during the hospital encounter of 09/15/15  Comprehensive metabolic panel  Result Value Ref Range   Sodium 134 (L) 135 - 145 mmol/L   Potassium 4.4 3.5 - 5.1 mmol/L   Chloride 99 (L) 101 - 111 mmol/L   CO2 24 22 - 32 mmol/L   Glucose, Bld 238 (H) 65 - 99 mg/dL   BUN 26 (H) 6 - 20 mg/dL   Creatinine, Ser 1.59 (H) 0.61 - 1.24 mg/dL   Calcium 8.4 (L) 8.9 - 10.3 mg/dL   Total Protein 7.0 6.5 - 8.1 g/dL   Albumin 3.1 (L) 3.5 - 5.0 g/dL   AST 21 15 - 41 U/L   ALT 6 (L) 17 - 63 U/L   Alkaline Phosphatase 95 38 - 126 U/L   Total Bilirubin 0.9 0.3 - 1.2 mg/dL   GFR calc non Af Amer 36 (L) >60 mL/min   GFR calc Af Amer 42 (L) >60 mL/min   Anion gap 11 5 - 15  CBC WITH DIFFERENTIAL  Result Value Ref Range   WBC 5.1 4.0 - 10.5 K/uL   RBC 3.37 (L) 4.22 - 5.81 MIL/uL   Hemoglobin 10.2 (L) 13.0 - 17.0 g/dL   HCT 32.3 (L) 39.0 - 52.0 %   MCV 95.8 78.0 - 100.0 fL   MCH 30.3 26.0 - 34.0 pg   MCHC 31.6 30.0 - 36.0 g/dL   RDW 15.6 (H) 11.5 - 15.5 %   Platelets 195 150 - 400 K/uL   Neutrophils Relative % 67 %   Neutro Abs 3.5 1.7 - 7.7 K/uL   Lymphocytes Relative 16 %   Lymphs Abs 0.8 0.7 - 4.0 K/uL   Monocytes Relative 15 %   Monocytes Absolute 0.8 0.1 - 1.0 K/uL   Eosinophils Relative 2 %   Eosinophils Absolute 0.1 0.0 - 0.7 K/uL   Basophils Relative 0 %   Basophils Absolute 0.0 0.0 - 0.1 K/uL   Urinalysis, Routine w reflex microscopic (not at [facility])  Result Value Ref Range   Color, Urine YELLOW YELLOW   APPearance CLEAR CLEAR   Specific Gravity, Urine 1.017 1.005 - 1.030   pH 6.5 5.0 - 8.0   Glucose, UA NEGATIVE NEGATIVE mg/dL   Hgb urine dipstick SMALL (A) NEGATIVE   Bilirubin Urine NEGATIVE NEGATIVE   Ketones, ur NEGATIVE NEGATIVE mg/dL   Protein, ur 100 (A) NEGATIVE mg/dL   Nitrite NEGATIVE NEGATIVE   Leukocytes, UA NEGATIVE NEGATIVE  Brain natriuretic peptide  Result Value Ref Range   B Natriuretic Peptide 660.5 (H) 0.0 - 100.0 pg/mL  Urine microscopic-add on  Result Value Ref Range   Squamous Epithelial / LPF 0-5 (A) NONE SEEN   WBC, UA NONE SEEN 0 - 5 WBC/hpf   RBC / HPF 6-30 0 - 5 RBC/hpf   Bacteria, UA FEW (A) NONE SEEN  I-Stat CG4 Lactic Acid, ED  (not at  [facility])  Result Value Ref Range  Lactic Acid, Venous 2.35 (HH) 0.5 - 2.0 mmol/L   Comment NOTIFIED PHYSICIAN   I-Stat CG4 Lactic Acid, ED  (not at  [facility])  Result Value Ref Range   Lactic Acid, Venous 2.02 (HH) 0.5 - 2.0 mmol/L   Comment NOTIFIED PHYSICIAN    Ct Head Wo Contrast  08/25/2015   [facility] [address], [place], [place] [address] [phone] NEUROIMAGING REPORT STUDY DATE: 08/25/2015 PATIENT NAME: [name] Powell DOB: May 16, 1922 MRN: [id number] EXAM: CT of the head without contrast ORDERING CLINICIAN: Andrey Powell M.D. CLINICAL HISTORY: 82 year old man with a gait disturbance and Parkinson's disease COMPARISON FILMS: CT scan 10/13/2011 TECHNIQUE: CT scan of the head was obtained utilizing 5 mm axial slices from the skull base to the vertex.   Sagittal and coronal reconstructions were also obtained. CONTRAST: None IMAGING SITE: [facility],  [place] FINDINGS: On the reconstructed sagittal images, the cervicomedullary junction appears normal. The pituitary gland appears normal.     The fourth ventricle is normal in size without distortion. The third and  lateral ventricles are moderately enlarged, in proportion to the extent of moderate cortical atrophy. Atrophy is essentially unchanged when compared to the CT scan dated 10/13/2011.  No extra-axial fluid collections are seen.  No intracranial hemorrhage.  No evidence of mass effect or midline shift.  The cerebellum and brainstem and deep gray matter appears normal. There are hypodense changes in the hemispheres mostly in the deep and periventricular white matter consistent with chronic microvascular ischemic change. The extent is similar to what was observed in 2013. None of the findings appears to be acute.. The orbits and their contents, paranasal sinuses and calvarium are unremarkable.  Compared to the CT scan dated 10/13/2011, the brain appears essentially unchanged. There has been resolution of the scalp hematoma present on the older scan.   08/25/2015   This CT scan of the head without contrast shows the following: 1.   Moderate cortical atrophy, essentially unchanged when compared to the CT scan from 10/13/2011. 2.   Moderate hypodense changes in the white matter of both hemispheres consistent with chronic microvascular ischemic change, essentially unchanged when compared to the 2013 CT. 3.   There are no acute findings. INTERPRETING PHYSICIAN: [name] A. Felecia Shelling, MD, PhD Certified in  Neuroimaging by [place]   Dg Chest Portable 1 View  09/15/2015  CLINICAL DATA:  Cough and fever. EXAM: PORTABLE CHEST - 1 VIEW COMPARISON:  Two-view chest x-ray 07/24/2015 FINDINGS: The heart is enlarged. Progressive pulmonary vascular congestion and edema is present. Left lower lobe airspace disease has progressed. A left pleural effusion is present. Interstitial and airspace disease is present at the right base as well. Pacing wires are stable. The patient is status post median sternotomy. Pacing wires are stable. IMPRESSION: 1. Cardiomegaly with increasing interstitial edema suggesting congestive  heart failure. 2. Progressive left lower lobe airspace disease worrisome for aspiration or pneumonia. 3. Progressive left-sided pleural effusion. Electronically Signed   By: [name] Powell M.D.   On: 09/15/2015 13:17    CRITICAL CARE Performed by: [name] Powell Total critical care time: 30 minutes Critical care time was exclusive of separately billable procedures and treating other patients. Critical care was necessary to treat or prevent imminent or life-threatening deterioration. Critical care was time spent personally by me on the following activities: development of treatment plan with patient and/or surrogate as well as nursing, discussions with consultants, evaluation of patient's response to treatment, examination of patient,  obtaining history from patient or surrogate, ordering and performing treatments and interventions, ordering and review of laboratory studies, ordering and review of radiographic studies, pulse oximetry and re-evaluation of patient's condition.  Patient, with concerns for early sepsis. Treated with sepsis protocol. Chest x-ray showed evidence of pneumonia as well as some pulmonary edema. Patient started on community-acquired pneumonia protocol antibiotics. Patient's lactic acid was elevated repeat lactic acid still above 2 but showing signs of improvement. Patient with no true fever highest temp was 99.1. Patient with hypoxia at one point started on 2 L of oxygen and given a breathing treatment. No further hypoxia. Patient's oxygen sats dropped down to 88%. Patient not normally on oxygen. Blood pressure has been fine. Respiratory rate has been elevated currently it is 26.  Patient also receiving Lasix 80 mg IV for the pulmonary edema CHF. EKG without acute changes troponin is still pending. Discussed with the hospitalist admitting team. Temporary admit orders completed. Patient is also on a blood thinner. Patient had new diagnosis of Parkinson's recently. Patient with  onset of fever on Friday this is probably when the pneumonia develop. He's had an upper respiratory problem for several weeks. Patient without any chest pain. Patient also went backwards in his mental status and his functionality around the time of the fever. Patient's main complaint is been a cough yellow sputum. I feel at the main symptom complex here is the pneumonia and secondary CHF with some respiratory failure. But improved on 2 L of oxygen.  As noted above EKG shows a paced rhythm.  [name] Sorrow, MD 09/15/15 1654  [name] Sorrow, MD 09/15/15 1655

## 2015-09-16 ENCOUNTER — Observation Stay (HOSPITAL_BASED_OUTPATIENT_CLINIC_OR_DEPARTMENT_OTHER): Payer: Commercial Managed Care - HMO

## 2015-09-16 DIAGNOSIS — R0600 Dyspnea, unspecified: Secondary | ICD-10-CM

## 2015-09-16 DIAGNOSIS — I481 Persistent atrial fibrillation: Secondary | ICD-10-CM | POA: Diagnosis present

## 2015-09-16 DIAGNOSIS — I2510 Atherosclerotic heart disease of native coronary artery without angina pectoris: Secondary | ICD-10-CM | POA: Diagnosis present

## 2015-09-16 DIAGNOSIS — E1122 Type 2 diabetes mellitus with diabetic chronic kidney disease: Secondary | ICD-10-CM | POA: Diagnosis present

## 2015-09-16 DIAGNOSIS — G20 Parkinson's disease: Secondary | ICD-10-CM | POA: Diagnosis present

## 2015-09-16 DIAGNOSIS — N183 Chronic kidney disease, stage 3 (moderate): Secondary | ICD-10-CM | POA: Diagnosis present

## 2015-09-16 DIAGNOSIS — Z7901 Long term (current) use of anticoagulants: Secondary | ICD-10-CM | POA: Diagnosis not present

## 2015-09-16 DIAGNOSIS — D649 Anemia, unspecified: Secondary | ICD-10-CM | POA: Diagnosis present

## 2015-09-16 DIAGNOSIS — Z8546 Personal history of malignant neoplasm of prostate: Secondary | ICD-10-CM | POA: Diagnosis not present

## 2015-09-16 DIAGNOSIS — Z87891 Personal history of nicotine dependence: Secondary | ICD-10-CM | POA: Diagnosis not present

## 2015-09-16 DIAGNOSIS — Z950 Presence of cardiac pacemaker: Secondary | ICD-10-CM | POA: Diagnosis not present

## 2015-09-16 DIAGNOSIS — Z951 Presence of aortocoronary bypass graft: Secondary | ICD-10-CM | POA: Diagnosis not present

## 2015-09-16 DIAGNOSIS — J9601 Acute respiratory failure with hypoxia: Secondary | ICD-10-CM | POA: Diagnosis present

## 2015-09-16 DIAGNOSIS — I4891 Unspecified atrial fibrillation: Secondary | ICD-10-CM | POA: Diagnosis present

## 2015-09-16 DIAGNOSIS — I130 Hypertensive heart and chronic kidney disease with heart failure and stage 1 through stage 4 chronic kidney disease, or unspecified chronic kidney disease: Secondary | ICD-10-CM | POA: Diagnosis present

## 2015-09-16 DIAGNOSIS — I5033 Acute on chronic diastolic (congestive) heart failure: Secondary | ICD-10-CM | POA: Diagnosis present

## 2015-09-16 DIAGNOSIS — Z7984 Long term (current) use of oral hypoglycemic drugs: Secondary | ICD-10-CM | POA: Diagnosis not present

## 2015-09-16 DIAGNOSIS — E785 Hyperlipidemia, unspecified: Secondary | ICD-10-CM | POA: Diagnosis present

## 2015-09-16 DIAGNOSIS — J189 Pneumonia, unspecified organism: Secondary | ICD-10-CM | POA: Diagnosis present

## 2015-09-16 DIAGNOSIS — F22 Delusional disorders: Secondary | ICD-10-CM | POA: Diagnosis present

## 2015-09-16 LAB — HEMOGLOBIN A1C
Hgb A1c MFr Bld: 8.6 % — ABNORMAL HIGH (ref 4.8–5.6)
Mean Plasma Glucose: 200 mg/dL

## 2015-09-16 LAB — BASIC METABOLIC PANEL
Anion gap: 12 (ref 5–15)
BUN: 24 mg/dL — AB (ref 6–20)
CO2: 28 mmol/L (ref 22–32)
Calcium: 7.9 mg/dL — ABNORMAL LOW (ref 8.9–10.3)
Chloride: 98 mmol/L — ABNORMAL LOW (ref 101–111)
Creatinine, Ser: 1.46 mg/dL — ABNORMAL HIGH (ref 0.61–1.24)
GFR calc Af Amer: 47 mL/min — ABNORMAL LOW (ref >60)
GFR, EST NON AFRICAN AMERICAN: 40 mL/min — AB (ref >60)
GLUCOSE: 78 mg/dL (ref 65–99)
POTASSIUM: 3.7 mmol/L (ref 3.5–5.1)
Sodium: 138 mmol/L (ref 135–145)

## 2015-09-16 LAB — STREP PNEUMONIAE URINARY ANTIGEN: STREP PNEUMO URINARY ANTIGEN: NEGATIVE

## 2015-09-16 LAB — CBC
HEMATOCRIT: 29.1 % — AB (ref 39.0–52.0)
Hemoglobin: 9.2 g/dL — ABNORMAL LOW (ref 13.0–17.0)
MCH: 30.8 pg (ref 26.0–34.0)
MCHC: 31.6 g/dL (ref 30.0–36.0)
MCV: 97.3 fL (ref 78.0–100.0)
Platelets: 180 10*3/uL (ref 150–400)
RBC: 2.99 MIL/uL — ABNORMAL LOW (ref 4.22–5.81)
RDW: 15.7 % — AB (ref 11.5–15.5)
WBC: 5.9 10*3/uL (ref 4.0–10.5)

## 2015-09-16 LAB — GLUCOSE, CAPILLARY
GLUCOSE-CAPILLARY: 72 mg/dL (ref 65–99)
GLUCOSE-CAPILLARY: 175 mg/dL — AB (ref 65–99)
Glucose-Capillary: 204 mg/dL — ABNORMAL HIGH (ref 65–99)
Glucose-Capillary: 172 mg/dL — ABNORMAL HIGH (ref 65–99)

## 2015-09-16 LAB — ECHOCARDIOGRAM COMPLETE
HEIGHTINCHES: 68 in
Weight: 3004.8 oz

## 2015-09-16 MED ORDER — POTASSIUM CHLORIDE 20 MEQ/15ML (10%) PO SOLN
5.0000 meq | Freq: Every day | ORAL | Status: DC
  Administered 2015-09-16 – 2015-09-18 (×3): 5.0667 meq via ORAL
  Filled 2015-09-16 (×3): qty 15

## 2015-09-16 MED ORDER — LORAZEPAM 2 MG/ML IJ SOLN
0.5000 mg | Freq: Once | INTRAMUSCULAR | Status: AC
  Administered 2015-09-16: 0.5 mg via INTRAVENOUS
  Filled 2015-09-16: qty 1

## 2015-09-16 NOTE — Progress Notes (Signed)
TRH Progress Note                                            Patient Demographics:    [name] Powell, is a 82 y.o. male, DOB - [birth year], [id number]  Admit date - 09/15/2015   Admitting Physician Waldemar Dickens, MD  Outpatient Primary MD for the patient is [name], MD  LOS -   Outpatient Specialists:  Chief Complaint  Patient presents with  . Shortness of Breath  . Cough        Subjective:    [name] Powell Dyspnea has been improving, had echo this am, no chest pain, no palpitations, out of bed to the chair.   Assessment  & Plan :    Principal Problem:   Acute respiratory failure with hypoxia (HCC) Active Problems:   Pacemaker dependent - [employer]   CAD s/p CABG 2003   DM2 (diabetes mellitus, type 2) ([place])   HTN (hypertension)   CAP (community acquired pneumonia)   Acute on chronic diastolic (congestive) heart failure ([place])   "Walking corpse" syndrome   Atrial fibrillation (HCC)   CKD (chronic kidney disease), stage III   1. Cardiovascular. Patient hemodynamic stable, will continue to monitor blood pressure. Clinically euvolemic, will continue atorvastatin, metoprolol, furosemide and anticoagulation with apixaban.  2. Pulmonary. Persistent dyspnea, patient on home 02, will continue to monitor oxymetry and will need portable 02 at discharge, continue anibiotic therapy and bronchodilator therapy.  3, Nephrology. Renal function stable follow renal panel in am, avoid hypotension or nephrotoxic meds.  CR at 1.46.  4. Endocrinology,. Will continue glucose cover with insulin aspart.  5. Neurology. Will continue neuro checks, continue carbidopa  and levodopa.    Code Status :   Family Communication  :   Disposition Plan  :   Barriers For Discharge :   Consults  :    Procedures  :   DVT Prophylaxis  :  apixaban  Lab Results  Component Value Date   PLT 180 09/16/2015    Antibiotics  :   Anti-infectives    Start     Dose/Rate Route Frequency Ordered Stop   09/16/15 1500  azithromycin (ZITHROMAX) 500 mg in dextrose 5 % 250 mL IVPB     500 mg 250 mL/hr over 60 Minutes Intravenous Every 24 hours 09/15/15 1352     09/16/15 1400  cefTRIAXone (ROCEPHIN) 1 g in dextrose 5 % 50 mL IVPB     1 g 100 mL/hr over 30 Minutes Intravenous Every 24 hours 09/15/15 1352     09/15/15 1345  cefTRIAXone (ROCEPHIN) 1 g in dextrose 5 % 50 mL IVPB     1 g 100 mL/hr  over 30 Minutes Intravenous  Once 09/15/15 1338 09/15/15 1427   09/15/15 1345  azithromycin (ZITHROMAX) 500 mg in dextrose 5 % 250 mL IVPB     500 mg 250 mL/hr over 60 Minutes Intravenous  Once 09/15/15 1338 09/15/15 1538        Objective:   Filed Vitals:   09/16/15 0746 09/16/15 0804 09/16/15 1207 09/16/15 1400  BP:  129/58 111/49   Pulse:  79 60   Temp:  98.4 F (36.9 C) 97.3 F (36.3 C)   TempSrc:  Oral Oral   Resp:  16 18   Height:      Weight:  85.186 kg (187 lb 12.8 oz)    SpO2: 91% 95% 94% 88%    Wt Readings from Last 3 Encounters:  09/16/15 85.186 kg (187 lb 12.8 oz)  08/13/15 87.907 kg (193 lb 12.8 oz)  08/05/15 82.918 kg (182 lb 12.8 oz)     Intake/Output Summary (Last 24 hours) at 09/16/15 1420 Last data filed at 09/16/15 0921  Gross per 24 hour  Intake    240 ml  Output   1380 ml  Net  -1140 ml     Physical Exam  Awake Alert, Oriented X 3, No new F.N deficits, Normal affect , ill looking appearing. [place].AT,PERRAL Supple Neck,No JVD, No cervical lymphadenopathy appriciated.  Symmetrical Chest wall movement, Decreased inspiratory effort, with rales at bases., CTAB RRR,No Gallops,Rubs or new Murmurs, No Parasternal Heave +ve B.Sounds,  Abd Soft, No tenderness, No organomegaly appriciated, No rebound - guarding or rigidity. No Cyanosis, Clubbing. Positive  Edema ++ , No new Rash or bruise      Data Review:    CBC  Recent Labs Lab 09/15/15 1243 09/16/15 0514  WBC 5.1 5.9  HGB 10.2* 9.2*  HCT 32.3* 29.1*  PLT 195 180  MCV 95.8 97.3  MCH 30.3 30.8  MCHC 31.6 31.6  RDW 15.6* 15.7*  LYMPHSABS 0.8  --   MONOABS 0.8  --   EOSABS 0.1  --   BASOSABS 0.0  --     Chemistries   Recent Labs Lab 09/15/15 1243 09/16/15 0514  NA 134* 138  K 4.4 3.7  CL 99* 98*  CO2 24 28  GLUCOSE 238* 78  BUN 26* 24*  CREATININE 1.59* 1.46*  CALCIUM 8.4* 7.9*  AST 21  --   ALT 6*  --   ALKPHOS 95  --   BILITOT 0.9  --    ------------------------------------------------------------------------------------------------------------------ No results for input(s): CHOL, HDL, LDLCALC, TRIG, CHOLHDL, LDLDIRECT in the last 72 hours.  Lab Results  Component Value Date   HGBA1C 8.6* 09/15/2015   ------------------------------------------------------------------------------------------------------------------ No results for input(s): TSH, T4TOTAL, T3FREE, THYROIDAB in the last 72 hours.  Invalid input(s): FREET3 ------------------------------------------------------------------------------------------------------------------ No results for input(s): VITAMINB12, FOLATE, FERRITIN, TIBC, IRON, RETICCTPCT in the last 72 hours.  Coagulation profile No results for input(s): INR, PROTIME in the last 168 hours.  No results for input(s): DDIMER in the last 72 hours.  Cardiac Enzymes No results for input(s): CKMB, TROPONINI, MYOGLOBIN in the last 168 hours.  Invalid input(s): CK ------------------------------------------------------------------------------------------------------------------    Component Value Date/Time   BNP 660.5* 09/15/2015 1243    Inpatient Medications  Scheduled Meds: . apixaban  2.5 mg Oral BID  .  atorvastatin  20 mg Oral Daily  . azithromycin  500 mg Intravenous Q24H  . benzonatate  100 mg Oral TID  . carbidopa-levodopa  1 tablet Oral TID AC  . cefTRIAXone (ROCEPHIN)  IV  1 g Intravenous Q24H  . furosemide  20 mg Intravenous Daily  . insulin aspart  0-5 Units Subcutaneous QHS  . insulin aspart  0-9 Units Subcutaneous TID WC  . ipratropium-albuterol  3 mL Nebulization Q6H  . metoprolol tartrate  100 mg Oral Daily  . metoprolol tartrate  50 mg Oral Q2200  . polyethylene glycol  17 g Oral Daily  . potassium chloride  5 mEq Oral Daily  . sodium chloride flush  3 mL Intravenous Q12H   Continuous Infusions:  PRN Meds:.sodium chloride, acetaminophen **OR** acetaminophen, benzonatate, HYDROcodone-acetaminophen, morphine injection, ondansetron **OR** ondansetron (ZOFRAN) IV, senna-docusate, sodium chloride flush, traZODone  Micro Results Recent Results (from the past 240 hour(s))  Urine culture     Status: None (Preliminary result)   Collection Time: 09/15/15  2:16 PM  Result Value Ref Range Status   Specimen Description URINE, CLEAN CATCH  Final   Special Requests NONE  Final   Culture TOO YOUNG TO READ  Final   Report Status PENDING  Incomplete    Radiology Reports Ct Head [facility] Contrast  08/25/2015   [facility] [address] [place], [place] [address] [phone] NEUROIMAGING REPORT STUDY DATE: 08/25/2015 PATIENT NAME: OXLEY SARNI DOB: 1922/10/23 MRN: [id number] EXAM: CT of the head without contrast ORDERING CLINICIAN: Andrey Spearman M.D. CLINICAL HISTORY: 82 year old man with a gait disturbance and Parkinson's disease COMPARISON FILMS: CT scan 10/13/2011 TECHNIQUE: CT scan of the head was obtained utilizing 5 mm axial slices from the skull base to the vertex.   Sagittal and coronal reconstructions were also obtained. CONTRAST: None IMAGING SITE: [facility],  [place] FINDINGS: On the reconstructed sagittal images, the cervicomedullary  junction appears normal. The pituitary gland appears normal.     The fourth ventricle is normal in size without distortion. The third and lateral ventricles are moderately enlarged, in proportion to the extent of moderate cortical atrophy. Atrophy is essentially unchanged when compared to the CT scan dated 10/13/2011.  No extra-axial fluid collections are seen.  No intracranial hemorrhage.  No evidence of mass effect or midline shift.  The cerebellum and brainstem and deep gray matter appears normal. There are hypodense changes in the hemispheres mostly in the deep and periventricular white matter consistent with chronic microvascular ischemic change. The extent is similar to what was observed in 2013. None of the findings appears to be acute.. The orbits and their contents, paranasal sinuses and calvarium are unremarkable.  Compared to the CT scan dated 10/13/2011, the brain appears essentially unchanged. There has been resolution of the scalp hematoma present on the older scan.   08/25/2015   This CT scan of the head without contrast shows the following: 1.   Moderate cortical atrophy, essentially unchanged when compared to the CT scan from 10/13/2011. 2.   Moderate hypodense changes in the white matter of both hemispheres consistent with chronic microvascular ischemic change, essentially unchanged when compared to the 2013 CT. 3.   There are no acute findings. INTERPRETING PHYSICIAN: Richard A. Felecia Shelling, MD, PhD Certified in  Neuroimaging by [place] [employer]   Dg Chest Portable 1 View  09/15/2015  CLINICAL DATA:  Cough and fever. EXAM: PORTABLE CHEST - 1 VIEW COMPARISON:  Two-view chest x-ray 07/24/2015 FINDINGS: The heart is enlarged. Progressive pulmonary vascular congestion and edema is present. Left lower lobe airspace disease has progressed. A left pleural effusion is present. Interstitial and airspace disease is present at the right base as  well. Pacing wires are stable. The patient is  status post median sternotomy. Pacing wires are stable. IMPRESSION: 1. Cardiomegaly with increasing interstitial edema suggesting congestive heart failure. 2. Progressive left lower lobe airspace disease worrisome for aspiration or pneumonia. 3. Progressive left-sided pleural effusion. Electronically Signed   By: San Morelle M.D.   On: 09/15/2015 13:17    Time Spent in minutes     Tawni Millers M.D on 09/16/2015 at 2:20 PM  Between 7am to 7pm - Pager   After 7pm go to [email or web address] - password [facility]  [facility] -  Office  [phone]

## 2015-09-16 NOTE — Progress Notes (Signed)
*  PRELIMINARY RESULTS* Echocardiogram 2D Echocardiogram has been performed.  Leavy Cella 09/16/2015, 11:09 AM

## 2015-09-16 NOTE — Progress Notes (Signed)
Pt confused overnight, agitated and trying to get out of bed. Pt wife requesting medicine to help him relax. Pt disoriented to place, time and situation. Per wife, pt is not normally confused. K Kirby paged and new order for one time dose of ativan.

## 2015-09-17 LAB — BASIC METABOLIC PANEL
ANION GAP: 12 (ref 5–15)
BUN: 31 mg/dL — ABNORMAL HIGH (ref 6–20)
CO2: 26 mmol/L (ref 22–32)
Calcium: 7.9 mg/dL — ABNORMAL LOW (ref 8.9–10.3)
Chloride: 102 mmol/L (ref 101–111)
Creatinine, Ser: 1.43 mg/dL — ABNORMAL HIGH (ref 0.61–1.24)
GFR calc Af Amer: 48 mL/min — ABNORMAL LOW (ref >60)
GFR calc non Af Amer: 41 mL/min — ABNORMAL LOW (ref >60)
GLUCOSE: 126 mg/dL — AB (ref 65–99)
POTASSIUM: 4 mmol/L (ref 3.5–5.1)
Sodium: 140 mmol/L (ref 135–145)

## 2015-09-17 LAB — CBC WITH DIFFERENTIAL/PLATELET
BASOS ABS: 0.1 10*3/uL (ref 0.0–0.1)
Basophils Relative: 1 %
EOS ABS: 0.4 10*3/uL (ref 0.0–0.7)
Eosinophils Relative: 5 %
HCT: 28.6 % — ABNORMAL LOW (ref 39.0–52.0)
HEMOGLOBIN: 8.9 g/dL — AB (ref 13.0–17.0)
LYMPHS PCT: 28 %
Lymphs Abs: 2 10*3/uL (ref 0.7–4.0)
MCH: 29.9 pg (ref 26.0–34.0)
MCHC: 31.1 g/dL (ref 30.0–36.0)
MCV: 96 fL (ref 78.0–100.0)
MONO ABS: 1.5 10*3/uL — AB (ref 0.1–1.0)
Monocytes Relative: 21 %
NEUTROS ABS: 3 10*3/uL (ref 1.7–7.7)
NEUTROS PCT: 45 %
PLATELETS: 186 10*3/uL (ref 150–400)
RBC: 2.98 MIL/uL — ABNORMAL LOW (ref 4.22–5.81)
RDW: 15.8 % — ABNORMAL HIGH (ref 11.5–15.5)
WBC: 7 10*3/uL (ref 4.0–10.5)

## 2015-09-17 LAB — GLUCOSE, CAPILLARY
GLUCOSE-CAPILLARY: 203 mg/dL — AB (ref 65–99)
GLUCOSE-CAPILLARY: 175 mg/dL — AB (ref 65–99)
Glucose-Capillary: 125 mg/dL — ABNORMAL HIGH (ref 65–99)
Glucose-Capillary: 278 mg/dL — ABNORMAL HIGH (ref 65–99)

## 2015-09-17 LAB — URINE CULTURE

## 2015-09-17 NOTE — Progress Notes (Signed)
09/17/15  1145  Paged Dr Cathlean Sauer for orders for PT/OT per patients request to walk.

## 2015-09-17 NOTE — Progress Notes (Signed)
PROGRESS NOTE    [name] Powell  [id number] DOB: March 02, 1923 DOA: 09/15/2015 PCP: [name], MD (Confirm with patient/family/NH records and if not entered, this HAS to be entered at [facility] point of entry. "No PCP" if truly none.) Outpatient Specialists: Theme park manager speciality and name if known)    Brief Narrative: (Start on day 1 of progress note - keep it brief and live)    Assessment & Plan:  1. Cardiovascular. Patient tolerating well diuresis, blood pressure systolic 99991111 to [id number], will continue atorvastatin, metoprolol, furosemide and anticoagulation with apixaban. Negative fluid balance 1030 cc over last 24 hours. Will continue antibiotic therapy with ceftriaxone plan for 8 days.  2. Pulmonary.Will continue to monitor oxymetry and supplemental 02 per [place], target 02 sat above 92%, will continue diuresis to target negative fluid balance.   3, Nephrology. Cr stable at 1.43. Na at 140, will continue furosemide IV, noted negative fluid balance, will follow on renal function in am.  4. Endocrinology,. Will continue glucose cover with insulin aspart.  5. Neurology. Will continue neuro checks, continue carbidopa and levodopa.      Principal Problem:   Acute respiratory failure with hypoxia (HCC) Active Problems:   Pacemaker dependent - [employer]   CAD s/p CABG 2003   DM2 (diabetes mellitus, type 2) (HCC)   HTN (hypertension)   CAP (community acquired pneumonia)   Acute on chronic diastolic (congestive) heart failure (HCC)   "Walking corpse" syndrome   Atrial fibrillation (HCC)   CKD (chronic kidney disease), stage III      DVT prophylaxis: (Lovenox/Heparin/SCD's/anticoagulated/None (if comfort care) Code Status: (Full/Partial - specify details) Family Communication: (Specify name, relationship & date discussed. NO "discussed with patient") Disposition Plan: (specify when and where you expect patient to be discharged)   Consultants:     Procedures: (Don't  include imaging studies which can be auto populated. Include things that cannot be auto populated i.e. Echo, Carotid and venous dopplers, Foley, Bipap, HD, tubes/drains, wound vac, central lines etc)    Antimicrobials: (specify start and planned stop date. Auto populated tables are space occupying and do not give end dates)     Subjective: Patient with persistent dyspnea, improved but still symptomatic, positive weakness and deconditioning, no nausea or vomiting.   Objective: Filed Vitals:   09/17/15 0852 09/17/15 0919 09/17/15 1207 09/17/15 1449  BP:  114/42 118/51   Pulse:  60 59   Temp:   97.7 F (36.5 C)   TempSrc:   Oral   Resp:  16 18   Height:      Weight:      SpO2: 90% 94% 94% 99%    Intake/Output Summary (Last 24 hours) at 09/17/15 1753 Last data filed at 09/17/15 1421  Gross per 24 hour  Intake    770 ml  Output    700 ml  Net     70 ml   Filed Weights   09/15/15 1811 09/16/15 0804 09/17/15 0438  Weight: 87.317 kg (192 lb 8 oz) 85.186 kg (187 lb 12.8 oz) 85.3 kg (188 lb 0.8 oz)    Examination:  General exam: Appears calm and comfortable  Respiratory system: Clear to auscultation. Respiratory effort normal. Decrease inspiratory effort with bibasilar rales. Cardiovascular system: S1 & S2 heard, RRR. No JVD, murmurs, rubs, gallops or clicks. No pedal edema. Gastrointestinal system: Abdomen is nondistended, soft and nontender. No organomegaly or masses felt. Normal bowel sounds heard. Central nervous system: Alert and oriented. No focal neurological deficits. Extremities: Symmetric 5  x 5 power. Trace edema bilatereally Skin: No rashes, lesions or ulcers Psychiatry: Judgement and insight appear normal. Mood & affect appropriate.     Data Reviewed: I have personally reviewed following labs and imaging studies  CBC:  Recent Labs Lab 09/15/15 1243 09/16/15 0514 09/17/15 0334  WBC 5.1 5.9 7.0  NEUTROABS 3.5  --  3.0  HGB 10.2* 9.2* 8.9*  HCT 32.3*  29.1* 28.6*  MCV 95.8 97.3 96.0  PLT 195 180 99991111   Basic Metabolic Panel:  Recent Labs Lab 09/15/15 1243 09/16/15 0514 09/17/15 0334  NA 134* 138 140  K 4.4 3.7 4.0  CL 99* 98* 102  CO2 24 28 26   GLUCOSE 238* 78 126*  BUN 26* 24* 31*  CREATININE 1.59* 1.46* 1.43*  CALCIUM 8.4* 7.9* 7.9*   GFR: Estimated Creatinine Clearance: 35.8 mL/min (by C-G formula based on Cr of 1.43). Liver Function Tests:  Recent Labs Lab 09/15/15 1243  AST 21  ALT 6*  ALKPHOS 95  BILITOT 0.9  PROT 7.0  ALBUMIN 3.1*   No results for input(s): LIPASE, AMYLASE in the last 168 hours. No results for input(s): AMMONIA in the last 168 hours. Coagulation Profile: No results for input(s): INR, PROTIME in the last 168 hours. Cardiac Enzymes: No results for input(s): CKTOTAL, CKMB, CKMBINDEX, TROPONINI in the last 168 hours. BNP (last 3 results) No results for input(s): PROBNP in the last 8760 hours. HbA1C:  Recent Labs  09/15/15 1914  HGBA1C 8.6*   CBG:  Recent Labs Lab 09/16/15 1641 09/16/15 2127 09/17/15 0633 09/17/15 1124 09/17/15 1619  GLUCAP 204* 172* 125* 278* 203*   Lipid Profile: No results for input(s): CHOL, HDL, LDLCALC, TRIG, CHOLHDL, LDLDIRECT in the last 72 hours. Thyroid Function Tests: No results for input(s): TSH, T4TOTAL, FREET4, T3FREE, THYROIDAB in the last 72 hours. Anemia Panel: No results for input(s): VITAMINB12, FOLATE, FERRITIN, TIBC, IRON, RETICCTPCT in the last 72 hours. Urine analysis:    Component Value Date/Time   COLORURINE YELLOW 09/15/2015 [place] 09/15/2015 1416   LABSPEC 1.017 09/15/2015 1416   PHURINE 6.5 09/15/2015 1416   GLUCOSEU NEGATIVE 09/15/2015 1416   HGBUR SMALL* 09/15/2015 1416   [place] 09/15/2015 1416   KETONESUR NEGATIVE 09/15/2015 1416   PROTEINUR 100* 09/15/2015 1416   UROBILINOGEN 1.0 02/19/2015 2354   NITRITE NEGATIVE 09/15/2015 1416   LEUKOCYTESUR NEGATIVE 09/15/2015 1416   Sepsis  Labs:  Recent Labs Lab 09/15/15 1254 09/15/15 1535  LATICACIDVEN 2.35* 2.02*    Recent Results (from the past 240 hour(s))  Blood Culture (routine x 2)     Status: None (Preliminary result)   Collection Time: 09/15/15 12:45 PM  Result Value Ref Range Status   Specimen Description BLOOD RIGHT ANTECUBITAL  Final   Special Requests BOTTLES DRAWN AEROBIC AND ANAEROBIC 5CC  Final   Culture NO GROWTH 2 DAYS  Final   Report Status PENDING  Incomplete  Blood Culture (routine x 2)     Status: None (Preliminary result)   Collection Time: 09/15/15  1:10 PM  Result Value Ref Range Status   Specimen Description RIGHT ANTECUBITAL  Final   Special Requests BOTTLES DRAWN AEROBIC AND ANAEROBIC 5ML  Final   Culture NO GROWTH 2 DAYS  Final   Report Status PENDING  Incomplete  Urine culture     Status: Abnormal   Collection Time: 09/15/15  2:16 PM  Result Value Ref Range Status   Specimen Description URINE, CLEAN CATCH  Final  Special Requests NONE  Final   Culture MULTIPLE SPECIES PRESENT, SUGGEST RECOLLECTION (A)  Final   Report Status 09/17/2015 FINAL  Final         Radiology Studies: No results found.      Scheduled Meds: . apixaban  2.5 mg Oral BID  . atorvastatin  20 mg Oral Daily  . azithromycin  500 mg Intravenous Q24H  . benzonatate  100 mg Oral TID  . carbidopa-levodopa  1 tablet Oral TID AC  . cefTRIAXone (ROCEPHIN)  IV  1 g Intravenous Q24H  . furosemide  20 mg Intravenous Daily  . insulin aspart  0-5 Units Subcutaneous QHS  . insulin aspart  0-9 Units Subcutaneous TID WC  . ipratropium-albuterol  3 mL Nebulization Q6H  . metoprolol tartrate  100 mg Oral Daily  . metoprolol tartrate  50 mg Oral Q2200  . polyethylene glycol  17 g Oral Daily  . potassium chloride  5.0667 mEq Oral Daily  . sodium chloride flush  3 mL Intravenous Q12H   Continuous Infusions:    LOS: 1 day    Time spent   Tawni Millers, MD [facility] Pager [phone]  If  7PM-7AM, please contact night-coverage [email or web address] Password [name] 09/17/2015, 5:53 PM

## 2015-09-17 NOTE — Clinical Documentation Improvement (Signed)
Internal Medicine  Can diagnosis be associate with pt. Receiving Ativan  be further specified?   Encephalopathy - Alcoholic, Anoxic/Hypoxia, Drug Induced/Toxic (specify drug), Hepatic, Hypertensive, Hypoglycemic, Metabolic/Septic, Traumatic/post concussive, Wernicke, Other   Dementia with behavior disturbances  Other  Clinically Undetermined  Document any associated diagnoses/conditions. Supporting Information: 09/16/2015 Nursing notes:  Pt confused overnight, agitated and trying to get out of bed. Pt wife requesting medicine to help him relax. Pt disoriented to place, time and situation. Per wife, pt is not normally confused. K Kirby paged and new order for one time dose of ativan.   Please exercise your independent, professional judgment when responding. A specific answer is not anticipated or expected. Please update your documentation within the medical record to reflect your response to this query. Thank you  Thank You,  [place] [phone]

## 2015-09-18 ENCOUNTER — Other Ambulatory Visit: Payer: Self-pay

## 2015-09-18 ENCOUNTER — Inpatient Hospital Stay (HOSPITAL_COMMUNITY): Payer: Commercial Managed Care - HMO

## 2015-09-18 LAB — CBC WITH DIFFERENTIAL/PLATELET
BASOS ABS: 0 10*3/uL (ref 0.0–0.1)
BASOS PCT: 0 %
Eosinophils Absolute: 0.4 10*3/uL (ref 0.0–0.7)
Eosinophils Relative: 5 %
HEMATOCRIT: 27.7 % — AB (ref 39.0–52.0)
Hemoglobin: 8.5 g/dL — ABNORMAL LOW (ref 13.0–17.0)
LYMPHS ABS: 1.6 10*3/uL (ref 0.7–4.0)
Lymphocytes Relative: 21 %
MCH: 29.4 pg (ref 26.0–34.0)
MCHC: 30.7 g/dL (ref 30.0–36.0)
MCV: 95.8 fL (ref 78.0–100.0)
Monocytes Absolute: 1.2 10*3/uL — ABNORMAL HIGH (ref 0.1–1.0)
Monocytes Relative: 16 %
NEUTROS ABS: 4.2 10*3/uL (ref 1.7–7.7)
Neutrophils Relative %: 58 %
Platelets: 200 10*3/uL (ref 150–400)
RBC: 2.89 MIL/uL — ABNORMAL LOW (ref 4.22–5.81)
RDW: 15.7 % — ABNORMAL HIGH (ref 11.5–15.5)
WBC: 7.4 10*3/uL (ref 4.0–10.5)

## 2015-09-18 LAB — BASIC METABOLIC PANEL
Anion gap: 10 (ref 5–15)
BUN: 27 mg/dL — AB (ref 6–20)
CHLORIDE: 102 mmol/L (ref 101–111)
CO2: 27 mmol/L (ref 22–32)
CREATININE: 1.4 mg/dL — AB (ref 0.61–1.24)
Calcium: 8 mg/dL — ABNORMAL LOW (ref 8.9–10.3)
GFR calc Af Amer: 49 mL/min — ABNORMAL LOW (ref >60)
GFR calc non Af Amer: 42 mL/min — ABNORMAL LOW (ref >60)
Glucose, Bld: 118 mg/dL — ABNORMAL HIGH (ref 65–99)
Potassium: 4.2 mmol/L (ref 3.5–5.1)
SODIUM: 139 mmol/L (ref 135–145)

## 2015-09-18 LAB — GLUCOSE, CAPILLARY
GLUCOSE-CAPILLARY: 120 mg/dL — AB (ref 65–99)
GLUCOSE-CAPILLARY: 149 mg/dL — AB (ref 65–99)
Glucose-Capillary: 282 mg/dL — ABNORMAL HIGH (ref 65–99)

## 2015-09-18 MED ORDER — FUROSEMIDE 20 MG PO TABS
20.0000 mg | ORAL_TABLET | Freq: Every day | ORAL | Status: DC
  Administered 2015-09-18: 20 mg via ORAL
  Filled 2015-09-18: qty 1

## 2015-09-18 MED ORDER — APIXABAN 5 MG PO TABS: 5.0000 mg | ORAL_TABLET | Freq: Two times a day (BID) | ORAL | Status: DC

## 2015-09-18 MED ORDER — ALBUTEROL SULFATE (2.5 MG/3ML) 0.083% IN NEBU: 2.5000 mg | INHALATION_SOLUTION | RESPIRATORY_TRACT | Status: DC | PRN

## 2015-09-18 MED ORDER — AMOXICILLIN-POT CLAVULANATE 500-125 MG PO TABS: 1.0000 | ORAL_TABLET | Freq: Two times a day (BID) | ORAL | Status: DC

## 2015-09-18 MED ORDER — IPRATROPIUM-ALBUTEROL 0.5-2.5 (3) MG/3ML IN SOLN
3.0000 mL | Freq: Three times a day (TID) | RESPIRATORY_TRACT | Status: DC
Start: 2015-09-18 — End: 2015-09-18
  Administered 2015-09-18 (×2): 3 mL via RESPIRATORY_TRACT
  Filled 2015-09-18 (×2): qty 3

## 2015-09-18 NOTE — Discharge Instructions (Signed)

## 2015-09-18 NOTE — Progress Notes (Signed)
SATURATION QUALIFICATIONS: (This note is used to comply with regulatory documentation for home oxygen)  Patient Saturations on Room Air at Rest = 92%  Patient Saturations on Room Air while Ambulating = 85%  Patient Saturations on 2 Liters of oxygen while Ambulating = 96%  Please briefly explain why patient needs home oxygen: O2 sats below 90% while ambulating on RA.

## 2015-09-18 NOTE — Consult Note (Signed)
   [facility] CM Inpatient Consult   09/18/2015  [name] Powell 06/01/1922 [id number]   Patient evaluated for community based chronic disease management services with [place] [facility] as a benefit of patient's [employer] Medicare Insurance/[facility] ACO Registry.  Patient admitted with AF, HX of HF, COPD.   Met with the patient and his wife,Betty,  to explain [place] [facility] services.  Wife express that the patient will have [place] with some physical therapy.  Explained the differences between home health and care management.  Wife states, "I think the help will be good for [place]."  Consent form signed.   Patient will receive post hospital discharge call and will be evaluated for monthly home visits for assessments and disease process education.  Left contact information and [facility] literature with wife at bedside. Made Inpatient Case Manager aware that [place] Management following. Of note, [facility] Care Management services does not replace or interfere with any services that are arranged by inpatient case management or social work.  For additional questions or referrals please contact:    Natividad Brood, RN BSN [facility] Liaison  [phone] business mobile phone Toll free office [phone]

## 2015-09-18 NOTE — Progress Notes (Signed)
Home oxygen ordered as requested and to be delivered to room today prior to discharging home. Mindi Slicker [facility] [phone]

## 2015-09-18 NOTE — Care Management Note (Signed)
Case Management Note  Patient Details  Name: [name] Powell MRN: [id number] Date of Birth: 14-Feb-1923  Subjective/Objective:     Admitted with Acute Respiratory Failure               Action/Plan: Patient lives at home with spouse, is active with [facility] for HHRN/ PT/ OT; patient plans to return home at discharge. He has a can and walker at home. CM talked to patient with spouse present, pt would like to resume [place] services with [facility]; Jetta Lout with [facility] called to resume services. Orders faxed as requested.  Expected Discharge Date:   09/18/2015               Expected Discharge Plan:  [place]  Discharge planning Services  CM Consult  Choice offered to:  Patient, Spouse  HH Arranged:  RN, Disease Management, PT, OT HH Agency:  [facility]  Status of Service:  In process, will continue to follow  Sherrilyn Rist [phone] 09/18/2015, 1:51 PM

## 2015-09-18 NOTE — Discharge Summary (Signed)
[name] Powell, is a 82 y.o. male  DOB [birth year]  MRN ID:[id number].  Admission date:  09/15/2015  Admitting Physician  Waldemar Dickens, MD  Discharge Date:  09/18/2015   Primary MD  [name], MD  Recommendations for primary care physician for things to follow:   Patient will finish antibiotic therapy with Augmentin.   Admission Diagnosis  Persistent atrial fibrillation (HCC) [I48.1] CAP (community acquired pneumonia) [J18.9] Pleural effusion, left [J94.8] Acute on chronic congestive heart failure, unspecified congestive heart failure type ([place]) [I50.9]   Discharge Diagnosis  Persistent atrial fibrillation ([name]) [I48.1] CAP (community acquired pneumonia) [J18.9] Pleural effusion, left [J94.8] Acute on chronic congestive heart failure, unspecified congestive heart failure type ([name]) [I50.9]    Principal Problem:   Acute respiratory failure with hypoxia ([name]) Active Problems:   Pacemaker dependent - Medtronic   CAD s/p CABG 2003   DM2 (diabetes mellitus, type 2) ([name])   HTN (hypertension)   CAP (community acquired pneumonia)   Acute on chronic diastolic (congestive) heart failure ([name])   "Walking corpse" syndrome   Atrial fibrillation (HCC)   CKD (chronic kidney disease), stage III      Past Medical History  Diagnosis Date  . Diabetes mellitus   . Hypertension   . Pacemaker 12/12/2006    Medtronic adapta  . Coronary artery disease   . 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 CHB (complete heart block) ([name])   . Dyslipidemia   . H/O prostate cancer   . Ventricular tachycardia (paroxysmal) ([name]) 03/28/2014  . New onset atrial flutter, persistent 07/04/2014  . Atrial fibrillation ([name])   . Cancer ([name])   . Prostate cancer ([name])   . "Walking corpse" syndrome   . Pleural effusion, left     Past Surgical History    Procedure Laterality Date  . Pacemaker insertion  12/12/2006    Medtronic adapta  . Coronary artery bypass graft  09/04/2001    LIMA to LAD,SVG to diagonal,SVG to ramus intermedius,SVG to PDA  . Prostate surgery  2001    cancer  . Nm myoview ltd  01/09/2010    no ischemia  . Tonsillectomy         HPI  from the history and physical done on the day of admission:   This is a 82 year old gentleman who was admitted hospital due to shortness of breath and generalized weakness and cough. From last 7 days prior to admission patient has again decreased oral intake, associated with cough, chills and subjective fevers. He was seen by his primary care physician who referr him to the emergency room for evaluation. On initial physical examination he was found hypoxic at 88% on Room Air. His blood pressure systolic was [id number], heart S1-S2 present irregularly irregular, his lungs had decreased breath sounds bilaterally with diffuse rhonchi next 3 faint wheeze, bibasilar Rales. Sodium was 134, creatinine 1.59 his white count was 5.1 with hemoglobin 10.2. His chest x-ray showing  increased interstitial markings bilaterally suggesting pulmonary edema with a progressive left lower lobe airspace concerning for pneumonia.  Patient was admitted to hospital with working diagnosis of decompensated heart failure complicated by community-acquired pneumonia, and hypoxic respiratory failure.     Hospital Course:   1. Cardiovascular. Patient was diuresed with frusemide, patient was treated with antibody therapy with ceftriaxone and Zithromax and intravenously, patient responded well with improvement of his symptoms. His white count remained about 7.4, his cultures were no growth. He was continued on metoprolol and anticoagulated with apixaban.  2. Pulmonary. Patient diuresis with furosemide, patient received oxygen supplementation place,, oximetry monitoring, patient responded well to therapy with antibiotics and diuresis.  Patient will have a laboratory oximetry on room air before discharge.  3. Nephrology. Patient's creatinine remained stable around 1.4, potassium 4.2, patient will be continue on his Lasix at home and restriction.  4. Endocrinology. Oral hypoglycemic medications were held, patient was placed on insulin sliding scale for glucose monitor and coverage. Serum glucose between 78, 126, 118.  5. Neurology. Patient will be continued on Sinemet, no confusion.Patient will resume home physical therapy and home nursing.She was continued on trazodone.  Discharge Condition: Stable  Follow UP  Follow-up Information    Follow up with [name].   Specialty:  [place]   Why:  They will continue to do your home health care at your home   Contact information:   [address] [id number] [phone]       Follow up with [name], MD In 1 week.   Specialty:  Family Medicine   Contact information:   [name] [address] [phone]        Consults obtained -   Diet and Activity recommendation: See Discharge Instructions below  Discharge Instructions    Patient is discharged home to continue his home therapy and home nursing, patient will finish and divided therapy with Augmentin.  Discharge Instructions    Diet - low sodium heart healthy    Complete by:  As directed      Increase activity slowly    Complete by:  As directed              Discharge Medications       Medication List    TAKE these medications        amoxicillin-clavulanate 500-125 MG tablet  Commonly known as:  AUGMENTIN  Take 1 tablet (500 mg total) by mouth 2 (two) times daily.     apixaban 5 MG Tabs tablet  Commonly known as:  ELIQUIS  TAKE 1 TABLET (5 MG TOTAL) BY MOUTH 2 (TWO) TIMES DAILY.     atorvastatin 20 MG tablet  Commonly known as:  LIPITOR  Take 20 mg by mouth daily.     benzonatate 100 MG capsule  Commonly  known as:  TESSALON  Take 1 capsule by mouth 3 (three) times daily. For 10 days.     carbidopa-levodopa 25-100 MG tablet  Commonly known as:  SINEMET IR  Take 1 tablet by mouth 3 (three) times daily before meals.     furosemide 20 MG tablet  Commonly known as:  LASIX  Take 1 tablet (20 mg total) by mouth daily.     glimepiride 4 MG tablet  Commonly known as:  AMARYL  Take 4 mg by mouth daily before breakfast.     JANUVIA 50 MG tablet  Generic drug:  sitaGLIPtin  Take 1 tablet  by mouth daily.     metFORMIN 500 MG 24 hr tablet  Commonly known as:  GLUCOPHAGE-XR  Take 500 mg by mouth daily.     metoprolol 50 MG tablet  Commonly known as:  LOPRESSOR  Take 2 tablets (100mg ) by mouth in the morning and 1 tablet (50mg ) in the evening.     mometasone 0.1 % cream  Commonly known as:  ELOCON  APPLY TO LEGS AS NEEDED FOR FOOT DERMATITIS     polyethylene glycol packet  Commonly known as:  MIRALAX / GLYCOLAX  Take 17 g by mouth daily. To prevent constipation     potassium chloride 10 MEQ tablet  Commonly known as:  K-DUR,KLOR-CON  Take 0.5 tablets (5 mEq total) by mouth daily.     PRESERVISION AREDS Caps  Take 1 capsule by mouth 2 (two) times daily.     valsartan 160 MG tablet  Commonly known as:  DIOVAN  TAKE 1 TABLET EVERY DAY     VITAMIN D PO  Take 5,000 Units by mouth 2 (two) times a week. On Friday        Major procedures and Radiology Reports - PLEASE review detailed and final reports for all details, in brief -      Ct Head Wo Contrast  08/25/2015   [facility] [address] [place], [place] [address] [phone] NEUROIMAGING REPORT STUDY DATE: 08/25/2015 PATIENT NAME: FINNIGAN ORBIN DOB: 1922/09/08 MRN: [id number] EXAM: CT of the head without contrast ORDERING CLINICIAN: Andrey Spearman M.D. CLINICAL HISTORY: 82 year old man with a gait disturbance and Parkinson's disease COMPARISON FILMS: CT scan 10/13/2011 TECHNIQUE: CT scan of the  head was obtained utilizing 5 mm axial slices from the skull base to the vertex.   Sagittal and coronal reconstructions were also obtained. CONTRAST: None IMAGING SITE: [facility],  [place] FINDINGS: On the reconstructed sagittal images, the cervicomedullary junction appears normal. The pituitary gland appears normal.     The fourth ventricle is normal in size without distortion. The third and lateral ventricles are moderately enlarged, in proportion to the extent of moderate cortical atrophy. Atrophy is essentially unchanged when compared to the CT scan dated 10/13/2011.  No extra-axial fluid collections are seen.  No intracranial hemorrhage.  No evidence of mass effect or midline shift.  The cerebellum and brainstem and deep gray matter appears normal. There are hypodense changes in the hemispheres mostly in the deep and periventricular white matter consistent with chronic microvascular ischemic change. The extent is similar to what was observed in 2013. None of the findings appears to be acute.. The orbits and their contents, paranasal sinuses and calvarium are unremarkable.  Compared to the CT scan dated 10/13/2011, the brain appears essentially unchanged. There has been resolution of the scalp hematoma present on the older scan.   08/25/2015   This CT scan of the head without contrast shows the following: 1.   Moderate cortical atrophy, essentially unchanged when compared to the CT scan from 10/13/2011. 2.   Moderate hypodense changes in the white matter of both hemispheres consistent with chronic microvascular ischemic change, essentially unchanged when compared to the 2013 CT. 3.   There are no acute findings. INTERPRETING PHYSICIAN: Richard A. Felecia Shelling, MD, PhD Certified in  Neuroimaging by [place] [employer]   Dg Chest Portable 1 View  09/15/2015  CLINICAL DATA:  Cough and fever. EXAM: PORTABLE CHEST - 1 VIEW COMPARISON:  Two-view chest x-ray 07/24/2015 FINDINGS: The heart is  enlarged. Progressive pulmonary vascular congestion and edema is present. Left lower lobe airspace disease has progressed. A left pleural effusion is present. Interstitial and airspace disease is present at the right base as well. Pacing wires are stable. The patient is status post median sternotomy. Pacing wires are stable. IMPRESSION: 1. Cardiomegaly with increasing interstitial edema suggesting congestive heart failure. 2. Progressive left lower lobe airspace disease worrisome for aspiration or pneumonia. 3. Progressive left-sided pleural effusion. Electronically Signed   By: San Morelle M.D.   On: 09/15/2015 13:17    Micro Results    Recent Results (from the past 240 hour(s))  Blood Culture (routine x 2)     Status: None (Preliminary result)   Collection Time: 09/15/15 12:45 PM  Result Value Ref Range Status   Specimen Description BLOOD RIGHT ANTECUBITAL  Final   Special Requests BOTTLES DRAWN AEROBIC AND ANAEROBIC 5CC  Final   Culture NO GROWTH 2 DAYS  Final   Report Status PENDING  Incomplete  Blood Culture (routine x 2)     Status: None (Preliminary result)   Collection Time: 09/15/15  1:10 PM  Result Value Ref Range Status   Specimen Description RIGHT ANTECUBITAL  Final   Special Requests BOTTLES DRAWN AEROBIC AND ANAEROBIC 5ML  Final   Culture NO GROWTH 2 DAYS  Final   Report Status PENDING  Incomplete  Urine culture     Status: Abnormal   Collection Time: 09/15/15  2:16 PM  Result Value Ref Range Status   Specimen Description URINE, CLEAN CATCH  Final   Special Requests NONE  Final   Culture MULTIPLE SPECIES PRESENT, SUGGEST RECOLLECTION (A)  Final   Report Status 09/17/2015 FINAL  Final       Today   Subjective    Hermelinda Dellen patient is feeling back to his baseline, is out of bed to a chair, his dyspnea has improved, no nausea, vomiting or diarrhea.  Blood pressure 127/55, pulse 60, temperature 97.5 F (36.4 C), temperature source Oral, resp. rate 18,  height 5\' 8"  (1.727 m), weight 85.957 kg (189 lb 8 oz), SpO2 96 %.   Intake/Output Summary (Last 24 hours) at 09/18/15 1420 Last data filed at 09/18/15 1246  Gross per 24 hour  Intake   1020 ml  Output   1000 ml  Net     20 ml    Exam Awake Alert, Oriented x 3, No new F.N deficits, Normal affect [place].AT,PERRAL Supple Neck,No JVD, No cervical lymphadenopathy appriciated.  Symmetrical Chest wall movement, Decreased inspiratory effort with decreased breath sounds at bases., CTAB RRR,No Gallops,Rubs or new Murmurs, No Parasternal Heave +ve B.Sounds, Abd Soft, Non tender, No organomegaly appriciated, No rebound -guarding or rigidity. No Cyanosis, Clubbing, trace edema, No new Rash or bruise   Data Review   CBC w Diff: Lab Results  Component Value Date   WBC 7.4 09/18/2015   HGB 8.5* 09/18/2015   HCT 27.7* 09/18/2015   PLT 200 09/18/2015   LYMPHOPCT 21 09/18/2015   MONOPCT 16 09/18/2015   EOSPCT 5 09/18/2015   BASOPCT 0 09/18/2015    CMP: Lab Results  Component Value Date   NA 139 09/18/2015   K 4.2 09/18/2015   CL 102 09/18/2015   CO2 27 09/18/2015   BUN 27* 09/18/2015   CREATININE 1.40* 09/18/2015   CREATININE 1.61* 08/05/2015   PROT 7.0 09/15/2015   ALBUMIN 3.1* 09/15/2015   BILITOT 0.9 09/15/2015   ALKPHOS 95 09/15/2015   AST 21 09/15/2015  ALT 6* 09/15/2015  .   Total Time in preparing paper work, data evaluation and todays exam - 45 minutes  Tawni Millers M.D on 09/18/2015 at 2:20 PM  [facility]   Office  [phone]

## 2015-09-20 LAB — CULTURE, BLOOD (ROUTINE X 2)
CULTURE: NO GROWTH
CULTURE: NO GROWTH

## 2015-09-22 ENCOUNTER — Other Ambulatory Visit: Payer: Self-pay

## 2015-09-22 DIAGNOSIS — J189 Pneumonia, unspecified organism: Secondary | ICD-10-CM | POA: Diagnosis not present

## 2015-09-22 DIAGNOSIS — Z79899 Other long term (current) drug therapy: Secondary | ICD-10-CM | POA: Diagnosis not present

## 2015-09-22 DIAGNOSIS — N183 Chronic kidney disease, stage 3 (moderate): Secondary | ICD-10-CM | POA: Diagnosis not present

## 2015-09-22 DIAGNOSIS — R0902 Hypoxemia: Secondary | ICD-10-CM | POA: Diagnosis not present

## 2015-09-22 NOTE — Patient Outreach (Signed)
[facility]) Care Management  09/22/2015  [name] Powell [birth year] ID:[id number]  82 year old with recent admission for 5/8-5/11 with pneumonia, other issues atrial fibrillation, acute on chronic heart failure. RNCM called to complete transition of care. No answer. HIPPA compliant message left.  Plan: continue to attempt to reach patient.  Thea Silversmith, RN, MSN, [place] Coordinator Cell: [phone]

## 2015-09-23 ENCOUNTER — Other Ambulatory Visit: Payer: Self-pay

## 2015-09-23 DIAGNOSIS — I10 Essential (primary) hypertension: Secondary | ICD-10-CM | POA: Diagnosis not present

## 2015-09-23 DIAGNOSIS — Z8701 Personal history of pneumonia (recurrent): Secondary | ICD-10-CM | POA: Diagnosis not present

## 2015-09-23 DIAGNOSIS — Z9981 Dependence on supplemental oxygen: Secondary | ICD-10-CM | POA: Diagnosis not present

## 2015-09-23 DIAGNOSIS — Z950 Presence of cardiac pacemaker: Secondary | ICD-10-CM | POA: Diagnosis not present

## 2015-09-23 DIAGNOSIS — I25810 Atherosclerosis of coronary artery bypass graft(s) without angina pectoris: Secondary | ICD-10-CM | POA: Diagnosis not present

## 2015-09-23 DIAGNOSIS — N183 Chronic kidney disease, stage 3 (moderate): Secondary | ICD-10-CM | POA: Diagnosis not present

## 2015-09-23 DIAGNOSIS — R0902 Hypoxemia: Secondary | ICD-10-CM | POA: Diagnosis not present

## 2015-09-23 DIAGNOSIS — I4891 Unspecified atrial fibrillation: Secondary | ICD-10-CM | POA: Diagnosis not present

## 2015-09-23 DIAGNOSIS — E119 Type 2 diabetes mellitus without complications: Secondary | ICD-10-CM | POA: Diagnosis not present

## 2015-09-23 NOTE — Patient Outreach (Signed)
[facility]) Care Management  09/23/2015  [name] Powell June 02, 1922 ID:[id number]   RNCM called and spoke with member's wife who states that she remembers the hospital liaison speaking with her in the hospital about services and also acknowledged she has the [facility] packet. [name] reports that they already have services and do not wish to participate with [place] [facility]. RNCM explained the difference in home health and care management, reinforced it is no charge for the services. [name] continues to decline services.  Plan: close case.  Thea Silversmith, RN, MSN, [place] Coordinator Cell: [phone]

## 2015-09-25 ENCOUNTER — Ambulatory Visit (INDEPENDENT_AMBULATORY_CARE_PROVIDER_SITE_OTHER): Payer: Commercial Managed Care - HMO | Admitting: Diagnostic Neuroimaging

## 2015-09-25 ENCOUNTER — Encounter: Payer: Self-pay | Admitting: Diagnostic Neuroimaging

## 2015-09-25 VITALS — BP 135/61 | HR 62 | Ht 68.0 in | Wt 193.0 lb

## 2015-09-25 DIAGNOSIS — G20 Parkinson's disease: Secondary | ICD-10-CM

## 2015-09-25 DIAGNOSIS — R269 Unspecified abnormalities of gait and mobility: Secondary | ICD-10-CM | POA: Diagnosis not present

## 2015-09-25 DIAGNOSIS — E1142 Type 2 diabetes mellitus with diabetic polyneuropathy: Secondary | ICD-10-CM

## 2015-09-25 DIAGNOSIS — R490 Dysphonia: Secondary | ICD-10-CM | POA: Diagnosis not present

## 2015-09-25 NOTE — Patient Instructions (Signed)
-   reduce carbidopa/levodopa to half tab three times per day for 2 weeks, then stop.  - monitor symptoms over next 1-2 months  - if walking/energy worsen off the carb/levo, then may restart medication

## 2015-09-25 NOTE — Progress Notes (Signed)
[facility]  PATIENT: [name] Powell DOB: [birth year]  REFERRING CLINICIAN: Edwin Dada, MD HISTORY FROM: patient, wife, son REASON FOR VISIT: new consult     HISTORICAL  CHIEF COMPLAINT:  Chief Complaint  Patient presents with  . Parkinson's disease    rm 7, wife-Betty, dgtr-in-law Kennyth Lose, In hosp for pneumonia early May, "no new problems with Parkinson's, completed 3 wks PT/OT "  . Follow-up    6 wk    HISTORY OF PRESENT ILLNESS:   UPDATE 09/25/15: Since last visit, doing better, except for hosp stay for pneumonia in April 2017. Patient doesn't feel carb/levo is helping, but family think he has more energy now. PT also helping. Now on home O2 since past 1 week.  PRIOR HPI (08/13/15): 82 year old right-handed male here for evaluation of gait instability, memory loss, frequent falling.  For past 2 years patient has had increasing problems with balance, walking, coordination and increasing falls. Over the past 3 weeks patient has had generalized weakness, walking on toes, increasing shuffling gait. Patient also having problems with short-term memory, confusion, difficulty with tasks such as crossword puzzles which he used to be able to do easily. Patient is a retired Art gallery manager and was very high functioning from intellectual standpoint. Patient has had significant decline from his previous level of functioning. Patient has developed quiet hoarse voice, word finding difficulties, decreased facial expressions. In general he has slowed down quite a bit.  Patient also has constipation and urinary incontinence. He has tendency to punch and kick in his sleep and act out his dreams although this is been going on for more than 25 years according to patient's wife. No change in sense of smell or taste.   REVIEW OF SYSTEMS: Full 14 system review of systems performed and negative with exception of: cough wheezing walking diff.   ALLERGIES: Allergies  Allergen Reactions    . Mexitil [Mexiletine]     unknown  . Lanoxin [Digoxin] Rash    Eyelid rash    HOME MEDICATIONS: Outpatient Prescriptions Prior to Visit  Medication Sig Dispense Refill  . apixaban (ELIQUIS) 5 MG TABS tablet TAKE 1 TABLET (5 MG TOTAL) BY MOUTH 2 (TWO) TIMES DAILY. 30 tablet 1  . atorvastatin (LIPITOR) 20 MG tablet Take 20 mg by mouth daily.    . benzonatate (TESSALON) 100 MG capsule Take 1 capsule by mouth 3 (three) times daily. For 10 days.    . carbidopa-levodopa (SINEMET IR) 25-100 MG tablet Take 1 tablet by mouth 3 (three) times daily before meals. 90 tablet 6  . Cholecalciferol (VITAMIN D PO) Take 5,000 Units by mouth 2 (two) times a week. On Friday    . furosemide (LASIX) 20 MG tablet Take 1 tablet (20 mg total) by mouth daily. 90 tablet 3  . glimepiride (AMARYL) 4 MG tablet Take 4 mg by mouth daily before breakfast.    . JANUVIA 50 MG tablet Take 1 tablet by mouth daily.    . metFORMIN (GLUCOPHAGE-XR) 500 MG 24 hr tablet Take 500 mg by mouth daily.    . metoprolol (LOPRESSOR) 50 MG tablet Take 2 tablets (100mg ) by mouth in the morning and 1 tablet (50mg ) in the evening. 270 tablet 2  . mometasone (ELOCON) 0.1 % cream APPLY TO LEGS AS NEEDED FOR FOOT DERMATITIS  0  . Multiple Vitamins-Minerals (PRESERVISION AREDS) CAPS Take 1 capsule by mouth 2 (two) times daily.    . polyethylene glycol (MIRALAX / GLYCOLAX) packet Take 17 g by mouth  daily. To prevent constipation    . potassium chloride (K-DUR,KLOR-CON) 10 MEQ tablet Take 0.5 tablets (5 mEq total) by mouth daily. 45 tablet 3  . valsartan (DIOVAN) 160 MG tablet TAKE 1 TABLET EVERY DAY 90 tablet 1  . amoxicillin-clavulanate (AUGMENTIN) 500-125 MG tablet Take 1 tablet (500 mg total) by mouth 2 (two) times daily. 10 tablet 0   No facility-administered medications prior to visit.    PAST MEDICAL HISTORY: Past Medical History  Diagnosis Date  . Diabetes mellitus   . Hypertension   . Pacemaker 12/12/2006    Medtronic adapta  .  Coronary artery disease   . 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 CHB (complete heart block) ([place])   . Dyslipidemia   . H/O prostate cancer   . Ventricular tachycardia (paroxysmal) ([place]) 03/28/2014  . New onset atrial flutter, persistent 07/04/2014  . Atrial fibrillation ([place])   . Cancer ([place])   . Prostate cancer ([place])   . "Walking corpse" syndrome   . Pleural effusion, left   . Pneumonia 09/2015    PAST SURGICAL HISTORY: Past Surgical History  Procedure Laterality Date  . Pacemaker insertion  12/12/2006    Medtronic adapta  . Coronary artery bypass graft  09/04/2001    LIMA to LAD,SVG to diagonal,SVG to ramus intermedius,SVG to PDA  . Prostate surgery  2001    cancer  . Nm myoview ltd  01/09/2010    no ischemia  . Tonsillectomy      FAMILY HISTORY: Family History  Problem Relation Age of Onset  . Heart disease Mother     SOCIAL HISTORY:  Social History   Social History  . Marital Status: Married    Spouse Name: Inez Catalina  . Number of Children: 2  . Years of Education: 16   Occupational History  .      retired Art gallery manager   Social History Main Topics  . Smoking status: Former Smoker    Types: Cigarettes, Cigars    Quit date: 05/14/1962  . Smokeless tobacco: Never Used  . Alcohol Use: Yes     Comment: seldom  . Drug Use: No  . Sexual Activity: Not on file   Other Topics Concern  . Not on file   Social History Narrative   Lives at home with wife   Caffeine -seldom     PHYSICAL EXAM  GENERAL EXAM/CONSTITUTIONAL: Vitals:  Filed Vitals:   09/25/15 1436  BP: 135/61  Pulse: 62  Height: 5\' 8"  (1.727 m)  Weight: 193 lb (87.544 kg)   Body mass index is 29.35 kg/(m^2). No exam data present  Patient is in no distress; well developed, nourished and groomed; neck is supple  CARDIOVASCULAR:  Examination of carotid arteries is normal; no carotid bruits  Regular rate and rhythm, no  murmurs  Examination of peripheral vascular system by observation and palpation is normal  EYES:  Ophthalmoscopic exam of optic discs and posterior segments is normal; no papilledema or hemorrhages  MUSCULOSKELETAL:  Gait, strength, tone, movements noted in Neurologic exam below  NEUROLOGIC: MENTAL STATUS:  MMSE - Mini Mental State Exam 08/13/2015  Orientation to time 5  Orientation to Place 5  Registration 3  Attention/ Calculation 2  Recall 2  Language- name 2 objects 2  Language- repeat 1  Language- follow 3 step command 3  Language- read & follow direction 1  Write a sentence 1  Copy design 1  Total score 26    awake, alert, oriented to person, place and time  recent and remote memory intact  normal attention and concentration  language fluent, comprehension intact, naming intact,   fund of knowledge appropriate  CRANIAL NERVE:   2nd - no papilledema on fundoscopic exam  2nd, 3rd, 4th, 6th - pupils equal and reactive to light, visual fields full to confrontation, extraocular muscles intact, no nystagmus  5th - facial sensation symmetric  7th - facial strength symmetric  8th - hearing intact  9th - palate elevates symmetrically, uvula midline  11th - shoulder shrug symmetric  12th - tongue protrusion midline  HOARSE VOICE  MASKED FACIES  MOTOR:   normal bulk  MODERATE COGWHEELING RIGIDITY IN BUE  MODERATE BRADYKINESIA IN BLE  full strength in the BUE, BLE  SENSORY:   normal and symmetric to light touch, temperature  DECR VIB AT TOES AND ANKLES  COORDINATION:   finger-nose-finger, fine finger movements normal  REFLEXES:   deep tendon reflexes TRACE IN BUE; ABSENT IN BLE  POSITIVE MYERSONS  NEGATIVE SNOUT  GAIT/STATION:   narrow based gait; SLOW TO RISE; STOOPED POSTURE; DECR ARM SWING; SHORT STEPS; EN BLOC TURNING; USES SINGLE POINT CANE    DIAGNOSTIC DATA (LABS, IMAGING, TESTING) - I reviewed patient records, labs,  notes, testing and imaging myself where available.  Lab Results  Component Value Date   WBC 7.4 09/18/2015   HGB 8.5* 09/18/2015   HCT 27.7* 09/18/2015   MCV 95.8 09/18/2015   PLT 200 09/18/2015      Component Value Date/Time   NA 139 09/18/2015 0301   K 4.2 09/18/2015 0301   CL 102 09/18/2015 0301   CO2 27 09/18/2015 0301   GLUCOSE 118* 09/18/2015 0301   BUN 27* 09/18/2015 0301   CREATININE 1.40* 09/18/2015 0301   CREATININE 1.61* 08/05/2015 1013   CALCIUM 8.0* 09/18/2015 0301   PROT 7.0 09/15/2015 1243   ALBUMIN 3.1* 09/15/2015 1243   AST 21 09/15/2015 1243   ALT 6* 09/15/2015 1243   ALKPHOS 95 09/15/2015 1243   BILITOT 0.9 09/15/2015 1243   GFRNONAA 42* 09/18/2015 0301   GFRAA 49* 09/18/2015 0301   No results found for: CHOL, HDL, LDLCALC, LDLDIRECT, TRIG, CHOLHDL Lab Results  Component Value Date   HGBA1C 8.6* 09/15/2015   No results found for: VITAMINB12 No results found for: TSH  10/13/11 CT head [I reviewed images myself and agree with interpretation. -VRP]  1. No acute intracranial or calvarial findings. 2. Left frontal scalp soft tissue swelling.  12/11/13 TTE - Normal LV size with moderate LV hypertrophy. EF 55% with septal-lateral dyssynchrony probably from pacing and apicalhypokinesis. Mildly dilated RV wtih mildly decreased systolicfunction.  08/25/15 CT head [I reviewed images myself and agree with interpretation. -VRP]  1. Moderate cortical atrophy, essentially unchanged when compared to the CT scan from 10/13/2011. 2. Moderate hypodense changes in the white matter of both hemispheres consistent with chronic microvascular ischemic change, essentially unchanged when compared to the 2013 CT. 3. There are no acute findings.    ASSESSMENT AND PLAN  82 y.o. year old male here with gradual onset progressive balance and gait difficulty, short steps, shuffling gait, masked facies, hoarse voice, word finding difficulties, short-term memory loss,  decreased cognitive ability and generalized weakness. Signs and symptoms are suspicious for neurodegenerative process such as Parkinson's disease. Patient also has signs of diabetic neuropathy may be contributed factors well.    Ddx: parkinsonism (idiopathic vs vascular) + diabetic neuropathy  1. Parkinson's disease ([place])   2. Gait difficulty   3. Hoarse voice quality   4. Diabetic polyneuropathy associated with type 2 diabetes mellitus (H[facility])      PLAN: - use rollator walker - continue physical therapy - may try weaning off carbidopa-levodopa (patient does not feel there is benefit although wife and daughter-in-law feel there may be some benefit)  Return in about 3 months (around 12/26/2015).    [name], MD [id number], [id number] PM Certified in Neurology, Neurophysiology and Neuroimaging  [facility] [address], [place], [place] [address] [phone]

## 2015-09-26 DIAGNOSIS — I10 Essential (primary) hypertension: Secondary | ICD-10-CM | POA: Diagnosis not present

## 2015-09-26 DIAGNOSIS — I25810 Atherosclerosis of coronary artery bypass graft(s) without angina pectoris: Secondary | ICD-10-CM | POA: Diagnosis not present

## 2015-09-26 DIAGNOSIS — E119 Type 2 diabetes mellitus without complications: Secondary | ICD-10-CM | POA: Diagnosis not present

## 2015-09-26 DIAGNOSIS — N183 Chronic kidney disease, stage 3 (moderate): Secondary | ICD-10-CM | POA: Diagnosis not present

## 2015-09-26 DIAGNOSIS — I4891 Unspecified atrial fibrillation: Secondary | ICD-10-CM | POA: Diagnosis not present

## 2015-09-26 DIAGNOSIS — Z950 Presence of cardiac pacemaker: Secondary | ICD-10-CM | POA: Diagnosis not present

## 2015-09-26 DIAGNOSIS — Z8701 Personal history of pneumonia (recurrent): Secondary | ICD-10-CM | POA: Diagnosis not present

## 2015-09-26 DIAGNOSIS — R0902 Hypoxemia: Secondary | ICD-10-CM | POA: Diagnosis not present

## 2015-09-26 DIAGNOSIS — Z9981 Dependence on supplemental oxygen: Secondary | ICD-10-CM | POA: Diagnosis not present

## 2015-09-28 DIAGNOSIS — I4891 Unspecified atrial fibrillation: Secondary | ICD-10-CM | POA: Diagnosis not present

## 2015-09-28 DIAGNOSIS — I10 Essential (primary) hypertension: Secondary | ICD-10-CM | POA: Diagnosis not present

## 2015-09-28 DIAGNOSIS — Z8701 Personal history of pneumonia (recurrent): Secondary | ICD-10-CM | POA: Diagnosis not present

## 2015-09-28 DIAGNOSIS — Z9981 Dependence on supplemental oxygen: Secondary | ICD-10-CM | POA: Diagnosis not present

## 2015-09-28 DIAGNOSIS — R0902 Hypoxemia: Secondary | ICD-10-CM | POA: Diagnosis not present

## 2015-09-28 DIAGNOSIS — N183 Chronic kidney disease, stage 3 (moderate): Secondary | ICD-10-CM | POA: Diagnosis not present

## 2015-09-28 DIAGNOSIS — E119 Type 2 diabetes mellitus without complications: Secondary | ICD-10-CM | POA: Diagnosis not present

## 2015-09-28 DIAGNOSIS — Z950 Presence of cardiac pacemaker: Secondary | ICD-10-CM | POA: Diagnosis not present

## 2015-09-28 DIAGNOSIS — I25810 Atherosclerosis of coronary artery bypass graft(s) without angina pectoris: Secondary | ICD-10-CM | POA: Diagnosis not present

## 2015-09-29 ENCOUNTER — Telehealth: Payer: Self-pay | Admitting: Diagnostic Neuroimaging

## 2015-09-29 DIAGNOSIS — I4891 Unspecified atrial fibrillation: Secondary | ICD-10-CM | POA: Diagnosis not present

## 2015-09-29 DIAGNOSIS — Z9981 Dependence on supplemental oxygen: Secondary | ICD-10-CM | POA: Diagnosis not present

## 2015-09-29 DIAGNOSIS — Z950 Presence of cardiac pacemaker: Secondary | ICD-10-CM | POA: Diagnosis not present

## 2015-09-29 DIAGNOSIS — I25810 Atherosclerosis of coronary artery bypass graft(s) without angina pectoris: Secondary | ICD-10-CM | POA: Diagnosis not present

## 2015-09-29 DIAGNOSIS — Z8701 Personal history of pneumonia (recurrent): Secondary | ICD-10-CM | POA: Diagnosis not present

## 2015-09-29 DIAGNOSIS — N183 Chronic kidney disease, stage 3 (moderate): Secondary | ICD-10-CM | POA: Diagnosis not present

## 2015-09-29 DIAGNOSIS — E119 Type 2 diabetes mellitus without complications: Secondary | ICD-10-CM | POA: Diagnosis not present

## 2015-09-29 DIAGNOSIS — R0902 Hypoxemia: Secondary | ICD-10-CM | POA: Diagnosis not present

## 2015-09-29 DIAGNOSIS — I10 Essential (primary) hypertension: Secondary | ICD-10-CM | POA: Diagnosis not present

## 2015-09-29 NOTE — Telephone Encounter (Signed)
Pt's wife called and says pt starting taking full dose of   carbidopa-levodopa (SINEMET IR) 25-100 MG tablet      Please call back

## 2015-09-29 NOTE — Telephone Encounter (Signed)
Called wife who stated she weaned patient off carbidopa-levodopa as Dr Leta Baptist instructed, weaning him off with 1/2 tablet x 2 weeks. She stated she noticed a difference in his walking, so she restarted medication at full dose. She stated that this was per Dr Gladstone Lighter recommendation. She stated physical therapist felt medication had been helping patient too. She wanted to let dr know. Informed her this RN will update his med list and inform Dr Leta Baptist. Advised she call for any concerns before next follow up. She verbalized understanding, appreciation for call back.

## 2015-09-30 ENCOUNTER — Telehealth: Payer: Self-pay | Admitting: Cardiovascular Disease

## 2015-09-30 DIAGNOSIS — I10 Essential (primary) hypertension: Secondary | ICD-10-CM | POA: Diagnosis not present

## 2015-09-30 DIAGNOSIS — I4891 Unspecified atrial fibrillation: Secondary | ICD-10-CM | POA: Diagnosis not present

## 2015-09-30 DIAGNOSIS — Z950 Presence of cardiac pacemaker: Secondary | ICD-10-CM | POA: Diagnosis not present

## 2015-09-30 DIAGNOSIS — R0902 Hypoxemia: Secondary | ICD-10-CM | POA: Diagnosis not present

## 2015-09-30 DIAGNOSIS — N183 Chronic kidney disease, stage 3 (moderate): Secondary | ICD-10-CM | POA: Diagnosis not present

## 2015-09-30 DIAGNOSIS — Z79899 Other long term (current) drug therapy: Secondary | ICD-10-CM

## 2015-09-30 DIAGNOSIS — I25810 Atherosclerosis of coronary artery bypass graft(s) without angina pectoris: Secondary | ICD-10-CM | POA: Diagnosis not present

## 2015-09-30 DIAGNOSIS — E119 Type 2 diabetes mellitus without complications: Secondary | ICD-10-CM | POA: Diagnosis not present

## 2015-09-30 DIAGNOSIS — Z9981 Dependence on supplemental oxygen: Secondary | ICD-10-CM | POA: Diagnosis not present

## 2015-09-30 DIAGNOSIS — Z8701 Personal history of pneumonia (recurrent): Secondary | ICD-10-CM | POA: Diagnosis not present

## 2015-09-30 NOTE — Telephone Encounter (Signed)
Let's try this: Furosemide 40 mg daily and KCl 10 mEq daily 4 days a week (TueThuSatSun) Furosemide 20 mg daily and KCl 5 mEq daily other 3 days a week (MonWedFri) BMET and BNP in 1 week. Call back with weight and symptoms 8-9 days, review regimen based on labs and symptoms.

## 2015-09-30 NOTE — Telephone Encounter (Signed)
Returned call to pt's wife, ok per DPR. She says pt has had swelling in his ankles and feet that has increased gradually since his last ov with Dr Sallyanne Kuster on 08/05/15. Now she describes it as "very tight and his skin looks very tight where you can't see his ankles." Pt did take a weight on 5/21 of 186.2 lbs (first thing in the morning with no clothes on) and weight today was taken mid-day after eating and performing ADLs was 192.2. Educated her on importance of weighing at the same time every day, preferably first thing in the morning to have a more accurate account of weight. Advised pt to take an extra 20 mg tablet of furosemide today and that I would route this to MD for further advice. Wife is wanting to know if he should be more furosemide and potassium each day.  Routing to Dr [employer] for further advice.

## 2015-09-30 NOTE — Telephone Encounter (Signed)
Called pt's DPR back. Gave her Dr Croitoru's orders and she read back the instructions to me. Will mail lab slips to pt.  She verbalized understanding to call us back in 8-9 weeks with an update.

## 2015-09-30 NOTE — Telephone Encounter (Signed)
New message      Pt c/o medication issue:  1. Name of Medication:  Potassium and furosemide 2. How are you currently taking this medication (dosage and times per day)? Furosemide 20mg  daily; potassium 0.5mg  daily 3. Are you having a reaction (difficulty breathing--STAT)? no 4. What is your medication issue? Medication dosage was cut in half and now his feet/ankles are swollen.  Should he start taking more medication?

## 2015-10-02 DIAGNOSIS — Z950 Presence of cardiac pacemaker: Secondary | ICD-10-CM | POA: Diagnosis not present

## 2015-10-02 DIAGNOSIS — I25810 Atherosclerosis of coronary artery bypass graft(s) without angina pectoris: Secondary | ICD-10-CM | POA: Diagnosis not present

## 2015-10-02 DIAGNOSIS — I4891 Unspecified atrial fibrillation: Secondary | ICD-10-CM | POA: Diagnosis not present

## 2015-10-02 DIAGNOSIS — N183 Chronic kidney disease, stage 3 (moderate): Secondary | ICD-10-CM | POA: Diagnosis not present

## 2015-10-02 DIAGNOSIS — Z9981 Dependence on supplemental oxygen: Secondary | ICD-10-CM | POA: Diagnosis not present

## 2015-10-02 DIAGNOSIS — E119 Type 2 diabetes mellitus without complications: Secondary | ICD-10-CM | POA: Diagnosis not present

## 2015-10-02 DIAGNOSIS — Z8701 Personal history of pneumonia (recurrent): Secondary | ICD-10-CM | POA: Diagnosis not present

## 2015-10-02 DIAGNOSIS — I10 Essential (primary) hypertension: Secondary | ICD-10-CM | POA: Diagnosis not present

## 2015-10-02 DIAGNOSIS — R0902 Hypoxemia: Secondary | ICD-10-CM | POA: Diagnosis not present

## 2015-10-03 DIAGNOSIS — I25810 Atherosclerosis of coronary artery bypass graft(s) without angina pectoris: Secondary | ICD-10-CM | POA: Diagnosis not present

## 2015-10-03 DIAGNOSIS — Z8701 Personal history of pneumonia (recurrent): Secondary | ICD-10-CM | POA: Diagnosis not present

## 2015-10-03 DIAGNOSIS — E119 Type 2 diabetes mellitus without complications: Secondary | ICD-10-CM | POA: Diagnosis not present

## 2015-10-03 DIAGNOSIS — I10 Essential (primary) hypertension: Secondary | ICD-10-CM | POA: Diagnosis not present

## 2015-10-03 DIAGNOSIS — Z950 Presence of cardiac pacemaker: Secondary | ICD-10-CM | POA: Diagnosis not present

## 2015-10-03 DIAGNOSIS — Z9981 Dependence on supplemental oxygen: Secondary | ICD-10-CM | POA: Diagnosis not present

## 2015-10-03 DIAGNOSIS — N183 Chronic kidney disease, stage 3 (moderate): Secondary | ICD-10-CM | POA: Diagnosis not present

## 2015-10-03 DIAGNOSIS — R0902 Hypoxemia: Secondary | ICD-10-CM | POA: Diagnosis not present

## 2015-10-03 DIAGNOSIS — I4891 Unspecified atrial fibrillation: Secondary | ICD-10-CM | POA: Diagnosis not present

## 2015-10-07 ENCOUNTER — Telehealth: Payer: Self-pay | Admitting: Cardiovascular Disease

## 2015-10-07 DIAGNOSIS — Z8701 Personal history of pneumonia (recurrent): Secondary | ICD-10-CM | POA: Diagnosis not present

## 2015-10-07 DIAGNOSIS — R0902 Hypoxemia: Secondary | ICD-10-CM | POA: Diagnosis not present

## 2015-10-07 DIAGNOSIS — R0602 Shortness of breath: Secondary | ICD-10-CM | POA: Diagnosis not present

## 2015-10-07 DIAGNOSIS — Z79899 Other long term (current) drug therapy: Secondary | ICD-10-CM | POA: Diagnosis not present

## 2015-10-07 DIAGNOSIS — I4891 Unspecified atrial fibrillation: Secondary | ICD-10-CM | POA: Diagnosis not present

## 2015-10-07 DIAGNOSIS — Z9981 Dependence on supplemental oxygen: Secondary | ICD-10-CM | POA: Diagnosis not present

## 2015-10-07 DIAGNOSIS — I10 Essential (primary) hypertension: Secondary | ICD-10-CM | POA: Diagnosis not present

## 2015-10-07 DIAGNOSIS — N183 Chronic kidney disease, stage 3 (moderate): Secondary | ICD-10-CM | POA: Diagnosis not present

## 2015-10-07 DIAGNOSIS — I25810 Atherosclerosis of coronary artery bypass graft(s) without angina pectoris: Secondary | ICD-10-CM | POA: Diagnosis not present

## 2015-10-07 DIAGNOSIS — Z950 Presence of cardiac pacemaker: Secondary | ICD-10-CM | POA: Diagnosis not present

## 2015-10-07 DIAGNOSIS — E119 Type 2 diabetes mellitus without complications: Secondary | ICD-10-CM | POA: Diagnosis not present

## 2015-10-07 NOTE — Telephone Encounter (Signed)
Spoke with pt wife, pts swelling is going down and the pt is feeling a lot better. Weight is as follows- wed- 186.2lb, thurs-185.6lb, fri-186.0lb, sat-185.2lb, sun-185.0, mon 184.6lb and today 185.4lb. Pt had labs drawn today. Will forward information to dr croitoru

## 2015-10-07 NOTE — Telephone Encounter (Signed)
Please call,she is supposed to give you an update of his weight for the past week,

## 2015-10-07 NOTE — Telephone Encounter (Signed)
Spoke with pt wife, aware to continue on the same medications for now.

## 2015-10-07 NOTE — Telephone Encounter (Signed)
Interesting, edema better, but weight is not much changed. I guess continue same meds

## 2015-10-08 DIAGNOSIS — E119 Type 2 diabetes mellitus without complications: Secondary | ICD-10-CM | POA: Diagnosis not present

## 2015-10-08 DIAGNOSIS — Z950 Presence of cardiac pacemaker: Secondary | ICD-10-CM | POA: Diagnosis not present

## 2015-10-08 DIAGNOSIS — N183 Chronic kidney disease, stage 3 (moderate): Secondary | ICD-10-CM | POA: Diagnosis not present

## 2015-10-08 DIAGNOSIS — I4891 Unspecified atrial fibrillation: Secondary | ICD-10-CM | POA: Diagnosis not present

## 2015-10-08 DIAGNOSIS — Z9981 Dependence on supplemental oxygen: Secondary | ICD-10-CM | POA: Diagnosis not present

## 2015-10-08 DIAGNOSIS — I10 Essential (primary) hypertension: Secondary | ICD-10-CM | POA: Diagnosis not present

## 2015-10-08 DIAGNOSIS — I25810 Atherosclerosis of coronary artery bypass graft(s) without angina pectoris: Secondary | ICD-10-CM | POA: Diagnosis not present

## 2015-10-08 DIAGNOSIS — Z8701 Personal history of pneumonia (recurrent): Secondary | ICD-10-CM | POA: Diagnosis not present

## 2015-10-08 DIAGNOSIS — R0902 Hypoxemia: Secondary | ICD-10-CM | POA: Diagnosis not present

## 2015-10-08 LAB — BASIC METABOLIC PANEL
BUN: 25 mg/dL (ref 7–25)
CALCIUM: 8.8 mg/dL (ref 8.6–10.3)
CHLORIDE: 104 mmol/L (ref 98–110)
CO2: 27 mmol/L (ref 20–31)
CREATININE: 1.16 mg/dL — AB (ref 0.70–1.11)
GLUCOSE: 155 mg/dL — AB (ref 65–99)
Potassium: 4.8 mmol/L (ref 3.5–5.3)
Sodium: 140 mmol/L (ref 135–146)

## 2015-10-08 LAB — BRAIN NATRIURETIC PEPTIDE: Brain Natriuretic Peptide: 329.2 pg/mL — ABNORMAL HIGH (ref <100)

## 2015-10-09 DIAGNOSIS — I25810 Atherosclerosis of coronary artery bypass graft(s) without angina pectoris: Secondary | ICD-10-CM | POA: Diagnosis not present

## 2015-10-09 DIAGNOSIS — Z8701 Personal history of pneumonia (recurrent): Secondary | ICD-10-CM | POA: Diagnosis not present

## 2015-10-09 DIAGNOSIS — I4891 Unspecified atrial fibrillation: Secondary | ICD-10-CM | POA: Diagnosis not present

## 2015-10-09 DIAGNOSIS — Z950 Presence of cardiac pacemaker: Secondary | ICD-10-CM | POA: Diagnosis not present

## 2015-10-09 DIAGNOSIS — Z9981 Dependence on supplemental oxygen: Secondary | ICD-10-CM | POA: Diagnosis not present

## 2015-10-09 DIAGNOSIS — N183 Chronic kidney disease, stage 3 (moderate): Secondary | ICD-10-CM | POA: Diagnosis not present

## 2015-10-09 DIAGNOSIS — R0902 Hypoxemia: Secondary | ICD-10-CM | POA: Diagnosis not present

## 2015-10-09 DIAGNOSIS — I10 Essential (primary) hypertension: Secondary | ICD-10-CM | POA: Diagnosis not present

## 2015-10-09 DIAGNOSIS — E119 Type 2 diabetes mellitus without complications: Secondary | ICD-10-CM | POA: Diagnosis not present

## 2015-10-10 ENCOUNTER — Encounter: Payer: Self-pay | Admitting: Cardiovascular Disease

## 2015-10-10 DIAGNOSIS — R0902 Hypoxemia: Secondary | ICD-10-CM | POA: Diagnosis not present

## 2015-10-10 DIAGNOSIS — I25810 Atherosclerosis of coronary artery bypass graft(s) without angina pectoris: Secondary | ICD-10-CM | POA: Diagnosis not present

## 2015-10-10 DIAGNOSIS — I4891 Unspecified atrial fibrillation: Secondary | ICD-10-CM | POA: Diagnosis not present

## 2015-10-10 DIAGNOSIS — E119 Type 2 diabetes mellitus without complications: Secondary | ICD-10-CM | POA: Diagnosis not present

## 2015-10-10 DIAGNOSIS — Z9981 Dependence on supplemental oxygen: Secondary | ICD-10-CM | POA: Diagnosis not present

## 2015-10-10 DIAGNOSIS — Z8701 Personal history of pneumonia (recurrent): Secondary | ICD-10-CM | POA: Diagnosis not present

## 2015-10-10 DIAGNOSIS — N183 Chronic kidney disease, stage 3 (moderate): Secondary | ICD-10-CM | POA: Diagnosis not present

## 2015-10-10 DIAGNOSIS — Z950 Presence of cardiac pacemaker: Secondary | ICD-10-CM | POA: Diagnosis not present

## 2015-10-10 DIAGNOSIS — I10 Essential (primary) hypertension: Secondary | ICD-10-CM | POA: Diagnosis not present

## 2015-10-14 DIAGNOSIS — Z950 Presence of cardiac pacemaker: Secondary | ICD-10-CM | POA: Diagnosis not present

## 2015-10-14 DIAGNOSIS — R0902 Hypoxemia: Secondary | ICD-10-CM | POA: Diagnosis not present

## 2015-10-14 DIAGNOSIS — E119 Type 2 diabetes mellitus without complications: Secondary | ICD-10-CM | POA: Diagnosis not present

## 2015-10-14 DIAGNOSIS — I4891 Unspecified atrial fibrillation: Secondary | ICD-10-CM | POA: Diagnosis not present

## 2015-10-14 DIAGNOSIS — Z9981 Dependence on supplemental oxygen: Secondary | ICD-10-CM | POA: Diagnosis not present

## 2015-10-14 DIAGNOSIS — N183 Chronic kidney disease, stage 3 (moderate): Secondary | ICD-10-CM | POA: Diagnosis not present

## 2015-10-14 DIAGNOSIS — I25810 Atherosclerosis of coronary artery bypass graft(s) without angina pectoris: Secondary | ICD-10-CM | POA: Diagnosis not present

## 2015-10-14 DIAGNOSIS — I10 Essential (primary) hypertension: Secondary | ICD-10-CM | POA: Diagnosis not present

## 2015-10-14 DIAGNOSIS — Z8701 Personal history of pneumonia (recurrent): Secondary | ICD-10-CM | POA: Diagnosis not present

## 2015-10-23 DIAGNOSIS — I4891 Unspecified atrial fibrillation: Secondary | ICD-10-CM | POA: Diagnosis not present

## 2015-10-23 DIAGNOSIS — Z9981 Dependence on supplemental oxygen: Secondary | ICD-10-CM | POA: Diagnosis not present

## 2015-10-23 DIAGNOSIS — R0902 Hypoxemia: Secondary | ICD-10-CM | POA: Diagnosis not present

## 2015-10-23 DIAGNOSIS — E119 Type 2 diabetes mellitus without complications: Secondary | ICD-10-CM | POA: Diagnosis not present

## 2015-10-23 DIAGNOSIS — I10 Essential (primary) hypertension: Secondary | ICD-10-CM | POA: Diagnosis not present

## 2015-10-23 DIAGNOSIS — Z950 Presence of cardiac pacemaker: Secondary | ICD-10-CM | POA: Diagnosis not present

## 2015-10-23 DIAGNOSIS — I25810 Atherosclerosis of coronary artery bypass graft(s) without angina pectoris: Secondary | ICD-10-CM | POA: Diagnosis not present

## 2015-10-23 DIAGNOSIS — N183 Chronic kidney disease, stage 3 (moderate): Secondary | ICD-10-CM | POA: Diagnosis not present

## 2015-10-23 DIAGNOSIS — Z8701 Personal history of pneumonia (recurrent): Secondary | ICD-10-CM | POA: Diagnosis not present

## 2015-10-24 DIAGNOSIS — R0902 Hypoxemia: Secondary | ICD-10-CM | POA: Diagnosis not present

## 2015-10-24 DIAGNOSIS — I4891 Unspecified atrial fibrillation: Secondary | ICD-10-CM | POA: Diagnosis not present

## 2015-10-24 DIAGNOSIS — I10 Essential (primary) hypertension: Secondary | ICD-10-CM | POA: Diagnosis not present

## 2015-10-24 DIAGNOSIS — N183 Chronic kidney disease, stage 3 (moderate): Secondary | ICD-10-CM | POA: Diagnosis not present

## 2015-10-24 DIAGNOSIS — Z950 Presence of cardiac pacemaker: Secondary | ICD-10-CM | POA: Diagnosis not present

## 2015-10-24 DIAGNOSIS — E119 Type 2 diabetes mellitus without complications: Secondary | ICD-10-CM | POA: Diagnosis not present

## 2015-10-24 DIAGNOSIS — I25810 Atherosclerosis of coronary artery bypass graft(s) without angina pectoris: Secondary | ICD-10-CM | POA: Diagnosis not present

## 2015-10-24 DIAGNOSIS — Z9981 Dependence on supplemental oxygen: Secondary | ICD-10-CM | POA: Diagnosis not present

## 2015-10-24 DIAGNOSIS — Z8701 Personal history of pneumonia (recurrent): Secondary | ICD-10-CM | POA: Diagnosis not present

## 2015-10-25 DIAGNOSIS — N183 Chronic kidney disease, stage 3 (moderate): Secondary | ICD-10-CM | POA: Diagnosis not present

## 2015-10-25 DIAGNOSIS — I10 Essential (primary) hypertension: Secondary | ICD-10-CM | POA: Diagnosis not present

## 2015-10-25 DIAGNOSIS — I25810 Atherosclerosis of coronary artery bypass graft(s) without angina pectoris: Secondary | ICD-10-CM | POA: Diagnosis not present

## 2015-10-25 DIAGNOSIS — Z9981 Dependence on supplemental oxygen: Secondary | ICD-10-CM | POA: Diagnosis not present

## 2015-10-25 DIAGNOSIS — E119 Type 2 diabetes mellitus without complications: Secondary | ICD-10-CM | POA: Diagnosis not present

## 2015-10-25 DIAGNOSIS — I4891 Unspecified atrial fibrillation: Secondary | ICD-10-CM | POA: Diagnosis not present

## 2015-10-25 DIAGNOSIS — Z950 Presence of cardiac pacemaker: Secondary | ICD-10-CM | POA: Diagnosis not present

## 2015-10-25 DIAGNOSIS — Z8701 Personal history of pneumonia (recurrent): Secondary | ICD-10-CM | POA: Diagnosis not present

## 2015-10-25 DIAGNOSIS — R0902 Hypoxemia: Secondary | ICD-10-CM | POA: Diagnosis not present

## 2015-10-28 DIAGNOSIS — Z950 Presence of cardiac pacemaker: Secondary | ICD-10-CM | POA: Diagnosis not present

## 2015-10-28 DIAGNOSIS — I25810 Atherosclerosis of coronary artery bypass graft(s) without angina pectoris: Secondary | ICD-10-CM | POA: Diagnosis not present

## 2015-10-28 DIAGNOSIS — I4891 Unspecified atrial fibrillation: Secondary | ICD-10-CM | POA: Diagnosis not present

## 2015-10-28 DIAGNOSIS — Z8701 Personal history of pneumonia (recurrent): Secondary | ICD-10-CM | POA: Diagnosis not present

## 2015-10-28 DIAGNOSIS — E119 Type 2 diabetes mellitus without complications: Secondary | ICD-10-CM | POA: Diagnosis not present

## 2015-10-28 DIAGNOSIS — R0902 Hypoxemia: Secondary | ICD-10-CM | POA: Diagnosis not present

## 2015-10-28 DIAGNOSIS — I10 Essential (primary) hypertension: Secondary | ICD-10-CM | POA: Diagnosis not present

## 2015-10-28 DIAGNOSIS — Z9981 Dependence on supplemental oxygen: Secondary | ICD-10-CM | POA: Diagnosis not present

## 2015-10-28 DIAGNOSIS — N183 Chronic kidney disease, stage 3 (moderate): Secondary | ICD-10-CM | POA: Diagnosis not present

## 2015-10-30 DIAGNOSIS — I10 Essential (primary) hypertension: Secondary | ICD-10-CM | POA: Diagnosis not present

## 2015-10-30 DIAGNOSIS — I25810 Atherosclerosis of coronary artery bypass graft(s) without angina pectoris: Secondary | ICD-10-CM | POA: Diagnosis not present

## 2015-10-30 DIAGNOSIS — Z8701 Personal history of pneumonia (recurrent): Secondary | ICD-10-CM | POA: Diagnosis not present

## 2015-10-30 DIAGNOSIS — I4891 Unspecified atrial fibrillation: Secondary | ICD-10-CM | POA: Diagnosis not present

## 2015-10-30 DIAGNOSIS — R0902 Hypoxemia: Secondary | ICD-10-CM | POA: Diagnosis not present

## 2015-10-30 DIAGNOSIS — Z950 Presence of cardiac pacemaker: Secondary | ICD-10-CM | POA: Diagnosis not present

## 2015-10-30 DIAGNOSIS — N183 Chronic kidney disease, stage 3 (moderate): Secondary | ICD-10-CM | POA: Diagnosis not present

## 2015-10-30 DIAGNOSIS — E119 Type 2 diabetes mellitus without complications: Secondary | ICD-10-CM | POA: Diagnosis not present

## 2015-10-30 DIAGNOSIS — Z9981 Dependence on supplemental oxygen: Secondary | ICD-10-CM | POA: Diagnosis not present

## 2015-10-31 DIAGNOSIS — Z950 Presence of cardiac pacemaker: Secondary | ICD-10-CM | POA: Diagnosis not present

## 2015-10-31 DIAGNOSIS — I10 Essential (primary) hypertension: Secondary | ICD-10-CM | POA: Diagnosis not present

## 2015-10-31 DIAGNOSIS — Z8701 Personal history of pneumonia (recurrent): Secondary | ICD-10-CM | POA: Diagnosis not present

## 2015-10-31 DIAGNOSIS — E119 Type 2 diabetes mellitus without complications: Secondary | ICD-10-CM | POA: Diagnosis not present

## 2015-10-31 DIAGNOSIS — N183 Chronic kidney disease, stage 3 (moderate): Secondary | ICD-10-CM | POA: Diagnosis not present

## 2015-10-31 DIAGNOSIS — I25810 Atherosclerosis of coronary artery bypass graft(s) without angina pectoris: Secondary | ICD-10-CM | POA: Diagnosis not present

## 2015-10-31 DIAGNOSIS — Z9981 Dependence on supplemental oxygen: Secondary | ICD-10-CM | POA: Diagnosis not present

## 2015-10-31 DIAGNOSIS — I4891 Unspecified atrial fibrillation: Secondary | ICD-10-CM | POA: Diagnosis not present

## 2015-10-31 DIAGNOSIS — R0902 Hypoxemia: Secondary | ICD-10-CM | POA: Diagnosis not present

## 2015-11-01 DIAGNOSIS — Z950 Presence of cardiac pacemaker: Secondary | ICD-10-CM | POA: Diagnosis not present

## 2015-11-01 DIAGNOSIS — I10 Essential (primary) hypertension: Secondary | ICD-10-CM | POA: Diagnosis not present

## 2015-11-01 DIAGNOSIS — N183 Chronic kidney disease, stage 3 (moderate): Secondary | ICD-10-CM | POA: Diagnosis not present

## 2015-11-01 DIAGNOSIS — Z9981 Dependence on supplemental oxygen: Secondary | ICD-10-CM | POA: Diagnosis not present

## 2015-11-01 DIAGNOSIS — I25810 Atherosclerosis of coronary artery bypass graft(s) without angina pectoris: Secondary | ICD-10-CM | POA: Diagnosis not present

## 2015-11-01 DIAGNOSIS — R0902 Hypoxemia: Secondary | ICD-10-CM | POA: Diagnosis not present

## 2015-11-01 DIAGNOSIS — E119 Type 2 diabetes mellitus without complications: Secondary | ICD-10-CM | POA: Diagnosis not present

## 2015-11-01 DIAGNOSIS — I4891 Unspecified atrial fibrillation: Secondary | ICD-10-CM | POA: Diagnosis not present

## 2015-11-01 DIAGNOSIS — Z8701 Personal history of pneumonia (recurrent): Secondary | ICD-10-CM | POA: Diagnosis not present

## 2015-11-04 ENCOUNTER — Ambulatory Visit (INDEPENDENT_AMBULATORY_CARE_PROVIDER_SITE_OTHER): Payer: Commercial Managed Care - HMO | Admitting: *Deleted

## 2015-11-04 ENCOUNTER — Telehealth: Payer: Self-pay | Admitting: *Deleted

## 2015-11-04 DIAGNOSIS — I25810 Atherosclerosis of coronary artery bypass graft(s) without angina pectoris: Secondary | ICD-10-CM | POA: Diagnosis not present

## 2015-11-04 DIAGNOSIS — R0902 Hypoxemia: Secondary | ICD-10-CM | POA: Diagnosis not present

## 2015-11-04 DIAGNOSIS — Z8701 Personal history of pneumonia (recurrent): Secondary | ICD-10-CM | POA: Diagnosis not present

## 2015-11-04 DIAGNOSIS — I442 Atrioventricular block, complete: Secondary | ICD-10-CM | POA: Diagnosis not present

## 2015-11-04 DIAGNOSIS — Z9981 Dependence on supplemental oxygen: Secondary | ICD-10-CM | POA: Diagnosis not present

## 2015-11-04 DIAGNOSIS — E119 Type 2 diabetes mellitus without complications: Secondary | ICD-10-CM | POA: Diagnosis not present

## 2015-11-04 DIAGNOSIS — N183 Chronic kidney disease, stage 3 (moderate): Secondary | ICD-10-CM | POA: Diagnosis not present

## 2015-11-04 DIAGNOSIS — I10 Essential (primary) hypertension: Secondary | ICD-10-CM | POA: Diagnosis not present

## 2015-11-04 DIAGNOSIS — Z950 Presence of cardiac pacemaker: Secondary | ICD-10-CM | POA: Diagnosis not present

## 2015-11-04 DIAGNOSIS — I4891 Unspecified atrial fibrillation: Secondary | ICD-10-CM | POA: Diagnosis not present

## 2015-11-04 LAB — CUP PACEART REMOTE DEVICE CHECK
Battery Impedance: 1550 Ohm
Battery Remaining Longevity: 28 mo
Battery Voltage: 2.74 V
Brady Statistic AP VP Percent: 84 %
Brady Statistic AP VS Percent: 0 %
Brady Statistic AP VS Percent: 0 %
Brady Statistic AS VP Percent: 14 %
Brady Statistic AS VS Percent: 2 %
Implantable Lead Implant Date: 20080804
Implantable Lead Implant Date: 20080804
Implantable Lead Implant Date: 20080804
Implantable Lead Location: 753859
Implantable Lead Location: 753860
Implantable Lead Location: 753860
Implantable Lead Model: 5076
Implantable Lead Model: 5076
Lead Channel Impedance Value: 432 Ohm
Lead Channel Impedance Value: 432 Ohm
Lead Channel Impedance Value: 485 Ohm
Lead Channel Impedance Value: 485 Ohm
Lead Channel Pacing Threshold Amplitude: 0.625 V
Lead Channel Pacing Threshold Amplitude: 0.625 V
Lead Channel Pacing Threshold Pulse Width: 0.4 ms
Lead Channel Pacing Threshold Pulse Width: 0.4 ms
Lead Channel Setting Pacing Amplitude: 1.875
Lead Channel Setting Pacing Amplitude: 1.875
Lead Channel Setting Pacing Amplitude: 2.5 V
Lead Channel Setting Pacing Pulse Width: 0.4 ms
Lead Channel Setting Sensing Sensitivity: 2 mV
MDC IDC LEAD IMPLANT DT: 20080804
MDC IDC LEAD LOCATION: 753859
MDC IDC MSMT BATTERY IMPEDANCE: 1550 Ohm
MDC IDC MSMT BATTERY REMAINING LONGEVITY: 28 mo
MDC IDC MSMT BATTERY VOLTAGE: 2.74 V
MDC IDC MSMT LEADCHNL RA PACING THRESHOLD AMPLITUDE: 0.875 V
MDC IDC MSMT LEADCHNL RA PACING THRESHOLD AMPLITUDE: 0.875 V
MDC IDC MSMT LEADCHNL RA PACING THRESHOLD PULSEWIDTH: 0.4 ms
MDC IDC MSMT LEADCHNL RV PACING THRESHOLD PULSEWIDTH: 0.4 ms
MDC IDC SESS DTM: 20170627141246
MDC IDC SESS DTM: 20170627141246
MDC IDC SET LEADCHNL RV PACING AMPLITUDE: 2.5 V
MDC IDC SET LEADCHNL RV PACING PULSEWIDTH: 0.4 ms
MDC IDC SET LEADCHNL RV SENSING SENSITIVITY: 2 mV
MDC IDC STAT BRADY AP VP PERCENT: 84 %
MDC IDC STAT BRADY AS VP PERCENT: 14 %
MDC IDC STAT BRADY AS VS PERCENT: 2 %

## 2015-11-04 NOTE — Progress Notes (Signed)
Remote pacemaker transmission.   

## 2015-11-04 NOTE — Telephone Encounter (Signed)
Patient and wife stopped by office this morning with past AVS. The plan listed CT head to be done. Wife inquired if he was to have a CT head today. This RN stated she would check and call her later today. This RN checked his record, called wife back and informed her that he had CT scan done on 08/25/15. She stated she had checked his record and saw that. She thanked this Therapist, sports for the call.

## 2015-11-05 ENCOUNTER — Ambulatory Visit (INDEPENDENT_AMBULATORY_CARE_PROVIDER_SITE_OTHER): Payer: Commercial Managed Care - HMO | Admitting: *Deleted

## 2015-11-05 ENCOUNTER — Encounter: Payer: Self-pay | Admitting: Cardiology

## 2015-11-05 DIAGNOSIS — Z950 Presence of cardiac pacemaker: Secondary | ICD-10-CM

## 2015-11-05 LAB — CUP PACEART REMOTE DEVICE CHECK
Battery Impedance: 1550 Ohm
Battery Voltage: 2.74 V
Brady Statistic AP VS Percent: 0 %
Date Time Interrogation Session: 20170627141246
Implantable Lead Implant Date: 20080804
Implantable Lead Model: 5076
Implantable Lead Model: 5076
Lead Channel Pacing Threshold Pulse Width: 0.4 ms
Lead Channel Pacing Threshold Pulse Width: 0.4 ms
Lead Channel Setting Pacing Amplitude: 2.5 V
Lead Channel Setting Pacing Pulse Width: 0.4 ms
Lead Channel Setting Sensing Sensitivity: 2 mV
MDC IDC LEAD IMPLANT DT: 20080804
MDC IDC LEAD LOCATION: 753859
MDC IDC LEAD LOCATION: 753860
MDC IDC MSMT BATTERY REMAINING LONGEVITY: 28 mo
MDC IDC MSMT LEADCHNL RA IMPEDANCE VALUE: 432 Ohm
MDC IDC MSMT LEADCHNL RA PACING THRESHOLD AMPLITUDE: 0.875 V
MDC IDC MSMT LEADCHNL RV IMPEDANCE VALUE: 485 Ohm
MDC IDC MSMT LEADCHNL RV PACING THRESHOLD AMPLITUDE: 0.625 V
MDC IDC SET LEADCHNL RA PACING AMPLITUDE: 1.875
MDC IDC STAT BRADY AP VP PERCENT: 84 %
MDC IDC STAT BRADY AS VP PERCENT: 14 %
MDC IDC STAT BRADY AS VS PERCENT: 2 %

## 2015-11-05 NOTE — Progress Notes (Signed)
Pacemaker check in clinic d/t ventricular noise. Ventricular sensitivity decreased from 54mv to 2.74mv. Sensing assurance turned off. ROV with Dr. Loletha Grayer 9/8. RV sensitivity to be adjusted further based on 9/8 appointment.

## 2015-11-07 DIAGNOSIS — I4891 Unspecified atrial fibrillation: Secondary | ICD-10-CM | POA: Diagnosis not present

## 2015-11-07 DIAGNOSIS — E119 Type 2 diabetes mellitus without complications: Secondary | ICD-10-CM | POA: Diagnosis not present

## 2015-11-07 DIAGNOSIS — I25810 Atherosclerosis of coronary artery bypass graft(s) without angina pectoris: Secondary | ICD-10-CM | POA: Diagnosis not present

## 2015-11-07 DIAGNOSIS — I10 Essential (primary) hypertension: Secondary | ICD-10-CM | POA: Diagnosis not present

## 2015-11-07 DIAGNOSIS — R0902 Hypoxemia: Secondary | ICD-10-CM | POA: Diagnosis not present

## 2015-11-07 DIAGNOSIS — Z9981 Dependence on supplemental oxygen: Secondary | ICD-10-CM | POA: Diagnosis not present

## 2015-11-07 DIAGNOSIS — Z8701 Personal history of pneumonia (recurrent): Secondary | ICD-10-CM | POA: Diagnosis not present

## 2015-11-07 DIAGNOSIS — N183 Chronic kidney disease, stage 3 (moderate): Secondary | ICD-10-CM | POA: Diagnosis not present

## 2015-11-07 DIAGNOSIS — Z950 Presence of cardiac pacemaker: Secondary | ICD-10-CM | POA: Diagnosis not present

## 2015-11-20 DIAGNOSIS — E782 Mixed hyperlipidemia: Secondary | ICD-10-CM | POA: Diagnosis not present

## 2015-11-20 DIAGNOSIS — C61 Malignant neoplasm of prostate: Secondary | ICD-10-CM | POA: Diagnosis not present

## 2015-11-20 DIAGNOSIS — E1165 Type 2 diabetes mellitus with hyperglycemia: Secondary | ICD-10-CM | POA: Diagnosis not present

## 2015-11-22 ENCOUNTER — Other Ambulatory Visit: Payer: Self-pay | Admitting: Cardiovascular Disease

## 2015-11-24 DIAGNOSIS — E1169 Type 2 diabetes mellitus with other specified complication: Secondary | ICD-10-CM | POA: Diagnosis not present

## 2015-11-24 DIAGNOSIS — C61 Malignant neoplasm of prostate: Secondary | ICD-10-CM | POA: Diagnosis not present

## 2015-11-24 DIAGNOSIS — E1122 Type 2 diabetes mellitus with diabetic chronic kidney disease: Secondary | ICD-10-CM | POA: Diagnosis not present

## 2015-11-24 DIAGNOSIS — I2510 Atherosclerotic heart disease of native coronary artery without angina pectoris: Secondary | ICD-10-CM | POA: Diagnosis not present

## 2015-11-24 DIAGNOSIS — E782 Mixed hyperlipidemia: Secondary | ICD-10-CM | POA: Diagnosis not present

## 2015-11-24 DIAGNOSIS — E1165 Type 2 diabetes mellitus with hyperglycemia: Secondary | ICD-10-CM | POA: Diagnosis not present

## 2015-11-24 DIAGNOSIS — N183 Chronic kidney disease, stage 3 (moderate): Secondary | ICD-10-CM | POA: Diagnosis not present

## 2015-11-24 DIAGNOSIS — E559 Vitamin D deficiency, unspecified: Secondary | ICD-10-CM | POA: Diagnosis not present

## 2015-11-24 NOTE — Telephone Encounter (Signed)
Rx(s) sent to pharmacy electronically.  

## 2015-12-10 DIAGNOSIS — C44319 Basal cell carcinoma of skin of other parts of face: Secondary | ICD-10-CM | POA: Diagnosis not present

## 2015-12-10 DIAGNOSIS — L570 Actinic keratosis: Secondary | ICD-10-CM | POA: Diagnosis not present

## 2015-12-10 DIAGNOSIS — D485 Neoplasm of uncertain behavior of skin: Secondary | ICD-10-CM | POA: Diagnosis not present

## 2015-12-25 DIAGNOSIS — Z0100 Encounter for examination of eyes and vision without abnormal findings: Secondary | ICD-10-CM | POA: Diagnosis not present

## 2015-12-25 DIAGNOSIS — E119 Type 2 diabetes mellitus without complications: Secondary | ICD-10-CM | POA: Diagnosis not present

## 2015-12-30 ENCOUNTER — Encounter: Payer: Self-pay | Admitting: Diagnostic Neuroimaging

## 2015-12-30 ENCOUNTER — Ambulatory Visit (INDEPENDENT_AMBULATORY_CARE_PROVIDER_SITE_OTHER): Payer: Commercial Managed Care - HMO | Admitting: Diagnostic Neuroimaging

## 2015-12-30 VITALS — BP 108/56 | HR 55 | Wt 193.6 lb

## 2015-12-30 DIAGNOSIS — E1142 Type 2 diabetes mellitus with diabetic polyneuropathy: Secondary | ICD-10-CM | POA: Diagnosis not present

## 2015-12-30 DIAGNOSIS — R269 Unspecified abnormalities of gait and mobility: Secondary | ICD-10-CM | POA: Diagnosis not present

## 2015-12-30 DIAGNOSIS — G20 Parkinson's disease: Secondary | ICD-10-CM

## 2015-12-30 MED ORDER — CARBIDOPA-LEVODOPA 25-100 MG PO TABS: 1.0000 | ORAL_TABLET | Freq: Three times a day (TID) | ORAL | 4 refills | Status: DC

## 2015-12-30 NOTE — Progress Notes (Signed)
[facility]  PATIENT: [name] Powell DOB: [birth year]  REFERRING CLINICIAN: Edwin Dada, MD HISTORY FROM: patient, wife REASON FOR VISIT: follow up   HISTORICAL  CHIEF COMPLAINT:  Chief Complaint  Patient presents with  . Other    rm 7, Parkinson's Disease, wife- Inez Catalina, "doing good on the medication"  . Follow-up    3 month    HISTORY OF PRESENT ILLNESS:   UPDATE 12/30/15: Since last visit tried coming off carb.levo, and then had worsening mobility and energy. Now back on carb/levo and doing better.   UPDATE 09/25/15: Since last visit, doing better, except for hosp stay for pneumonia in April 2017. Patient doesn't feel carb/levo is helping, but family think he has more energy now. PT also helping. Now on home O2 since past 1 week.  PRIOR HPI (08/13/15): 82 year old right-handed male here for evaluation of gait instability, memory loss, frequent falling.  For past 2 years patient has had increasing problems with balance, walking, coordination and increasing falls. Over the past 3 weeks patient has had generalized weakness, walking on toes, increasing shuffling gait. Patient also having problems with short-term memory, confusion, difficulty with tasks such as crossword puzzles which he used to be able to do easily. Patient is a retired Art gallery manager and was very high functioning from intellectual standpoint. Patient has had significant decline from his previous level of functioning. Patient has developed quiet hoarse voice, word finding difficulties, decreased facial expressions. In general he has slowed down quite a bit.  Patient also has constipation and urinary incontinence. He has tendency to punch and kick in his sleep and act out his dreams although this is been going on for more than 25 years according to patient's wife. No change in sense of smell or taste.   REVIEW OF SYSTEMS: Full 14 system review of systems performed and negative with exception of: only as  per HPI.    ALLERGIES: Allergies  Allergen Reactions  . Mexitil [Mexiletine]     unknown  . Lanoxin [Digoxin] Rash    Eyelid rash    HOME MEDICATIONS: Outpatient Medications Prior to Visit  Medication Sig Dispense Refill  . apixaban (ELIQUIS) 5 MG TABS tablet TAKE 1 TABLET (5 MG TOTAL) BY MOUTH 2 (TWO) TIMES DAILY. 30 tablet 1  . atorvastatin (LIPITOR) 20 MG tablet Take 20 mg by mouth daily.    . benzonatate (TESSALON) 100 MG capsule Take 1 capsule by mouth 3 (three) times daily. For 10 days.    . carbidopa-levodopa (SINEMET IR) 25-100 MG tablet 25-100 tablets.    . Cholecalciferol (VITAMIN D PO) Take 5,000 Units by mouth 2 (two) times a week. On Friday    . furosemide (LASIX) 20 MG tablet Take 1 tablet (20 mg total) by mouth daily. 90 tablet 3  . glimepiride (AMARYL) 4 MG tablet Take 4 mg by mouth daily before breakfast.    . JANUVIA 50 MG tablet Take 1 tablet by mouth daily.    . metFORMIN (GLUCOPHAGE-XR) 500 MG 24 hr tablet Take 500 mg by mouth daily.    . metoprolol (LOPRESSOR) 50 MG tablet Take 2 tablets (100mg ) by mouth in the morning and 1 tablet (50mg ) in the evening. 270 tablet 2  . mometasone (ELOCON) 0.1 % cream APPLY TO LEGS AS NEEDED FOR FOOT DERMATITIS  0  . Multiple Vitamins-Minerals (PRESERVISION AREDS) CAPS Take 1 capsule by mouth 2 (two) times daily.    . polyethylene glycol (MIRALAX / GLYCOLAX) packet Take 17 g by  mouth daily. To prevent constipation    . valsartan (DIOVAN) 160 MG tablet Take 1 tablet (160 mg total) by mouth daily. 90 tablet 2  . potassium chloride (K-DUR,KLOR-CON) 10 MEQ tablet Take 0.5 tablets (5 mEq total) by mouth daily. 45 tablet 3   No facility-administered medications prior to visit.     PAST MEDICAL HISTORY: Past Medical History:  Diagnosis Date  . "Walking corpse" syndrome   . Atrial fibrillation ([place])   . Cancer ([place])   . CHB (complete heart block) ([place])   . Coronary artery disease   . Diabetes mellitus   . Dyslipidemia   . H/O  prostate cancer   . Hypertension   . New onset atrial flutter, persistent 07/04/2014  . Pacemaker 12/12/2006   Medtronic adapta  . Pleural effusion, left   . Pneumonia 09/2015  . Prostate cancer ([place])   . 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 Ventricular tachycardia (paroxysmal) ([place]) 03/28/2014    PAST SURGICAL HISTORY: Past Surgical History:  Procedure Laterality Date  . CORONARY ARTERY BYPASS GRAFT  09/04/2001   LIMA to LAD,SVG to diagonal,SVG to ramus intermedius,SVG to PDA  . NM MYOVIEW LTD  01/09/2010   no ischemia  . PACEMAKER INSERTION  12/12/2006   Medtronic adapta  . PROSTATE SURGERY  2001   cancer  . TONSILLECTOMY      FAMILY HISTORY: Family History  Problem Relation Age of Onset  . Heart disease Mother     SOCIAL HISTORY:  Social History   Social History  . Marital status: Married    Spouse name: Inez Catalina  . Number of children: 2  . Years of education: 16   Occupational History  .      retired Art gallery manager   Social History Main Topics  . Smoking status: Former Smoker    Types: Cigarettes, Cigars    Quit date: 05/14/1962  . Smokeless tobacco: Never Used  . Alcohol use Yes     Comment: seldom  . Drug use: No  . Sexual activity: Not on file   Other Topics Concern  . Not on file   Social History Narrative   Lives at home with wife   Caffeine -seldom     PHYSICAL EXAM  GENERAL EXAM/CONSTITUTIONAL: Vitals:  Vitals:   12/30/15 1022  BP: (!) 108/56  Pulse: (!) 55  Weight: 193 lb 9.6 oz (87.8 kg)   Body mass index is 29.44 kg/m. No exam data present  Patient is in no distress; well developed, nourished and groomed; neck is supple  CARDIOVASCULAR:  Examination of carotid arteries is normal; no carotid bruits  Regular rate and rhythm, no murmurs  Examination of peripheral vascular system by observation and palpation is normal  EYES:  Ophthalmoscopic exam of optic discs and  posterior segments is normal; no papilledema or hemorrhages  MUSCULOSKELETAL:  Gait, strength, tone, movements noted in Neurologic exam below  NEUROLOGIC: MENTAL STATUS:  MMSE - [place] Exam 08/13/2015  Orientation to time 5  Orientation to Place 5  Registration 3  Attention/ Calculation 2  Recall 2  Language- name 2 objects 2  Language- repeat 1  Language- follow 3 step command 3  Language- read & follow direction 1  Write a sentence 1  Copy design 1  Total score 26    awake, alert, oriented to person, place and time  recent and remote memory intact  normal attention and  concentration  language fluent, comprehension intact, naming intact,   fund of knowledge appropriate  CRANIAL NERVE:   2nd - no papilledema on fundoscopic exam  2nd, 3rd, 4th, 6th - pupils equal and reactive to light, visual fields full to confrontation, extraocular muscles intact, no nystagmus  5th - facial sensation symmetric  7th - facial strength symmetric  8th - hearing intact  9th - palate elevates symmetrically, uvula midline  11th - shoulder shrug symmetric  12th - tongue protrusion midline  HOARSE VOICE  MASKED FACIES  MOTOR:   normal bulk  MODERATE COGWHEELING RIGIDITY IN BUE  MODERATE BRADYKINESIA IN BLE  full strength in the BUE, BLE  SENSORY:   normal and symmetric to light touch, temperature  DECR VIB AT TOES AND ANKLES  COORDINATION:   finger-nose-finger, fine finger movements normal  REFLEXES:   deep tendon reflexes TRACE IN BUE; ABSENT IN BLE  POSITIVE MYERSONS  NEGATIVE SNOUT  GAIT/STATION:   narrow based gait; SLOW TO RISE; STOOPED POSTURE; DECR ARM SWING; SHORT STEPS; EN BLOC TURNING; USES SINGLE POINT CANE    DIAGNOSTIC DATA (LABS, IMAGING, TESTING) - I reviewed patient records, labs, notes, testing and imaging myself where available.  Lab Results  Component Value Date   WBC 7.4 09/18/2015   HGB 8.5 (L) 09/18/2015   HCT 27.7  (L) 09/18/2015   MCV 95.8 09/18/2015   PLT 200 09/18/2015      Component Value Date/Time   NA 140 10/07/2015 1223   K 4.8 10/07/2015 1223   CL 104 10/07/2015 1223   CO2 27 10/07/2015 1223   GLUCOSE 155 (H) 10/07/2015 1223   BUN 25 10/07/2015 1223   CREATININE 1.16 (H) 10/07/2015 1223   CALCIUM 8.8 10/07/2015 1223   PROT 7.0 09/15/2015 1243   ALBUMIN 3.1 (L) 09/15/2015 1243   AST 21 09/15/2015 1243   ALT 6 (L) 09/15/2015 1243   ALKPHOS 95 09/15/2015 1243   BILITOT 0.9 09/15/2015 1243   GFRNONAA 42 (L) 09/18/2015 0301   GFRAA 49 (L) 09/18/2015 0301   No results found for: CHOL, HDL, LDLCALC, LDLDIRECT, TRIG, CHOLHDL Lab Results  Component Value Date   HGBA1C 8.6 (H) 09/15/2015   No results found for: VITAMINB12 No results found for: TSH  10/13/11 CT head [I reviewed images myself and agree with interpretation. -VRP]  1. No acute intracranial or calvarial findings. 2. Left frontal scalp soft tissue swelling.  12/11/13 TTE - Normal LV size with moderate LV hypertrophy. EF 55% with septal-lateral dyssynchrony probably from pacing and apicalhypokinesis. Mildly dilated RV wtih mildly decreased systolicfunction.  08/25/15 CT head [I reviewed images myself and agree with interpretation. -VRP]  1. Moderate cortical atrophy, essentially unchanged when compared to the CT scan from 10/13/2011. 2. Moderate hypodense changes in the white matter of both hemispheres consistent with chronic microvascular ischemic change, essentially unchanged when compared to the 2013 CT. 3. There are no acute findings.    ASSESSMENT AND PLAN  82 y.o. year old male here with gradual onset progressive balance and gait difficulty, short steps, shuffling gait, masked facies, hoarse voice, word finding difficulties, short-term memory loss, decreased cognitive ability and generalized weakness. Signs and symptoms are suspicious for neurodegenerative process such as Parkinson's disease. Patient also has  signs of diabetic neuropathy may be contributed factors well.    Ddx: parkinsonism (idiopathic vs vascular) + diabetic neuropathy  1. Parkinson's disease ([place])   2. Gait difficulty   3. Diabetic polyneuropathy associated with type 2 diabetes mellitus (  [place])      PLAN: - use rollator walker - continue physical therapy exercises - continue carbidopa-levodopa  Meds ordered this encounter  Medications  . carbidopa-levodopa (SINEMET IR) 25-100 MG tablet    Sig: Take 1 tablet by mouth 3 (three) times daily.    Dispense:  270 tablet    Refill:  4   Return in about 6 months (around 07/01/2016).    [name], MD [id number], [id number] AM Certified in Neurology, Neurophysiology and Neuroimaging  [facility] [address], [place], [place] [address] [phone]

## 2015-12-30 NOTE — Patient Instructions (Signed)
-   continue carbidopa/levodopa  - use cane and rollator walker  - stay actve

## 2016-01-16 ENCOUNTER — Encounter: Payer: Self-pay | Admitting: Cardiovascular Disease

## 2016-01-16 ENCOUNTER — Ambulatory Visit (INDEPENDENT_AMBULATORY_CARE_PROVIDER_SITE_OTHER): Payer: Commercial Managed Care - HMO | Admitting: Cardiovascular Disease

## 2016-01-16 VITALS — BP 123/72 | HR 71 | Ht 68.0 in | Wt 192.0 lb

## 2016-01-16 DIAGNOSIS — I4729 Other ventricular tachycardia: Secondary | ICD-10-CM

## 2016-01-16 DIAGNOSIS — I442 Atrioventricular block, complete: Secondary | ICD-10-CM | POA: Diagnosis not present

## 2016-01-16 DIAGNOSIS — Z950 Presence of cardiac pacemaker: Secondary | ICD-10-CM | POA: Diagnosis not present

## 2016-01-16 DIAGNOSIS — I481 Persistent atrial fibrillation: Secondary | ICD-10-CM | POA: Diagnosis not present

## 2016-01-16 DIAGNOSIS — I472 Ventricular tachycardia: Secondary | ICD-10-CM

## 2016-01-16 DIAGNOSIS — Z7901 Long term (current) use of anticoagulants: Secondary | ICD-10-CM | POA: Insufficient documentation

## 2016-01-16 DIAGNOSIS — E785 Hyperlipidemia, unspecified: Secondary | ICD-10-CM

## 2016-01-16 DIAGNOSIS — I5032 Chronic diastolic (congestive) heart failure: Secondary | ICD-10-CM

## 2016-01-16 DIAGNOSIS — I25118 Atherosclerotic heart disease of native coronary artery with other forms of angina pectoris: Secondary | ICD-10-CM

## 2016-01-16 DIAGNOSIS — I4819 Other persistent atrial fibrillation: Secondary | ICD-10-CM

## 2016-01-16 NOTE — Patient Instructions (Signed)
Dr Sallyanne Kuster recommends that you continue on your current medications as directed. Please refer to the Current Medication list given to you today.  Remote monitoring is used to monitor your Pacemaker of ICD from home. This monitoring reduces the number of office visits required to check your device to one time per year. It allows [place] to keep an eye on the functioning of your device to ensure it is working properly. You are scheduled for a device check from home on Friday, December 8th, 2017. You may send your transmission at any time that day. If you have a wireless device, the transmission will be sent automatically. After your physician reviews your transmission, you will receive a postcard with your next transmission date.  Dr Sallyanne Kuster recommends that you schedule a follow-up appointment in 6 months with a pacemaker check. You will receive a reminder letter in the mail two months in advance. If you don't receive a letter, please call our office to schedule the follow-up appointment.  If you need a refill on your cardiac medications before your next appointment, please call your pharmacy.

## 2016-01-16 NOTE — Progress Notes (Signed)
Patient ID: [name] Powell, male   DOB: 1923-03-24, 82 y.o.   MRN: [id number]    Cardiology Office Note    Date:  01/16/2016   ID:  [name] Powell [birth year], MRN [id number]  PCP:  [name], MD  Cardiologist:   [name], MD   Chief Complaint  Patient presents with  . Follow-up    History of Present Illness:  [name] Powell is a 82 y.o. male with CAD s/p CABG, sinus arrest and complete heart block (pacemaker dependent), Current episodes of paroxysmal atrial fibrillation and atrial flutter, chronic diastolic heart failure and recently diagnosed Parkinson's disease. He has a dual-chamber permanent pacemaker implanted in 2008 (Medtronic Adapta) and is pacemaker dependent.  He has had 1 fall where he skinned his knee slightly. Otherwise he is steady on his feet and very cautious. He has started treatment for Parkinson's disease but remains very sedentary.  Interrogation of his dual-chamber permanent pacemaker (Medtronic Adapta implanted 2008, estimated generator longevity 2.0 years) shows ongoing persistent atrial fibrillation and ventricular paced rhythm. . He has 86% atrial pacing and 98.5% ventricular pacing. He overall burden of atrial mode switch is about 49%, with episodes lasting frequently for several consecutive days.. He has occasional episodes of high ventricular rate consistent with nonsustained ventricular tachycardia. Only one episode of nonsustained VT has occurred since his last device check.  14 years have passed since his CABG surgery. His last nuclear study in August 2015 was low risk (small apical scar, no ischemia, EF 44%). Echo August 2015 confirmed apical akinesis, but EF was estimated at 55%. He has complete heart block and sinus node arrest and is pacemaker dependent (>80% A paced, 100% V paced).  His dual-chamber device (Medtronic) was implanted in August 2008. He had 20-beat nonsustained VT recorded in September 2015. 30 minute episode of  paroxysmal atrial tach in November 2015. In January 2016 he developed persistent atrial flutter and this appeared to trigger worsened CHF. Attempted overdrive pacing of the arrhythmia failed. He is very sensitive to programming changes. He feels better at lower rate limit programmed at 60 rather than 70 bpm. He has treated coronary risk factors (hypertension, hyperlipidemia, diabetes mellitus type 2 on oral antidiabetics).   Past Medical History:  Diagnosis Date  . Atrial fibrillation ([place])   . Cancer ([place])   . CHB (complete heart block) ([place])   . Coronary artery disease   . Diabetes mellitus   . Dyslipidemia   . H/O prostate cancer   . Hypertension   . New onset atrial flutter, persistent 07/04/2014  . Pacemaker 12/12/2006   Medtronic adapta  . Pleural effusion, left   . Pneumonia 09/2015  . Prostate cancer ([place])   . 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 Ventricular tachycardia (paroxysmal) ([place]) 03/28/2014    Past Surgical History:  Procedure Laterality Date  . CORONARY ARTERY BYPASS GRAFT  09/04/2001   LIMA to LAD,SVG to diagonal,SVG to ramus intermedius,SVG to PDA  . NM MYOVIEW LTD  01/09/2010   no ischemia  . PACEMAKER INSERTION  12/12/2006   Medtronic adapta  . PROSTATE SURGERY  2001   cancer  . TONSILLECTOMY      Current Medications: Outpatient Medications Prior to Visit  Medication Sig Dispense Refill  . apixaban (ELIQUIS) 5 MG TABS tablet TAKE 1 TABLET (5 MG TOTAL) BY MOUTH 2 (TWO) TIMES DAILY. 30 tablet 1  . atorvastatin (LIPITOR) 20 MG  tablet Take 20 mg by mouth daily.    . benzonatate (TESSALON) 100 MG capsule Take 1 capsule by mouth 3 (three) times daily. For 10 days.    . carbidopa-levodopa (SINEMET IR) 25-100 MG tablet Take 1 tablet by mouth 3 (three) times daily. 270 tablet 4  . Cholecalciferol (VITAMIN D PO) Take 5,000 Units by mouth 2 (two) times a week. On Friday    . furosemide (LASIX) 20 MG tablet Take 1  tablet (20 mg total) by mouth daily. 90 tablet 3  . glimepiride (AMARYL) 4 MG tablet Take 4 mg by mouth daily before breakfast.    . JANUVIA 50 MG tablet Take 1 tablet by mouth daily.    . metFORMIN (GLUCOPHAGE-XR) 500 MG 24 hr tablet Take 500 mg by mouth daily.    . metoprolol (LOPRESSOR) 50 MG tablet Take 2 tablets (100mg ) by mouth in the morning and 1 tablet (50mg ) in the evening. 270 tablet 2  . mometasone (ELOCON) 0.1 % cream APPLY TO LEGS AS NEEDED FOR FOOT DERMATITIS  0  . Multiple Vitamins-Minerals (PRESERVISION AREDS) CAPS Take 1 capsule by mouth 2 (two) times daily.    . polyethylene glycol (MIRALAX / GLYCOLAX) packet Take 17 g by mouth daily. To prevent constipation    . potassium chloride (K-DUR) 10 MEQ tablet 10 mEq. Tues, Thurs,Sat , Sunday    . valsartan (DIOVAN) 160 MG tablet Take 1 tablet (160 mg total) by mouth daily. 90 tablet 2   No facility-administered medications prior to visit.      Allergies:   Mexitil [mexiletine] and Lanoxin [digoxin]   Social History   Social History  . Marital status: Married    Spouse name: Inez Catalina  . Number of children: 2  . Years of education: 16   Occupational History  .      retired Art gallery manager   Social History Main Topics  . Smoking status: Former Smoker    Types: Cigarettes, Cigars    Quit date: 05/14/1962  . Smokeless tobacco: Never Used  . Alcohol use Yes     Comment: seldom  . Drug use: No  . Sexual activity: Not Asked   Other Topics Concern  . None   Social History Narrative   Lives at home with wife   Caffeine -seldom     Family History:  The patient's family history includes Heart disease in his mother.   ROS:   Please see the history of present illness.    ROS All other systems reviewed and are negative.   PHYSICAL EXAM:   VS:  BP 123/72   Pulse 71   Ht 5\' 8"  (1.727 m)   Wt 192 lb (87.1 kg)   SpO2 97%   BMI 29.19 kg/m    GEN: Well nourished, well developed, in no acute distress  HEENT: normal   Neck: no JVD, carotid bruits, or masses Cardiac: RRR; no murmurs, rubs, or gallops,no edema  Respiratory:  clear to auscultation bilaterally, normal work of breathing GI: soft, nontender, nondistended, + BS MS: no deformity or atrophy  Skin: warm and dry, no rash Neuro:  Alert and Oriented x 3, Strength and sensation are intact. There is some hesitancy initiation of movements, responds to questions and some dulling of facial expressions, all suggestive of parkinsonism Psych: euthymic mood, full affect  Wt Readings from Last 3 Encounters:  01/16/16 192 lb (87.1 kg)  12/30/15 193 lb 9.6 oz (87.8 kg)  09/25/15 193 lb (87.5 kg)  Studies/Labs Reviewed:   EKG:  EKG is ordered today.  The ekg ordered today demonstrates Atrial ventricular sequential pacing, one PAC  Recent Labs: 09/15/2015: ALT 6 09/18/2015: Hemoglobin 8.5; Platelets 200 10/07/2015: Brain Natriuretic Peptide 329.2; BUN 25; Creat 1.16; Potassium 4.8; Sodium 140    ASSESSMENT:    1. CHB (complete heart block) ([facility])   2. Pacemaker   3. Ventricular tachycardia (paroxysmal) ([facility])   4. Persistent atrial fibrillation ([place])   5. Long term current use of anticoagulant   6. Coronary artery disease involving native coronary artery of native heart with other form of angina pectoris ([place])   7. Chronic diastolic congestive heart failure ([place])   8. Dyslipidemia      PLAN:  In order of problems listed above:  1. CHB:  Pacemaker dependent 2. PPM: Normal pacemaker function with the exception of occasional atrial under sensing, not causing any serious interference with device function. He is very sensitive to programming changes. He feels better at lower rate limit programmed at 60 rather than 70 bpm. 3. NSVT: Episodes of ventricular tachycardia remain brief, very infrequent and asymptomatic. He is on beta blocker therapy, which should not be increased since he has problems with orthostatic hypotension. 4. AFib: Current  estimation of 50% is probably closer to true burden arrhythmia, previously underestimated due to atrial under sensing. He should continue anticoagulation as long as this is safe. Had one minor fall, usually is very steady on his feet.  5. He has not had bleeding complications. CHADSVasc at least 5. 6. CAD s/p CABG: Asymptomatic. Relatively recent low risk nuclear stress test without ischemia. On statin, beta blocker, not taking aspirin due to concomitant anticoagulation 7. CHF: Most recent echocardiogram shows left ventricular ejection fraction 55% with RV pacing-induced dyssynchrony. Sedentary status precludes assessment of functional status, but clinically appears euvolemic. Orthostatic hypotension has been less prominent since reduce his diuretic dose. 8. HLP on statin. Recheck lipids at next appointment   Medication Adjustments/Labs and Tests Ordered: Current medicines are reviewed at length with the patient today.  Concerns regarding medicines are outlined above.  Medication changes, Labs and Tests ordered today are listed in the Patient Instructions below. Patient Instructions  Dr Sallyanne Kuster recommends that you continue on your current medications as directed. Please refer to the Current Medication list given to you today.  Remote monitoring is used to monitor your Pacemaker of ICD from home. This monitoring reduces the number of office visits required to check your device to one time per year. It allows [place] to keep an eye on the functioning of your device to ensure it is working properly. You are scheduled for a device check from home on Friday, December 8th, 2017. You may send your transmission at any time that day. If you have a wireless device, the transmission will be sent automatically. After your physician reviews your transmission, you will receive a postcard with your next transmission date.  Dr Sallyanne Kuster recommends that you schedule a follow-up appointment in 6 months with a pacemaker  check. You will receive a reminder letter in the mail two months in advance. If you don't receive a letter, please call our office to schedule the follow-up appointment.  If you need a refill on your cardiac medications before your next appointment, please call your pharmacy.    Signed, [name], MD  01/16/2016 9:41 AM    [facility] [address], [place], [place]  [address] Phone: [phone]; Fax: [phone]

## 2016-01-20 LAB — CUP PACEART INCLINIC DEVICE CHECK
Battery Remaining Longevity: 24 mo
Battery Voltage: 2.74 V
Brady Statistic AP VP Percent: 85 %
Brady Statistic AS VS Percent: 1 %
Implantable Lead Implant Date: 20080804
Implantable Lead Location: 753860
Lead Channel Impedance Value: 462 Ohm
Lead Channel Impedance Value: 478 Ohm
Lead Channel Pacing Threshold Amplitude: 0.5 V
Lead Channel Setting Pacing Amplitude: 1.875
Lead Channel Setting Pacing Amplitude: 2.5 V
Lead Channel Setting Sensing Sensitivity: 2.8 mV
MDC IDC LEAD IMPLANT DT: 20080804
MDC IDC LEAD LOCATION: 753859
MDC IDC MSMT BATTERY IMPEDANCE: 1732 Ohm
MDC IDC MSMT LEADCHNL RA PACING THRESHOLD AMPLITUDE: 0.875 V
MDC IDC MSMT LEADCHNL RA PACING THRESHOLD PULSEWIDTH: 0.4 ms
MDC IDC MSMT LEADCHNL RV PACING THRESHOLD PULSEWIDTH: 0.4 ms
MDC IDC SESS DTM: 20170908114044
MDC IDC SET LEADCHNL RV PACING PULSEWIDTH: 0.4 ms
MDC IDC STAT BRADY AP VS PERCENT: 0 %
MDC IDC STAT BRADY AS VP PERCENT: 13 %

## 2016-01-21 ENCOUNTER — Encounter: Payer: Self-pay | Admitting: Cardiovascular Disease

## 2016-01-22 DIAGNOSIS — L986 Other infiltrative disorders of the skin and subcutaneous tissue: Secondary | ICD-10-CM | POA: Diagnosis not present

## 2016-01-22 DIAGNOSIS — C44319 Basal cell carcinoma of skin of other parts of face: Secondary | ICD-10-CM | POA: Diagnosis not present

## 2016-02-03 ENCOUNTER — Encounter: Payer: Commercial Managed Care - HMO | Admitting: Cardiovascular Disease

## 2016-02-20 DIAGNOSIS — E1165 Type 2 diabetes mellitus with hyperglycemia: Secondary | ICD-10-CM | POA: Diagnosis not present

## 2016-02-20 DIAGNOSIS — E782 Mixed hyperlipidemia: Secondary | ICD-10-CM | POA: Diagnosis not present

## 2016-02-23 ENCOUNTER — Telehealth: Payer: Self-pay | Admitting: Cardiovascular Disease

## 2016-02-23 MED ORDER — METOPROLOL TARTRATE 50 MG PO TABS: ORAL_TABLET | ORAL | 1 refills | Status: DC

## 2016-02-23 MED ORDER — FUROSEMIDE 20 MG PO TABS: 20.0000 mg | ORAL_TABLET | Freq: Every day | ORAL | 1 refills | Status: DC

## 2016-02-23 MED ORDER — APIXABAN 5 MG PO TABS: ORAL_TABLET | ORAL | 1 refills | Status: DC

## 2016-02-23 MED ORDER — POTASSIUM CHLORIDE ER 10 MEQ PO TBCR: 10.0000 meq | EXTENDED_RELEASE_TABLET | Freq: Every day | ORAL | 1 refills | Status: DC

## 2016-02-23 NOTE — Telephone Encounter (Signed)
Pt needs a RX refill Medication name:Fursomide needs a 3 months supply  Dosage:20mg   How much he has left:Only has enough to last for a week  [facility] [address]

## 2016-02-23 NOTE — Telephone Encounter (Signed)
Called patient's wife back. She clarified that the furosemide he's been taking is 40mg  4x a week, 20mg  3x a week. He also needed refills sent to local pharmacy for this and a week's supply of Eliquis and metoprolol. In addition, rx(s) for eliquis, metoprolol, and potassium for a 90 day supply each were submitted to mail order at wife's request. Informed her that all medication refilled to pharmacies as requested. She voiced thanks and stated no further needs at this time.

## 2016-02-24 DIAGNOSIS — N183 Chronic kidney disease, stage 3 (moderate): Secondary | ICD-10-CM | POA: Diagnosis not present

## 2016-02-24 DIAGNOSIS — E1169 Type 2 diabetes mellitus with other specified complication: Secondary | ICD-10-CM | POA: Diagnosis not present

## 2016-02-24 DIAGNOSIS — E1165 Type 2 diabetes mellitus with hyperglycemia: Secondary | ICD-10-CM | POA: Diagnosis not present

## 2016-02-24 DIAGNOSIS — E559 Vitamin D deficiency, unspecified: Secondary | ICD-10-CM | POA: Diagnosis not present

## 2016-02-24 DIAGNOSIS — Z23 Encounter for immunization: Secondary | ICD-10-CM | POA: Diagnosis not present

## 2016-02-24 DIAGNOSIS — C61 Malignant neoplasm of prostate: Secondary | ICD-10-CM | POA: Diagnosis not present

## 2016-02-24 DIAGNOSIS — E1122 Type 2 diabetes mellitus with diabetic chronic kidney disease: Secondary | ICD-10-CM | POA: Diagnosis not present

## 2016-02-24 DIAGNOSIS — E782 Mixed hyperlipidemia: Secondary | ICD-10-CM | POA: Diagnosis not present

## 2016-02-24 DIAGNOSIS — I2510 Atherosclerotic heart disease of native coronary artery without angina pectoris: Secondary | ICD-10-CM | POA: Diagnosis not present

## 2016-03-03 DIAGNOSIS — L570 Actinic keratosis: Secondary | ICD-10-CM | POA: Diagnosis not present

## 2016-03-03 DIAGNOSIS — D0439 Carcinoma in situ of skin of other parts of face: Secondary | ICD-10-CM | POA: Diagnosis not present

## 2016-03-03 DIAGNOSIS — D044 Carcinoma in situ of skin of scalp and neck: Secondary | ICD-10-CM | POA: Diagnosis not present

## 2016-03-04 DIAGNOSIS — D0439 Carcinoma in situ of skin of other parts of face: Secondary | ICD-10-CM | POA: Diagnosis not present

## 2016-03-04 DIAGNOSIS — D044 Carcinoma in situ of skin of scalp and neck: Secondary | ICD-10-CM | POA: Diagnosis not present

## 2016-03-04 DIAGNOSIS — D485 Neoplasm of uncertain behavior of skin: Secondary | ICD-10-CM | POA: Diagnosis not present

## 2016-03-15 ENCOUNTER — Telehealth: Payer: Self-pay | Admitting: Cardiovascular Disease

## 2016-03-15 MED ORDER — FUROSEMIDE 20 MG PO TABS: 20.0000 mg | ORAL_TABLET | Freq: Every day | ORAL | 1 refills | Status: DC

## 2016-03-15 NOTE — Telephone Encounter (Signed)
New Message  Pt c/o medication issue:  1. Name of Medication:  furosemide  2. How are you currently taking this medication (dosage and times per day)?  furosemide 20 mg tablet, 40 mg Tues Thur Sat  3. Are you having a reaction (difficulty breathing--STAT)? No  4. What is your medication issue? Wouldn't go into details. Please f/u with pt

## 2016-03-15 NOTE — Telephone Encounter (Signed)
Returned call to patient's wife. Spoke to her regarding call in, which was to request refills for patient's adjusted lasix dose. Confirmed instructions as given and sent 90 day supply to [facility] and [facility] [address] at caller request.  She is aware I have sent these, voiced thanks, will call back if further needs or concerns.

## 2016-03-19 ENCOUNTER — Other Ambulatory Visit: Payer: Self-pay

## 2016-03-19 MED ORDER — FUROSEMIDE 20 MG PO TABS: ORAL_TABLET | ORAL | 2 refills | Status: DC

## 2016-04-16 ENCOUNTER — Ambulatory Visit (INDEPENDENT_AMBULATORY_CARE_PROVIDER_SITE_OTHER): Payer: Commercial Managed Care - HMO | Admitting: *Deleted

## 2016-04-16 DIAGNOSIS — I442 Atrioventricular block, complete: Secondary | ICD-10-CM | POA: Diagnosis not present

## 2016-04-19 NOTE — Progress Notes (Signed)
Remote pacemaker transmission.   

## 2016-04-21 ENCOUNTER — Encounter: Payer: Self-pay | Admitting: Cardiology

## 2016-04-29 ENCOUNTER — Other Ambulatory Visit: Payer: Self-pay | Admitting: Cardiovascular Disease

## 2016-05-12 LAB — CUP PACEART REMOTE DEVICE CHECK
Battery Remaining Longevity: 22 mo
Battery Voltage: 2.72 V
Brady Statistic AP VS Percent: 0 %
Brady Statistic AS VP Percent: 41 %
Date Time Interrogation Session: 20171208160122
Implantable Lead Implant Date: 20080804
Implantable Lead Implant Date: 20080804
Implantable Lead Location: 753859
Implantable Lead Model: 5076
Implantable Pulse Generator Implant Date: 20080804
Lead Channel Impedance Value: 424 Ohm
Lead Channel Pacing Threshold Amplitude: 0.625 V
Lead Channel Pacing Threshold Amplitude: 0.875 V
Lead Channel Pacing Threshold Pulse Width: 0.4 ms
Lead Channel Setting Pacing Amplitude: 1.875
Lead Channel Setting Pacing Pulse Width: 0.4 ms
Lead Channel Setting Sensing Sensitivity: 2.8 mV
MDC IDC LEAD LOCATION: 753860
MDC IDC MSMT BATTERY IMPEDANCE: 1856 Ohm
MDC IDC MSMT LEADCHNL RA PACING THRESHOLD PULSEWIDTH: 0.4 ms
MDC IDC MSMT LEADCHNL RV IMPEDANCE VALUE: 416 Ohm
MDC IDC SET LEADCHNL RV PACING AMPLITUDE: 2.5 V
MDC IDC STAT BRADY AP VP PERCENT: 54 %
MDC IDC STAT BRADY AS VS PERCENT: 5 %

## 2016-05-18 DIAGNOSIS — H6121 Impacted cerumen, right ear: Secondary | ICD-10-CM | POA: Diagnosis not present

## 2016-05-18 DIAGNOSIS — J019 Acute sinusitis, unspecified: Secondary | ICD-10-CM | POA: Diagnosis not present

## 2016-05-31 DIAGNOSIS — D492 Neoplasm of unspecified behavior of bone, soft tissue, and skin: Secondary | ICD-10-CM | POA: Diagnosis not present

## 2016-05-31 DIAGNOSIS — L570 Actinic keratosis: Secondary | ICD-10-CM | POA: Diagnosis not present

## 2016-05-31 DIAGNOSIS — D0439 Carcinoma in situ of skin of other parts of face: Secondary | ICD-10-CM | POA: Diagnosis not present

## 2016-06-11 ENCOUNTER — Other Ambulatory Visit: Payer: Self-pay | Admitting: Cardiovascular Disease

## 2016-06-21 ENCOUNTER — Other Ambulatory Visit: Payer: Self-pay | Admitting: Cardiovascular Disease

## 2016-06-22 DIAGNOSIS — E109 Type 1 diabetes mellitus without complications: Secondary | ICD-10-CM | POA: Diagnosis not present

## 2016-06-22 NOTE — Telephone Encounter (Signed)
Rx(s) sent to pharmacy electronically.  

## 2016-06-25 DIAGNOSIS — I2510 Atherosclerotic heart disease of native coronary artery without angina pectoris: Secondary | ICD-10-CM | POA: Diagnosis not present

## 2016-06-25 DIAGNOSIS — N183 Chronic kidney disease, stage 3 (moderate): Secondary | ICD-10-CM | POA: Diagnosis not present

## 2016-06-25 DIAGNOSIS — E1122 Type 2 diabetes mellitus with diabetic chronic kidney disease: Secondary | ICD-10-CM | POA: Diagnosis not present

## 2016-06-25 DIAGNOSIS — E1159 Type 2 diabetes mellitus with other circulatory complications: Secondary | ICD-10-CM | POA: Diagnosis not present

## 2016-06-25 DIAGNOSIS — E1165 Type 2 diabetes mellitus with hyperglycemia: Secondary | ICD-10-CM | POA: Diagnosis not present

## 2016-06-25 DIAGNOSIS — C61 Malignant neoplasm of prostate: Secondary | ICD-10-CM | POA: Diagnosis not present

## 2016-06-25 DIAGNOSIS — E782 Mixed hyperlipidemia: Secondary | ICD-10-CM | POA: Diagnosis not present

## 2016-06-25 DIAGNOSIS — E559 Vitamin D deficiency, unspecified: Secondary | ICD-10-CM | POA: Diagnosis not present

## 2016-06-28 ENCOUNTER — Telehealth: Payer: Self-pay | Admitting: Cardiovascular Disease

## 2016-06-28 NOTE — Telephone Encounter (Signed)
Received records from [facility] for appointment with Dr Sallyanne Kuster on 07/08/16.  Records put with Dr Croitoru's schedule for 07/08/16. lp

## 2016-06-30 ENCOUNTER — Other Ambulatory Visit: Payer: Self-pay | Admitting: Cardiovascular Disease

## 2016-07-01 NOTE — Telephone Encounter (Signed)
Rx(s) sent to pharmacy electronically.  

## 2016-07-02 ENCOUNTER — Ambulatory Visit (INDEPENDENT_AMBULATORY_CARE_PROVIDER_SITE_OTHER): Payer: Medicare HMO | Admitting: Diagnostic Neuroimaging

## 2016-07-02 ENCOUNTER — Encounter: Payer: Self-pay | Admitting: Diagnostic Neuroimaging

## 2016-07-02 VITALS — BP 129/69 | HR 60 | Wt 195.6 lb

## 2016-07-02 DIAGNOSIS — R269 Unspecified abnormalities of gait and mobility: Secondary | ICD-10-CM

## 2016-07-02 DIAGNOSIS — G20 Parkinson's disease: Secondary | ICD-10-CM | POA: Diagnosis not present

## 2016-07-02 DIAGNOSIS — E1142 Type 2 diabetes mellitus with diabetic polyneuropathy: Secondary | ICD-10-CM

## 2016-07-02 MED ORDER — CARBIDOPA-LEVODOPA 25-100 MG PO TABS: 1.0000 | ORAL_TABLET | Freq: Three times a day (TID) | ORAL | 4 refills | Status: DC

## 2016-07-02 NOTE — Progress Notes (Signed)
[facility]  PATIENT: [name] Powell DOB: 15-Apr-1923  REFERRING CLINICIAN: Edwin Dada, MD HISTORY FROM: patient, wife REASON FOR VISIT: follow up   HISTORICAL  CHIEF COMPLAINT:  Chief Complaint  Patient presents with  . Parkinson's disease    rm 83, wife- Inez Catalina, "pain in left side- will call GI dr"; no other new problems"  . Follow-up    83 month    HISTORY OF PRESENT ILLNESS:   UPDATE 07/02/16: Since last visit, doing well. Tolerating carb/levo (10am, 5pm, 10pm). Wife administers meds. No on-off fluctuations. No falls. No injuries. Using cane. Some left sided abd pain x 2 months; no tenderness to palpation. No pain with breathing. No injuries.   UPDATE 12/30/15: Since last visit tried coming off carb/levo, and then had worsening mobility and energy. Now back on carb/levo and doing better.   UPDATE 09/25/15: Since last visit, doing better, except for hosp stay for pneumonia in April 2017. Patient doesn't feel carb/levo is helping, but family think he has more energy now. PT also helping. Now on home O2 since past 1 week.  PRIOR HPI (08/13/15): 83 year old right-handed male here for evaluation of gait instability, memory loss, frequent falling.  For past 2 years patient has had increasing problems with balance, walking, coordination and increasing falls. Over the past 3 weeks patient has had generalized weakness, walking on toes, increasing shuffling gait. Patient also having problems with short-term memory, confusion, difficulty with tasks such as crossword puzzles which he used to be able to do easily. Patient is a retired Art gallery manager and was very high functioning from intellectual standpoint. Patient has had significant decline from his previous level of functioning. Patient has developed quiet hoarse voice, word finding difficulties, decreased facial expressions. In general he has slowed down quite a bit.  Patient also has constipation and urinary incontinence.  He has tendency to punch and kick in his sleep and act out his dreams although this is been going on for more than 25 years according to patient's wife. No change in sense of smell or taste.   REVIEW OF SYSTEMS: Full 14 system review of systems performed and negative with exception of: only as per HPI.    ALLERGIES: Allergies  Allergen Reactions  . Mexitil [Mexiletine]     unknown  . Lanoxin [Digoxin] Rash    Eyelid rash    HOME MEDICATIONS: Outpatient Medications Prior to Visit  Medication Sig Dispense Refill  . apixaban (ELIQUIS) 5 MG TABS tablet TAKE 1 TABLET (5 MG TOTAL) BY MOUTH 2 (TWO) TIMES DAILY. 180 tablet 1  . atorvastatin (LIPITOR) 20 MG tablet Take 20 mg by mouth daily.    . benzonatate (TESSALON) 100 MG capsule Take 1 capsule by mouth 3 (three) times daily. For 10 days.    . carbidopa-levodopa (SINEMET IR) 25-100 MG tablet Take 1 tablet by mouth 3 (three) times daily. 270 tablet 4  . Cholecalciferol (VITAMIN D PO) Take 5,000 Units by mouth 2 (two) times a week. On Friday    . furosemide (LASIX) 20 MG tablet TAKE 1 TABLET ON MONDAY, WEDNESDAY, AND FRIDAY AND TAKE 2 TABLETS (40MG ) ON TUESDAY, THURSDAY, SATURDAY, AND SUNDAY 143 tablet 2  . glimepiride (AMARYL) 4 MG tablet Take 4 mg by mouth daily before breakfast.    . JANUVIA 50 MG tablet Take 1 tablet by mouth daily.    . metFORMIN (GLUCOPHAGE-XR) 500 MG 24 hr tablet Take 500 mg by mouth daily.    . metoprolol (LOPRESSOR) 50  MG tablet TAKE 2 TABLETS (100MG ) BY MOUTH IN THE MORNING AND 1 TABLET (50MG ) IN THE EVENING. 270 tablet 1  . mometasone (ELOCON) 0.1 % cream APPLY TO LEGS AS NEEDED FOR FOOT DERMATITIS  0  . Multiple Vitamins-Minerals (PRESERVISION AREDS) CAPS Take 1 capsule by mouth 2 (two) times daily.    . polyethylene glycol (MIRALAX / GLYCOLAX) packet Take 17 g by mouth daily. To prevent constipation    . potassium chloride (K-DUR) 10 MEQ tablet Take 1 tablet (10 mEq total) by mouth daily. Tues, Thurs,Sat , Sunday  90 tablet 1  . valsartan (DIOVAN) 160 MG tablet TAKE 1 TABLET EVERY DAY 90 tablet 2   No facility-administered medications prior to visit.     PAST MEDICAL HISTORY: Past Medical History:  Diagnosis Date  . Atrial fibrillation ([place])   . Cancer ([place])   . CHB (complete heart block) ([place])   . Coronary artery disease   . Diabetes mellitus   . Dyslipidemia   . H/O prostate cancer   . Hypertension   . New onset atrial flutter, persistent 07/04/2014  . Pacemaker 12/12/2006   Medtronic adapta  . Pleural effusion, left   . Pneumonia 09/2015  . Prostate cancer ([place])   . 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 Ventricular tachycardia (paroxysmal) ([place]) 03/28/2014    PAST SURGICAL HISTORY: Past Surgical History:  Procedure Laterality Date  . CORONARY ARTERY BYPASS GRAFT  09/04/2001   LIMA to LAD,SVG to diagonal,SVG to ramus intermedius,SVG to PDA  . NM MYOVIEW LTD  01/09/2010   no ischemia  . PACEMAKER INSERTION  12/12/2006   Medtronic adapta  . PROSTATE SURGERY  2001   cancer  . TONSILLECTOMY      FAMILY HISTORY: Family History  Problem Relation Age of Onset  . Heart disease Mother     SOCIAL HISTORY:  Social History   Social History  . Marital status: Married    Spouse name: Inez Catalina  . Number of children: 2  . Years of education: 16   Occupational History  .      retired Art gallery manager   Social History Main Topics  . Smoking status: Former Smoker    Types: Cigarettes, Cigars    Quit date: 05/14/1962  . Smokeless tobacco: Never Used  . Alcohol use Yes     Comment: seldom  . Drug use: No  . Sexual activity: Not on file   Other Topics Concern  . Not on file   Social History Narrative   Lives at home with wife   Caffeine -seldom     PHYSICAL EXAM  GENERAL EXAM/CONSTITUTIONAL: Vitals:  Vitals:   07/02/16 0819  BP: 129/69  Pulse: 60  Weight: 195 lb 9.6 oz (88.7 kg)   Body mass index is 29.74  kg/m. No exam data present  Patient is in no distress; well developed, nourished and groomed; neck is supple  CARDIOVASCULAR:  Examination of carotid arteries is normal; no carotid bruits  Regular rate and rhythm, no murmurs  Examination of peripheral vascular system by observation and palpation is normal  EYES:  Ophthalmoscopic exam of optic discs and posterior segments is normal; no papilledema or hemorrhages  MUSCULOSKELETAL:  Gait, strength, tone, movements noted in Neurologic exam below  NEUROLOGIC: MENTAL STATUS:  MMSE - Mini Mental State Exam 08/13/2015  Orientation to time 5  Orientation to Place 5  Registration 3  Attention/ Calculation 2  Recall 2  Language- name 2 objects 2  Language- repeat 1  Language- follow 3 step command 3  Language- read & follow direction 1  Write a sentence 1  Copy design 1  Total score 26    awake, alert, oriented to person, place and time  recent and remote memory intact  normal attention and concentration  language fluent, comprehension intact, naming intact,   fund of knowledge appropriate  CRANIAL NERVE:   2nd - no papilledema on fundoscopic exam  2nd, 3rd, 4th, 6th - pupils equal and reactive to light, visual fields full to confrontation, extraocular muscles intact, no nystagmus  5th - facial sensation symmetric  7th - facial strength symmetric  8th - hearing intact  9th - palate elevates symmetrically, uvula midline  11th - shoulder shrug symmetric  12th - tongue protrusion midline  HOARSE VOICE  MASKED FACIES  MOTOR:   normal bulk  MILD COGWHEELING RIGIDITY IN BUE (WITH CONTRALATERAL REINFORCEMENT)  MODERATE BRADYKINESIA IN BLE; MILD BRADYKINESIA IN BUE  full strength in the BUE, BLE  SENSORY:   normal and symmetric to light touch, temperature  DECR VIB AT TOES AND ANKLES  COORDINATION:   finger-nose-finger, fine finger movements normal  REFLEXES:   deep tendon reflexes TRACE IN BUE;  ABSENT IN BLE  POSITIVE MYERSONS  NEGATIVE SNOUT  GAIT/STATION:   narrow based gait; SLOW TO RISE; STOOPED POSTURE; DECR ARM SWING; SHORT STEPS; EN BLOC TURNING; USES SINGLE POINT CANE    DIAGNOSTIC DATA (LABS, IMAGING, TESTING) - I reviewed patient records, labs, notes, testing and imaging myself where available.  Lab Results  Component Value Date   WBC 7.4 09/18/2015   HGB 8.5 (L) 09/18/2015   HCT 27.7 (L) 09/18/2015   MCV 95.8 09/18/2015   PLT 200 09/18/2015      Component Value Date/Time   NA 140 10/07/2015 1223   K 4.8 10/07/2015 1223   CL 104 10/07/2015 1223   CO2 27 10/07/2015 1223   GLUCOSE 155 (H) 10/07/2015 1223   BUN 25 10/07/2015 1223   CREATININE 1.16 (H) 10/07/2015 1223   CALCIUM 8.8 10/07/2015 1223   PROT 7.0 09/15/2015 1243   ALBUMIN 3.1 (L) 09/15/2015 1243   AST 21 09/15/2015 1243   ALT 6 (L) 09/15/2015 1243   ALKPHOS 95 09/15/2015 1243   BILITOT 0.9 09/15/2015 1243   GFRNONAA 42 (L) 09/18/2015 0301   GFRAA 49 (L) 09/18/2015 0301   No results found for: CHOL, HDL, LDLCALC, LDLDIRECT, TRIG, CHOLHDL Lab Results  Component Value Date   HGBA1C 8.6 (H) 09/15/2015   No results found for: VITAMINB12 No results found for: TSH  10/13/11 CT head [I reviewed images myself and agree with interpretation. -VRP]  1. No acute intracranial or calvarial findings. 2. Left frontal scalp soft tissue swelling.  12/11/13 TTE - Normal LV size with moderate LV hypertrophy. EF 55% with septal-lateral dyssynchrony probably from pacing and apicalhypokinesis. Mildly dilated RV wtih mildly decreased systolicfunction.  08/25/15 CT head [I reviewed images myself and agree with interpretation. -VRP]  1. Moderate cortical atrophy, essentially unchanged when compared to the CT scan from 10/13/2011. 2. Moderate hypodense changes in the white matter of both hemispheres consistent with chronic microvascular ischemic change, essentially unchanged when compared to the 2013  CT. 3. There are no acute findings.    ASSESSMENT AND PLAN  83 y.o. year old male here with gradual onset progressive balance and gait difficulty, short steps, shuffling gait, masked facies, hoarse voice, word  finding difficulties, short-term memory loss, decreased cognitive ability and generalized weakness. Signs and symptoms are suspicious for neurodegenerative process such as Parkinson's disease. Patient also has signs of diabetic neuropathy may be contributed factors well.    Ddx: parkinsonism (idiopathic vs vascular) + diabetic neuropathy  1. Parkinson's disease ([place])   2. Gait difficulty   3. Diabetic polyneuropathy associated with type 2 diabetes mellitus (HCC)      PLAN: - use rollator walker - continue physical therapy exercises - continue carbidopa-levodopa - encouraged mild stretching and physical activity  Meds ordered this encounter  Medications  . carbidopa-levodopa (SINEMET IR) 25-100 MG tablet    Sig: Take 1 tablet by mouth 3 (three) times daily.    Dispense:  270 tablet    Refill:  4   Return in about 6 months (around 12/30/2016).    [name], MD 2/42/6834, 1:96 AM Certified in Neurology, Neurophysiology and Neuroimaging  [facility] [address] [place], [place] [address] [phone]

## 2016-07-08 ENCOUNTER — Ambulatory Visit (INDEPENDENT_AMBULATORY_CARE_PROVIDER_SITE_OTHER): Payer: Medicare HMO | Admitting: Cardiovascular Disease

## 2016-07-08 ENCOUNTER — Encounter: Payer: Self-pay | Admitting: Cardiovascular Disease

## 2016-07-08 VITALS — BP 145/74 | HR 70 | Ht 68.0 in | Wt 193.0 lb

## 2016-07-08 DIAGNOSIS — I442 Atrioventricular block, complete: Secondary | ICD-10-CM

## 2016-07-08 DIAGNOSIS — I472 Ventricular tachycardia: Secondary | ICD-10-CM

## 2016-07-08 DIAGNOSIS — I5032 Chronic diastolic (congestive) heart failure: Secondary | ICD-10-CM

## 2016-07-08 DIAGNOSIS — E1142 Type 2 diabetes mellitus with diabetic polyneuropathy: Secondary | ICD-10-CM | POA: Diagnosis not present

## 2016-07-08 DIAGNOSIS — Z7901 Long term (current) use of anticoagulants: Secondary | ICD-10-CM | POA: Diagnosis not present

## 2016-07-08 DIAGNOSIS — I481 Persistent atrial fibrillation: Secondary | ICD-10-CM

## 2016-07-08 DIAGNOSIS — I4729 Other ventricular tachycardia: Secondary | ICD-10-CM

## 2016-07-08 DIAGNOSIS — Z950 Presence of cardiac pacemaker: Secondary | ICD-10-CM

## 2016-07-08 DIAGNOSIS — I25118 Atherosclerotic heart disease of native coronary artery with other forms of angina pectoris: Secondary | ICD-10-CM | POA: Diagnosis not present

## 2016-07-08 DIAGNOSIS — E785 Hyperlipidemia, unspecified: Secondary | ICD-10-CM

## 2016-07-08 DIAGNOSIS — I4819 Other persistent atrial fibrillation: Secondary | ICD-10-CM

## 2016-07-08 NOTE — Progress Notes (Signed)
Patient ID: [name] Powell, male   DOB: 06-28-1922, 83 y.o.   MRN: [id number]    Cardiology Office Note    Date:  07/09/2016   ID:  [name], Powell 09/19/1922, MRN [id number]  PCP:  [name], MD  Cardiologist:   [name], MD   Chief Complaint  Patient presents with  . Follow-up    History of Present Illness:  [name] Powell is a 83 y.o. male with CAD s/p CABG, sinus arrest and complete heart block (pacemaker dependent), Current episodes of paroxysmal atrial fibrillation and atrial flutter, chronic diastolic heart failure and recently diagnosed Parkinson's disease. He has a dual-chamber permanent pacemaker implanted in 2008 (Medtronic Adapta) and is pacemaker dependent.  He has had one more fall since his last appointment, thankfully without injury. He lost balance, did not have any dizziness or loss of consciousness. Despite treatment for Parkinson's disease, he remains very sedentary.  Interrogation of his dual-chamber permanent pacemaker (Medtronic Adapta implanted 2008, estimated generator longevity 20 months) shows ongoing persistent atrial fibrillation and ventricular paced rhythm. He has not had any break in the atrial fibrillation and at least the last 6 months. Suspect he has had persistent atrial fibrillation for much longer period of time, but the arrhythmia was underestimated because of under sensing of fibrillatory waves. 95% ventricular pacing. He has only had one episode of high ventricular rate and that was a long time ago: last September.  14 years have passed since his CABG surgery. His last nuclear study in August 2015 was low risk (small apical scar, no ischemia, EF 44%). Echo August 2015 confirmed apical akinesis, but EF was estimated at 55%. He has complete heart block and sinus node arrest and is pacemaker dependent (>80% A paced, 100% V paced).  His dual-chamber device (Medtronic) was implanted in August 2008. He had 20-beat nonsustained VT  recorded in September 2015. 30 minute episode of paroxysmal atrial tach in November 2015. In January 2016 he developed persistent atrial flutter and this appeared to trigger worsened CHF. Attempted overdrive pacing of the arrhythmia failed. He is very sensitive to programming changes. He feels better at lower rate limit programmed at 60 rather than 70 bpm. He has treated coronary risk factors (hypertension, hyperlipidemia, diabetes mellitus type 2 on oral antidiabetics).   Past Medical History:  Diagnosis Date  . Atrial fibrillation ([place])   . Cancer ([place])   . CHB (complete heart block) ([place])   . Coronary artery disease   . Diabetes mellitus   . Dyslipidemia   . H/O prostate cancer   . Hypertension   . New onset atrial flutter, persistent 07/04/2014  . Pacemaker 12/12/2006   Medtronic adapta  . Pleural effusion, left   . Pneumonia 09/2015  . Prostate cancer ([place])   . 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 Ventricular tachycardia (paroxysmal) ([place]) 03/28/2014    Past Surgical History:  Procedure Laterality Date  . CORONARY ARTERY BYPASS GRAFT  09/04/2001   LIMA to LAD,SVG to diagonal,SVG to ramus intermedius,SVG to PDA  . NM MYOVIEW LTD  01/09/2010   no ischemia  . PACEMAKER INSERTION  12/12/2006   Medtronic adapta  . PROSTATE SURGERY  2001   cancer  . TONSILLECTOMY      Current Medications: Outpatient Medications Prior to Visit  Medication Sig Dispense Refill  . apixaban (ELIQUIS) 5 MG TABS tablet TAKE 1 TABLET (5 MG TOTAL) BY MOUTH 2 (  TWO) TIMES DAILY. 180 tablet 1  . atorvastatin (LIPITOR) 20 MG tablet Take 20 mg by mouth daily.    . carbidopa-levodopa (SINEMET IR) 25-100 MG tablet Take 1 tablet by mouth 3 (three) times daily. 270 tablet 4  . Cholecalciferol (VITAMIN D PO) Take 5,000 Units by mouth 2 (two) times a week. On Friday    . furosemide (LASIX) 20 MG tablet TAKE 1 TABLET ON MONDAY, WEDNESDAY, AND FRIDAY AND TAKE 2  TABLETS (40MG ) ON TUESDAY, THURSDAY, SATURDAY, AND SUNDAY 143 tablet 2  . glimepiride (AMARYL) 4 MG tablet Take 4 mg by mouth daily before breakfast.    . JANUVIA 50 MG tablet Take 1 tablet by mouth daily.    . metFORMIN (GLUCOPHAGE-XR) 500 MG 24 hr tablet Take 500 mg by mouth daily.    . metoprolol (LOPRESSOR) 50 MG tablet TAKE 2 TABLETS (100MG ) BY MOUTH IN THE MORNING AND 1 TABLET (50MG ) IN THE EVENING. 270 tablet 1  . mometasone (ELOCON) 0.1 % cream APPLY TO LEGS AS NEEDED FOR FOOT DERMATITIS  0  . Multiple Vitamins-Minerals (PRESERVISION AREDS) CAPS Take 1 capsule by mouth 2 (two) times daily.    . polyethylene glycol (MIRALAX / GLYCOLAX) packet Take 17 g by mouth daily. To prevent constipation    . potassium chloride (K-DUR) 10 MEQ tablet Take 1 tablet (10 mEq total) by mouth daily. Tues, Thurs,Sat , Sunday (Patient taking differently: Take 10 mEq by mouth daily. Tues, Thurs,Sat , Sunday ans 1/2 tablet on mon wed fri) 90 tablet 1  . valsartan (DIOVAN) 160 MG tablet TAKE 1 TABLET EVERY DAY 90 tablet 2   No facility-administered medications prior to visit.      Allergies:   Mexitil [mexiletine] and Lanoxin [digoxin]   Social History   Social History  . Marital status: Married    Spouse name: [name] Powell  . Number of children: 2  . Years of education: 16   Occupational History  .      retired Art gallery manager   Social History Main Topics  . Smoking status: Former Smoker    Types: Cigarettes, Cigars    Quit date: 05/14/1962  . Smokeless tobacco: Never Used  . Alcohol use Yes     Comment: seldom  . Drug use: No  . Sexual activity: Not Asked   Other Topics Concern  . None   Social History Narrative   Lives at home with wife   Caffeine -seldom     Family History:  The patient's family history includes Heart disease in his mother.   ROS:   Please see the history of present illness.    ROS All other systems reviewed and are negative.   PHYSICAL EXAM:   VS:  BP (!) 145/74    Pulse 70   Ht 5\' 8"  (1.727 m)   Wt 87.5 kg (193 lb)   BMI 29.35 kg/m    GEN: Well nourished, well developed, in no acute distress  HEENT: normal  Neck: no JVD, carotid bruits, or masses Cardiac: RRR; no murmurs, rubs, or gallops,no edema  Respiratory:  clear to auscultation bilaterally, normal work of breathing GI: soft, nontender, nondistended, + BS MS: no deformity or atrophy  Skin: warm and dry, no rash Neuro:  Alert and Oriented x 3, Strength and sensation are intact. There is some hesitancy initiation of movements, responds to questions and some dulling of facial expressions, all suggestive of parkinsonism Psych: euthymic mood, full affect  Wt Readings from Last 3 Encounters:  07/08/16 87.5 kg (193 lb)  07/02/16 88.7 kg (195 lb 9.6 oz)  01/16/16 87.1 kg (192 lb)      Studies/Labs Reviewed:   EKG:  EKG is ordered today.  The ekg ordered today demonstrates Atrial ventricular sequential pacing, one PAC  Recent Labs: 09/15/2015: ALT 6 09/18/2015: Hemoglobin 8.5; Platelets 200 10/07/2015: Brain Natriuretic Peptide 329.2; BUN 25; Creat 1.16; Potassium 4.8; Sodium 140  Most recent labs show sodium 142, potassium 4.8, glucose 203, BUN 31, creatinine 1.61, hemoglobin A1c 8.8%, glucose 203 Total cholesterol 104, triglycerides 111, HDL 60, LDL 30  ASSESSMENT:    1. CHB (complete heart block) (HCC)   2. Pacemaker   3. Ventricular tachycardia (paroxysmal) (HCC)   4. Persistent atrial fibrillation ([place])   5. Long term current use of anticoagulant   6. Coronary artery disease involving native coronary artery of native heart with other form of angina pectoris ([place])   7. Chronic diastolic congestive heart failure ([place])   8. Dyslipidemia   9. Type 2 diabetes mellitus with diabetic polyneuropathy, without long-term current use of insulin (HCC)      PLAN:  In order of problems listed above:  1. CHB:  Pacemaker dependent 2. PPM: Normal pacemaker function. He is very sensitive to  programming changes. He feels better at lower rate limit programmed at 60 rather than 70 bpm. Complaints of long-term persistent atrial fibrillation that is unlikely to stop, switched programming mode to VVIR today. Hopefully this will allow for longer battery life. 3. NSVT: Episodes of ventricular tachycardia are very infrequent and asymptomatic. He is on beta blocker therapy, which should not be increased since he has problems with orthostatic hypotension. 4. AFib: Seems to be in permanent atrial fibrillation now. He should continue anticoagulation as long as this is safe. CHADSVasc 6 (age 71, CAD, CHF, HTN, DM) 5. Eliquis: Has not had bleeding events. Had 2 minor falls in the last 12 months, usually is very steady on his feet. Asked him to call me if there is any pattern of increased frequency of falls 6. CAD s/p CABG: Asymptomatic. Relatively recent low risk nuclear stress test without ischemia. On statin, beta blocker, not taking aspirin due to concomitant anticoagulation 7. CHF: Most recent echocardiogram shows left ventricular ejection fraction 55% with pacing-induced dyssynchrony. Clinically appears euvolemic. Very sedentary, so hard to assess functional status . Orthostatic hypotension has not been a complaint since we reduced his diuretic dose. 8. HLP on statin. Excellent recent lipid profile. 9. DM: Good glycemic control   Medication Adjustments/Labs and Tests Ordered: Current medicines are reviewed at length with the patient today.  Concerns regarding medicines are outlined above.  Medication changes, Labs and Tests ordered today are listed in the Patient Instructions below. Patient Instructions  Dr Sallyanne Kuster recommends that you continue on your current medications as directed. Please refer to the Current Medication list given to you today.  Remote monitoring is used to monitor your Pacemaker of ICD from home. This monitoring reduces the number of office visits required to check your device  to one time per year. It allows [place] to keep an eye on the functioning of your device to ensure it is working properly. You are scheduled for a device check from home on Thursday, May 31st, 2018. You may send your transmission at any time that day. If you have a wireless device, the transmission will be sent automatically. After your physician reviews your transmission, you will receive a postcard with your next transmission date.  Dr  Renji Berwick recommends that you schedule a follow-up appointment in 6 months with a pacemaker check. You will receive a reminder letter in the mail two months in advance. If you don't receive a letter, please call our office to schedule the follow-up appointment.  If you need a refill on your cardiac medications before your next appointment, please call your pharmacy.    Signed, [name], MD  07/09/2016 4:45 PM    [facility] [place], [place], [place]  [address] Phone: [phone]; Fax: [phone]

## 2016-07-08 NOTE — Patient Instructions (Signed)
Dr Sallyanne Kuster recommends that you continue on your current medications as directed. Please refer to the Current Medication list given to you today.  Remote monitoring is used to monitor your Pacemaker of ICD from home. This monitoring reduces the number of office visits required to check your device to one time per year. It allows [place] to keep an eye on the functioning of your device to ensure it is working properly. You are scheduled for a device check from home on Thursday, May 31st, 2018. You may send your transmission at any time that day. If you have a wireless device, the transmission will be sent automatically. After your physician reviews your transmission, you will receive a postcard with your next transmission date.  Dr Sallyanne Kuster recommends that you schedule a follow-up appointment in 6 months with a pacemaker check. You will receive a reminder letter in the mail two months in advance. If you don't receive a letter, please call our office to schedule the follow-up appointment.  If you need a refill on your cardiac medications before your next appointment, please call your pharmacy.

## 2016-07-12 ENCOUNTER — Other Ambulatory Visit: Payer: Self-pay | Admitting: Cardiovascular Disease

## 2016-07-23 DIAGNOSIS — J069 Acute upper respiratory infection, unspecified: Secondary | ICD-10-CM | POA: Diagnosis not present

## 2016-07-26 ENCOUNTER — Encounter (HOSPITAL_COMMUNITY): Payer: Self-pay | Admitting: Emergency Medicine

## 2016-07-26 ENCOUNTER — Inpatient Hospital Stay (HOSPITAL_COMMUNITY)
Admission: EM | Admit: 2016-07-26 | Discharge: 2016-07-29 | DRG: 291 | Disposition: A | Discharge status: Home Health | Payer: Medicare HMO | Attending: Internal Medicine | Admitting: Internal Medicine

## 2016-07-26 ENCOUNTER — Emergency Department (HOSPITAL_COMMUNITY): Payer: Medicare HMO

## 2016-07-26 DIAGNOSIS — D649 Anemia, unspecified: Secondary | ICD-10-CM | POA: Diagnosis present

## 2016-07-26 DIAGNOSIS — R197 Diarrhea, unspecified: Secondary | ICD-10-CM | POA: Diagnosis present

## 2016-07-26 DIAGNOSIS — I4892 Unspecified atrial flutter: Secondary | ICD-10-CM | POA: Diagnosis present

## 2016-07-26 DIAGNOSIS — G20 Parkinson's disease: Secondary | ICD-10-CM | POA: Diagnosis present

## 2016-07-26 DIAGNOSIS — I480 Paroxysmal atrial fibrillation: Secondary | ICD-10-CM | POA: Diagnosis present

## 2016-07-26 DIAGNOSIS — E1165 Type 2 diabetes mellitus with hyperglycemia: Secondary | ICD-10-CM | POA: Diagnosis not present

## 2016-07-26 DIAGNOSIS — Z950 Presence of cardiac pacemaker: Secondary | ICD-10-CM

## 2016-07-26 DIAGNOSIS — I5042 Chronic combined systolic (congestive) and diastolic (congestive) heart failure: Secondary | ICD-10-CM | POA: Diagnosis not present

## 2016-07-26 DIAGNOSIS — J440 Chronic obstructive pulmonary disease with acute lower respiratory infection: Secondary | ICD-10-CM | POA: Diagnosis present

## 2016-07-26 DIAGNOSIS — Z87891 Personal history of nicotine dependence: Secondary | ICD-10-CM

## 2016-07-26 DIAGNOSIS — I442 Atrioventricular block, complete: Secondary | ICD-10-CM | POA: Diagnosis not present

## 2016-07-26 DIAGNOSIS — I5032 Chronic diastolic (congestive) heart failure: Secondary | ICD-10-CM | POA: Diagnosis present

## 2016-07-26 DIAGNOSIS — I2510 Atherosclerotic heart disease of native coronary artery without angina pectoris: Secondary | ICD-10-CM | POA: Diagnosis present

## 2016-07-26 DIAGNOSIS — E1122 Type 2 diabetes mellitus with diabetic chronic kidney disease: Secondary | ICD-10-CM | POA: Diagnosis not present

## 2016-07-26 DIAGNOSIS — N183 Chronic kidney disease, stage 3 (moderate): Secondary | ICD-10-CM | POA: Diagnosis not present

## 2016-07-26 DIAGNOSIS — R0602 Shortness of breath: Secondary | ICD-10-CM

## 2016-07-26 DIAGNOSIS — I10 Essential (primary) hypertension: Secondary | ICD-10-CM | POA: Diagnosis not present

## 2016-07-26 DIAGNOSIS — R531 Weakness: Secondary | ICD-10-CM | POA: Diagnosis not present

## 2016-07-26 DIAGNOSIS — R05 Cough: Secondary | ICD-10-CM

## 2016-07-26 DIAGNOSIS — J069 Acute upper respiratory infection, unspecified: Secondary | ICD-10-CM | POA: Diagnosis not present

## 2016-07-26 DIAGNOSIS — J90 Pleural effusion, not elsewhere classified: Secondary | ICD-10-CM | POA: Diagnosis present

## 2016-07-26 DIAGNOSIS — R4182 Altered mental status, unspecified: Secondary | ICD-10-CM | POA: Diagnosis not present

## 2016-07-26 DIAGNOSIS — Z79899 Other long term (current) drug therapy: Secondary | ICD-10-CM | POA: Diagnosis not present

## 2016-07-26 DIAGNOSIS — Z951 Presence of aortocoronary bypass graft: Secondary | ICD-10-CM | POA: Diagnosis not present

## 2016-07-26 DIAGNOSIS — Z79890 Hormone replacement therapy (postmenopausal): Secondary | ICD-10-CM

## 2016-07-26 DIAGNOSIS — J449 Chronic obstructive pulmonary disease, unspecified: Secondary | ICD-10-CM | POA: Diagnosis present

## 2016-07-26 DIAGNOSIS — N179 Acute kidney failure, unspecified: Secondary | ICD-10-CM | POA: Diagnosis not present

## 2016-07-26 DIAGNOSIS — J189 Pneumonia, unspecified organism: Secondary | ICD-10-CM | POA: Diagnosis present

## 2016-07-26 DIAGNOSIS — I5033 Acute on chronic diastolic (congestive) heart failure: Secondary | ICD-10-CM | POA: Diagnosis present

## 2016-07-26 DIAGNOSIS — Z8546 Personal history of malignant neoplasm of prostate: Secondary | ICD-10-CM

## 2016-07-26 DIAGNOSIS — E860 Dehydration: Secondary | ICD-10-CM | POA: Diagnosis present

## 2016-07-26 DIAGNOSIS — R402142 Coma scale, eyes open, spontaneous, at arrival to emergency department: Secondary | ICD-10-CM | POA: Diagnosis present

## 2016-07-26 DIAGNOSIS — E785 Hyperlipidemia, unspecified: Secondary | ICD-10-CM | POA: Diagnosis present

## 2016-07-26 DIAGNOSIS — I130 Hypertensive heart and chronic kidney disease with heart failure and stage 1 through stage 4 chronic kidney disease, or unspecified chronic kidney disease: Principal | ICD-10-CM | POA: Diagnosis present

## 2016-07-26 DIAGNOSIS — Z8249 Family history of ischemic heart disease and other diseases of the circulatory system: Secondary | ICD-10-CM

## 2016-07-26 DIAGNOSIS — Z7901 Long term (current) use of anticoagulants: Secondary | ICD-10-CM

## 2016-07-26 DIAGNOSIS — I4891 Unspecified atrial fibrillation: Secondary | ICD-10-CM | POA: Diagnosis present

## 2016-07-26 DIAGNOSIS — I110 Hypertensive heart disease with heart failure: Secondary | ICD-10-CM | POA: Diagnosis not present

## 2016-07-26 DIAGNOSIS — R402252 Coma scale, best verbal response, oriented, at arrival to emergency department: Secondary | ICD-10-CM | POA: Diagnosis present

## 2016-07-26 DIAGNOSIS — R2681 Unsteadiness on feet: Secondary | ICD-10-CM | POA: Diagnosis not present

## 2016-07-26 DIAGNOSIS — Z888 Allergy status to other drugs, medicaments and biological substances status: Secondary | ICD-10-CM

## 2016-07-26 DIAGNOSIS — I509 Heart failure, unspecified: Secondary | ICD-10-CM | POA: Diagnosis not present

## 2016-07-26 DIAGNOSIS — E1142 Type 2 diabetes mellitus with diabetic polyneuropathy: Secondary | ICD-10-CM

## 2016-07-26 DIAGNOSIS — R059 Cough, unspecified: Secondary | ICD-10-CM

## 2016-07-26 DIAGNOSIS — R402362 Coma scale, best motor response, obeys commands, at arrival to emergency department: Secondary | ICD-10-CM | POA: Diagnosis present

## 2016-07-26 LAB — PROTIME-INR
INR: 1.34
PROTHROMBIN TIME: 16.7 s — AB (ref 11.4–15.2)

## 2016-07-26 LAB — URINALYSIS, ROUTINE W REFLEX MICROSCOPIC
BACTERIA UA: NONE SEEN
BILIRUBIN URINE: NEGATIVE
Glucose, UA: NEGATIVE mg/dL
KETONES UR: 5 mg/dL — AB
LEUKOCYTES UA: NEGATIVE
Nitrite: NEGATIVE
PH: 5 (ref 5.0–8.0)
PROTEIN: 100 mg/dL — AB
SQUAMOUS EPITHELIAL / LPF: NONE SEEN
Specific Gravity, Urine: 1.014 (ref 1.005–1.030)

## 2016-07-26 LAB — COMPREHENSIVE METABOLIC PANEL
ALT: 15 U/L — AB (ref 17–63)
ANION GAP: 11 (ref 5–15)
AST: 40 U/L (ref 15–41)
Albumin: 3.7 g/dL (ref 3.5–5.0)
Alkaline Phosphatase: 116 U/L (ref 38–126)
BUN: 32 mg/dL — ABNORMAL HIGH (ref 6–20)
CHLORIDE: 103 mmol/L (ref 101–111)
CO2: 25 mmol/L (ref 22–32)
CREATININE: 1.59 mg/dL — AB (ref 0.61–1.24)
Calcium: 8.4 mg/dL — ABNORMAL LOW (ref 8.9–10.3)
GFR, EST AFRICAN AMERICAN: 42 mL/min — AB (ref >60)
GFR, EST NON AFRICAN AMERICAN: 36 mL/min — AB (ref >60)
Glucose, Bld: 111 mg/dL — ABNORMAL HIGH (ref 65–99)
POTASSIUM: 4 mmol/L (ref 3.5–5.1)
SODIUM: 139 mmol/L (ref 135–145)
Total Bilirubin: 0.6 mg/dL (ref 0.3–1.2)
Total Protein: 7.6 g/dL (ref 6.5–8.1)

## 2016-07-26 LAB — CBC
HEMATOCRIT: 35.8 % — AB (ref 39.0–52.0)
HEMOGLOBIN: 11.2 g/dL — AB (ref 13.0–17.0)
MCH: 31.1 pg (ref 26.0–34.0)
MCHC: 31.3 g/dL (ref 30.0–36.0)
MCV: 99.4 fL (ref 78.0–100.0)
Platelets: 160 10*3/uL (ref 150–400)
RBC: 3.6 MIL/uL — AB (ref 4.22–5.81)
RDW: 16 % — ABNORMAL HIGH (ref 11.5–15.5)
WBC: 6.1 10*3/uL (ref 4.0–10.5)

## 2016-07-26 LAB — I-STAT TROPONIN, ED: TROPONIN I, POC: 0.06 ng/mL (ref 0.00–0.08)

## 2016-07-26 LAB — I-STAT CG4 LACTIC ACID, ED: Lactic Acid, Venous: 2 mmol/L (ref 0.5–1.9)

## 2016-07-26 LAB — TSH: TSH: 1.957 u[IU]/mL (ref 0.350–4.500)

## 2016-07-26 LAB — LIPASE, BLOOD: LIPASE: 34 U/L (ref 11–51)

## 2016-07-26 LAB — BRAIN NATRIURETIC PEPTIDE: B Natriuretic Peptide: 502.5 pg/mL — ABNORMAL HIGH (ref 0.0–100.0)

## 2016-07-26 NOTE — ED Provider Notes (Signed)
[facility] DEPT Provider Note   CSN: [id number] Arrival date & time: 07/26/16  1625     History   Chief Complaint Chief Complaint  Patient presents with  . Cough  . Weakness    HPI [name] Powell is a 83 y.o. male With a past medical history significant for Parkinson's, diabetes, hypertension, CAD status post CABG, complete heart block with pacemaker dependence, and prostate cancer who presents from his PCP for multiple complaints including generalized weakness, fatigue, decreasing adulation, persistent cough, lower extremity edema, decreased appetite, and diarrhea. Patient is accompanied by multiple family members who report that the patient lives with his wife at home. Patient was ambulatory until several days ago when he has become very weak. He has not been eating and drinking as much over the last week. He has had no specific falls. He does report several days ago, he had some diarrhea that has improved. He denies constipation. They report that he has had congestion and cough for the last few days that is productive of a clear sputum. He has had some subjective chills but no documented fevers. He went to see his PCP on Friday and after leaving, develop worsened weakness in his right leg as well as generalized weakness. When they saw the PCP today, he was immediately sent here for evaluation. They were concerned about infection, dehydration, TIA, or other abnormality leading to his precipitous decline in functional status. Patient currently denies any chest pain, palpitations, nausea, vomiting, changes in urination, or other symptoms.       Cough  This is a new problem. The current episode started more than 2 days ago. The problem occurs constantly. The problem has not changed since onset.The cough is productive of sputum. There has been no fever. Associated symptoms include chills and myalgias. Pertinent negatives include no chest pain, no headaches, no rhinorrhea, no shortness of  breath and no wheezing. He has tried nothing for the symptoms. The treatment provided no relief. His past medical history is significant for COPD.    Past Medical History:  Diagnosis Date  . Atrial fibrillation ([place])   . Cancer ([place])   . CHB (complete heart block) ([place])   . Coronary artery disease   . Diabetes mellitus   . Dyslipidemia   . H/O prostate cancer   . Hypertension   . New onset atrial flutter, persistent 07/04/2014  . Pacemaker 12/12/2006   Medtronic adapta  . Pleural effusion, left   . Pneumonia 09/2015  . Prostate cancer ([place])   . 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 Ventricular tachycardia (paroxysmal) ([place]) 03/28/2014    Patient Active Problem List   Diagnosis Date Noted  . Long term current use of anticoagulant 01/16/2016  . Acute respiratory failure with hypoxia ([place]) 09/15/2015  . Acute on chronic diastolic (congestive) heart failure 09/15/2015  . CKD (chronic kidney disease), stage III 09/15/2015  . Atrial fibrillation ([place])   . Pleural effusion, left   . Acute diastolic (congestive) heart failure   . Community acquired pneumonia 02/19/2015  . CAP (community acquired pneumonia) 02/19/2015  . Atrial flutter ([place]) 01/20/2015  . New onset atrial flutter, persistent 07/04/2014  . Ventricular tachycardia (paroxysmal) ([place]) 03/28/2014  . Chronic diastolic congestive heart failure ([place]) 01/02/2014  . Pacemaker dependent - Medtronic 01/10/2013  . CAD s/p CABG 2003 01/10/2013  . Sinus arrest 01/10/2013  . CHB (complete heart block) ([place]) 01/10/2013  . DM2 (  diabetes mellitus, type 2) ([place]) 01/10/2013  . Dyslipidemia 01/10/2013  . HTN (hypertension) 01/10/2013    Past Surgical History:  Procedure Laterality Date  . CORONARY ARTERY BYPASS GRAFT  09/04/2001   LIMA to LAD,SVG to diagonal,SVG to ramus intermedius,SVG to PDA  . NM MYOVIEW LTD  01/09/2010   no ischemia  . PACEMAKER INSERTION  12/12/2006    Medtronic adapta  . PROSTATE SURGERY  2001   cancer  . TONSILLECTOMY         Home Medications    Prior to Admission medications   Medication Sig Start Date End Date Taking? Authorizing Provider  atorvastatin (LIPITOR) 20 MG tablet Take 20 mg by mouth daily.    Historical Provider, MD  carbidopa-levodopa (SINEMET IR) 25-100 MG tablet Take 1 tablet by mouth 3 (three) times daily. 07/02/16   [name], MD  Cholecalciferol (VITAMIN D PO) Take 5,000 Units by mouth 2 (two) times a week. On Friday    Historical Provider, MD  ELIQUIS 5 MG TABS tablet TAKE 1 TABLET TWICE DAILY 07/13/16   [name], MD  furosemide (LASIX) 20 MG tablet TAKE 1 TABLET ON MONDAY, WEDNESDAY, AND FRIDAY AND TAKE 2 TABLETS (40MG ) ON TUESDAY, THURSDAY, SATURDAY, AND SUNDAY 07/01/16   [name], MD  glimepiride (AMARYL) 4 MG tablet Take 4 mg by mouth daily before breakfast.    Historical Provider, MD  JANUVIA 50 MG tablet Take 1 tablet by mouth daily. 05/27/15   Historical Provider, MD  metFORMIN (GLUCOPHAGE-XR) 500 MG 24 hr tablet Take 500 mg by mouth daily. 02/10/15   Historical Provider, MD  metoprolol (LOPRESSOR) 50 MG tablet TAKE 2 TABLETS (100MG ) BY MOUTH IN THE MORNING AND 1 TABLET (50MG ) IN THE EVENING. 04/29/16   [name], MD  mometasone (ELOCON) 0.1 % cream APPLY TO LEGS AS NEEDED FOR FOOT DERMATITIS 01/10/15   Historical Provider, MD  Multiple Vitamins-Minerals (PRESERVISION AREDS) CAPS Take 1 capsule by mouth 2 (two) times daily.    Historical Provider, MD  polyethylene glycol (MIRALAX / GLYCOLAX) packet Take 17 g by mouth daily. To prevent constipation    Historical Provider, MD  potassium chloride (K-DUR) 10 MEQ tablet Take 1 tablet (10 mEq total) by mouth daily. Tues, Thurs,Sat , Sunday Patient taking differently: Take 10 mEq by mouth daily. Tues, Thurs,Sat , Sunday ans 1/2 tablet on mon wed fri 02/23/16   [name], MD  valsartan (DIOVAN) 160 MG tablet TAKE 1 TABLET EVERY DAY 06/22/16    [name], MD    Family History Family History  Problem Relation Age of Onset  . Heart disease Mother     Social History Social History  Substance Use Topics  . Smoking status: Former Smoker    Types: Cigarettes, Cigars    Quit date: 05/14/1962  . Smokeless tobacco: Never Used  . Alcohol use Yes     Comment: seldom     Allergies   Lanoxin [digoxin] and Mexitil [mexiletine]   Review of Systems Review of Systems  Constitutional: Positive for appetite change, chills and fatigue. Negative for diaphoresis and fever.  HENT: Negative for congestion and rhinorrhea.   Eyes: Negative for visual disturbance.  Respiratory: Positive for cough. Negative for chest tightness, shortness of breath, wheezing and stridor.   Cardiovascular: Positive for leg swelling. Negative for chest pain and palpitations.  Gastrointestinal: Positive for diarrhea. Negative for abdominal pain, nausea and vomiting.  Genitourinary: Negative for dysuria.  Musculoskeletal: Positive for myalgias. Negative for back pain, neck pain  and neck stiffness.  Skin: Negative for rash and wound.  Neurological: Negative for light-headedness and headaches.  Psychiatric/Behavioral: Positive for confusion. Negative for agitation.  All other systems reviewed and are negative.    Physical Exam Updated Vital Signs BP (!) 155/63 (BP Location: Right Arm)   Pulse 70   Temp 98.9 F (37.2 C) (Oral)   Resp 16   SpO2 94%   Physical Exam  Constitutional: He appears well-developed and well-nourished. No distress.  HENT:  Head: Normocephalic and atraumatic.  Right Ear: External ear normal.  Left Ear: External ear normal.  Nose: Nose normal.  Mouth/Throat: Oropharynx is clear and moist. No oropharyngeal exudate.  Eyes: Conjunctivae and EOM are normal. Pupils are equal, round, and reactive to light.  Neck: Normal range of motion. Neck supple.  Cardiovascular: Normal rate, regular rhythm, normal heart sounds and intact  distal pulses.   No murmur heard. Pulmonary/Chest: Effort normal. No stridor. No tachypnea. No respiratory distress. He has no decreased breath sounds. He has no wheezes. He has no rhonchi. He has rales in the right lower field and the left lower field. He exhibits no tenderness.  Abdominal: Soft. There is no tenderness. There is no rebound and no guarding.  Musculoskeletal: He exhibits no tenderness.       Right lower leg: He exhibits edema.       Left lower leg: He exhibits edema.  Neurological: He is alert. He is disoriented. He displays no tremor and normal reflexes. No cranial nerve deficit or sensory deficit. He exhibits normal muscle tone. He displays no seizure activity. Coordination normal. GCS eye subscore is 4. GCS verbal subscore is 5. GCS motor subscore is 6.  Gait testing deferred due to generalized weakness  Skin: Skin is warm. Capillary refill takes less than 2 seconds. No rash noted. He is not diaphoretic. No erythema. No pallor.  Psychiatric: He has a normal mood and affect.  Nursing note and vitals reviewed.    ED Treatments / Results  Labs (all labs ordered are listed, but only abnormal results are displayed) Labs Reviewed  COMPREHENSIVE METABOLIC PANEL - Abnormal; Notable for the following:       Result Value   Glucose, Bld 111 (*)    BUN 32 (*)    Creatinine, Ser 1.59 (*)    Calcium 8.4 (*)    ALT 15 (*)    GFR calc non Af Amer 36 (*)    GFR calc Af Amer 42 (*)    All other components within normal limits  CBC - Abnormal; Notable for the following:    RBC 3.60 (*)    Hemoglobin 11.2 (*)    HCT 35.8 (*)    RDW 16.0 (*)    All other components within normal limits  URINALYSIS, ROUTINE W REFLEX MICROSCOPIC - Abnormal; Notable for the following:    Hgb urine dipstick SMALL (*)    Ketones, ur 5 (*)    Protein, ur 100 (*)    All other components within normal limits  PROTIME-INR - Abnormal; Notable for the following:    Prothrombin Time 16.7 (*)    All other  components within normal limits  BRAIN NATRIURETIC PEPTIDE - Abnormal; Notable for the following:    B Natriuretic Peptide 502.5 (*)    All other components within normal limits  TROPONIN I - Abnormal; Notable for the following:    Troponin I 0.06 (*)    All other components within normal limits  TROPONIN I -  Abnormal; Notable for the following:    Troponin I 0.06 (*)    All other components within normal limits  COMPREHENSIVE METABOLIC PANEL - Abnormal; Notable for the following:    Glucose, Bld 165 (*)    BUN 32 (*)    Creatinine, Ser 1.62 (*)    Calcium 7.8 (*)    Total Protein 6.1 (*)    Albumin 3.0 (*)    GFR calc non Af Amer 35 (*)    GFR calc Af Amer 41 (*)    All other components within normal limits  CBC WITH DIFFERENTIAL/PLATELET - Abnormal; Notable for the following:    RBC 3.15 (*)    Hemoglobin 9.8 (*)    HCT 31.2 (*)    RDW 16.4 (*)    Platelets 146 (*)    Monocytes Absolute 1.2 (*)    All other components within normal limits  GLUCOSE, CAPILLARY - Abnormal; Notable for the following:    Glucose-Capillary 135 (*)    All other components within normal limits  I-STAT CG4 LACTIC ACID, ED - Abnormal; Notable for the following:    Lactic Acid, Venous 2.00 (*)    All other components within normal limits  URINE CULTURE  LIPASE, BLOOD  TSH  TROPONIN I  GLUCOSE, CAPILLARY  I-STAT CG4 LACTIC ACID, ED  I-STAT TROPOININ, ED  I-STAT TROPOININ, ED    EKG  EKG Interpretation  Date/Time:  Monday July 26 2016 22:21:30 EDT Ventricular Rate:  75 PR Interval:    QRS Duration: 153 QT Interval:  456 QTC Calculation: 612 R Axis:     Text Interpretation:  EASI Paced rhyth, when copared to prior 09/13/15, similar morphology No STEMI Confirmed by Sherry Ruffing MD, Samaria Anes [phone]) on 07/26/2016 10:31:47 PM       Radiology Dg Chest 2 View  Result Date: 07/26/2016 CLINICAL DATA:  Hypertension.  CABG. EXAM: CHEST  2 VIEW COMPARISON:  09/18/2015 . FINDINGS: Cardiac pacer  noted in stable position. Stable cardiomegaly. Diffuse mild bilateral interstitial prominence noted. Small left pleural effusion. Mild CHF cannot be completely excluded. IMPRESSION: 1.  Cardiac pacer noted in stable position.  Prior CABG. 2. Cardiomegaly with pulmonary vascular prominence and very mild interstitial prominence. Small left pleural effusion cannot be excluded. Findings consist with mild CHF . Electronically Signed   By: Marcello Moores  Register   On: 07/26/2016 17:21   Ct Head Wo Contrast  Result Date: 07/26/2016 CLINICAL DATA:  Right leg weakness EXAM: CT HEAD WITHOUT CONTRAST TECHNIQUE: Contiguous axial images were obtained from the base of the skull through the vertex without intravenous contrast. COMPARISON:  08/25/2015 FINDINGS: Brain: Diffuse atrophic changes and chronic white matter ischemic changes are seen. No findings to suggest acute hemorrhage, acute infarction or space-occupying mass lesion are noted. Vascular: No hyperdense vessel or unexpected calcification. Skull: Normal. Negative for fracture or focal lesion. Sinuses/Orbits: No acute finding. Other: None. IMPRESSION: Chronic atrophic and ischemic changes without acute abnormality. Electronically Signed   By: Inez Catalina M.D.   On: 07/26/2016 21:22    Procedures Procedures (including critical care time)  Medications Ordered in ED Medications  benzonatate (TESSALON) capsule 100 mg (not administered)  multivitamin with minerals tablet 1 tablet (1 tablet Oral Not Given 07/27/16 1000)  carbidopa-levodopa (SINEMET IR) 25-100 MG per tablet immediate release 1 tablet (1 tablet Oral Given 07/27/16 1050)  irbesartan (AVAPRO) tablet 150 mg (150 mg Oral Given 07/27/16 1050)  potassium chloride (K-DUR) CR tablet 10 mEq (10 mEq Oral Given 07/27/16 1050)  linagliptin (TRADJENTA) tablet 5 mg (5 mg Oral Given 07/27/16 1050)  atorvastatin (LIPITOR) tablet 20 mg (20 mg Oral Given 07/27/16 1050)  multivitamin (PROSIGHT) tablet 1 tablet (1 tablet  Oral Given 07/27/16 1050)  polyethylene glycol (MIRALAX / GLYCOLAX) packet 17 g (17 g Oral Given 07/27/16 1051)  furosemide (LASIX) injection 20 mg (20 mg Intravenous Given 07/27/16 0841)  acetaminophen (TYLENOL) tablet 650 mg (not administered)    Or  acetaminophen (TYLENOL) suppository 650 mg (not administered)  ondansetron (ZOFRAN) tablet 4 mg (not administered)    Or  ondansetron (ZOFRAN) injection 4 mg (not administered)  metoprolol (LOPRESSOR) tablet 100 mg (100 mg Oral Given 07/27/16 1050)    And  metoprolol (LOPRESSOR) tablet 50 mg (not administered)  insulin aspart (novoLOG) injection 0-9 Units (not administered)  insulin aspart (novoLOG) injection 0-5 Units (not administered)  apixaban (ELIQUIS) tablet 2.5 mg (not administered)  furosemide (LASIX) injection 40 mg (40 mg Intravenous Given 07/27/16 0112)     Initial Impression / Assessment and Plan / ED Course  I have reviewed the triage vital signs and the nursing notes.  Pertinent labs & imaging results that were available during my care of the patient were reviewed by me and considered in my medical decision making (see chart for details).     METE PURDUM is a 83 y.o. male With a past medical history significant for Parkinson's, diabetes, hypertension, CAD status post CABG, complete heart block with pacemaker dependence, and prostate cancer who presents from his PCP for multiple complaints including generalized weakness, fatigue, decreasing adulation, persistent cough, lower extremity edema, decreased appetite, and diarrhea.  History and exam are seen above. On exam, patient has some bilateral lower extremity edema the family reports is worse than normal. Patient also has some crackles in the lower areas of his lungs. Clinical concern for some fluid overload. Patient's chest was nontender and abdomen was nontender. No focal neurologic deficits, specifically, no weakness found in either leg. Patient was disoriented to time. No  other significant abnormalities found on  exam.  Based on clinical symptoms, suspect patient may be dehydrated due to recent diarrhea and decreased oral intake despite the clinical symptoms of fluid overload in his lungs and legs. Given the chills and productive cough, patient worked for possible pneumonia, possible dehydration, electrolyte other melodies, or other sources of infection.  Diagnostic testing results are seen above. Initial troponin negative. BNP slightly elevated however, it is improved from prior exacerbations of CHF. INR nonelevated. Urinalysis did not show evidence of UTI. TSH normal. Lactic acid slightly elevated at 2.0. Suspect this correlates with slight dehydration. CMP shows acute kidney injury. Patient will likely need rehydration for his kidneys but will also need to be monitored for worsen fluid overload in his legs and lungs.  CT of the head showed no acute other maladies but there was chronic atrophy and ischemic changes. Patient may have had TIA from his right leg weakness that is resolved. Will defer to admitting team for further workup and imaging as patient will need to have pacemaker checked for MRI ability. Order for pacemaker interrogation was performed results had not yet returned.  Chest x-ray shows CHF and pleural effusion.  Given concern for patient needing both rehydration and diuresis, his worsened kidney function, and his decrease in ambulatory status living with only his elderly wife, patient will require admission for further management. Patient admitted to hospitalist service for further management. Suspect patient will need PT OT during admission to determine functional  status needs.  Patient admitted in stable condition. Admitting team requested Lasix administration, this was ordered.   Final Clinical Impressions(s) / ED Diagnoses   Final diagnoses:  AKI (acute kidney injury) ([place])  Cough  Generalized weakness    Clinical Impression: 1. AKI  (acute kidney injury) ([place])   2. Cough   3. Generalized weakness     Disposition: Admit to [place], MD 07/27/16 [phone]

## 2016-07-26 NOTE — ED Triage Notes (Signed)
Pt here for increased weakness possibly worse on right side and cough and URI sx x 4 days

## 2016-07-27 ENCOUNTER — Encounter (HOSPITAL_COMMUNITY): Payer: Self-pay | Admitting: Internal Medicine

## 2016-07-27 ENCOUNTER — Observation Stay (HOSPITAL_COMMUNITY): Payer: Medicare HMO

## 2016-07-27 DIAGNOSIS — I5033 Acute on chronic diastolic (congestive) heart failure: Secondary | ICD-10-CM

## 2016-07-27 DIAGNOSIS — J440 Chronic obstructive pulmonary disease with acute lower respiratory infection: Secondary | ICD-10-CM | POA: Diagnosis present

## 2016-07-27 DIAGNOSIS — Z79899 Other long term (current) drug therapy: Secondary | ICD-10-CM | POA: Diagnosis not present

## 2016-07-27 DIAGNOSIS — Z87891 Personal history of nicotine dependence: Secondary | ICD-10-CM | POA: Diagnosis not present

## 2016-07-27 DIAGNOSIS — J449 Chronic obstructive pulmonary disease, unspecified: Secondary | ICD-10-CM | POA: Diagnosis present

## 2016-07-27 DIAGNOSIS — R0602 Shortness of breath: Secondary | ICD-10-CM | POA: Diagnosis not present

## 2016-07-27 DIAGNOSIS — Z79890 Hormone replacement therapy (postmenopausal): Secondary | ICD-10-CM | POA: Diagnosis not present

## 2016-07-27 DIAGNOSIS — I480 Paroxysmal atrial fibrillation: Secondary | ICD-10-CM | POA: Diagnosis present

## 2016-07-27 DIAGNOSIS — R531 Weakness: Secondary | ICD-10-CM | POA: Diagnosis present

## 2016-07-27 DIAGNOSIS — G20 Parkinson's disease: Secondary | ICD-10-CM | POA: Diagnosis present

## 2016-07-27 DIAGNOSIS — I130 Hypertensive heart and chronic kidney disease with heart failure and stage 1 through stage 4 chronic kidney disease, or unspecified chronic kidney disease: Secondary | ICD-10-CM | POA: Diagnosis present

## 2016-07-27 DIAGNOSIS — Z950 Presence of cardiac pacemaker: Secondary | ICD-10-CM | POA: Diagnosis not present

## 2016-07-27 DIAGNOSIS — E1122 Type 2 diabetes mellitus with diabetic chronic kidney disease: Secondary | ICD-10-CM | POA: Diagnosis present

## 2016-07-27 DIAGNOSIS — I4892 Unspecified atrial flutter: Secondary | ICD-10-CM | POA: Diagnosis present

## 2016-07-27 DIAGNOSIS — J90 Pleural effusion, not elsewhere classified: Secondary | ICD-10-CM | POA: Diagnosis present

## 2016-07-27 DIAGNOSIS — Z8249 Family history of ischemic heart disease and other diseases of the circulatory system: Secondary | ICD-10-CM | POA: Diagnosis not present

## 2016-07-27 DIAGNOSIS — E1142 Type 2 diabetes mellitus with diabetic polyneuropathy: Secondary | ICD-10-CM | POA: Diagnosis not present

## 2016-07-27 DIAGNOSIS — Z7901 Long term (current) use of anticoagulants: Secondary | ICD-10-CM | POA: Diagnosis not present

## 2016-07-27 DIAGNOSIS — J189 Pneumonia, unspecified organism: Secondary | ICD-10-CM | POA: Diagnosis present

## 2016-07-27 DIAGNOSIS — I2510 Atherosclerotic heart disease of native coronary artery without angina pectoris: Secondary | ICD-10-CM | POA: Diagnosis present

## 2016-07-27 DIAGNOSIS — D649 Anemia, unspecified: Secondary | ICD-10-CM | POA: Diagnosis present

## 2016-07-27 DIAGNOSIS — I509 Heart failure, unspecified: Secondary | ICD-10-CM

## 2016-07-27 DIAGNOSIS — Z8546 Personal history of malignant neoplasm of prostate: Secondary | ICD-10-CM | POA: Diagnosis not present

## 2016-07-27 DIAGNOSIS — Z951 Presence of aortocoronary bypass graft: Secondary | ICD-10-CM | POA: Diagnosis not present

## 2016-07-27 DIAGNOSIS — N179 Acute kidney failure, unspecified: Secondary | ICD-10-CM | POA: Diagnosis present

## 2016-07-27 DIAGNOSIS — I442 Atrioventricular block, complete: Secondary | ICD-10-CM | POA: Diagnosis present

## 2016-07-27 DIAGNOSIS — E1165 Type 2 diabetes mellitus with hyperglycemia: Secondary | ICD-10-CM | POA: Diagnosis not present

## 2016-07-27 DIAGNOSIS — I10 Essential (primary) hypertension: Secondary | ICD-10-CM | POA: Diagnosis not present

## 2016-07-27 DIAGNOSIS — E785 Hyperlipidemia, unspecified: Secondary | ICD-10-CM | POA: Diagnosis present

## 2016-07-27 DIAGNOSIS — N183 Chronic kidney disease, stage 3 (moderate): Secondary | ICD-10-CM | POA: Diagnosis present

## 2016-07-27 LAB — CBC WITH DIFFERENTIAL/PLATELET
Basophils Absolute: 0 10*3/uL (ref 0.0–0.1)
Basophils Relative: 0 %
EOS ABS: 0 10*3/uL (ref 0.0–0.7)
Eosinophils Relative: 1 %
HEMATOCRIT: 31.2 % — AB (ref 39.0–52.0)
HEMOGLOBIN: 9.8 g/dL — AB (ref 13.0–17.0)
LYMPHS PCT: 28 %
Lymphs Abs: 1.5 10*3/uL (ref 0.7–4.0)
MCH: 31.1 pg (ref 26.0–34.0)
MCHC: 31.4 g/dL (ref 30.0–36.0)
MCV: 99 fL (ref 78.0–100.0)
Monocytes Absolute: 1.2 10*3/uL — ABNORMAL HIGH (ref 0.1–1.0)
Monocytes Relative: 24 %
NEUTROS PCT: 47 %
Neutro Abs: 2.5 10*3/uL (ref 1.7–7.7)
Platelets: 146 10*3/uL — ABNORMAL LOW (ref 150–400)
RBC: 3.15 MIL/uL — AB (ref 4.22–5.81)
RDW: 16.4 % — ABNORMAL HIGH (ref 11.5–15.5)
WBC: 5.2 10*3/uL (ref 4.0–10.5)

## 2016-07-27 LAB — COMPREHENSIVE METABOLIC PANEL
ALT: 27 U/L (ref 17–63)
AST: 30 U/L (ref 15–41)
Albumin: 3 g/dL — ABNORMAL LOW (ref 3.5–5.0)
Alkaline Phosphatase: 91 U/L (ref 38–126)
Anion gap: 10 (ref 5–15)
BUN: 32 mg/dL — ABNORMAL HIGH (ref 6–20)
CHLORIDE: 103 mmol/L (ref 101–111)
CO2: 26 mmol/L (ref 22–32)
CREATININE: 1.62 mg/dL — AB (ref 0.61–1.24)
Calcium: 7.8 mg/dL — ABNORMAL LOW (ref 8.9–10.3)
GFR calc non Af Amer: 35 mL/min — ABNORMAL LOW (ref >60)
GFR, EST AFRICAN AMERICAN: 41 mL/min — AB (ref >60)
Glucose, Bld: 165 mg/dL — ABNORMAL HIGH (ref 65–99)
POTASSIUM: 3.5 mmol/L (ref 3.5–5.1)
SODIUM: 139 mmol/L (ref 135–145)
Total Bilirubin: 0.5 mg/dL (ref 0.3–1.2)
Total Protein: 6.1 g/dL — ABNORMAL LOW (ref 6.5–8.1)

## 2016-07-27 LAB — GLUCOSE, CAPILLARY
GLUCOSE-CAPILLARY: 135 mg/dL — AB (ref 65–99)
GLUCOSE-CAPILLARY: 141 mg/dL — AB (ref 65–99)
Glucose-Capillary: 220 mg/dL — ABNORMAL HIGH (ref 65–99)
Glucose-Capillary: 261 mg/dL — ABNORMAL HIGH (ref 65–99)

## 2016-07-27 LAB — TROPONIN I
TROPONIN I: 0.06 ng/mL — AB (ref <0.03)
TROPONIN I: 0.07 ng/mL — AB (ref <0.03)
Troponin I: 0.06 ng/mL (ref <0.03)

## 2016-07-27 LAB — I-STAT TROPONIN, ED: Troponin i, poc: 0.06 ng/mL (ref 0.00–0.08)

## 2016-07-27 MED ORDER — DEXTROSE 5 % IV SOLN
1.0000 g | Freq: Every day | INTRAVENOUS | Status: DC
  Administered 2016-07-28 (×2): 1 g via INTRAVENOUS
  Filled 2016-07-27 (×2): qty 10

## 2016-07-27 MED ORDER — METOPROLOL TARTRATE 100 MG PO TABS: 100.0000 mg | ORAL_TABLET | Freq: Every day | ORAL | Status: DC

## 2016-07-27 MED ORDER — ADULT MULTIVITAMIN W/MINERALS CH
1.0000 | ORAL_TABLET | Freq: Every day | ORAL | Status: DC
  Administered 2016-07-28 – 2016-07-29 (×2): 1 via ORAL
  Filled 2016-07-27 (×3): qty 1

## 2016-07-27 MED ORDER — FUROSEMIDE 10 MG/ML IJ SOLN
20.0000 mg | Freq: Two times a day (BID) | INTRAMUSCULAR | Status: DC
  Administered 2016-07-27 (×2): 20 mg via INTRAVENOUS
  Filled 2016-07-27: qty 2

## 2016-07-27 MED ORDER — ALBUTEROL SULFATE (2.5 MG/3ML) 0.083% IN NEBU
2.5000 mg | INHALATION_SOLUTION | RESPIRATORY_TRACT | Status: DC | PRN
  Administered 2016-07-27: 2.5 mg via RESPIRATORY_TRACT
  Filled 2016-07-27: qty 3

## 2016-07-27 MED ORDER — METOPROLOL TARTRATE 50 MG PO TABS
50.0000 mg | ORAL_TABLET | Freq: Every day | ORAL | Status: DC
  Administered 2016-07-27 – 2016-07-28 (×2): 50 mg via ORAL
  Filled 2016-07-27 (×2): qty 1

## 2016-07-27 MED ORDER — METHYLPREDNISOLONE SODIUM SUCC 125 MG IJ SOLR
125.0000 mg | Freq: Once | INTRAMUSCULAR | Status: AC
  Administered 2016-07-27: 125 mg via INTRAVENOUS
  Filled 2016-07-27: qty 2

## 2016-07-27 MED ORDER — BENZONATATE 100 MG PO CAPS: 100.0000 mg | ORAL_CAPSULE | Freq: Three times a day (TID) | ORAL | Status: DC | PRN

## 2016-07-27 MED ORDER — INSULIN ASPART 100 UNIT/ML ~~LOC~~ SOLN
0.0000 [IU] | Freq: Every day | SUBCUTANEOUS | Status: DC
  Administered 2016-07-27: 3 [IU] via SUBCUTANEOUS
  Administered 2016-07-28: 5 [IU] via SUBCUTANEOUS

## 2016-07-27 MED ORDER — METOPROLOL TARTRATE 100 MG PO TABS
100.0000 mg | ORAL_TABLET | Freq: Every day | ORAL | Status: DC
  Administered 2016-07-27 – 2016-07-29 (×3): 100 mg via ORAL
  Filled 2016-07-27 (×3): qty 1

## 2016-07-27 MED ORDER — GUAIFENESIN ER 600 MG PO TB12
600.0000 mg | ORAL_TABLET | Freq: Two times a day (BID) | ORAL | Status: DC
  Administered 2016-07-27 – 2016-07-29 (×4): 600 mg via ORAL
  Filled 2016-07-27 (×4): qty 1

## 2016-07-27 MED ORDER — INSULIN ASPART 100 UNIT/ML ~~LOC~~ SOLN
0.0000 [IU] | Freq: Three times a day (TID) | SUBCUTANEOUS | Status: DC
  Administered 2016-07-27: 3 [IU] via SUBCUTANEOUS

## 2016-07-27 MED ORDER — POTASSIUM CHLORIDE CRYS ER 10 MEQ PO TBCR
10.0000 meq | EXTENDED_RELEASE_TABLET | Freq: Every day | ORAL | Status: DC
  Administered 2016-07-27 – 2016-07-29 (×3): 10 meq via ORAL
  Filled 2016-07-27 (×5): qty 1

## 2016-07-27 MED ORDER — ATORVASTATIN CALCIUM 20 MG PO TABS
20.0000 mg | ORAL_TABLET | Freq: Every day | ORAL | Status: DC
  Administered 2016-07-27 – 2016-07-29 (×3): 20 mg via ORAL
  Filled 2016-07-27 (×3): qty 1

## 2016-07-27 MED ORDER — ACETAMINOPHEN 325 MG PO TABS
650.0000 mg | ORAL_TABLET | Freq: Four times a day (QID) | ORAL | Status: DC | PRN
  Administered 2016-07-27: 650 mg via ORAL
  Filled 2016-07-27 (×2): qty 2

## 2016-07-27 MED ORDER — CARBIDOPA-LEVODOPA 25-100 MG PO TABS
1.0000 | ORAL_TABLET | Freq: Three times a day (TID) | ORAL | Status: DC
  Administered 2016-07-27 (×3): 1 via ORAL
  Filled 2016-07-27 (×3): qty 1

## 2016-07-27 MED ORDER — IRBESARTAN 300 MG PO TABS
150.0000 mg | ORAL_TABLET | Freq: Every day | ORAL | Status: DC
  Administered 2016-07-27 – 2016-07-29 (×3): 150 mg via ORAL
  Filled 2016-07-27 (×3): qty 1

## 2016-07-27 MED ORDER — ACETAMINOPHEN 650 MG RE SUPP: 650.0000 mg | Freq: Four times a day (QID) | RECTAL | Status: DC | PRN

## 2016-07-27 MED ORDER — DEXTROSE 5 % IV SOLN
500.0000 mg | Freq: Every day | INTRAVENOUS | Status: DC
  Administered 2016-07-28 (×2): 500 mg via INTRAVENOUS
  Filled 2016-07-27 (×2): qty 500

## 2016-07-27 MED ORDER — SODIUM CHLORIDE 0.9 % IV BOLUS (SEPSIS)
250.0000 mL | Freq: Once | INTRAVENOUS | Status: AC
  Administered 2016-07-27: 250 mL via INTRAVENOUS

## 2016-07-27 MED ORDER — CARBIDOPA-LEVODOPA 25-100 MG PO TABS
1.0000 | ORAL_TABLET | Freq: Three times a day (TID) | ORAL | Status: DC
  Administered 2016-07-28 – 2016-07-29 (×5): 1 via ORAL
  Filled 2016-07-27 (×5): qty 1

## 2016-07-27 MED ORDER — APIXABAN 2.5 MG PO TABS
2.5000 mg | ORAL_TABLET | Freq: Two times a day (BID) | ORAL | Status: DC
  Administered 2016-07-27 – 2016-07-29 (×4): 2.5 mg via ORAL
  Filled 2016-07-27 (×4): qty 1

## 2016-07-27 MED ORDER — POLYETHYLENE GLYCOL 3350 17 G PO PACK
17.0000 g | PACK | Freq: Every day | ORAL | Status: DC
  Administered 2016-07-27 – 2016-07-29 (×3): 17 g via ORAL
  Filled 2016-07-27 (×3): qty 1

## 2016-07-27 MED ORDER — PROSIGHT PO TABS
1.0000 | ORAL_TABLET | Freq: Every day | ORAL | Status: DC
  Administered 2016-07-27 – 2016-07-29 (×3): 1 via ORAL
  Filled 2016-07-27 (×3): qty 1

## 2016-07-27 MED ORDER — ONDANSETRON HCL 4 MG PO TABS: 4.0000 mg | ORAL_TABLET | Freq: Four times a day (QID) | ORAL | Status: DC | PRN

## 2016-07-27 MED ORDER — APIXABAN 5 MG PO TABS
5.0000 mg | ORAL_TABLET | Freq: Two times a day (BID) | ORAL | Status: DC
  Administered 2016-07-27: 5 mg via ORAL
  Filled 2016-07-27: qty 1

## 2016-07-27 MED ORDER — ONDANSETRON HCL 4 MG/2ML IJ SOLN: 4.0000 mg | Freq: Four times a day (QID) | INTRAMUSCULAR | Status: DC | PRN

## 2016-07-27 MED ORDER — FUROSEMIDE 10 MG/ML IJ SOLN
40.0000 mg | Freq: Once | INTRAMUSCULAR | Status: AC
  Administered 2016-07-27: 40 mg via INTRAVENOUS
  Filled 2016-07-27: qty 4

## 2016-07-27 MED ORDER — LINAGLIPTIN 5 MG PO TABS
5.0000 mg | ORAL_TABLET | Freq: Every day | ORAL | Status: DC
  Administered 2016-07-27 – 2016-07-29 (×3): 5 mg via ORAL
  Filled 2016-07-27 (×3): qty 1

## 2016-07-27 MED ORDER — INSULIN ASPART 100 UNIT/ML ~~LOC~~ SOLN
0.0000 [IU] | Freq: Three times a day (TID) | SUBCUTANEOUS | Status: DC
  Administered 2016-07-28 (×2): 7 [IU] via SUBCUTANEOUS
  Administered 2016-07-28: 9 [IU] via SUBCUTANEOUS
  Administered 2016-07-29: 5 [IU] via SUBCUTANEOUS
  Administered 2016-07-29: 9 [IU] via SUBCUTANEOUS

## 2016-07-27 NOTE — Progress Notes (Signed)
CRITICAL VALUE ALERT  Critical value received:  Troponin 0.06  Date of notification:  07/27/2016  Time of notification:  8403  Critical value read back:Yes.    Nurse who received alert:  Danae Chen  MD notified (1st page):  Dr. Nichola Sizer  Time of first page:  0559  MD notified (2nd page):  Time of second page:  Responding MD:  awaiting  Time MD responded:  awaiting

## 2016-07-27 NOTE — Discharge Instructions (Signed)

## 2016-07-27 NOTE — H&P (Signed)
History and Physical    [name] Powell ZOX:[id number] DOB: [birth year] DOA: 07/26/2016  PCP: [name], MD  Patient coming from: Home.  Chief Complaint: Weakness.  HPI: [name] Powell is a 83 y.o. male with CAD s/p CABG, sinus arrest and complete heart block (pacemaker dependent), Current episodes of paroxysmal atrial fibrillation and atrial flutter, chronic diastolic heart failure and recently diagnosed Parkinson's disease, diabetes mellitus type 2, chronic anemia was brought to the ER the patient's family found the patient was getting increasingly weak with some cough. Family also felt that patient may have been more weak on the right side and had gone to the PCP and was referred to the ER.   ED Course: In the ER patient was found to be nonfocal. Chest x-ray showed congestion BNP is more than 500 creatinine is elevated from baseline. Patient denies any chest pain shortness of breath. CT head is negative for anything acute. Patient has pacemaker and cannot have MRI brain done. Patient was given Lasix 40 mg IV in the ER and admitted for CHF and weakness.  Review of Systems: As per HPI, rest all negative.   Past Medical History:  Diagnosis Date  . Atrial fibrillation ([place])   . Cancer ([place])   . CHB (complete heart block) ([place])   . Coronary artery disease   . Diabetes mellitus   . Dyslipidemia   . H/O prostate cancer   . Hypertension   . New onset atrial flutter, persistent 07/04/2014  . Pacemaker 12/12/2006   Medtronic adapta  . Pleural effusion, left   . Pneumonia 09/2015  . Prostate cancer ([place])   . 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 Ventricular tachycardia (paroxysmal) ([place]) 03/28/2014    Past Surgical History:  Procedure Laterality Date  . CORONARY ARTERY BYPASS GRAFT  09/04/2001   LIMA to LAD,SVG to diagonal,SVG to ramus intermedius,SVG to PDA  . NM MYOVIEW LTD  01/09/2010   no ischemia  . PACEMAKER  INSERTION  12/12/2006   Medtronic adapta  . PROSTATE SURGERY  2001   cancer  . TONSILLECTOMY       reports that he quit smoking about 54 years ago. His smoking use included Cigarettes and Cigars. He has never used smokeless tobacco. He reports that he drinks alcohol. He reports that he does not use drugs.  Allergies  Allergen Reactions  . Lanoxin [Digoxin] Rash    Eyelid rash  . Mexitil [Mexiletine] Other (See Comments)    unknown    Family History  Problem Relation Age of Onset  . Heart disease Mother     Prior to Admission medications   Medication Sig Start Date End Date Taking? Authorizing Provider  atorvastatin (LIPITOR) 20 MG tablet Take 20 mg by mouth daily.   Yes Historical Provider, MD  benzonatate (TESSALON) 100 MG capsule Take 100 mg by mouth 3 (three) times daily as needed for cough. 07/23/16  Yes Historical Provider, MD  carbidopa-levodopa (SINEMET IR) 25-100 MG tablet Take 1 tablet by mouth 3 (three) times daily. 07/02/16  Yes [name], MD  Cholecalciferol (VITAMIN D PO) Take 10,000 Units by mouth every Friday. On Friday    Yes Historical Provider, MD  ELIQUIS 5 MG TABS tablet TAKE 1 TABLET TWICE DAILY 07/13/16  Yes [name], MD  furosemide (LASIX) 20 MG tablet TAKE 1 TABLET ON MONDAY, WEDNESDAY, AND FRIDAY AND TAKE 2 TABLETS (40MG ) ON TUESDAY, [place], [place],  AND SUNDAY 07/01/16  Yes [name], MD  glimepiride (AMARYL) 4 MG tablet Take 4 mg by mouth daily before breakfast.   Yes Historical Provider, MD  JANUVIA 50 MG tablet Take 1 tablet by mouth every evening.  05/27/15  Yes Historical Provider, MD  metFORMIN (GLUCOPHAGE-XR) 500 MG 24 hr tablet Take 500 mg by mouth daily with breakfast.  02/10/15  Yes Historical Provider, MD  metoprolol (LOPRESSOR) 50 MG tablet TAKE 2 TABLETS (100MG ) BY MOUTH IN THE MORNING AND 1 TABLET (50MG ) IN THE EVENING. 04/29/16  Yes [name], MD  mometasone (ELOCON) 0.1 % cream APPLY TO LEGS AS NEEDED FOR FOOT DERMATITIS  01/10/15  Yes Historical Provider, MD  Multiple Vitamin (MULTIVITAMIN WITH MINERALS) TABS tablet Take 1 tablet by mouth daily.   Yes Historical Provider, MD  Multiple Vitamins-Minerals (PRESERVISION AREDS) CAPS Take 1 capsule by mouth 2 (two) times daily.   Yes Historical Provider, MD  polyethylene glycol (MIRALAX / GLYCOLAX) packet Take 17 g by mouth daily. To prevent constipation    Yes Historical Provider, MD  potassium chloride (K-DUR) 10 MEQ tablet Take 1 tablet (10 mEq total) by mouth daily. Tues, Thurs,Sat , Sunday Patient taking differently: Take 10 mEq by mouth daily. Tues, Thurs,Sat , Sunday ans 1/2 tablet on mon wed fri 02/23/16  Yes [name], MD  valsartan (DIOVAN) 160 MG tablet TAKE 1 TABLET EVERY DAY 06/22/16  Yes [name], MD    Physical Exam: Vitals:   07/26/16 2330 07/27/16 0000 07/27/16 0100 07/27/16 0158  BP: (!) 152/89 129/60 (!) 145/59 (!) 149/55  Pulse: (!) 117 69 68 70  Resp: (!) 24 (!) 24 (!) 25 20  Temp:      TempSrc:      SpO2: 90% 91% 90% 93%  Weight:    86.1 kg (189 lb 12.8 oz)  Height:    5\' 9"  (1.753 m)      Constitutional: Moderately built and nourished. Vitals:   07/26/16 2330 07/27/16 0000 07/27/16 0100 07/27/16 0158  BP: (!) 152/89 129/60 (!) 145/59 (!) 149/55  Pulse: (!) 117 69 68 70  Resp: (!) 24 (!) 24 (!) 25 20  Temp:      TempSrc:      SpO2: 90% 91% 90% 93%  Weight:    86.1 kg (189 lb 12.8 oz)  Height:    5\' 9"  (1.753 m)   Eyes: Anicteric. No pallor. ENMT: No discharge from the ears eyes nose and mouth. Neck: No mass felt. No neck rigidity. Respiratory: No rhonchi or crepitations. Cardiovascular: S1-S2 no murmurs appreciated. Abdomen: Soft nontender bowel sounds present. Musculoskeletal: Bilateral lower extremity edema. Skin: No rash. Neurologic: Alert awake oriented to time place and person. Moves all extremities. Appears generally weak. No facial asymmetry. Tongue is midline. Pupils equal and reacting to  light. Psychiatric: Appears normal. Normal affect.   Labs on Admission: I have personally reviewed following labs and imaging studies  CBC:  Recent Labs Lab 07/26/16 1735  WBC 6.1  HGB 11.2*  HCT 35.8*  MCV 99.4  PLT 671   Basic Metabolic Panel:  Recent Labs Lab 07/26/16 1735  NA 139  K 4.0  CL 103  CO2 25  GLUCOSE 111*  BUN 32*  CREATININE 1.59*  CALCIUM 8.4*   GFR: Estimated Creatinine Clearance: 32.2 mL/min (A) (by C-G formula based on SCr of 1.59 mg/dL (H)). Liver Function Tests:  Recent Labs Lab 07/26/16 1735  AST 40  ALT 15*  ALKPHOS 116  BILITOT  0.6  PROT 7.6  ALBUMIN 3.7    Recent Labs Lab 07/26/16 1735  LIPASE 34   No results for input(s): AMMONIA in the last 168 hours. Coagulation Profile:  Recent Labs Lab 07/26/16 2048  INR 1.34   Cardiac Enzymes: No results for input(s): CKTOTAL, CKMB, CKMBINDEX, TROPONINI in the last 168 hours. BNP (last 3 results) No results for input(s): PROBNP in the last 8760 hours. HbA1C: No results for input(s): HGBA1C in the last 72 hours. CBG: No results for input(s): GLUCAP in the last 168 hours. Lipid Profile: No results for input(s): CHOL, HDL, LDLCALC, TRIG, CHOLHDL, LDLDIRECT in the last 72 hours. Thyroid Function Tests:  Recent Labs  07/26/16 2048  TSH 1.957   Anemia Panel: No results for input(s): VITAMINB12, FOLATE, FERRITIN, TIBC, IRON, RETICCTPCT in the last 72 hours. Urine analysis:    Component Value Date/Time   COLORURINE YELLOW 07/26/2016 2330   APPEARANCEUR CLEAR 07/26/2016 2330   LABSPEC 1.014 07/26/2016 2330   PHURINE 5.0 07/26/2016 2330   GLUCOSEU NEGATIVE 07/26/2016 2330   HGBUR SMALL (A) 07/26/2016 2330   BILIRUBINUR NEGATIVE 07/26/2016 2330   KETONESUR 5 (A) 07/26/2016 2330   PROTEINUR 100 (A) 07/26/2016 2330   UROBILINOGEN 1.0 02/19/2015 2354   NITRITE NEGATIVE 07/26/2016 2330   LEUKOCYTESUR NEGATIVE 07/26/2016 2330   Sepsis  Labs: @LABRCNTIP (procalcitonin:4,lacticidven:4) )No results found for this or any previous visit (from the past 240 hour(s)).   Radiological Exams on Admission: Dg Chest 2 View  Result Date: 07/26/2016 CLINICAL DATA:  Hypertension.  CABG. EXAM: CHEST  2 VIEW COMPARISON:  09/18/2015 . FINDINGS: Cardiac pacer noted in stable position. Stable cardiomegaly. Diffuse mild bilateral interstitial prominence noted. Small left pleural effusion. Mild CHF cannot be completely excluded. IMPRESSION: 1.  Cardiac pacer noted in stable position.  Prior CABG. 2. Cardiomegaly with pulmonary vascular prominence and very mild interstitial prominence. Small left pleural effusion cannot be excluded. Findings consist with mild CHF . Electronically Signed   By: Marcello Moores  Register   On: 07/26/2016 17:21   Ct Head Wo Contrast  Result Date: 07/26/2016 CLINICAL DATA:  Right leg weakness EXAM: CT HEAD WITHOUT CONTRAST TECHNIQUE: Contiguous axial images were obtained from the base of the skull through the vertex without intravenous contrast. COMPARISON:  08/25/2015 FINDINGS: Brain: Diffuse atrophic changes and chronic white matter ischemic changes are seen. No findings to suggest acute hemorrhage, acute infarction or space-occupying mass lesion are noted. Vascular: No hyperdense vessel or unexpected calcification. Skull: Normal. Negative for fracture or focal lesion. Sinuses/Orbits: No acute finding. Other: None. IMPRESSION: Chronic atrophic and ischemic changes without acute abnormality. Electronically Signed   By: Inez Catalina M.D.   On: 07/26/2016 21:22    EKG: Independently reviewed. Paced rhythm.  Assessment/Plan Principal Problem:   Weakness generalized Active Problems:   CAD s/p CABG 2003   CHB (complete heart block) (HCC)   HTN (hypertension)   Chronic diastolic congestive heart failure (H[facility])   Acute on chronic diastolic congestive heart failure (H[facility])   Atrial fibrillation (H[facility])   CHF (congestive heart failure)  ([place])    1. Generalized weakness - probably from deconditioning. Physical therapy consult. Patient is also having Parkinson's disease so closely observe for any worsening. CT head was unremarkable. Unable to do MRI due to pacemaker. 2. Acute on chronic diastolic CHF last EF measured in May 2017 was 60-65 present with grade 1 diastolic dysfunction - Lasix 20 mg IV every 12 has been ordered. If creatinine worsens may have to hold  ARB. Check troponin. 3. Paroxysmal atrial fibrillation with history of pacemaker placement for complete heart block - on Apixaban which will be continued. Continue beta blockers. 4. Chronic anemia - follow CBC. 5. Chronic kidney disease stage III with mild worsening of creatinine - there is any further worsening of testing may have to hold ARB. 6. Diabetes mellitus2 - I have placed patient on sliding-scale coverage and continue Januvia. Hold Amaryl and metformin. 7. Parkinson's disease continue Sinemet.   DVT prophylaxis: Apixaban. Code Status: Full code.  Family Communication: No family at the bedside.  Disposition Plan: To be determined.  Consults called: Physical therapy.  Admission status: Observation.    Rise Patience MD [facility] Pager [phone].  If 7PM-7AM, please contact night-coverage [email or web address] Password [email or web address]  07/27/2016, 4:40 AM

## 2016-07-27 NOTE — Progress Notes (Signed)
Paged by nurse that patient was having wheezing b/l and secretions. Dg chest 1 view Solumedrol x1 Nebs mucinex ordered Eulogio Bear DO

## 2016-07-27 NOTE — Evaluation (Signed)
Physical Therapy Evaluation Patient Details Name: [name] Powell MRN: [id number] DOB: 04-17-1923 Today's Date: 07/27/2016   History of Present Illness  Pt adm with acute on chronic chf and weakness. PMH - chf, Parkinsons, cabg, pacer, dm  Clinical Impression  Pt admitted with above diagnosis and presents to PT with functional limitations due to deficits listed below (See PT problem list). Pt needs skilled PT to maximize independence and safety to allow discharge to home with wife and HHPT. Feel pt can benefit from further PT to decr burden of care and reduce risk of falls.      Follow Up Recommendations Home health PT;Supervision for mobility/OOB    Equipment Recommendations  None recommended by PT    Recommendations for Other Services       Precautions / Restrictions Precautions Precautions: Fall Restrictions Weight Bearing Restrictions: No      Mobility  Bed Mobility Overal bed mobility: Needs Assistance Bed Mobility: Supine to Sit     Supine to sit: Min assist     General bed mobility comments: Assist to elevate trunk into sitting  Transfers Overall transfer level: Needs assistance Equipment used: Straight cane Transfers: Sit to/from Stand Sit to Stand: Min guard         General transfer comment: Assist for balance and incr time to rise  Ambulation/Gait Ambulation/Gait assistance: Min guard Ambulation Distance (Feet): 150 Feet Assistive device: Straight cane Gait Pattern/deviations: Step-through pattern;Decreased stride length;Trunk flexed;Drifts right/left Gait velocity: Too fast for abilities   General Gait Details: Assist for balance and safety. Verbal cues to slow down and to stand more erect  Stairs            Wheelchair Mobility    Modified Rankin (Stroke Patients Only)       Balance Overall balance assessment: Needs assistance Sitting-balance support: No upper extremity supported;Feet supported Sitting balance-Leahy Scale: Good      Standing balance support: Single extremity supported Standing balance-Leahy Scale: Poor Standing balance comment: UE support                             Pertinent Vitals/Pain Pain Assessment: No/denies pain    Home Living Family/patient expects to be discharged to:: Private residence Living Arrangements: Spouse/significant other Available Help at Discharge: Family;Available 24 hours/day Type of Home: House Home Access: Stairs to enter Entrance Stairs-Rails: None (having one built) Technical brewer of Steps: 3-4 Home Layout: Two level;Able to live on main level with bedroom/bathroom Home Equipment: [name] - single point;Shower seat;Walker - 2 wheels      Prior Function Level of Independence: Needs assistance   Gait / Transfers Assistance Needed: amb with cane. At times requires assist.           Hand Dominance   Dominant Hand: Right    Extremity/Trunk Assessment   Upper Extremity Assessment Upper Extremity Assessment: Generalized weakness    Lower Extremity Assessment Lower Extremity Assessment: Generalized weakness       Communication   Communication: HOH  Cognition Arousal/Alertness: Awake/alert Behavior During Therapy: WFL for tasks assessed/performed Overall Cognitive Status: No family/caregiver present to determine baseline cognitive functioning Area of Impairment: Memory;Safety/judgement;Problem solving     Memory: Decreased short-term memory   Safety/Judgement: Decreased awareness of deficits;Decreased awareness of safety   Problem Solving: Requires verbal cues      General Comments      Exercises     Assessment/Plan    PT Assessment Patient needs continued PT  services  PT Problem List Decreased strength;Decreased balance;Decreased activity tolerance;Decreased mobility;Decreased safety awareness       PT Treatment Interventions DME instruction;Gait training;Functional mobility training;Therapeutic  activities;Therapeutic exercise;Balance training;Patient/family education    PT Goals (Current goals can be found in the Care Plan section)  Acute Rehab PT Goals Patient Stated Goal: return home PT Goal Formulation: With patient Time For Goal Achievement: 08/03/16 Potential to Achieve Goals: Good    Frequency Min 3X/week   Barriers to discharge        Co-evaluation               End of Session Equipment Utilized During Treatment: Gait belt Activity Tolerance: Patient tolerated treatment well Patient left: in chair;with call bell/phone within reach;with chair alarm set Nurse Communication: Mobility status PT Visit Diagnosis: Unsteadiness on feet (R26.81);Muscle weakness (generalized) (M62.81)    Functional Assessment Tool Used: AM-PAC 6 Clicks Basic Mobility Functional Limitation: Mobility: Walking and moving around Mobility: Walking and Moving Around Current Status (I5027): At least 60 percent but less than 80 percent impaired, limited or restricted Mobility: Walking and Moving Around Goal Status [phone]): At least 20 percent but less than 40 percent impaired, limited or restricted    Time: 1020-1040 PT Time Calculation (min) (ACUTE ONLY): 20 min   Charges:   PT Evaluation $PT Eval Moderate Complexity: 1 Procedure     PT G Codes:   PT G-Codes **NOT FOR INPATIENT CLASS** Functional Assessment Tool Used: AM-PAC 6 Clicks Basic Mobility Functional Limitation: Mobility: Walking and moving around Mobility: Walking and Moving Around Current Status (N8676): At least 60 percent but less than 80 percent impaired, limited or restricted Mobility: Walking and Moving Around Goal Status [id number]): At least 20 percent but less than 40 percent impaired, limited or restricted     Ginger Blue 07/27/2016, 2:14 PM [facility] PT [phone]

## 2016-07-27 NOTE — Progress Notes (Signed)
Seen and examined by Dr. Kakrakandy. 

## 2016-07-27 NOTE — Progress Notes (Addendum)
Patient with difficulty breathing, wheezing bil upper lungs.Trying to cough out secretions but has a weak cough. Vital done. Patient sitting up in bed and encouraged to cough. Suction set up and family and patient  educated on how to use. MD made aware- see new orders.

## 2016-07-27 NOTE — Care Management Note (Signed)
Case Management Note  Patient Details  Name: [name] Powell MRN: [id number] Date of Birth: 02-05-1923  Subjective/Objective:    Admitted with weakness, CHF              Action/Plan: Patient lives at home with spouse; PCP: [name], MD; has private insurance with [employer]; pharmacy of choice is [facility]; pt/spouse report no problem getting medication; he had home 02 but was not using it and returned it; walker and cane at home; Patient has used [facility] in the past for [facility] service and is requesting to use them again; referral made to [facility] as requested; CM will continue to follow for DCP  Expected Discharge Date:    possibly 07/28/2016              Expected Discharge Plan:  [place]  Discharge planning Services  CM Consult  HH Arranged:  RN, PT, OT [facility] Agency:  [facility]  Status of Service:  In process, will continue to follow  Sherrilyn Rist [phone] 07/27/2016, 3:53 PM

## 2016-07-27 NOTE — Progress Notes (Signed)
Dr. Hal Hope paged with this admission.

## 2016-07-27 NOTE — Progress Notes (Signed)
Pt admitted from the ED per stretcher assisted by the ED RN, alert oriented to person,disoriented to place,time and situation( pt stated he is in [place]). Wife at the bedside. Pt denies chest pain, denies nausea and vomiting, not in respiratory distress. Placed on telemetry box MX40-08 and verified by the NT. Oriented to room and call bell. Bed alarm on, call bell within reach. Will continue to monitor pt   07/27/16 0158  Vitals  BP (!) 149/55  BP Location Left Arm  BP Method Automatic  Patient Position (if appropriate) Lying  Pulse Rate 70  Pulse Rate Source Monitor  Cardiac Rhythm Ventricular paced  Resp 20  Oxygen Therapy  SpO2 93 %  O2 Device Room Air  Pain Assessment  Pain Assessment No/denies pain  PCA/Epidural/Spinal Assessment  Respiratory Pattern Regular;Dyspnea with exertion  Height and Weight  Height 5\' 9"  (1.753 m)  Weight 86.1 kg (189 lb 12.8 oz) (bed; pt has parkinsons and unsteady)  Type of Scale Used Bed  Type of Weight Actual  BSA (Calculated - sq m) 2.05 sq meters  BMI (Calculated) 28.1  Weight in (lb) to have BMI = 25 168.9

## 2016-07-27 NOTE — Progress Notes (Signed)
Patient admitted after midnight, please see H&P.  X ray suggestive of volume overload.  Gentle diuresis and PT Eval.  CE flat but will continue to trend.  Called multiple family members with no answer.   Eulogio Bear DO

## 2016-07-28 DIAGNOSIS — J189 Pneumonia, unspecified organism: Secondary | ICD-10-CM

## 2016-07-28 DIAGNOSIS — I10 Essential (primary) hypertension: Secondary | ICD-10-CM

## 2016-07-28 DIAGNOSIS — E1165 Type 2 diabetes mellitus with hyperglycemia: Secondary | ICD-10-CM

## 2016-07-28 LAB — GLUCOSE, CAPILLARY
GLUCOSE-CAPILLARY: 346 mg/dL — AB (ref 65–99)
Glucose-Capillary: 307 mg/dL — ABNORMAL HIGH (ref 65–99)
Glucose-Capillary: 389 mg/dL — ABNORMAL HIGH (ref 65–99)
Glucose-Capillary: 398 mg/dL — ABNORMAL HIGH (ref 65–99)

## 2016-07-28 LAB — BASIC METABOLIC PANEL
Anion gap: 13 (ref 5–15)
BUN: 33 mg/dL — ABNORMAL HIGH (ref 6–20)
CO2: 24 mmol/L (ref 22–32)
CREATININE: 1.61 mg/dL — AB (ref 0.61–1.24)
Calcium: 7.7 mg/dL — ABNORMAL LOW (ref 8.9–10.3)
Chloride: 99 mmol/L — ABNORMAL LOW (ref 101–111)
GFR calc non Af Amer: 35 mL/min — ABNORMAL LOW (ref >60)
GFR, EST AFRICAN AMERICAN: 41 mL/min — AB (ref >60)
Glucose, Bld: 299 mg/dL — ABNORMAL HIGH (ref 65–99)
POTASSIUM: 3.9 mmol/L (ref 3.5–5.1)
SODIUM: 136 mmol/L (ref 135–145)

## 2016-07-28 LAB — EXPECTORATED SPUTUM ASSESSMENT W GRAM STAIN, RFLX TO RESP C: Special Requests: NORMAL

## 2016-07-28 LAB — URINE CULTURE: Culture: NO GROWTH

## 2016-07-28 LAB — CBC
HCT: 32.8 % — ABNORMAL LOW (ref 39.0–52.0)
HEMOGLOBIN: 10.3 g/dL — AB (ref 13.0–17.0)
MCH: 31 pg (ref 26.0–34.0)
MCHC: 31.4 g/dL (ref 30.0–36.0)
MCV: 98.8 fL (ref 78.0–100.0)
Platelets: 150 10*3/uL (ref 150–400)
RBC: 3.32 MIL/uL — AB (ref 4.22–5.81)
RDW: 16.2 % — ABNORMAL HIGH (ref 11.5–15.5)
WBC: 8.3 10*3/uL (ref 4.0–10.5)

## 2016-07-28 LAB — EXPECTORATED SPUTUM ASSESSMENT W REFEX TO RESP CULTURE

## 2016-07-28 MED ORDER — PREDNISONE 50 MG PO TABS
50.0000 mg | ORAL_TABLET | Freq: Every day | ORAL | Status: DC
Start: 2016-07-28 — End: 2016-07-29
  Administered 2016-07-28: 50 mg via ORAL
  Filled 2016-07-28: qty 1

## 2016-07-28 MED ORDER — PREDNISONE 50 MG PO TABS: 50.0000 mg | ORAL_TABLET | Freq: Every day | ORAL | Status: DC

## 2016-07-28 MED ORDER — INSULIN GLARGINE 100 UNIT/ML ~~LOC~~ SOLN
15.0000 [IU] | Freq: Every day | SUBCUTANEOUS | Status: DC
  Administered 2016-07-28 – 2016-07-29 (×2): 15 [IU] via SUBCUTANEOUS
  Filled 2016-07-28 (×2): qty 0.15

## 2016-07-28 MED ORDER — FUROSEMIDE 40 MG PO TABS
40.0000 mg | ORAL_TABLET | Freq: Every day | ORAL | Status: DC
  Administered 2016-07-28 – 2016-07-29 (×2): 40 mg via ORAL
  Filled 2016-07-28 (×2): qty 1

## 2016-07-28 NOTE — Progress Notes (Signed)
Page to DR Eliseo Squires.  3e18 rectal temp recheck is 96.6. please advise preferred interventions if any.

## 2016-07-28 NOTE — Progress Notes (Signed)
Lab reports that sputum sample sent is unacceptable.  Have given info to attending nurse.

## 2016-07-28 NOTE — Progress Notes (Signed)
PROGRESS NOTE    [name]  VOH:[id number] DOB: 07-Jun-1922 DOA: 07/26/2016 PCP: [name], MD   Outpatient Specialists:     Brief Narrative:  [name] Powell is a 83 y.o. male with CAD s/p CABG, sinus arrest and complete heart block (pacemaker dependent), Current episodes of paroxysmal atrial fibrillation and atrial flutter, chronic diastolic heart failure and recently diagnosed Parkinson's disease, diabetes mellitus type 2, chronic anemia was brought to the ER the patient's family found the patient was getting increasingly weak with some cough. Family also felt that patient may have been more weak on the right side and had gone to the PCP and was referred to the ER.  Family states similar to last hospitalization with PNA.   Assessment & Plan:   Principal Problem:   Weakness generalized Active Problems:   CAD s/p CABG 2003   CHB (complete heart block) (HCC)   HTN (hypertension)   Chronic diastolic congestive heart failure (HCC)   Acute on chronic diastolic congestive heart failure (HCC)   Atrial fibrillation (HCC)   CHF (congestive heart failure) (HCC)   Generalized weakness  CAP -rocephin/azithromycin -nebs -mucinex -quick titration of steroids  Generalized weakness -prob from infection + age -PT eval -lives at home with wife  Acute on chronic diastolic CHF last EF measured in May 2017 was 60-65 present with grade 1 diastolic dysfunction - resume home lasix (d/c IV)  Paroxysmal atrial fibrillation with history of pacemaker placement for complete heart block  -Apixaban which will be continued. Continue beta blockers  Chronic anemia  -follow CBC.  Chronic kidney disease stage III -similar to outpatient Cr  Diabetes mellitus2 -  -SSI -add lantus while home meds held.  Parkinson's disease continue Sinemet -PT eval -per family no issues with swallowing    DVT prophylaxis:  Fully anticoagulated   Code Status: Full Code   Family  Communication: Multiple at bedside and on phone  Disposition Plan:     Consultants:    Subjective: + cough-- wet Feeling better today-- last night felt the worst  Objective: Vitals:   07/28/16 0015 07/28/16 0432 07/28/16 0915 07/28/16 1050  BP:  (!) 121/56    Pulse:  63    Resp:  18    Temp: 99.6 F (37.6 C) (!) 96.3 F (35.7 C) (!) 96.6 F (35.9 C) 97.1 F (36.2 C)  TempSrc: Rectal Rectal Rectal Rectal  SpO2:  99%    Weight:      Height:        Intake/Output Summary (Last 24 hours) at 07/28/16 1115 Last data filed at 07/28/16 6948  Gross per 24 hour  Intake              780 ml  Output              700 ml  Net               80 ml   Filed Weights   07/27/16 0158  Weight: 86.1 kg (189 lb 12.8 oz)    Examination:  General exam: Appears calm and comfortable  Respiratory system: Coarse breath sounds L>R Cardiovascular system: S1 & S2 heard, RRR. No JVD, murmurs, rubs, gallops or clicks. No pedal edema. Gastrointestinal system: Abdomen is nondistended, soft and nontender. No organomegaly or masses felt. Normal bowel sounds heard. Central nervous system: Alert - weak appearing Skin: No rashes, lesions or ulcers      Data Reviewed: I have personally reviewed following labs and imaging studies  CBC:  Recent Labs Lab 07/26/16 1735 07/27/16 0449 07/28/16 0509  WBC 6.1 5.2 8.3  NEUTROABS  --  2.5  --   HGB 11.2* 9.8* 10.3*  HCT 35.8* 31.2* 32.8*  MCV 99.4 99.0 98.8  PLT 160 146* 865   Basic Metabolic Panel:  Recent Labs Lab 07/26/16 1735 07/27/16 0449 07/28/16 0509  NA 139 139 136  K 4.0 3.5 3.9  CL 103 103 99*  CO2 25 26 24   GLUCOSE 111* 165* 299*  BUN 32* 32* 33*  CREATININE 1.59* 1.62* 1.61*  CALCIUM 8.4* 7.8* 7.7*   GFR: Estimated Creatinine Clearance: 31.8 mL/min (A) (by C-G formula based on SCr of 1.61 mg/dL (H)). Liver Function Tests:  Recent Labs Lab 07/26/16 1735 07/27/16 0449  AST 40 30  ALT 15* 27  ALKPHOS 116 91    BILITOT 0.6 0.5  PROT 7.6 6.1*  ALBUMIN 3.7 3.0*    Recent Labs Lab 07/26/16 1735  LIPASE 34   No results for input(s): AMMONIA in the last 168 hours. Coagulation Profile:  Recent Labs Lab 07/26/16 2048  INR 1.34   Cardiac Enzymes:  Recent Labs Lab 07/27/16 0449 07/27/16 0949 07/27/16 1604  TROPONINI 0.06* 0.06* 0.07*   BNP (last 3 results) No results for input(s): PROBNP in the last 8760 hours. HbA1C: No results for input(s): HGBA1C in the last 72 hours. CBG:  Recent Labs Lab 07/27/16 0733 07/27/16 1121 07/27/16 1627 07/27/16 2143 07/28/16 0731  GLUCAP 135* 220* 141* 261* 307*   Lipid Profile: No results for input(s): CHOL, HDL, LDLCALC, TRIG, CHOLHDL, LDLDIRECT in the last 72 hours. Thyroid Function Tests:  Recent Labs  07/26/16 2048  TSH 1.957   Anemia Panel: No results for input(s): VITAMINB12, FOLATE, FERRITIN, TIBC, IRON, RETICCTPCT in the last 72 hours. Urine analysis:    Component Value Date/Time   COLORURINE YELLOW 07/26/2016 2330   APPEARANCEUR CLEAR 07/26/2016 2330   LABSPEC 1.014 07/26/2016 2330   PHURINE 5.0 07/26/2016 2330   GLUCOSEU NEGATIVE 07/26/2016 2330   HGBUR SMALL (A) 07/26/2016 2330   BILIRUBINUR NEGATIVE 07/26/2016 2330   KETONESUR 5 (A) 07/26/2016 2330   PROTEINUR 100 (A) 07/26/2016 2330   UROBILINOGEN 1.0 02/19/2015 2354   NITRITE NEGATIVE 07/26/2016 2330   LEUKOCYTESUR NEGATIVE 07/26/2016 2330     ) Recent Results (from the past 240 hour(s))  Urine culture     Status: None   Collection Time: 07/26/16 11:30 PM  Result Value Ref Range Status   Specimen Description URINE, RANDOM  Final   Special Requests NONE  Final   Culture NO GROWTH  Final   Report Status 07/28/2016 FINAL  Final      Anti-infectives    Start     Dose/Rate Route Frequency Ordered Stop   07/27/16 2300  cefTRIAXone (ROCEPHIN) 1 g in dextrose 5 % 50 mL IVPB     1 g 100 mL/hr over 30 Minutes Intravenous Daily at bedtime 07/27/16 2234      07/27/16 2300  azithromycin (ZITHROMAX) 500 mg in dextrose 5 % 250 mL IVPB     500 mg 250 mL/hr over 60 Minutes Intravenous Daily at bedtime 07/27/16 2234         Radiology Studies: Dg Chest 2 View  Result Date: 07/26/2016 CLINICAL DATA:  Hypertension.  CABG. EXAM: CHEST  2 VIEW COMPARISON:  09/18/2015 . FINDINGS: Cardiac pacer noted in stable position. Stable cardiomegaly. Diffuse mild bilateral interstitial prominence noted. Small left pleural effusion. Mild CHF cannot be completely  excluded. IMPRESSION: 1.  Cardiac pacer noted in stable position.  Prior CABG. 2. Cardiomegaly with pulmonary vascular prominence and very mild interstitial prominence. Small left pleural effusion cannot be excluded. Findings consist with mild CHF . Electronically Signed   By: Marcello Moores  Register   On: 07/26/2016 17:21   Ct Head Wo Contrast  Result Date: 07/26/2016 CLINICAL DATA:  Right leg weakness EXAM: CT HEAD WITHOUT CONTRAST TECHNIQUE: Contiguous axial images were obtained from the base of the skull through the vertex without intravenous contrast. COMPARISON:  08/25/2015 FINDINGS: Brain: Diffuse atrophic changes and chronic white matter ischemic changes are seen. No findings to suggest acute hemorrhage, acute infarction or space-occupying mass lesion are noted. Vascular: No hyperdense vessel or unexpected calcification. Skull: Normal. Negative for fracture or focal lesion. Sinuses/Orbits: No acute finding. Other: None. IMPRESSION: Chronic atrophic and ischemic changes without acute abnormality. Electronically Signed   By: Inez Catalina M.D.   On: 07/26/2016 21:22   Dg Chest Port 1 View  Result Date: 07/27/2016 CLINICAL DATA:  Shortness of breath, hypertension, diabetes, known coronary disease EXAM: PORTABLE CHEST 1 VIEW COMPARISON:  07/26/2016 FINDINGS: Marked cardiomegaly, similar to the prior study. Previous coronary bypass changes and left subclavian pacer. Increased left lower lobe retrocardiac opacity obscuring  the left hemidiaphragm suspicious for left basilar consolidation or pneumonia. Right lung is clear. Exam is rotated to the left. Atherosclerosis of the aorta. IMPRESSION: Increased left lower lobe retrocardiac opacity concerning for consolidation or pneumonia. Cardiomegaly without CHF Thoracic aortic atherosclerosis Electronically Signed   By: Jerilynn Mages.  Shick M.D.   On: 07/27/2016 21:05        Scheduled Meds: . apixaban  2.5 mg Oral BID  . atorvastatin  20 mg Oral Daily  . azithromycin  500 mg Intravenous QHS  . carbidopa-levodopa  1 tablet Oral TID WC  . cefTRIAXone (ROCEPHIN)  IV  1 g Intravenous QHS  . furosemide  40 mg Oral Daily  . guaiFENesin  600 mg Oral BID  . insulin aspart  0-5 Units Subcutaneous QHS  . insulin aspart  0-9 Units Subcutaneous TID WC  . insulin glargine  15 Units Subcutaneous Daily  . irbesartan  150 mg Oral Daily  . linagliptin  5 mg Oral Daily  . metoprolol tartrate  100 mg Oral Daily   And  . metoprolol tartrate  50 mg Oral QHS  . multivitamin  1 tablet Oral Daily  . multivitamin with minerals  1 tablet Oral Daily  . polyethylene glycol  17 g Oral Daily  . potassium chloride  10 mEq Oral Daily  . predniSONE  50 mg Oral Q breakfast   Continuous Infusions:   LOS: 1 day    Time spent: 35 min    [place]ak, DO [facility] Pager [phone]  If 7PM-7AM, please contact night-coverage [email or web address] Password [facility] 07/28/2016, 11:15 AM

## 2016-07-28 NOTE — Consult Note (Signed)
   [facility] Inpatient Consult   07/28/2016  NAPHTALI ZYWICKI 1923-03-07 [id number]  Patient was assessed for restart of [place] [facility] for community follow up needs. Patient was previously active with [place] [facility] briefly but declined services.  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  Met with patient, wife, and other family member at bedside regarding being restarted with [facility] for needs.  Wife denies any issues with medication management, transportation, or community resource needs at this time.  They endorse Dr. [name] as the primary care provider.    There is a consent on file.  Patient has been assigned EMMI HF post hospital case by inpatient RNCM.  Wife accepted a brochure with magnet for 24 hour nurse advise line for needs at anytime as a beneficiary.  Wife verbalized understanding but felt home health was all that was needed at this time.  Of note, [facility] Care Management services does not replace or interfere with any services that are arranged by inpatient case management or social work. For additional questions or referrals please contact:  Natividad Brood, RN BSN [facility] Liaison  [phone] business mobile phone Toll free office [phone]

## 2016-07-28 NOTE — Progress Notes (Signed)
Physical Therapy Treatment Patient Details Name: [name] Powell MRN: [id number] DOB: Jul 16, 1922 Today's Date: 07/28/2016    History of Present Illness Pt adm with acute on chronic chf and weakness. PMH - chf, Parkinsons, cabg, pacer, dm    PT Comments    Progressing steadily.  Emphasis on determining the correct AD for his present status which is presently the RW due to weakness and deconditioning.   Follow Up Recommendations  Home health PT;Supervision for mobility/OOB     Equipment Recommendations  None recommended by PT    Recommendations for Other Services       Precautions / Restrictions Precautions Precautions: Fall    Mobility  Bed Mobility Overal bed mobility: Needs Assistance Bed Mobility: Supine to Sit           General bed mobility comments: moderate use of the rail, minimal truncal assist.  Transfers Overall transfer level: Needs assistance Equipment used: Rolling walker (2 wheeled);Straight cane;1 person hand held assist Transfers: Sit to/from Stand Sit to Stand: Min guard         General transfer comment: guard for safety, as he fatigued needed min stability assist  Ambulation/Gait Ambulation/Gait assistance: Min assist Ambulation Distance (Feet): 70 Feet (with no AD, 150 feet with RW and addt 150 feet with std cane) Assistive device: Rolling walker (2 wheeled);Straight cane;1 person hand held assist Gait Pattern/deviations: Step-through pattern;Decreased step length - right;Decreased step length - left;Decreased stride length Gait velocity: moderate speed Gait velocity interpretation: at or above normal speed for age/gender General Gait Details: Needed min assist throughout with No AD, Pelvic/truncal weaknesses more pronounced without AD and less clearance or R LE shuffle with swing through.  Pt the most safe with RW and was generally steady, but noticeable fatigue.  The cane was unable to steady pt;s gait enough when he was fatigued.      Stairs            Wheelchair Mobility    Modified Rankin (Stroke Patients Only)       Balance Overall balance assessment: Needs assistance Sitting-balance support: No upper extremity supported;Feet supported Sitting balance-Leahy Scale: Good     Standing balance support: Single extremity supported Standing balance-Leahy Scale: Poor Standing balance comment: safe with at least 1 extremity support                    Cognition Arousal/Alertness: Awake/alert Behavior During Therapy: WFL for tasks assessed/performed Overall Cognitive Status: History of cognitive impairments - at baseline                      Exercises      General Comments General comments (skin integrity, edema, etc.): Spent extensive time talking to pt's sons about my findings and helping the son's understand some of the process of helping their parents with potential for change of living environments      Pertinent Vitals/Pain Pain Assessment: No/denies pain    Home Living                      Prior Function            PT Goals (current goals can now be found in the care plan section) Acute Rehab PT Goals Patient Stated Goal: return home PT Goal Formulation: With patient Time For Goal Achievement: 08/03/16 Potential to Achieve Goals: Good Progress towards PT goals: Progressing toward goals    Frequency    Min 3X/week  PT Plan Current plan remains appropriate    Co-evaluation             End of Session   Activity Tolerance: Patient tolerated treatment well Patient left: in bed;with family/visitor present;with call bell/phone within reach;with bed alarm set Nurse Communication: Mobility status PT Visit Diagnosis: Unsteadiness on feet (R26.81);Muscle weakness (generalized) (M62.81)     Time: 6742-5525 PT Time Calculation (min) (ACUTE ONLY): 45 min  Charges:  $Gait Training: 23-37 mins $Therapeutic Activity: 8-22 mins                     G CodesTessie Fass Brandyn Lowrey 07/28/2016, 3:44 PM 07/28/2016  Donnella Sham, [place] [phone]  (pager)

## 2016-07-28 NOTE — Progress Notes (Signed)
Inpatient Diabetes Program Recommendations  AACE/ADA: New Consensus Statement on Inpatient Glycemic Control (2015)  Target Ranges:  Prepandial:   less than 140 mg/dL      Peak postprandial:   less than 180 mg/dL (1-2 hours)      Critically ill patients:  140 - 180 mg/dL   Lab Results  Component Value Date   GLUCAP 307 (H) 07/28/2016   HGBA1C 8.6 (H) 09/15/2015    Review of Glycemic Control Results for [name] Powell, [name] Powell (MRN [id number]) as of 07/28/2016 10:48  Ref. Range 07/27/2016 07:33 07/27/2016 11:21 07/27/2016 16:27 07/27/2016 21:43 07/28/2016 07:31  Glucose-Capillary Latest Ref Range: 65 - 99 mg/dL 135 (H) 220 (H) 141 (H) 261 (H) 307 (H)   Diabetes history: DM2 Outpatient Diabetes medications: Amaryl 4 mg qd + Metformin 50 mg qd + Tradjenta 50 q pm Current orders for Inpatient glycemic control: Tradjenta 5 mg qd + Novolog correction 0-9 units tid + 0-5 units hs  Inpatient Diabetes Program Recommendations:  While oral meds held and on steroids, please consider: -Lantus 17 units daily ( 0.2 units/kg x 86.1 kg)  Thank you, Bethena Roys E. Mamye Bolds, RN, MSN, CDE Inpatient Glycemic Control Team Team Pager [phone] (8am-5pm) 07/28/2016 10:56 AM

## 2016-07-29 DIAGNOSIS — E1142 Type 2 diabetes mellitus with diabetic polyneuropathy: Secondary | ICD-10-CM

## 2016-07-29 LAB — CUP PACEART INCLINIC DEVICE CHECK
Battery Remaining Longevity: 20 mo
Brady Statistic AP VP Percent: 42 %
Brady Statistic AP VS Percent: 0 %
Brady Statistic AS VS Percent: 5 %
Implantable Lead Implant Date: 20080804
Implantable Lead Location: 753860
Implantable Lead Model: 5076
Implantable Pulse Generator Implant Date: 20080804
Lead Channel Impedance Value: 440 Ohm
Lead Channel Impedance Value: 442 Ohm
Lead Channel Pacing Threshold Amplitude: 0.625 V
Lead Channel Pacing Threshold Amplitude: 0.875 V
Lead Channel Setting Pacing Amplitude: 2.5 V
Lead Channel Setting Sensing Sensitivity: 2.8 mV
MDC IDC LEAD IMPLANT DT: 20080804
MDC IDC LEAD LOCATION: 753859
MDC IDC MSMT BATTERY IMPEDANCE: 2116 Ohm
MDC IDC MSMT BATTERY VOLTAGE: 2.71 V
MDC IDC MSMT LEADCHNL RA PACING THRESHOLD PULSEWIDTH: 0.4 ms
MDC IDC MSMT LEADCHNL RV PACING THRESHOLD PULSEWIDTH: 0.4 ms
MDC IDC SESS DTM: 20180301135826
MDC IDC SET LEADCHNL RA PACING AMPLITUDE: 1.875
MDC IDC SET LEADCHNL RV PACING PULSEWIDTH: 0.4 ms
MDC IDC STAT BRADY AS VP PERCENT: 53 %

## 2016-07-29 LAB — BASIC METABOLIC PANEL
Anion gap: 12 (ref 5–15)
BUN: 38 mg/dL — ABNORMAL HIGH (ref 6–20)
CO2: 28 mmol/L (ref 22–32)
Calcium: 7.8 mg/dL — ABNORMAL LOW (ref 8.9–10.3)
Chloride: 98 mmol/L — ABNORMAL LOW (ref 101–111)
Creatinine, Ser: 1.51 mg/dL — ABNORMAL HIGH (ref 0.61–1.24)
GFR calc Af Amer: 44 mL/min — ABNORMAL LOW (ref >60)
GFR, EST NON AFRICAN AMERICAN: 38 mL/min — AB (ref >60)
Glucose, Bld: 283 mg/dL — ABNORMAL HIGH (ref 65–99)
POTASSIUM: 4.1 mmol/L (ref 3.5–5.1)
SODIUM: 138 mmol/L (ref 135–145)

## 2016-07-29 LAB — CBC
HCT: 30.9 % — ABNORMAL LOW (ref 39.0–52.0)
Hemoglobin: 9.8 g/dL — ABNORMAL LOW (ref 13.0–17.0)
MCH: 31 pg (ref 26.0–34.0)
MCHC: 31.7 g/dL (ref 30.0–36.0)
MCV: 97.8 fL (ref 78.0–100.0)
PLATELETS: 158 10*3/uL (ref 150–400)
RBC: 3.16 MIL/uL — AB (ref 4.22–5.81)
RDW: 15.8 % — ABNORMAL HIGH (ref 11.5–15.5)
WBC: 12.8 10*3/uL — AB (ref 4.0–10.5)

## 2016-07-29 LAB — GLUCOSE, CAPILLARY
GLUCOSE-CAPILLARY: 257 mg/dL — AB (ref 65–99)
Glucose-Capillary: 353 mg/dL — ABNORMAL HIGH (ref 65–99)

## 2016-07-29 MED ORDER — PREDNISONE 20 MG PO TABS
40.0000 mg | ORAL_TABLET | Freq: Every day | ORAL | Status: DC
  Administered 2016-07-29: 40 mg via ORAL
  Filled 2016-07-29: qty 2

## 2016-07-29 MED ORDER — GUAIFENESIN ER 600 MG PO TB12: 600.0000 mg | ORAL_TABLET | Freq: Two times a day (BID) | ORAL | 0 refills | Status: DC

## 2016-07-29 MED ORDER — ALBUTEROL SULFATE HFA 108 (90 BASE) MCG/ACT IN AERS: 2.0000 | INHALATION_SPRAY | Freq: Four times a day (QID) | RESPIRATORY_TRACT | 2 refills | Status: DC | PRN

## 2016-07-29 MED ORDER — POTASSIUM CHLORIDE CRYS ER 10 MEQ PO TBCR: 10.0000 meq | EXTENDED_RELEASE_TABLET | Freq: Every day | ORAL | 0 refills | Status: DC

## 2016-07-29 MED ORDER — DOXYCYCLINE MONOHYDRATE 100 MG PO CAPS: 100.0000 mg | ORAL_CAPSULE | Freq: Two times a day (BID) | ORAL | 0 refills | Status: DC

## 2016-07-29 MED ORDER — DOXYCYCLINE HYCLATE 100 MG PO TABS
100.0000 mg | ORAL_TABLET | Freq: Two times a day (BID) | ORAL | Status: DC
  Administered 2016-07-29: 100 mg via ORAL
  Filled 2016-07-29: qty 1

## 2016-07-29 MED ORDER — FUROSEMIDE 40 MG PO TABS: 40.0000 mg | ORAL_TABLET | Freq: Every day | ORAL | 0 refills | Status: DC

## 2016-07-29 MED ORDER — PREDNISONE 10 MG PO TABS: ORAL_TABLET | ORAL | 0 refills | Status: DC

## 2016-07-29 MED ORDER — PREDNISONE 50 MG PO TABS
50.0000 mg | ORAL_TABLET | Freq: Every day | ORAL | Status: DC
  Filled 2016-07-29: qty 1

## 2016-07-29 NOTE — Progress Notes (Signed)
Pt continues with low rectal temps. Temp = 96.8 this AM. Extra blankets placed on patient after AM bath. Will continue to monitor.

## 2016-07-29 NOTE — Progress Notes (Signed)
Rollater ( rolling walker with seat) ordered as requested and to be delivered to the room today prior to discharging home; Brad with [facility] called and updated. Mindi [facility] [phone]

## 2016-07-29 NOTE — Progress Notes (Signed)
Pt has orders to be discharged. Discharge instructions given and pt has no additional questions at this time. Medication regimen reviewed and pt educated. Pt verbalized understanding and has no additional questions. Telemetry box removed. IV removed and site in good condition. Pt stable and waiting for transportation.  Lorianne Malbrough RN 

## 2016-07-29 NOTE — Discharge Summary (Signed)
Physician Discharge Summary  [name] Powell MWN:[id number] DOB: [birth year] DOA: 07/26/2016  PCP: [name], MD  Admit date: 07/26/2016 Discharge date: 07/29/2016   Recommendations for Outpatient Follow-Up:   Home health  Discharge Diagnosis:   Principal Problem:   Weakness generalized Active Problems:   CAD s/p CABG 2003   CHB (complete heart block) (HCC)   HTN (hypertension)   Chronic diastolic congestive heart failure ([place])   CAP (community acquired pneumonia)   Acute on chronic diastolic congestive heart failure (HCC)   Atrial fibrillation (HCC)   CHF (congestive heart failure) (HCC)   Generalized weakness   Discharge disposition:  Home.  Discharge Condition: Improved.  Diet recommendation: Low sodium, heart healthy.  Carbohydrate-modified  Wound care: None.   History of Present Illness:   [name] Powell is a 83 y.o. male with CAD s/p CABG, sinus arrest and complete heart block (pacemaker dependent), Current episodes of paroxysmal atrial fibrillation and atrial flutter, chronic diastolic heart failure and recently diagnosed Parkinson's disease, diabetes mellitus type 2, chronic anemia was brought to the ER the patient's family found the patient was getting increasingly weak with some cough. Family also felt that patient may have been more weak on the right side and had gone to the PCP and was referred to the ER.   ED Course: In the ER patient was found to be nonfocal. Chest x-ray showed congestion BNP is more than 500 creatinine is elevated from baseline. Patient denies any chest pain shortness of breath. CT head is negative for anything acute. Patient has pacemaker and cannot have MRI brain done. Patient was given Lasix 40 mg IV in the ER and admitted for CHF and weakness.   Hospital Course by Problem:   CAP -rocephin/azithromycin- change to PO abx -nebs -mucinex -quick titration of steroids  Generalized weakness -prob from infection +  age -PT eval- home health  Acute on chronic diastolic CHFlast EF measured in May 2017 was 60-65 present with grade 1 diastolic dysfunction - resume home lasix  Paroxysmal atrial fibrillation with history of pacemaker placement for complete heart block  -Apixaban which will be continued. Continue beta blockers -consider reduction of eliquis if Cr continues to be elevated  Chronic anemia  -follow CBC as outpatient.  Chronic kidney disease stage III -similar to outpatient Cr  Diabetes mellitus2 -  -resume home meds  Parkinson's disease continue Sinemet -PT eval -per family no issues with swallowing    Medical Consultants:    None.   Discharge Exam:   Vitals:   07/29/16 0517 07/29/16 1236  BP: (!) 161/80 138/62  Pulse: 60 60  Resp: 18 16  Temp: (!) 96.8 F (36 C) 97.4 F (36.3 C)   Vitals:   07/28/16 2120 07/28/16 2121 07/29/16 0517 07/29/16 1236  BP: (!) 158/74  (!) 161/80 138/62  Pulse: 60  60 60  Resp: 19  18 16   Temp: 97.5 F (36.4 C) 97.2 F (36.2 C) (!) 96.8 F (36 C) 97.4 F (36.3 C)  TempSrc: Oral Rectal Rectal Oral  SpO2: 95%  98% 96%  Weight:   83 kg (182 lb 14.4 oz)   Height:        Gen:  NAD    The results of significant diagnostics from this hospitalization (including imaging, microbiology, ancillary and laboratory) are listed below for reference.     Procedures and Diagnostic Studies:   Dg Chest 2 View  Result Date: 07/26/2016 CLINICAL DATA:  Hypertension.  CABG. EXAM: CHEST  2 VIEW COMPARISON:  09/18/2015 . FINDINGS: Cardiac pacer noted in stable position. Stable cardiomegaly. Diffuse mild bilateral interstitial prominence noted. Small left pleural effusion. Mild CHF cannot be completely excluded. IMPRESSION: 1.  Cardiac pacer noted in stable position.  Prior CABG. 2. Cardiomegaly with pulmonary vascular prominence and very mild interstitial prominence. Small left pleural effusion cannot be excluded. Findings consist with mild CHF  . Electronically Signed   By: Marcello Moores  Register   On: 07/26/2016 17:21   Ct Head Wo Contrast  Result Date: 07/26/2016 CLINICAL DATA:  Right leg weakness EXAM: CT HEAD WITHOUT CONTRAST TECHNIQUE: Contiguous axial images were obtained from the base of the skull through the vertex without intravenous contrast. COMPARISON:  08/25/2015 FINDINGS: Brain: Diffuse atrophic changes and chronic white matter ischemic changes are seen. No findings to suggest acute hemorrhage, acute infarction or space-occupying mass lesion are noted. Vascular: No hyperdense vessel or unexpected calcification. Skull: Normal. Negative for fracture or focal lesion. Sinuses/Orbits: No acute finding. Other: None. IMPRESSION: Chronic atrophic and ischemic changes without acute abnormality. Electronically Signed   By: Inez Catalina M.D.   On: 07/26/2016 21:22   Dg Chest Port 1 View  Result Date: 07/27/2016 CLINICAL DATA:  Shortness of breath, hypertension, diabetes, known coronary disease EXAM: PORTABLE CHEST 1 VIEW COMPARISON:  07/26/2016 FINDINGS: Marked cardiomegaly, similar to the prior study. Previous coronary bypass changes and left subclavian pacer. Increased left lower lobe retrocardiac opacity obscuring the left hemidiaphragm suspicious for left basilar consolidation or pneumonia. Right lung is clear. Exam is rotated to the left. Atherosclerosis of the aorta. IMPRESSION: Increased left lower lobe retrocardiac opacity concerning for consolidation or pneumonia. Cardiomegaly without CHF Thoracic aortic atherosclerosis Electronically Signed   By: Jerilynn Mages.  Shick M.D.   On: 07/27/2016 21:05     Labs:   Basic Metabolic Panel:  Recent Labs Lab 07/26/16 1735 07/27/16 0449 07/28/16 0509 07/29/16 0432  NA 139 139 136 138  K 4.0 3.5 3.9 4.1  CL 103 103 99* 98*  CO2 25 26 24 28   GLUCOSE 111* 165* 299* 283*  BUN 32* 32* 33* 38*  CREATININE 1.59* 1.62* 1.61* 1.51*  CALCIUM 8.4* 7.8* 7.7* 7.8*   GFR Estimated Creatinine Clearance:  31.2 mL/min (A) (by C-G formula based on SCr of 1.51 mg/dL (H)). Liver Function Tests:  Recent Labs Lab 07/26/16 1735 07/27/16 0449  AST 40 30  ALT 15* 27  ALKPHOS 116 91  BILITOT 0.6 0.5  PROT 7.6 6.1*  ALBUMIN 3.7 3.0*    Recent Labs Lab 07/26/16 1735  LIPASE 34   No results for input(s): AMMONIA in the last 168 hours. Coagulation profile  Recent Labs Lab 07/26/16 2048  INR 1.34    CBC:  Recent Labs Lab 07/26/16 1735 07/27/16 0449 07/28/16 0509 07/29/16 0432  WBC 6.1 5.2 8.3 12.8*  NEUTROABS  --  2.5  --   --   HGB 11.2* 9.8* 10.3* 9.8*  HCT 35.8* 31.2* 32.8* 30.9*  MCV 99.4 99.0 98.8 97.8  PLT 160 146* 150 158   Cardiac Enzymes:  Recent Labs Lab 07/27/16 0449 07/27/16 0949 07/27/16 1604  TROPONINI 0.06* 0.06* 0.07*   BNP: Invalid input(s): POCBNP CBG:  Recent Labs Lab 07/28/16 0731 07/28/16 1221 07/28/16 1620 07/28/16 2120 07/29/16 0830  GLUCAP 307* 346* 389* 398* 257*   D-Dimer No results for input(s): DDIMER in the last 72 hours. Hgb A1c No results for input(s): HGBA1C in the last 72 hours. Lipid Profile No results for input(s): CHOL, HDL,  LDLCALC, TRIG, CHOLHDL, LDLDIRECT in the last 72 hours. Thyroid function studies  Recent Labs  07/26/16 2048  TSH 1.957   Anemia work up No results for input(s): VITAMINB12, FOLATE, FERRITIN, TIBC, IRON, RETICCTPCT in the last 72 hours. Microbiology Recent Results (from the past 240 hour(s))  Urine culture     Status: None   Collection Time: 07/26/16 11:30 PM  Result Value Ref Range Status   Specimen Description URINE, RANDOM  Final   Special Requests NONE  Final   Culture NO GROWTH  Final   Report Status 07/28/2016 FINAL  Final  Culture, blood (routine x 2)     Status: None (Preliminary result)   Collection Time: 07/27/16 10:41 PM  Result Value Ref Range Status   Specimen Description BLOOD BLOOD LEFT FOREARM  Final   Special Requests BOTTLES DRAWN AEROBIC ONLY [place]  Final   Culture  NO GROWTH 1 DAY  Final   Report Status PENDING  Incomplete  Culture, blood (routine x 2)     Status: None (Preliminary result)   Collection Time: 07/27/16 11:32 PM  Result Value Ref Range Status   Specimen Description BLOOD RIGHT ANTECUBITAL  Final   Special Requests   Final    BOTTLES DRAWN AEROBIC AND ANAEROBIC 10CC BLUE [place] RED   Culture NO GROWTH 1 DAY  Final   Report Status PENDING  Incomplete  Culture, expectorated sputum-assessment     Status: None   Collection Time: 07/28/16 11:21 AM  Result Value Ref Range Status   Specimen Description EXPECTORATED SPUTUM  Final   Special Requests Normal  Final   Sputum evaluation   Final    Sputum specimen not acceptable for testing.  Please recollect.   RESULT CALLED TO, READ BACK BY AND VERIFIED WITH: S LEWIS 07/28/16 @ 31 M VESTAL    Report Status 07/28/2016 FINAL  Final     Discharge Instructions:   Discharge Instructions    Diet - low sodium heart healthy    Complete by:  As directed    Diet Carb Modified    Complete by:  As directed    Discharge instructions    Complete by:  As directed    Outpatient cbc, bmp 1 week Home health   Increase activity slowly    Complete by:  As directed      Allergies as of 07/29/2016      Reactions   Lanoxin [digoxin] Rash   Eyelid rash   Mexitil [mexiletine] Other (See Comments)   unknown      Medication List    STOP taking these medications   potassium chloride 10 MEQ tablet Commonly known as:  K-DUR Replaced by:  potassium chloride 10 MEQ tablet     TAKE these medications   albuterol 108 (90 Base) MCG/ACT inhaler Commonly known as:  PROVENTIL HFA;VENTOLIN HFA Inhale 2 puffs into the lungs every 6 (six) hours as needed for wheezing or shortness of breath.   atorvastatin 20 MG tablet Commonly known as:  LIPITOR Take 20 mg by mouth daily.   benzonatate 100 MG capsule Commonly known as:  TESSALON Take 100 mg by mouth 3 (three) times daily as needed for cough.    carbidopa-levodopa 25-100 MG tablet Commonly known as:  SINEMET IR Take 1 tablet by mouth 3 (three) times daily.   doxycycline 100 MG capsule Commonly known as:  MONODOX Take 1 capsule (100 mg total) by mouth 2 (two) times daily.   ELIQUIS 5 MG Tabs tablet Generic  drug:  apixaban TAKE 1 TABLET TWICE DAILY   furosemide 40 MG tablet Commonly known as:  LASIX Take 1 tablet (40 mg total) by mouth daily. Start taking on:  07/30/2016 What changed:  See the new instructions.   glimepiride 4 MG tablet Commonly known as:  AMARYL Take 4 mg by mouth daily before breakfast.   guaiFENesin 600 MG 12 hr tablet Commonly known as:  MUCINEX Take 1 tablet (600 mg total) by mouth 2 (two) times daily.   JANUVIA 50 MG tablet Generic drug:  sitaGLIPtin Take 1 tablet by mouth every evening.   metFORMIN 500 MG 24 hr tablet Commonly known as:  GLUCOPHAGE-XR Take 500 mg by mouth daily with breakfast.   metoprolol 50 MG tablet Commonly known as:  LOPRESSOR TAKE 2 TABLETS (100MG ) BY MOUTH IN THE MORNING AND 1 TABLET (50MG ) IN THE EVENING.   mometasone 0.1 % cream Commonly known as:  ELOCON APPLY TO LEGS AS NEEDED FOR FOOT DERMATITIS   multivitamin with minerals Tabs tablet Take 1 tablet by mouth daily.   polyethylene glycol packet Commonly known as:  MIRALAX / GLYCOLAX Take 17 g by mouth daily. To prevent constipation   potassium chloride 10 MEQ tablet Commonly known as:  K-DUR,KLOR-CON Take 1 tablet (10 mEq total) by mouth daily. Start taking on:  07/30/2016 Replaces:  potassium chloride 10 MEQ tablet   predniSONE 10 MG tablet Commonly known as:  DELTASONE 40 mg x 1 days, 30 mg x 1 days, 20 mg x 1 day, 10 mg x 1 day then stop   PRESERVISION AREDS Caps Take 1 capsule by mouth 2 (two) times daily.   valsartan 160 MG tablet Commonly known as:  DIOVAN TAKE 1 TABLET EVERY DAY   VITAMIN D PO Take 10,000 Units by mouth every Friday. On Friday            Durable Medical  Equipment        Start     Ordered   07/29/16 1038  For home use only DME Walker  Once    Comments:  [place]  Question:  Patient needs a walker to treat with the following condition  Answer:  CHF (congestive heart failure) ([place])   07/29/16 1038     Follow-up Information    [facility] Follow up.   Specialty:  [place] Why:  they will do your home health care at your home Contact information: [address] [address] [phone]        [name], MD Follow up on 08/09/2016.   Specialty:  Family Medicine Why:  @9 :[place] information: [place] [address] [phone]            Time coordinating discharge: 35 min  Signed:  JESSICA Alison Stalling   [facility] 07/29/2016, 12:46 PM

## 2016-08-02 ENCOUNTER — Other Ambulatory Visit: Payer: Self-pay

## 2016-08-02 LAB — CULTURE, BLOOD (ROUTINE X 2)
Culture: NO GROWTH
Culture: NO GROWTH

## 2016-08-02 NOTE — Patient Outreach (Addendum)
[facility]) Care Management  08/02/2016  [name] Powell September 06, 1922 [id number]      EMMI-HF RED ON EMMI ALERT Day # 3 Date: 08/01/16 Red Alert Reason: " Weighed themselves today? No"    Outreach attempt #1 to patient. Spoke with spouse(ROI on file). Reviewed and addressed red alert. Spouse states that patient has bene weighing. She is keeping track of weight. She states patient's weight is down. Weight 178.8lbs. Spouse report that patient had a lot of swelling prior to hospital stay. However, since return home she has noticed that swelling has decreased and is improved. Patient has visit scheduled today with [facility] services for eval. Spouse reports that she has called PCP office as patient has been coughing up phlegm and she wanted to get him checked out and to be sen sooner. His PCP f./u appt was originally scheduled for 08/09/16. Spouse is managing patient's meds and fills med planner weekly. Denies any issues with meds. Spouse reports that patient currently having no other symptoms except for coughing of phlegm. She denies any further RN CM needs or concerns at this time. RN CM provided education to spouse of s/s of worsening condition. Advised spouse that patient will continue to get automated EMMI HF calls and will receive a call from a nurse if any of their responses are abnormal.      Plan: RN CM will notify [facility] administrative assistant of case status.    Enzo Montgomery, RN,BSN,CCM [place] Management Telephonic Care Management Coordinator Direct Phone: [phone] Toll Free: [phone] Fax: [phone]

## 2016-08-03 DIAGNOSIS — I10 Essential (primary) hypertension: Secondary | ICD-10-CM | POA: Diagnosis not present

## 2016-08-03 DIAGNOSIS — I442 Atrioventricular block, complete: Secondary | ICD-10-CM | POA: Diagnosis not present

## 2016-08-03 DIAGNOSIS — I509 Heart failure, unspecified: Secondary | ICD-10-CM | POA: Diagnosis not present

## 2016-08-03 DIAGNOSIS — J189 Pneumonia, unspecified organism: Secondary | ICD-10-CM | POA: Diagnosis not present

## 2016-08-03 DIAGNOSIS — G20 Parkinson's disease: Secondary | ICD-10-CM | POA: Diagnosis not present

## 2016-08-03 DIAGNOSIS — M6281 Muscle weakness (generalized): Secondary | ICD-10-CM | POA: Diagnosis not present

## 2016-08-03 DIAGNOSIS — E119 Type 2 diabetes mellitus without complications: Secondary | ICD-10-CM | POA: Diagnosis not present

## 2016-08-03 DIAGNOSIS — Z950 Presence of cardiac pacemaker: Secondary | ICD-10-CM | POA: Diagnosis not present

## 2016-08-03 DIAGNOSIS — I4891 Unspecified atrial fibrillation: Secondary | ICD-10-CM | POA: Diagnosis not present

## 2016-08-05 ENCOUNTER — Other Ambulatory Visit: Payer: Self-pay

## 2016-08-05 NOTE — Patient Outreach (Signed)
[place] [facility]) Care Management  08/05/2016  [name] Powell 04-09-1923 [id number]     EMMI-HF RED ON EMMI ALERT Day # 6 Date: 08/04/16 Red Alert Reason: "Weighed themselves today? No"    Outreach attempt #1 to patient. Spoke with spouse(ROI on file). Reviewed and addressed red alert with spouse. Spouse states that automated call came prior to patient having a chance to get up and weigh. Spouse continues to keep a log of patient's weight. His weight remains stable. Weight on yesterday was 176 lbs. Weight today was 175 lbs. Spouse voices that this is the first time in a long time that she has been able to see patient's ankles and normal size of legs due to decrease in swelling. She reports that patient is doing and feeling better overall as well. She states she did speak with Md regarding his cough and patient has been taking cough syrup which has been helping and improving symptoms. Spouse denies any RN CM needs or concerns. Continued education and support to spouse on mgmt of HF and when to seek medical attention. Spouse able to verbalize understanding. Advised spouse that patient will continue to get automated EMMI HF calls and will receive a call from a nurse if any of their responses are abnormal. Spouse voiced understanding and appreciative of f/u call.      Plan:  RN CM will notify [facility] administrative assistant of case status.   Enzo Montgomery, RN,BSN,CCM [place] Management Telephonic Care Management Coordinator Direct Phone: [phone] Toll Free: [phone] Fax: [phone]

## 2016-08-06 DIAGNOSIS — I442 Atrioventricular block, complete: Secondary | ICD-10-CM | POA: Diagnosis not present

## 2016-08-06 DIAGNOSIS — E119 Type 2 diabetes mellitus without complications: Secondary | ICD-10-CM | POA: Diagnosis not present

## 2016-08-06 DIAGNOSIS — G20 Parkinson's disease: Secondary | ICD-10-CM | POA: Diagnosis not present

## 2016-08-06 DIAGNOSIS — I4891 Unspecified atrial fibrillation: Secondary | ICD-10-CM | POA: Diagnosis not present

## 2016-08-06 DIAGNOSIS — I509 Heart failure, unspecified: Secondary | ICD-10-CM | POA: Diagnosis not present

## 2016-08-06 DIAGNOSIS — J189 Pneumonia, unspecified organism: Secondary | ICD-10-CM | POA: Diagnosis not present

## 2016-08-06 DIAGNOSIS — Z950 Presence of cardiac pacemaker: Secondary | ICD-10-CM | POA: Diagnosis not present

## 2016-08-06 DIAGNOSIS — M6281 Muscle weakness (generalized): Secondary | ICD-10-CM | POA: Diagnosis not present

## 2016-08-06 DIAGNOSIS — I10 Essential (primary) hypertension: Secondary | ICD-10-CM | POA: Diagnosis not present

## 2016-08-09 ENCOUNTER — Other Ambulatory Visit: Payer: Self-pay

## 2016-08-09 DIAGNOSIS — J189 Pneumonia, unspecified organism: Secondary | ICD-10-CM | POA: Diagnosis not present

## 2016-08-09 DIAGNOSIS — D649 Anemia, unspecified: Secondary | ICD-10-CM | POA: Diagnosis not present

## 2016-08-09 DIAGNOSIS — N183 Chronic kidney disease, stage 3 (moderate): Secondary | ICD-10-CM | POA: Diagnosis not present

## 2016-08-09 NOTE — Patient Outreach (Signed)
[facility]) Care Management  08/09/2016  YADRIEL KERRIGAN [birth year] [id number]     EMMI-HF RED ON EMMI ALERT Day # 8 Date: 08/06/16 Red Alert Reason: "Went to follow up appt? No"   No outreach attempt warranted at this time. Issues was addressed and discussed on previous calls with spouse. Patient has PCP f/u appt today(08/09/16).    Plan: RN CM will notify [facility] administrative assistant of case status.  Enzo Montgomery, RN,BSN,CCM [place] Management Telephonic Care Management Coordinator Direct Phone: [phone] Toll Free: [phone] Fax: [phone]

## 2016-08-10 ENCOUNTER — Telehealth: Payer: Self-pay | Admitting: Cardiovascular Disease

## 2016-08-10 DIAGNOSIS — I4891 Unspecified atrial fibrillation: Secondary | ICD-10-CM | POA: Diagnosis not present

## 2016-08-10 DIAGNOSIS — I442 Atrioventricular block, complete: Secondary | ICD-10-CM | POA: Diagnosis not present

## 2016-08-10 DIAGNOSIS — I509 Heart failure, unspecified: Secondary | ICD-10-CM | POA: Diagnosis not present

## 2016-08-10 DIAGNOSIS — Z950 Presence of cardiac pacemaker: Secondary | ICD-10-CM | POA: Diagnosis not present

## 2016-08-10 DIAGNOSIS — I10 Essential (primary) hypertension: Secondary | ICD-10-CM | POA: Diagnosis not present

## 2016-08-10 DIAGNOSIS — E119 Type 2 diabetes mellitus without complications: Secondary | ICD-10-CM | POA: Diagnosis not present

## 2016-08-10 DIAGNOSIS — M6281 Muscle weakness (generalized): Secondary | ICD-10-CM | POA: Diagnosis not present

## 2016-08-10 DIAGNOSIS — J189 Pneumonia, unspecified organism: Secondary | ICD-10-CM | POA: Diagnosis not present

## 2016-08-10 DIAGNOSIS — G20 Parkinson's disease: Secondary | ICD-10-CM | POA: Diagnosis not present

## 2016-08-10 NOTE — Telephone Encounter (Signed)
New Message  Pt wife call requesting to speak with RN to see if Dr. Sallyanne Kuster received pt results of labs. Pt wife would ike to further discuss. Please call back to discuss

## 2016-08-10 NOTE — Telephone Encounter (Signed)
Spoke with pt's wife she states that pt's PCP did some labs and has faxed it over the results and wife states that kidney function is the same and anemia is much better. When pt was d/c from the hospital they increased his lasix to 40 mg daily and before it was only 3 days q week. Ok to continue the daily dosage? PCP told her to make sure to call and ask if that was ok. Please advise

## 2016-08-10 NOTE — Telephone Encounter (Signed)
OK to continue daily, but please call us back if he starts developing dizziness when he stands (orthostatic hypotension) which was a problem in the past and will worsen if he gets "too dry".

## 2016-08-11 DIAGNOSIS — I442 Atrioventricular block, complete: Secondary | ICD-10-CM | POA: Diagnosis not present

## 2016-08-11 DIAGNOSIS — E119 Type 2 diabetes mellitus without complications: Secondary | ICD-10-CM | POA: Diagnosis not present

## 2016-08-11 DIAGNOSIS — I4891 Unspecified atrial fibrillation: Secondary | ICD-10-CM | POA: Diagnosis not present

## 2016-08-11 DIAGNOSIS — I10 Essential (primary) hypertension: Secondary | ICD-10-CM | POA: Diagnosis not present

## 2016-08-11 DIAGNOSIS — I509 Heart failure, unspecified: Secondary | ICD-10-CM | POA: Diagnosis not present

## 2016-08-11 DIAGNOSIS — J189 Pneumonia, unspecified organism: Secondary | ICD-10-CM | POA: Diagnosis not present

## 2016-08-11 DIAGNOSIS — G20 Parkinson's disease: Secondary | ICD-10-CM | POA: Diagnosis not present

## 2016-08-11 DIAGNOSIS — M6281 Muscle weakness (generalized): Secondary | ICD-10-CM | POA: Diagnosis not present

## 2016-08-11 DIAGNOSIS — Z950 Presence of cardiac pacemaker: Secondary | ICD-10-CM | POA: Diagnosis not present

## 2016-08-11 NOTE — Telephone Encounter (Signed)
Wife returned call. Notified her of recommendations. Wife verbalized understanding and agreed with plan.

## 2016-08-11 NOTE — Telephone Encounter (Signed)
lmtcb

## 2016-08-12 DIAGNOSIS — E119 Type 2 diabetes mellitus without complications: Secondary | ICD-10-CM | POA: Diagnosis not present

## 2016-08-12 DIAGNOSIS — I442 Atrioventricular block, complete: Secondary | ICD-10-CM | POA: Diagnosis not present

## 2016-08-12 DIAGNOSIS — I509 Heart failure, unspecified: Secondary | ICD-10-CM | POA: Diagnosis not present

## 2016-08-12 DIAGNOSIS — Z950 Presence of cardiac pacemaker: Secondary | ICD-10-CM | POA: Diagnosis not present

## 2016-08-12 DIAGNOSIS — G20 Parkinson's disease: Secondary | ICD-10-CM | POA: Diagnosis not present

## 2016-08-12 DIAGNOSIS — I10 Essential (primary) hypertension: Secondary | ICD-10-CM | POA: Diagnosis not present

## 2016-08-12 DIAGNOSIS — M6281 Muscle weakness (generalized): Secondary | ICD-10-CM | POA: Diagnosis not present

## 2016-08-12 DIAGNOSIS — J189 Pneumonia, unspecified organism: Secondary | ICD-10-CM | POA: Diagnosis not present

## 2016-08-12 DIAGNOSIS — I4891 Unspecified atrial fibrillation: Secondary | ICD-10-CM | POA: Diagnosis not present

## 2016-08-13 DIAGNOSIS — M6281 Muscle weakness (generalized): Secondary | ICD-10-CM | POA: Diagnosis not present

## 2016-08-13 DIAGNOSIS — G20 Parkinson's disease: Secondary | ICD-10-CM | POA: Diagnosis not present

## 2016-08-13 DIAGNOSIS — J189 Pneumonia, unspecified organism: Secondary | ICD-10-CM | POA: Diagnosis not present

## 2016-08-13 DIAGNOSIS — I4891 Unspecified atrial fibrillation: Secondary | ICD-10-CM | POA: Diagnosis not present

## 2016-08-13 DIAGNOSIS — I10 Essential (primary) hypertension: Secondary | ICD-10-CM | POA: Diagnosis not present

## 2016-08-13 DIAGNOSIS — I509 Heart failure, unspecified: Secondary | ICD-10-CM | POA: Diagnosis not present

## 2016-08-13 DIAGNOSIS — Z950 Presence of cardiac pacemaker: Secondary | ICD-10-CM | POA: Diagnosis not present

## 2016-08-13 DIAGNOSIS — E119 Type 2 diabetes mellitus without complications: Secondary | ICD-10-CM | POA: Diagnosis not present

## 2016-08-13 DIAGNOSIS — I442 Atrioventricular block, complete: Secondary | ICD-10-CM | POA: Diagnosis not present

## 2016-08-16 ENCOUNTER — Other Ambulatory Visit: Payer: Self-pay

## 2016-08-16 DIAGNOSIS — I509 Heart failure, unspecified: Secondary | ICD-10-CM | POA: Diagnosis not present

## 2016-08-16 DIAGNOSIS — M6281 Muscle weakness (generalized): Secondary | ICD-10-CM | POA: Diagnosis not present

## 2016-08-16 DIAGNOSIS — I442 Atrioventricular block, complete: Secondary | ICD-10-CM | POA: Diagnosis not present

## 2016-08-16 DIAGNOSIS — E119 Type 2 diabetes mellitus without complications: Secondary | ICD-10-CM | POA: Diagnosis not present

## 2016-08-16 DIAGNOSIS — J189 Pneumonia, unspecified organism: Secondary | ICD-10-CM | POA: Diagnosis not present

## 2016-08-16 DIAGNOSIS — I10 Essential (primary) hypertension: Secondary | ICD-10-CM | POA: Diagnosis not present

## 2016-08-16 DIAGNOSIS — Z950 Presence of cardiac pacemaker: Secondary | ICD-10-CM | POA: Diagnosis not present

## 2016-08-16 DIAGNOSIS — I4891 Unspecified atrial fibrillation: Secondary | ICD-10-CM | POA: Diagnosis not present

## 2016-08-16 DIAGNOSIS — G20 Parkinson's disease: Secondary | ICD-10-CM | POA: Diagnosis not present

## 2016-08-16 NOTE — Patient Outreach (Signed)
[place] [facility]) Care Management  08/16/2016  [name] Powell Sep 24, 1922 [id number]       EMMI- HF RED ON EMMI ALERT Day # 16 Date: 08/14/16 Red Alert Reason: " Questions about meds? Yes"    Outreach attempt #1 to patient. Spoke with spouse. Addressed and reviewed red alert. She states that issue with meds has been resolved. They had some concerns about rather or not to continue taking some of his meds. This was addressed and discussed with PCP during recent office visit and labs were drawn. Spouse states that PCP office send results to cardiologist office and their office did f/u with patient and advised them to continue taking meds as directed until next office visit. Spouse reports patient is doing well otherwise. Denies any new issues or concerns. Spouse happy to report that weight is remaining down. She reports that they were gone this morning when automated call cam through. Spouse states that weight this morning was 174.8 lbs and was 175 lbs the day before. No other RN CM needs or concerns at this time. Advised that they will continue to get automated calls and will receive a call from a nurse if any responses are abnormal. She voiced understanding and was appreciative of call.    Plan: RN CM will notify [facility] administrative assistant of case status.    Enzo Montgomery, RN,BSN,CCM [place] Management Telephonic Care Management Coordinator Direct Phone: [phone] Toll Free: [phone] Fax: [phone]

## 2016-08-17 DIAGNOSIS — I10 Essential (primary) hypertension: Secondary | ICD-10-CM | POA: Diagnosis not present

## 2016-08-17 DIAGNOSIS — G20 Parkinson's disease: Secondary | ICD-10-CM | POA: Diagnosis not present

## 2016-08-17 DIAGNOSIS — I4891 Unspecified atrial fibrillation: Secondary | ICD-10-CM | POA: Diagnosis not present

## 2016-08-17 DIAGNOSIS — I509 Heart failure, unspecified: Secondary | ICD-10-CM | POA: Diagnosis not present

## 2016-08-17 DIAGNOSIS — J189 Pneumonia, unspecified organism: Secondary | ICD-10-CM | POA: Diagnosis not present

## 2016-08-17 DIAGNOSIS — Z950 Presence of cardiac pacemaker: Secondary | ICD-10-CM | POA: Diagnosis not present

## 2016-08-17 DIAGNOSIS — I442 Atrioventricular block, complete: Secondary | ICD-10-CM | POA: Diagnosis not present

## 2016-08-17 DIAGNOSIS — M6281 Muscle weakness (generalized): Secondary | ICD-10-CM | POA: Diagnosis not present

## 2016-08-17 DIAGNOSIS — E119 Type 2 diabetes mellitus without complications: Secondary | ICD-10-CM | POA: Diagnosis not present

## 2016-08-18 ENCOUNTER — Other Ambulatory Visit: Payer: Self-pay

## 2016-08-18 DIAGNOSIS — E119 Type 2 diabetes mellitus without complications: Secondary | ICD-10-CM | POA: Diagnosis not present

## 2016-08-18 DIAGNOSIS — I10 Essential (primary) hypertension: Secondary | ICD-10-CM | POA: Diagnosis not present

## 2016-08-18 DIAGNOSIS — Z950 Presence of cardiac pacemaker: Secondary | ICD-10-CM | POA: Diagnosis not present

## 2016-08-18 DIAGNOSIS — M6281 Muscle weakness (generalized): Secondary | ICD-10-CM | POA: Diagnosis not present

## 2016-08-18 DIAGNOSIS — J189 Pneumonia, unspecified organism: Secondary | ICD-10-CM | POA: Diagnosis not present

## 2016-08-18 DIAGNOSIS — I442 Atrioventricular block, complete: Secondary | ICD-10-CM | POA: Diagnosis not present

## 2016-08-18 DIAGNOSIS — G20 Parkinson's disease: Secondary | ICD-10-CM | POA: Diagnosis not present

## 2016-08-18 DIAGNOSIS — I4891 Unspecified atrial fibrillation: Secondary | ICD-10-CM | POA: Diagnosis not present

## 2016-08-18 DIAGNOSIS — I509 Heart failure, unspecified: Secondary | ICD-10-CM | POA: Diagnosis not present

## 2016-08-18 NOTE — Patient Outreach (Signed)
[facility] [facility]) Care Management  08/18/2016  [name] Powell [birth year] [id number]     EMMI- HF RED ON EMMI ALERT Day # 19 Date: 08/17/16 Red Alert Reason:" Weight? 177 lbs"    Outreach attempt #1 to patient/spouse. Spoke with spouse. She states patient is doing well. HH PT currently in the home and working with the patient. Reviewed and addressed red alert. Spouse verified weight gain of almost two pounds . However, reports patient has less edema and denies nay SOB. Patient's weight continues to fluctuate between 175 lbs to 177 lbs. Spouse aware of s/s of worsening condition of worsening condition and when to seek medical attention. Advised spouse that they would continue to get automated EMMI- HF post discharge calls to assess how they are doing following recent hospitalization and will receive a call from a nurse if any of their responses were abnormal. Spouse voiced understanding and was appreciative of f/u call.    Plan: RN CM will notify [facility] administrative assistant of case status.   Enzo Montgomery, RN,BSN,CCM [place] Management Telephonic Care Management Coordinator Direct Phone: [phone] Toll Free: [phone] Fax: [phone]

## 2016-08-19 DIAGNOSIS — J189 Pneumonia, unspecified organism: Secondary | ICD-10-CM | POA: Diagnosis not present

## 2016-08-19 DIAGNOSIS — M6281 Muscle weakness (generalized): Secondary | ICD-10-CM | POA: Diagnosis not present

## 2016-08-19 DIAGNOSIS — Z950 Presence of cardiac pacemaker: Secondary | ICD-10-CM | POA: Diagnosis not present

## 2016-08-19 DIAGNOSIS — I442 Atrioventricular block, complete: Secondary | ICD-10-CM | POA: Diagnosis not present

## 2016-08-19 DIAGNOSIS — I10 Essential (primary) hypertension: Secondary | ICD-10-CM | POA: Diagnosis not present

## 2016-08-19 DIAGNOSIS — I4891 Unspecified atrial fibrillation: Secondary | ICD-10-CM | POA: Diagnosis not present

## 2016-08-19 DIAGNOSIS — I509 Heart failure, unspecified: Secondary | ICD-10-CM | POA: Diagnosis not present

## 2016-08-19 DIAGNOSIS — G20 Parkinson's disease: Secondary | ICD-10-CM | POA: Diagnosis not present

## 2016-08-19 DIAGNOSIS — E119 Type 2 diabetes mellitus without complications: Secondary | ICD-10-CM | POA: Diagnosis not present

## 2016-08-20 DIAGNOSIS — G20 Parkinson's disease: Secondary | ICD-10-CM | POA: Diagnosis not present

## 2016-08-20 DIAGNOSIS — I509 Heart failure, unspecified: Secondary | ICD-10-CM | POA: Diagnosis not present

## 2016-08-20 DIAGNOSIS — J189 Pneumonia, unspecified organism: Secondary | ICD-10-CM | POA: Diagnosis not present

## 2016-08-20 DIAGNOSIS — I4891 Unspecified atrial fibrillation: Secondary | ICD-10-CM | POA: Diagnosis not present

## 2016-08-20 DIAGNOSIS — I10 Essential (primary) hypertension: Secondary | ICD-10-CM | POA: Diagnosis not present

## 2016-08-20 DIAGNOSIS — Z950 Presence of cardiac pacemaker: Secondary | ICD-10-CM | POA: Diagnosis not present

## 2016-08-20 DIAGNOSIS — I442 Atrioventricular block, complete: Secondary | ICD-10-CM | POA: Diagnosis not present

## 2016-08-20 DIAGNOSIS — M6281 Muscle weakness (generalized): Secondary | ICD-10-CM | POA: Diagnosis not present

## 2016-08-20 DIAGNOSIS — E119 Type 2 diabetes mellitus without complications: Secondary | ICD-10-CM | POA: Diagnosis not present

## 2016-08-23 DIAGNOSIS — M6281 Muscle weakness (generalized): Secondary | ICD-10-CM | POA: Diagnosis not present

## 2016-08-23 DIAGNOSIS — I509 Heart failure, unspecified: Secondary | ICD-10-CM | POA: Diagnosis not present

## 2016-08-23 DIAGNOSIS — I4891 Unspecified atrial fibrillation: Secondary | ICD-10-CM | POA: Diagnosis not present

## 2016-08-23 DIAGNOSIS — Z950 Presence of cardiac pacemaker: Secondary | ICD-10-CM | POA: Diagnosis not present

## 2016-08-23 DIAGNOSIS — G20 Parkinson's disease: Secondary | ICD-10-CM | POA: Diagnosis not present

## 2016-08-23 DIAGNOSIS — E119 Type 2 diabetes mellitus without complications: Secondary | ICD-10-CM | POA: Diagnosis not present

## 2016-08-23 DIAGNOSIS — I442 Atrioventricular block, complete: Secondary | ICD-10-CM | POA: Diagnosis not present

## 2016-08-23 DIAGNOSIS — I10 Essential (primary) hypertension: Secondary | ICD-10-CM | POA: Diagnosis not present

## 2016-08-23 DIAGNOSIS — J189 Pneumonia, unspecified organism: Secondary | ICD-10-CM | POA: Diagnosis not present

## 2016-08-24 DIAGNOSIS — I509 Heart failure, unspecified: Secondary | ICD-10-CM | POA: Diagnosis not present

## 2016-08-24 DIAGNOSIS — I4891 Unspecified atrial fibrillation: Secondary | ICD-10-CM | POA: Diagnosis not present

## 2016-08-24 DIAGNOSIS — D649 Anemia, unspecified: Secondary | ICD-10-CM | POA: Diagnosis not present

## 2016-08-24 DIAGNOSIS — Z950 Presence of cardiac pacemaker: Secondary | ICD-10-CM | POA: Diagnosis not present

## 2016-08-24 DIAGNOSIS — J189 Pneumonia, unspecified organism: Secondary | ICD-10-CM | POA: Diagnosis not present

## 2016-08-24 DIAGNOSIS — R718 Other abnormality of red blood cells: Secondary | ICD-10-CM | POA: Diagnosis not present

## 2016-08-24 DIAGNOSIS — G20 Parkinson's disease: Secondary | ICD-10-CM | POA: Diagnosis not present

## 2016-08-24 DIAGNOSIS — I442 Atrioventricular block, complete: Secondary | ICD-10-CM | POA: Diagnosis not present

## 2016-08-24 DIAGNOSIS — I10 Essential (primary) hypertension: Secondary | ICD-10-CM | POA: Diagnosis not present

## 2016-08-24 DIAGNOSIS — E119 Type 2 diabetes mellitus without complications: Secondary | ICD-10-CM | POA: Diagnosis not present

## 2016-08-24 DIAGNOSIS — M6281 Muscle weakness (generalized): Secondary | ICD-10-CM | POA: Diagnosis not present

## 2016-08-25 DIAGNOSIS — I4891 Unspecified atrial fibrillation: Secondary | ICD-10-CM | POA: Diagnosis not present

## 2016-08-25 DIAGNOSIS — G20 Parkinson's disease: Secondary | ICD-10-CM | POA: Diagnosis not present

## 2016-08-25 DIAGNOSIS — I509 Heart failure, unspecified: Secondary | ICD-10-CM | POA: Diagnosis not present

## 2016-08-25 DIAGNOSIS — E119 Type 2 diabetes mellitus without complications: Secondary | ICD-10-CM | POA: Diagnosis not present

## 2016-08-25 DIAGNOSIS — I10 Essential (primary) hypertension: Secondary | ICD-10-CM | POA: Diagnosis not present

## 2016-08-25 DIAGNOSIS — Z950 Presence of cardiac pacemaker: Secondary | ICD-10-CM | POA: Diagnosis not present

## 2016-08-25 DIAGNOSIS — I442 Atrioventricular block, complete: Secondary | ICD-10-CM | POA: Diagnosis not present

## 2016-08-25 DIAGNOSIS — J189 Pneumonia, unspecified organism: Secondary | ICD-10-CM | POA: Diagnosis not present

## 2016-08-25 DIAGNOSIS — M6281 Muscle weakness (generalized): Secondary | ICD-10-CM | POA: Diagnosis not present

## 2016-08-26 DIAGNOSIS — Z950 Presence of cardiac pacemaker: Secondary | ICD-10-CM | POA: Diagnosis not present

## 2016-08-26 DIAGNOSIS — M6281 Muscle weakness (generalized): Secondary | ICD-10-CM | POA: Diagnosis not present

## 2016-08-26 DIAGNOSIS — G20 Parkinson's disease: Secondary | ICD-10-CM | POA: Diagnosis not present

## 2016-08-26 DIAGNOSIS — E119 Type 2 diabetes mellitus without complications: Secondary | ICD-10-CM | POA: Diagnosis not present

## 2016-08-26 DIAGNOSIS — I10 Essential (primary) hypertension: Secondary | ICD-10-CM | POA: Diagnosis not present

## 2016-08-26 DIAGNOSIS — I4891 Unspecified atrial fibrillation: Secondary | ICD-10-CM | POA: Diagnosis not present

## 2016-08-26 DIAGNOSIS — I442 Atrioventricular block, complete: Secondary | ICD-10-CM | POA: Diagnosis not present

## 2016-08-26 DIAGNOSIS — I509 Heart failure, unspecified: Secondary | ICD-10-CM | POA: Diagnosis not present

## 2016-08-26 DIAGNOSIS — J189 Pneumonia, unspecified organism: Secondary | ICD-10-CM | POA: Diagnosis not present

## 2016-08-27 DIAGNOSIS — J189 Pneumonia, unspecified organism: Secondary | ICD-10-CM | POA: Diagnosis not present

## 2016-08-27 DIAGNOSIS — I442 Atrioventricular block, complete: Secondary | ICD-10-CM | POA: Diagnosis not present

## 2016-08-27 DIAGNOSIS — E119 Type 2 diabetes mellitus without complications: Secondary | ICD-10-CM | POA: Diagnosis not present

## 2016-08-27 DIAGNOSIS — I4891 Unspecified atrial fibrillation: Secondary | ICD-10-CM | POA: Diagnosis not present

## 2016-08-27 DIAGNOSIS — I509 Heart failure, unspecified: Secondary | ICD-10-CM | POA: Diagnosis not present

## 2016-08-27 DIAGNOSIS — I10 Essential (primary) hypertension: Secondary | ICD-10-CM | POA: Diagnosis not present

## 2016-08-27 DIAGNOSIS — Z950 Presence of cardiac pacemaker: Secondary | ICD-10-CM | POA: Diagnosis not present

## 2016-08-27 DIAGNOSIS — G20 Parkinson's disease: Secondary | ICD-10-CM | POA: Diagnosis not present

## 2016-08-27 DIAGNOSIS — M6281 Muscle weakness (generalized): Secondary | ICD-10-CM | POA: Diagnosis not present

## 2016-08-30 DIAGNOSIS — I4891 Unspecified atrial fibrillation: Secondary | ICD-10-CM | POA: Diagnosis not present

## 2016-08-30 DIAGNOSIS — I442 Atrioventricular block, complete: Secondary | ICD-10-CM | POA: Diagnosis not present

## 2016-08-30 DIAGNOSIS — J189 Pneumonia, unspecified organism: Secondary | ICD-10-CM | POA: Diagnosis not present

## 2016-08-30 DIAGNOSIS — M6281 Muscle weakness (generalized): Secondary | ICD-10-CM | POA: Diagnosis not present

## 2016-08-30 DIAGNOSIS — I509 Heart failure, unspecified: Secondary | ICD-10-CM | POA: Diagnosis not present

## 2016-08-30 DIAGNOSIS — Z950 Presence of cardiac pacemaker: Secondary | ICD-10-CM | POA: Diagnosis not present

## 2016-08-30 DIAGNOSIS — E119 Type 2 diabetes mellitus without complications: Secondary | ICD-10-CM | POA: Diagnosis not present

## 2016-08-30 DIAGNOSIS — G20 Parkinson's disease: Secondary | ICD-10-CM | POA: Diagnosis not present

## 2016-08-30 DIAGNOSIS — I10 Essential (primary) hypertension: Secondary | ICD-10-CM | POA: Diagnosis not present

## 2016-08-31 DIAGNOSIS — E559 Vitamin D deficiency, unspecified: Secondary | ICD-10-CM | POA: Diagnosis not present

## 2016-08-31 DIAGNOSIS — Z950 Presence of cardiac pacemaker: Secondary | ICD-10-CM | POA: Diagnosis not present

## 2016-08-31 DIAGNOSIS — G20 Parkinson's disease: Secondary | ICD-10-CM | POA: Diagnosis not present

## 2016-08-31 DIAGNOSIS — J189 Pneumonia, unspecified organism: Secondary | ICD-10-CM | POA: Diagnosis not present

## 2016-08-31 DIAGNOSIS — M6281 Muscle weakness (generalized): Secondary | ICD-10-CM | POA: Diagnosis not present

## 2016-08-31 DIAGNOSIS — I442 Atrioventricular block, complete: Secondary | ICD-10-CM | POA: Diagnosis not present

## 2016-08-31 DIAGNOSIS — N400 Enlarged prostate without lower urinary tract symptoms: Secondary | ICD-10-CM | POA: Diagnosis not present

## 2016-08-31 DIAGNOSIS — R413 Other amnesia: Secondary | ICD-10-CM | POA: Diagnosis not present

## 2016-08-31 DIAGNOSIS — E782 Mixed hyperlipidemia: Secondary | ICD-10-CM | POA: Diagnosis not present

## 2016-08-31 DIAGNOSIS — C61 Malignant neoplasm of prostate: Secondary | ICD-10-CM | POA: Diagnosis not present

## 2016-08-31 DIAGNOSIS — I10 Essential (primary) hypertension: Secondary | ICD-10-CM | POA: Diagnosis not present

## 2016-08-31 DIAGNOSIS — N183 Chronic kidney disease, stage 3 (moderate): Secondary | ICD-10-CM | POA: Diagnosis not present

## 2016-08-31 DIAGNOSIS — I4891 Unspecified atrial fibrillation: Secondary | ICD-10-CM | POA: Diagnosis not present

## 2016-08-31 DIAGNOSIS — I509 Heart failure, unspecified: Secondary | ICD-10-CM | POA: Diagnosis not present

## 2016-08-31 DIAGNOSIS — Z0000 Encounter for general adult medical examination without abnormal findings: Secondary | ICD-10-CM | POA: Diagnosis not present

## 2016-08-31 DIAGNOSIS — E119 Type 2 diabetes mellitus without complications: Secondary | ICD-10-CM | POA: Diagnosis not present

## 2016-08-31 DIAGNOSIS — I2510 Atherosclerotic heart disease of native coronary artery without angina pectoris: Secondary | ICD-10-CM | POA: Diagnosis not present

## 2016-09-01 DIAGNOSIS — G20 Parkinson's disease: Secondary | ICD-10-CM | POA: Diagnosis not present

## 2016-09-01 DIAGNOSIS — E119 Type 2 diabetes mellitus without complications: Secondary | ICD-10-CM | POA: Diagnosis not present

## 2016-09-01 DIAGNOSIS — I442 Atrioventricular block, complete: Secondary | ICD-10-CM | POA: Diagnosis not present

## 2016-09-01 DIAGNOSIS — I509 Heart failure, unspecified: Secondary | ICD-10-CM | POA: Diagnosis not present

## 2016-09-01 DIAGNOSIS — I10 Essential (primary) hypertension: Secondary | ICD-10-CM | POA: Diagnosis not present

## 2016-09-01 DIAGNOSIS — M6281 Muscle weakness (generalized): Secondary | ICD-10-CM | POA: Diagnosis not present

## 2016-09-01 DIAGNOSIS — I4891 Unspecified atrial fibrillation: Secondary | ICD-10-CM | POA: Diagnosis not present

## 2016-09-01 DIAGNOSIS — J189 Pneumonia, unspecified organism: Secondary | ICD-10-CM | POA: Diagnosis not present

## 2016-09-01 DIAGNOSIS — Z950 Presence of cardiac pacemaker: Secondary | ICD-10-CM | POA: Diagnosis not present

## 2016-09-02 DIAGNOSIS — G20 Parkinson's disease: Secondary | ICD-10-CM | POA: Diagnosis not present

## 2016-09-02 DIAGNOSIS — Z950 Presence of cardiac pacemaker: Secondary | ICD-10-CM | POA: Diagnosis not present

## 2016-09-02 DIAGNOSIS — I442 Atrioventricular block, complete: Secondary | ICD-10-CM | POA: Diagnosis not present

## 2016-09-02 DIAGNOSIS — I4891 Unspecified atrial fibrillation: Secondary | ICD-10-CM | POA: Diagnosis not present

## 2016-09-02 DIAGNOSIS — J189 Pneumonia, unspecified organism: Secondary | ICD-10-CM | POA: Diagnosis not present

## 2016-09-02 DIAGNOSIS — M6281 Muscle weakness (generalized): Secondary | ICD-10-CM | POA: Diagnosis not present

## 2016-09-02 DIAGNOSIS — I509 Heart failure, unspecified: Secondary | ICD-10-CM | POA: Diagnosis not present

## 2016-09-02 DIAGNOSIS — E119 Type 2 diabetes mellitus without complications: Secondary | ICD-10-CM | POA: Diagnosis not present

## 2016-09-02 DIAGNOSIS — I10 Essential (primary) hypertension: Secondary | ICD-10-CM | POA: Diagnosis not present

## 2016-09-06 ENCOUNTER — Other Ambulatory Visit: Payer: Self-pay | Admitting: Cardiovascular Disease

## 2016-09-06 DIAGNOSIS — I10 Essential (primary) hypertension: Secondary | ICD-10-CM | POA: Diagnosis not present

## 2016-09-06 DIAGNOSIS — I4891 Unspecified atrial fibrillation: Secondary | ICD-10-CM | POA: Diagnosis not present

## 2016-09-06 DIAGNOSIS — M6281 Muscle weakness (generalized): Secondary | ICD-10-CM | POA: Diagnosis not present

## 2016-09-06 DIAGNOSIS — J189 Pneumonia, unspecified organism: Secondary | ICD-10-CM | POA: Diagnosis not present

## 2016-09-06 DIAGNOSIS — I442 Atrioventricular block, complete: Secondary | ICD-10-CM | POA: Diagnosis not present

## 2016-09-06 DIAGNOSIS — G20 Parkinson's disease: Secondary | ICD-10-CM | POA: Diagnosis not present

## 2016-09-06 DIAGNOSIS — E119 Type 2 diabetes mellitus without complications: Secondary | ICD-10-CM | POA: Diagnosis not present

## 2016-09-06 DIAGNOSIS — I509 Heart failure, unspecified: Secondary | ICD-10-CM | POA: Diagnosis not present

## 2016-09-06 DIAGNOSIS — Z950 Presence of cardiac pacemaker: Secondary | ICD-10-CM | POA: Diagnosis not present

## 2016-09-07 NOTE — Telephone Encounter (Signed)
REFILL 

## 2016-09-08 DIAGNOSIS — Z950 Presence of cardiac pacemaker: Secondary | ICD-10-CM | POA: Diagnosis not present

## 2016-09-08 DIAGNOSIS — I4891 Unspecified atrial fibrillation: Secondary | ICD-10-CM | POA: Diagnosis not present

## 2016-09-08 DIAGNOSIS — J189 Pneumonia, unspecified organism: Secondary | ICD-10-CM | POA: Diagnosis not present

## 2016-09-08 DIAGNOSIS — I509 Heart failure, unspecified: Secondary | ICD-10-CM | POA: Diagnosis not present

## 2016-09-08 DIAGNOSIS — L814 Other melanin hyperpigmentation: Secondary | ICD-10-CM | POA: Diagnosis not present

## 2016-09-08 DIAGNOSIS — D492 Neoplasm of unspecified behavior of bone, soft tissue, and skin: Secondary | ICD-10-CM | POA: Diagnosis not present

## 2016-09-08 DIAGNOSIS — I10 Essential (primary) hypertension: Secondary | ICD-10-CM | POA: Diagnosis not present

## 2016-09-08 DIAGNOSIS — G20 Parkinson's disease: Secondary | ICD-10-CM | POA: Diagnosis not present

## 2016-09-08 DIAGNOSIS — L570 Actinic keratosis: Secondary | ICD-10-CM | POA: Diagnosis not present

## 2016-09-08 DIAGNOSIS — I442 Atrioventricular block, complete: Secondary | ICD-10-CM | POA: Diagnosis not present

## 2016-09-08 DIAGNOSIS — E119 Type 2 diabetes mellitus without complications: Secondary | ICD-10-CM | POA: Diagnosis not present

## 2016-09-08 DIAGNOSIS — M6281 Muscle weakness (generalized): Secondary | ICD-10-CM | POA: Diagnosis not present

## 2016-09-08 DIAGNOSIS — D0439 Carcinoma in situ of skin of other parts of face: Secondary | ICD-10-CM | POA: Diagnosis not present

## 2016-09-10 DIAGNOSIS — I10 Essential (primary) hypertension: Secondary | ICD-10-CM | POA: Diagnosis not present

## 2016-09-10 DIAGNOSIS — Z950 Presence of cardiac pacemaker: Secondary | ICD-10-CM | POA: Diagnosis not present

## 2016-09-10 DIAGNOSIS — J189 Pneumonia, unspecified organism: Secondary | ICD-10-CM | POA: Diagnosis not present

## 2016-09-10 DIAGNOSIS — E119 Type 2 diabetes mellitus without complications: Secondary | ICD-10-CM | POA: Diagnosis not present

## 2016-09-10 DIAGNOSIS — I442 Atrioventricular block, complete: Secondary | ICD-10-CM | POA: Diagnosis not present

## 2016-09-10 DIAGNOSIS — I4891 Unspecified atrial fibrillation: Secondary | ICD-10-CM | POA: Diagnosis not present

## 2016-09-10 DIAGNOSIS — I509 Heart failure, unspecified: Secondary | ICD-10-CM | POA: Diagnosis not present

## 2016-09-10 DIAGNOSIS — G20 Parkinson's disease: Secondary | ICD-10-CM | POA: Diagnosis not present

## 2016-09-10 DIAGNOSIS — M6281 Muscle weakness (generalized): Secondary | ICD-10-CM | POA: Diagnosis not present

## 2016-09-13 DIAGNOSIS — I442 Atrioventricular block, complete: Secondary | ICD-10-CM | POA: Diagnosis not present

## 2016-09-13 DIAGNOSIS — E119 Type 2 diabetes mellitus without complications: Secondary | ICD-10-CM | POA: Diagnosis not present

## 2016-09-13 DIAGNOSIS — J189 Pneumonia, unspecified organism: Secondary | ICD-10-CM | POA: Diagnosis not present

## 2016-09-13 DIAGNOSIS — Z950 Presence of cardiac pacemaker: Secondary | ICD-10-CM | POA: Diagnosis not present

## 2016-09-13 DIAGNOSIS — I10 Essential (primary) hypertension: Secondary | ICD-10-CM | POA: Diagnosis not present

## 2016-09-13 DIAGNOSIS — I4891 Unspecified atrial fibrillation: Secondary | ICD-10-CM | POA: Diagnosis not present

## 2016-09-13 DIAGNOSIS — I509 Heart failure, unspecified: Secondary | ICD-10-CM | POA: Diagnosis not present

## 2016-09-13 DIAGNOSIS — G20 Parkinson's disease: Secondary | ICD-10-CM | POA: Diagnosis not present

## 2016-09-13 DIAGNOSIS — M6281 Muscle weakness (generalized): Secondary | ICD-10-CM | POA: Diagnosis not present

## 2016-09-14 ENCOUNTER — Other Ambulatory Visit: Payer: Self-pay | Admitting: Cardiovascular Disease

## 2016-09-20 DIAGNOSIS — I10 Essential (primary) hypertension: Secondary | ICD-10-CM | POA: Diagnosis not present

## 2016-09-20 DIAGNOSIS — Z950 Presence of cardiac pacemaker: Secondary | ICD-10-CM | POA: Diagnosis not present

## 2016-09-20 DIAGNOSIS — I4891 Unspecified atrial fibrillation: Secondary | ICD-10-CM | POA: Diagnosis not present

## 2016-09-20 DIAGNOSIS — G20 Parkinson's disease: Secondary | ICD-10-CM | POA: Diagnosis not present

## 2016-09-20 DIAGNOSIS — J189 Pneumonia, unspecified organism: Secondary | ICD-10-CM | POA: Diagnosis not present

## 2016-09-20 DIAGNOSIS — M6281 Muscle weakness (generalized): Secondary | ICD-10-CM | POA: Diagnosis not present

## 2016-09-20 DIAGNOSIS — I442 Atrioventricular block, complete: Secondary | ICD-10-CM | POA: Diagnosis not present

## 2016-09-20 DIAGNOSIS — E119 Type 2 diabetes mellitus without complications: Secondary | ICD-10-CM | POA: Diagnosis not present

## 2016-09-20 DIAGNOSIS — I509 Heart failure, unspecified: Secondary | ICD-10-CM | POA: Diagnosis not present

## 2016-09-23 DIAGNOSIS — I4891 Unspecified atrial fibrillation: Secondary | ICD-10-CM | POA: Diagnosis not present

## 2016-09-23 DIAGNOSIS — I10 Essential (primary) hypertension: Secondary | ICD-10-CM | POA: Diagnosis not present

## 2016-09-23 DIAGNOSIS — I442 Atrioventricular block, complete: Secondary | ICD-10-CM | POA: Diagnosis not present

## 2016-09-23 DIAGNOSIS — G20 Parkinson's disease: Secondary | ICD-10-CM | POA: Diagnosis not present

## 2016-09-23 DIAGNOSIS — Z950 Presence of cardiac pacemaker: Secondary | ICD-10-CM | POA: Diagnosis not present

## 2016-09-23 DIAGNOSIS — J189 Pneumonia, unspecified organism: Secondary | ICD-10-CM | POA: Diagnosis not present

## 2016-09-23 DIAGNOSIS — M6281 Muscle weakness (generalized): Secondary | ICD-10-CM | POA: Diagnosis not present

## 2016-09-23 DIAGNOSIS — E119 Type 2 diabetes mellitus without complications: Secondary | ICD-10-CM | POA: Diagnosis not present

## 2016-09-23 DIAGNOSIS — I509 Heart failure, unspecified: Secondary | ICD-10-CM | POA: Diagnosis not present

## 2016-10-01 DIAGNOSIS — E1159 Type 2 diabetes mellitus with other circulatory complications: Secondary | ICD-10-CM | POA: Diagnosis not present

## 2016-10-01 DIAGNOSIS — E1165 Type 2 diabetes mellitus with hyperglycemia: Secondary | ICD-10-CM | POA: Diagnosis not present

## 2016-10-01 DIAGNOSIS — N183 Chronic kidney disease, stage 3 (moderate): Secondary | ICD-10-CM | POA: Diagnosis not present

## 2016-10-01 DIAGNOSIS — C61 Malignant neoplasm of prostate: Secondary | ICD-10-CM | POA: Diagnosis not present

## 2016-10-01 DIAGNOSIS — E782 Mixed hyperlipidemia: Secondary | ICD-10-CM | POA: Diagnosis not present

## 2016-10-01 DIAGNOSIS — E1122 Type 2 diabetes mellitus with diabetic chronic kidney disease: Secondary | ICD-10-CM | POA: Diagnosis not present

## 2016-10-01 DIAGNOSIS — E559 Vitamin D deficiency, unspecified: Secondary | ICD-10-CM | POA: Diagnosis not present

## 2016-10-01 DIAGNOSIS — I2510 Atherosclerotic heart disease of native coronary artery without angina pectoris: Secondary | ICD-10-CM | POA: Diagnosis not present

## 2016-10-07 ENCOUNTER — Ambulatory Visit (INDEPENDENT_AMBULATORY_CARE_PROVIDER_SITE_OTHER): Payer: Medicare HMO | Admitting: *Deleted

## 2016-10-07 DIAGNOSIS — I442 Atrioventricular block, complete: Secondary | ICD-10-CM

## 2016-10-08 NOTE — Progress Notes (Signed)
Remote pacemaker transmission.   

## 2016-10-14 LAB — CUP PACEART REMOTE DEVICE CHECK
Brady Statistic RV Percent Paced: 90 %
Date Time Interrogation Session: 20180531135856
Implantable Lead Location: 753860
Implantable Lead Model: 5076
Lead Channel Impedance Value: 506 Ohm
Lead Channel Impedance Value: 67 Ohm
Lead Channel Pacing Threshold Amplitude: 1 V
Lead Channel Setting Pacing Amplitude: 2.5 V
Lead Channel Setting Pacing Pulse Width: 0.4 ms
MDC IDC LEAD IMPLANT DT: 20080804
MDC IDC LEAD IMPLANT DT: 20080804
MDC IDC LEAD LOCATION: 753859
MDC IDC MSMT BATTERY IMPEDANCE: 3407 Ohm
MDC IDC MSMT BATTERY REMAINING LONGEVITY: 19 mo
MDC IDC MSMT BATTERY VOLTAGE: 2.67 V
MDC IDC MSMT LEADCHNL RV PACING THRESHOLD PULSEWIDTH: 0.4 ms
MDC IDC PG IMPLANT DT: 20080804
MDC IDC SET LEADCHNL RV SENSING SENSITIVITY: 2.8 mV

## 2016-10-15 ENCOUNTER — Encounter: Payer: Self-pay | Admitting: Cardiology

## 2016-11-05 DIAGNOSIS — H524 Presbyopia: Secondary | ICD-10-CM | POA: Diagnosis not present

## 2016-11-05 DIAGNOSIS — H353132 Nonexudative age-related macular degeneration, bilateral, intermediate dry stage: Secondary | ICD-10-CM | POA: Diagnosis not present

## 2016-11-16 ENCOUNTER — Other Ambulatory Visit: Payer: Self-pay | Admitting: Cardiovascular Disease

## 2016-11-16 DIAGNOSIS — I4892 Unspecified atrial flutter: Secondary | ICD-10-CM

## 2016-11-17 NOTE — Telephone Encounter (Signed)
Baseline Scr 1.2; increased to 1.5 on 07/2016 Need f/u SCR prior to next refill authorization

## 2016-12-01 DIAGNOSIS — L570 Actinic keratosis: Secondary | ICD-10-CM | POA: Diagnosis not present

## 2016-12-01 DIAGNOSIS — L821 Other seborrheic keratosis: Secondary | ICD-10-CM | POA: Diagnosis not present

## 2016-12-01 DIAGNOSIS — D0439 Carcinoma in situ of skin of other parts of face: Secondary | ICD-10-CM | POA: Diagnosis not present

## 2016-12-01 DIAGNOSIS — C44612 Basal cell carcinoma of skin of right upper limb, including shoulder: Secondary | ICD-10-CM | POA: Diagnosis not present

## 2016-12-01 DIAGNOSIS — D229 Melanocytic nevi, unspecified: Secondary | ICD-10-CM | POA: Diagnosis not present

## 2016-12-31 ENCOUNTER — Encounter: Payer: Self-pay | Admitting: Diagnostic Neuroimaging

## 2016-12-31 ENCOUNTER — Ambulatory Visit (INDEPENDENT_AMBULATORY_CARE_PROVIDER_SITE_OTHER): Payer: Medicare HMO | Admitting: Diagnostic Neuroimaging

## 2016-12-31 VITALS — BP 128/72 | HR 64 | Ht 68.0 in | Wt 183.0 lb

## 2016-12-31 DIAGNOSIS — G20 Parkinson's disease: Secondary | ICD-10-CM

## 2016-12-31 DIAGNOSIS — R269 Unspecified abnormalities of gait and mobility: Secondary | ICD-10-CM | POA: Diagnosis not present

## 2016-12-31 DIAGNOSIS — E1142 Type 2 diabetes mellitus with diabetic polyneuropathy: Secondary | ICD-10-CM

## 2016-12-31 DIAGNOSIS — G20A1 Parkinson's disease without dyskinesia, without mention of fluctuations: Secondary | ICD-10-CM

## 2016-12-31 MED ORDER — CARBIDOPA-LEVODOPA 25-100 MG PO TABS: 1.0000 | ORAL_TABLET | Freq: Three times a day (TID) | ORAL | 4 refills | Status: DC

## 2016-12-31 NOTE — Progress Notes (Signed)
[facility]  PATIENT: [name] Powell DOB: Sep 22, 1922  REFERRING CLINICIAN: Edwin Dada, MD HISTORY FROM: patient, wife REASON FOR VISIT: follow up   HISTORICAL  CHIEF COMPLAINT:  Chief Complaint  Patient presents with  . Follow-up    room 7, pt with wife. pt states things are going well.    HISTORY OF PRESENT ILLNESS:   UPDATE 12/31/16: Since last visit, feels well. Tolerating carb/levo. No major changes. No falls.   UPDATE 07/02/16: Since last visit, doing well. Tolerating carb/levo (10am, 5pm, 10pm). Wife administers meds. No on-off fluctuations. No falls. No injuries. Using cane. Some left sided abd pain x 2 months; no tenderness to palpation. No pain with breathing. No injuries.   UPDATE 12/30/15: Since last visit tried coming off carb/levo, and then had worsening mobility and energy. Now back on carb/levo and doing better.   UPDATE 09/25/15: Since last visit, doing better, except for hosp stay for pneumonia in April 2017. Patient doesn't feel carb/levo is helping, but family think he has more energy now. PT also helping. Now on home O2 since past 1 week.  PRIOR HPI (08/13/15): 83 year old right-handed male here for evaluation of gait instability, memory loss, frequent falling.  For past 2 years patient has had increasing problems with balance, walking, coordination and increasing falls. Over the past 3 weeks patient has had generalized weakness, walking on toes, increasing shuffling gait. Patient also having problems with short-term memory, confusion, difficulty with tasks such as crossword puzzles which he used to be able to do easily. Patient is a retired Art gallery manager and was very high functioning from intellectual standpoint. Patient has had significant decline from his previous level of functioning. Patient has developed quiet hoarse voice, word finding difficulties, decreased facial expressions. In general he has slowed down quite a bit.  Patient also has  constipation and urinary incontinence. He has tendency to punch and kick in his sleep and act out his dreams although this is been going on for more than 25 years according to patient's wife. No change in sense of smell or taste.   REVIEW OF SYSTEMS: Full 14 system review of systems performed and negative with exception of: only as per HPI   ALLERGIES: Allergies  Allergen Reactions  . Lanoxin [Digoxin] Rash    Eyelid rash  . Mexitil [Mexiletine] Other (See Comments)    unknown    HOME MEDICATIONS: Outpatient Medications Prior to Visit  Medication Sig Dispense Refill  . atorvastatin (LIPITOR) 20 MG tablet Take 20 mg by mouth daily.    . benzonatate (TESSALON) 100 MG capsule Take 100 mg by mouth 3 (three) times daily as needed for cough.  0  . carbidopa-levodopa (SINEMET IR) 25-100 MG tablet Take 1 tablet by mouth 3 (three) times daily. 270 tablet 4  . Cholecalciferol (VITAMIN D PO) Take 10,000 Units by mouth every Friday. On Friday     . ELIQUIS 5 MG TABS tablet TAKE 1 TABLET TWICE DAILY 180 tablet 0  . furosemide (LASIX) 40 MG tablet Take 1 tablet (40 mg total) by mouth daily. 30 tablet 0  . glimepiride (AMARYL) 4 MG tablet Take 4 mg by mouth daily before breakfast.    . JANUVIA 50 MG tablet Take 1 tablet by mouth every evening.     . metFORMIN (GLUCOPHAGE-XR) 500 MG 24 hr tablet Take 500 mg by mouth daily with breakfast.     . metoprolol (LOPRESSOR) 50 MG tablet TAKE 2 TABLETS IN THE MORNING  AND TAKE  1 TABLET IN THE EVENING 270 tablet 1  . mometasone (ELOCON) 0.1 % cream APPLY TO LEGS AS NEEDED FOR FOOT DERMATITIS  0  . Multiple Vitamin (MULTIVITAMIN WITH MINERALS) TABS tablet Take 1 tablet by mouth daily.    . Multiple Vitamins-Minerals (PRESERVISION AREDS) CAPS Take 1 capsule by mouth 2 (two) times daily.    . polyethylene glycol (MIRALAX / GLYCOLAX) packet Take 17 g by mouth daily. To prevent constipation     . potassium chloride (K-DUR) 10 MEQ tablet TAKE 1 TABLET (10 MEQ TOTAL)  BY MOUTH DAILY ON TUESDAY THURSDAY SATURDAY AND SUNDAY (Patient taking differently: take 1 tab daily) 52 tablet 1  . valsartan (DIOVAN) 160 MG tablet TAKE 1 TABLET EVERY DAY 90 tablet 2  . albuterol (PROVENTIL HFA;VENTOLIN HFA) 108 (90 Base) MCG/ACT inhaler Inhale 2 puffs into the lungs every 6 (six) hours as needed for wheezing or shortness of breath. 1 Inhaler 2  . doxycycline (MONODOX) 100 MG capsule Take 1 capsule (100 mg total) by mouth 2 (two) times daily. 10 capsule 0  . guaiFENesin (MUCINEX) 600 MG 12 hr tablet Take 1 tablet (600 mg total) by mouth 2 (two) times daily. 20 tablet 0  . predniSONE (DELTASONE) 10 MG tablet 40 mg x 1 days, 30 mg x 1 days, 20 mg x 1 day, 10 mg x 1 day then stop 10 tablet 0   No facility-administered medications prior to visit.     PAST MEDICAL HISTORY: Past Medical History:  Diagnosis Date  . Atrial fibrillation ([place])   . Cancer ([place])   . CHB (complete heart block) ([place])   . Coronary artery disease   . Diabetes mellitus   . Dyslipidemia   . H/O prostate cancer   . Hypertension   . New onset atrial flutter, persistent 07/04/2014  . Pacemaker 12/12/2006   Medtronic adapta  . Pleural effusion, left   . Pneumonia 09/2015  . Prostate cancer ([place])   . 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 Ventricular tachycardia (paroxysmal) ([place]) 03/28/2014    PAST SURGICAL HISTORY: Past Surgical History:  Procedure Laterality Date  . CORONARY ARTERY BYPASS GRAFT  09/04/2001   LIMA to LAD,SVG to diagonal,SVG to ramus intermedius,SVG to PDA  . NM MYOVIEW LTD  01/09/2010   no ischemia  . PACEMAKER INSERTION  12/12/2006   Medtronic adapta  . PROSTATE SURGERY  2001   cancer  . TONSILLECTOMY      FAMILY HISTORY: Family History  Problem Relation Age of Onset  . Heart disease Mother     SOCIAL HISTORY:  Social History   Social History  . Marital status: Married    Spouse name: Inez Catalina  . Number of children: 2   . Years of education: 16   Occupational History  .      retired Art gallery manager   Social History Main Topics  . Smoking status: Former Smoker    Types: Cigarettes, Cigars    Quit date: 05/14/1962  . Smokeless tobacco: Never Used  . Alcohol use Yes     Comment: seldom  . Drug use: No  . Sexual activity: Not on file   Other Topics Concern  . Not on file   Social History Narrative   Lives at home with wife   Caffeine -seldom     PHYSICAL EXAM  GENERAL EXAM/CONSTITUTIONAL: Vitals:  Vitals:   12/31/16 0805  BP: 128/72  Pulse: 64  Weight: 183 lb (83 kg)  Height: 5\' 8"  (1.727 m)   Body mass index is 27.83 kg/m. No exam data present  Patient is in no distress; well developed, nourished and groomed; neck is supple  CARDIOVASCULAR:  Examination of carotid arteries is normal; no carotid bruits  Regular rate and rhythm, no murmurs  Examination of peripheral vascular system by observation and palpation is normal  EYES:  Ophthalmoscopic exam of optic discs and posterior segments is normal; no papilledema or hemorrhages  MUSCULOSKELETAL:  Gait, strength, tone, movements noted in Neurologic exam below  NEUROLOGIC: MENTAL STATUS:  MMSE - Mini Mental State Exam 08/13/2015  Orientation to time 5  Orientation to Place 5  Registration 3  Attention/ Calculation 2  Recall 2  Language- name 2 objects 2  Language- repeat 1  Language- follow 3 step command 3  Language- read & follow direction 1  Write a sentence 1  Copy design 1  Total score 26    awake, alert, oriented to person, place and time  recent and remote memory intact  normal attention and concentration  language fluent, comprehension intact, naming intact,   fund of knowledge appropriate  CRANIAL NERVE:   2nd - no papilledema on fundoscopic exam  2nd, 3rd, 4th, 6th - pupils equal and reactive to light, visual fields full to confrontation, extraocular muscles intact, no nystagmus  5th -  facial sensation symmetric  7th - facial strength symmetric  8th - hearing intact  9th - palate elevates symmetrically, uvula midline  11th - shoulder shrug symmetric  12th - tongue protrusion midline  HOARSE VOICE  MASKED FACIES  MOTOR:   normal bulk  MILD COGWHEELING RIGIDITY IN BUE  MODERATE BRADYKINESIA IN BLE; MILD BRADYKINESIA IN BUE  full strength in the BUE, BLE  SENSORY:   normal and symmetric to light touch, temperature  DECR VIB AT TOES AND ANKLES  COORDINATION:   finger-nose-finger, fine finger movements normal  REFLEXES:   deep tendon reflexes TRACE IN BUE; ABSENT IN BLE  POSITIVE MYERSONS  NEGATIVE SNOUT  GAIT/STATION:   narrow based gait; SLOW TO RISE; STOOPED POSTURE; DECR ARM SWING; SHORT STEPS; HAS SINGLE POINT CANE BUT ABLE TO WALK SHORT DISTANCES WITHOUT CANE    DIAGNOSTIC DATA (LABS, IMAGING, TESTING) - I reviewed patient records, labs, notes, testing and imaging myself where available.  Lab Results  Component Value Date   WBC 12.8 (H) 07/29/2016   HGB 9.8 (L) 07/29/2016   HCT 30.9 (L) 07/29/2016   MCV 97.8 07/29/2016   PLT 158 07/29/2016      Component Value Date/Time   NA 138 07/29/2016 0432   K 4.1 07/29/2016 0432   CL 98 (L) 07/29/2016 0432   CO2 28 07/29/2016 0432   GLUCOSE 283 (H) 07/29/2016 0432   BUN 38 (H) 07/29/2016 0432   CREATININE 1.51 (H) 07/29/2016 0432   CREATININE 1.16 (H) 10/07/2015 1223   CALCIUM 7.8 (L) 07/29/2016 0432   PROT 6.1 (L) 07/27/2016 0449   ALBUMIN 3.0 (L) 07/27/2016 0449   AST 30 07/27/2016 0449   ALT 27 07/27/2016 0449   ALKPHOS 91 07/27/2016 0449   BILITOT 0.5 07/27/2016 0449   GFRNONAA 38 (L) 07/29/2016 0432   GFRAA 44 (L) 07/29/2016 0432   No results found for: CHOL, HDL, LDLCALC, LDLDIRECT, TRIG, CHOLHDL Lab Results  Component Value Date   HGBA1C 8.6 (H) 09/15/2015   No results found for: [id number] Lab Results  Component Value Date   TSH 1.957 07/26/2016  10/13/11 CT  head [I reviewed images myself and agree with interpretation. -VRP]  1. No acute intracranial or calvarial findings. 2. Left frontal scalp soft tissue swelling.  12/11/13 TTE - Normal LV size with moderate LV hypertrophy. EF 55% with septal-lateral dyssynchrony probably from pacing and apicalhypokinesis. Mildly dilated RV wtih mildly decreased systolicfunction.  08/25/15 CT head [I reviewed images myself and agree with interpretation. -VRP]  1. Moderate cortical atrophy, essentially unchanged when compared to the CT scan from 10/13/2011. 2. Moderate hypodense changes in the white matter of both hemispheres consistent with chronic microvascular ischemic change, essentially unchanged when compared to the 2013 CT. 3. There are no acute findings.    ASSESSMENT AND PLAN  83 y.o. year old male here with gradual onset progressive balance and gait difficulty, short steps, shuffling gait, masked facies, hoarse voice, word finding difficulties, short-term memory loss, decreased cognitive ability and generalized weakness. Signs and symptoms are suspicious for neurodegenerative process such as Parkinson's disease. Patient also has signs of diabetic neuropathy may be contributed factors well.    Ddx: parkinsonism (idiopathic vs vascular) + diabetic neuropathy  1. Parkinson's disease ([place])   2. Gait difficulty   3. Diabetic polyneuropathy associated with type 2 diabetes mellitus (HCC)      PLAN:  I spent 15 minutes of face to face time with patient. Greater than 50% of time was spent in counseling and coordination of care with patient. In summary we discussed:   PARKINSON'S DISEASE (established problem, stable) - use rollator walker or cane as needed - continue physical therapy exercises - continue carbidopa-levodopa - encouraged mild stretching and physical activity  DIABETIC NEUROPATHY (established problem, stable) - continue diabetes control per PCP - continue balance  exercises  Meds ordered this encounter  Medications  . carbidopa-levodopa (SINEMET IR) 25-100 MG tablet    Sig: Take 1 tablet by mouth 3 (three) times daily.    Dispense:  270 tablet    Refill:  4   Return in about 9 months (around 09/30/2017).    [name], MD 1/77/1165, 7:90 AM Certified in Neurology, Neurophysiology and Neuroimaging  [facility] [address], [place],  [address] [phone]

## 2017-01-03 DIAGNOSIS — E559 Vitamin D deficiency, unspecified: Secondary | ICD-10-CM | POA: Diagnosis not present

## 2017-01-03 DIAGNOSIS — E782 Mixed hyperlipidemia: Secondary | ICD-10-CM | POA: Diagnosis not present

## 2017-01-03 DIAGNOSIS — E1165 Type 2 diabetes mellitus with hyperglycemia: Secondary | ICD-10-CM | POA: Diagnosis not present

## 2017-01-04 DIAGNOSIS — I2510 Atherosclerotic heart disease of native coronary artery without angina pectoris: Secondary | ICD-10-CM | POA: Diagnosis not present

## 2017-01-04 DIAGNOSIS — C61 Malignant neoplasm of prostate: Secondary | ICD-10-CM | POA: Diagnosis not present

## 2017-01-04 DIAGNOSIS — E782 Mixed hyperlipidemia: Secondary | ICD-10-CM | POA: Diagnosis not present

## 2017-01-04 DIAGNOSIS — E1122 Type 2 diabetes mellitus with diabetic chronic kidney disease: Secondary | ICD-10-CM | POA: Diagnosis not present

## 2017-01-04 DIAGNOSIS — E1159 Type 2 diabetes mellitus with other circulatory complications: Secondary | ICD-10-CM | POA: Diagnosis not present

## 2017-01-04 DIAGNOSIS — E559 Vitamin D deficiency, unspecified: Secondary | ICD-10-CM | POA: Diagnosis not present

## 2017-01-04 DIAGNOSIS — N183 Chronic kidney disease, stage 3 (moderate): Secondary | ICD-10-CM | POA: Diagnosis not present

## 2017-01-04 DIAGNOSIS — E1165 Type 2 diabetes mellitus with hyperglycemia: Secondary | ICD-10-CM | POA: Diagnosis not present

## 2017-01-05 NOTE — Telephone Encounter (Signed)
01/05/2017 Received fax medical records from [facility] for upcoming appointment with Dr. Sallyanne Kuster on 01/12/2017 @ 8:40 am. Records given to [facility]. cbr

## 2017-01-06 ENCOUNTER — Ambulatory Visit (INDEPENDENT_AMBULATORY_CARE_PROVIDER_SITE_OTHER): Payer: Medicare HMO | Admitting: *Deleted

## 2017-01-06 DIAGNOSIS — I442 Atrioventricular block, complete: Secondary | ICD-10-CM | POA: Diagnosis not present

## 2017-01-06 DIAGNOSIS — H04123 Dry eye syndrome of bilateral lacrimal glands: Secondary | ICD-10-CM | POA: Diagnosis not present

## 2017-01-06 DIAGNOSIS — H353132 Nonexudative age-related macular degeneration, bilateral, intermediate dry stage: Secondary | ICD-10-CM | POA: Diagnosis not present

## 2017-01-06 DIAGNOSIS — H5211 Myopia, right eye: Secondary | ICD-10-CM | POA: Diagnosis not present

## 2017-01-06 NOTE — Progress Notes (Signed)
Remote pacemaker transmission.   

## 2017-01-12 ENCOUNTER — Telehealth: Payer: Self-pay | Admitting: Cardiovascular Disease

## 2017-01-12 ENCOUNTER — Encounter: Payer: Self-pay | Admitting: Cardiovascular Disease

## 2017-01-12 ENCOUNTER — Ambulatory Visit (INDEPENDENT_AMBULATORY_CARE_PROVIDER_SITE_OTHER): Payer: Medicare HMO | Admitting: Cardiovascular Disease

## 2017-01-12 VITALS — BP 110/56 | HR 65 | Ht 68.0 in | Wt 186.0 lb

## 2017-01-12 DIAGNOSIS — E1142 Type 2 diabetes mellitus with diabetic polyneuropathy: Secondary | ICD-10-CM | POA: Diagnosis not present

## 2017-01-12 DIAGNOSIS — E785 Hyperlipidemia, unspecified: Secondary | ICD-10-CM

## 2017-01-12 DIAGNOSIS — I5032 Chronic diastolic (congestive) heart failure: Secondary | ICD-10-CM

## 2017-01-12 DIAGNOSIS — I25118 Atherosclerotic heart disease of native coronary artery with other forms of angina pectoris: Secondary | ICD-10-CM | POA: Diagnosis not present

## 2017-01-12 DIAGNOSIS — I472 Ventricular tachycardia, unspecified: Secondary | ICD-10-CM

## 2017-01-12 DIAGNOSIS — I442 Atrioventricular block, complete: Secondary | ICD-10-CM | POA: Diagnosis not present

## 2017-01-12 DIAGNOSIS — Z45010 Encounter for checking and testing of cardiac pacemaker pulse generator [battery]: Secondary | ICD-10-CM | POA: Diagnosis not present

## 2017-01-12 DIAGNOSIS — I4819 Other persistent atrial fibrillation: Secondary | ICD-10-CM

## 2017-01-12 DIAGNOSIS — I4729 Other ventricular tachycardia: Secondary | ICD-10-CM

## 2017-01-12 DIAGNOSIS — I481 Persistent atrial fibrillation: Secondary | ICD-10-CM | POA: Diagnosis not present

## 2017-01-12 DIAGNOSIS — Z7901 Long term (current) use of anticoagulants: Secondary | ICD-10-CM | POA: Diagnosis not present

## 2017-01-12 NOTE — Progress Notes (Signed)
Patient ID: [name] Powell, male   DOB: [birth year], 83 y.o.   MRN: [id number]    Cardiology Office Note    Date:  01/12/2017   ID:  Danis, [place] [birth year], MRN [id number]  PCP:  [name], MD  Cardiologist:   [name], MD   Chief Complaint  Patient presents with  . Follow-up    History of Present Illness:  [name] Powell is a 83 y.o. male with CAD s/p CABG, sinus arrest and complete heart block (pacemaker dependent), now in long-term persistent atrial fibrillation, chronic diastolic heart failure, Parkinson's disease. He has a dual-chamber permanent pacemaker implanted in 2008 (Medtronic Adapta) and is pacemaker dependent.  He has achieved a new level of stability with Parkinson's disease and has not had any serious injuries or falls area he has noticed fatigue  Interrogation of his dual-chamber permanent pacemaker (Medtronic Adapta implanted 2008, estimated generator longevity 20 months) shows ongoing persistent atrial fibrillation and ventricular paced rhythm. He has not had any break in the atrial fibrillation and at least the last 6 months. Suspect he has had persistent atrial fibrillation for much longer period of time, but the arrhythmia was underestimated because of under sensing of fibrillatory waves. 95% ventricular pacing. He has only had one episode of high ventricular rate and that was a long time ago: last September.  14 years have passed since his CABG surgery. His last nuclear study in August 2015 was low risk (small apical scar, no ischemia, EF 44%). Echo August 2015 confirmed apical akinesis, but EF was estimated at 55%. He has complete heart block and sinus node arrest and is pacemaker dependent (>80% A paced, 100% V paced).  His dual-chamber device (Medtronic) was implanted in August 2008. He had 20-beat nonsustained VT recorded in September 2015. 30 minute episode of paroxysmal atrial tach in November 2015. In January 2016 he developed persistent  atrial flutter and this appeared to trigger worsened CHF. Attempted overdrive pacing of the arrhythmia failed. He is very sensitive to programming changes. He feels better at lower rate limit programmed at 60 rather than 70 bpm. He has treated coronary risk factors (hypertension, hyperlipidemia, diabetes mellitus type 2 on oral antidiabetics).   Past Medical History:  Diagnosis Date  . Atrial fibrillation ([place])   . Cancer ([place])   . CHB (complete heart block) ([place])   . Coronary artery disease   . Diabetes mellitus   . Dyslipidemia   . H/O prostate cancer   . Hypertension   . New onset atrial flutter, persistent 07/04/2014  . Pacemaker 12/12/2006   Medtronic adapta  . Pleural effusion, left   . Pneumonia 09/2015  . Prostate cancer ([place])   . 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 Ventricular tachycardia (paroxysmal) ([place]) 03/28/2014    Past Surgical History:  Procedure Laterality Date  . CORONARY ARTERY BYPASS GRAFT  09/04/2001   LIMA to LAD,SVG to diagonal,SVG to ramus intermedius,SVG to PDA  . NM MYOVIEW LTD  01/09/2010   no ischemia  . PACEMAKER INSERTION  12/12/2006   Medtronic adapta  . PROSTATE SURGERY  2001   cancer  . TONSILLECTOMY      Current Medications: Outpatient Medications Prior to Visit  Medication Sig Dispense Refill  . atorvastatin (LIPITOR) 20 MG tablet Take 20 mg by mouth daily.    . benzonatate (TESSALON) 100 MG capsule Take 100 mg by mouth 3 (three) times daily as  needed for cough.  0  . carbidopa-levodopa (SINEMET IR) 25-100 MG tablet Take 1 tablet by mouth 3 (three) times daily. 270 tablet 4  . Cholecalciferol (VITAMIN D PO) Take 10,000 Units by mouth every Friday. On Friday     . ELIQUIS 5 MG TABS tablet TAKE 1 TABLET TWICE DAILY 180 tablet 0  . furosemide (LASIX) 40 MG tablet Take 1 tablet (40 mg total) by mouth daily. 30 tablet 0  . glimepiride (AMARYL) 4 MG tablet Take 4 mg by mouth daily before  breakfast.    . JANUVIA 50 MG tablet Take 1 tablet by mouth every evening.     . metFORMIN (GLUCOPHAGE-XR) 500 MG 24 hr tablet Take 500 mg by mouth daily with breakfast.     . metoprolol (LOPRESSOR) 50 MG tablet TAKE 2 TABLETS IN THE MORNING  AND TAKE 1 TABLET IN THE EVENING 270 tablet 1  . mometasone (ELOCON) 0.1 % cream APPLY TO LEGS AS NEEDED FOR FOOT DERMATITIS  0  . Multiple Vitamin (MULTIVITAMIN WITH MINERALS) TABS tablet Take 1 tablet by mouth daily.    . Multiple Vitamins-Minerals (PRESERVISION AREDS) CAPS Take 1 capsule by mouth 2 (two) times daily.    . polyethylene glycol (MIRALAX / GLYCOLAX) packet Take 17 g by mouth daily. To prevent constipation     . valsartan (DIOVAN) 160 MG tablet TAKE 1 TABLET EVERY DAY 90 tablet 2  . potassium chloride (K-DUR) 10 MEQ tablet TAKE 1 TABLET (10 MEQ TOTAL) BY MOUTH DAILY ON TUESDAY THURSDAY SATURDAY AND SUNDAY (Patient not taking: Reported on 01/12/2017) 52 tablet 1   No facility-administered medications prior to visit.      Allergies:   Lanoxin [digoxin] and Mexitil [mexiletine]   Social History   Social History  . Marital status: Married    Spouse name: [name] Powell  . Number of children: 2  . Years of education: 16   Occupational History  .      retired Art gallery manager   Social History Main Topics  . Smoking status: Former Smoker    Types: Cigarettes, Cigars    Quit date: 05/14/1962  . Smokeless tobacco: Never Used  . Alcohol use Yes     Comment: seldom  . Drug use: No  . Sexual activity: Not Asked   Other Topics Concern  . None   Social History Narrative   Lives at home with wife   Caffeine -seldom     Family History:  The patient's family history includes Heart disease in his mother.   ROS:   Please see the history of present illness.    ROS All other systems reviewed and are negative.   PHYSICAL EXAM:   VS:  BP (!) 110/56   Pulse 65   Ht 5\' 8"  (1.727 m)   Wt 186 lb (84.4 kg)   BMI 28.28 kg/m    GEN: Well  nourished, well developed, in no acute distress   General: Alert, oriented x3, no distress Head: no evidence of trauma, PERRL, EOMI, no exophtalmos or lid lag, no myxedema, no xanthelasma; normal ears, nose and oropharynx Neck: normal jugular venous pulsations and no hepatojugular reflux; brisk carotid pulses without delay and no carotid bruits Chest: clear to auscultation, no signs of consolidation by percussion or palpation, normal fremitus, symmetrical and full respiratory excursions Cardiovascular: normal position and quality of the apical impulse, regular rhythm, normal first and Paradoxically split second heart sounds, no murmurs, rubs or gallops. Healthy left subclavian pacemaker site Abdomen: no  tenderness or distention, no masses by palpation, no abnormal pulsatility or arterial bruits, normal bowel sounds, no hepatosplenomegaly Extremities: no clubbing, cyanosis or edema; 2+ radial, ulnar and brachial pulses bilaterally; 2+ right femoral, posterior tibial and dorsalis pedis pulses; 2+ left femoral, posterior tibial and dorsalis pedis pulses; no subclavian or femoral bruits Neurological: grossly nonfocal, slightly hesitant and shuffling gait consistent with parkinsonism  Psych: euthymic mood, full affect  Wt Readings from Last 3 Encounters:  01/12/17 186 lb (84.4 kg)  12/31/16 183 lb (83 kg)  07/29/16 182 lb 14.4 oz (83 kg)      Studies/Labs Reviewed:   EKG:  EKG is ordered today.  The ekg ordered today demonstrates Atrial ventricular sequential pacing, one PAC  Recent Labs: 07/26/2016: B Natriuretic Peptide 502.5; TSH 1.957 07/27/2016: ALT 27 07/29/2016: BUN 38; Creatinine, Ser 1.51; Hemoglobin 9.8; Platelets 158; Potassium 4.1; Sodium 138  Most recent labs from [facility], 01/03/2017 Total cholesterol 106, triglycerides 147, LDL 57, HDL 26, glucose 141, hemoglobin A1c 7.6%, creatinine 1.49 potassium 4.6, normal liver function tests  ASSESSMENT:    1. CHB (complete heart  block) (HCC)   2. Pacemaker battery depletion   3. Ventricular tachycardia (paroxysmal) (HCC)   4. Persistent atrial fibrillation ([place])   5. Long term current use of anticoagulant   6. Coronary artery disease involving native coronary artery of native heart with other form of angina pectoris ([place])   7. Chronic diastolic congestive heart failure ([place])   8. Dyslipidemia   9. Type 2 diabetes mellitus with diabetic polyneuropathy, without long-term current use of insulin (HCC)      PLAN:  In order of problems listed above:  1. CHB:  Pacemaker dependent (at least intermittently) 2. PPM@ERI : Normal pacemaker function in lead parameters that lasts device check, but reached ERI on July 17. We'll schedule for generator changeout. This procedure has been fully reviewed with the patient and written informed consent has been obtained.He is very sensitive to programming changes. He feels better at lower rate limit programmed at 60 rather than 70 bpm. Long-term persistent atrial fibrillation, programmed VVIR. 3. NSVT: None detected since his last pacemaker change out, but cannot read memory with device at [facility]. Episodes of ventricular tachycardia are very infrequent and asymptomatic. He is on beta blocker therapy, which should not be increased further since he has problems with orthostatic hypotension. Previous treatment with mexiletine was not tolerated. 4. AFib: Probably in permanent atrial fibrillation now. He should continue anticoagulation as long as this is safe. CHADSVasc 6 (age 52, CAD, CHF, HTN, DM). 5. Eliquis: Well-tolerated, no bleeding or recent falls. Hold 24 hours before planned pacemaker generator changeout. 6. CAD s/p CABG: Asymptomatic. Relatively recent low risk nuclear stress test without ischemia. On statin, beta blocker, not taking aspirin due to concomitant anticoagulation 7. CHF: Most recent echocardiogram shows left ventricular ejection fraction 55% with pacing-induced dyssynchrony.  Clinically appears euvolemic. Very sedentary, so hard to assess functional status . Orthostatic hypotension has not been a complaint since we reduced his diuretic dose. 8. HLP on statin. Excellent recent lipid profile. Low HDL is a chronic refractory problem. 9. DM: Acceptable glycemic control with A1c 7.6%   Medication Adjustments/Labs and Tests Ordered: Current medicines are reviewed at length with the patient today.  Concerns regarding medicines are outlined above.  Medication changes, Labs and Tests ordered today are listed in the Patient Instructions below. Patient Instructions  Dr Sallyanne Kuster recommends that you have your pacemaker generator changed.    Signed, Tametria Aho,  MD  01/12/2017 9:54 AM    [facility] [place], [place], [place]  [address] Phone: [phone]; Fax: [phone]

## 2017-01-12 NOTE — Patient Instructions (Signed)
Dr Sallyanne Kuster recommends that you have your pacemaker generator changed.

## 2017-01-13 DIAGNOSIS — D0439 Carcinoma in situ of skin of other parts of face: Secondary | ICD-10-CM | POA: Diagnosis not present

## 2017-01-18 ENCOUNTER — Other Ambulatory Visit: Payer: Self-pay | Admitting: Cardiovascular Disease

## 2017-01-18 ENCOUNTER — Encounter: Payer: Self-pay | Admitting: Cardiology

## 2017-01-19 ENCOUNTER — Other Ambulatory Visit: Payer: Self-pay | Admitting: Cardiovascular Disease

## 2017-01-20 ENCOUNTER — Other Ambulatory Visit: Payer: Self-pay | Admitting: Cardiovascular Disease

## 2017-01-20 NOTE — Telephone Encounter (Signed)
Rx(s) sent to pharmacy electronically.  

## 2017-01-24 ENCOUNTER — Telehealth: Payer: Self-pay | Admitting: Cardiovascular Disease

## 2017-01-24 NOTE — Telephone Encounter (Signed)
New message      Pt is due to have a pacemaker changeout on 9-26.  Last night he began having left arm pit pain with a pulsating feeling.  Please advise

## 2017-01-24 NOTE — Telephone Encounter (Signed)
Spoke with patient's wife who states that her husband felt a sharp pain under his arm last night around midnight with pulsations. She states that he subsided and then returned early this morning. She states he is currently not in pain and that his HR is at 65bpm. I advised that this pain was not related to his pacemaker and that if it cam back and continued to bother him that he reach out to his PCP. Patient's wife verbalized understanding. Patient stated that he currently feels fine and will wait to reach out to his PCP.

## 2017-01-25 LAB — CUP PACEART REMOTE DEVICE CHECK
Battery Voltage: 2.36 V
Implantable Lead Implant Date: 20080804
Implantable Lead Location: 753859
Implantable Lead Location: 753860
Implantable Lead Model: 5076
Implantable Pulse Generator Implant Date: 20080804
Lead Channel Impedance Value: 523 Ohm
Lead Channel Impedance Value: 67 Ohm
Lead Channel Setting Pacing Amplitude: 5 V
Lead Channel Setting Pacing Pulse Width: 1 ms
MDC IDC LEAD IMPLANT DT: 20080804
MDC IDC MSMT BATTERY IMPEDANCE: 9322 Ohm
MDC IDC SESS DTM: 20180830151511
MDC IDC SET LEADCHNL RV SENSING SENSITIVITY: 2.8 mV
MDC IDC STAT BRADY RV PERCENT PACED: 91 %

## 2017-01-26 ENCOUNTER — Other Ambulatory Visit: Payer: Self-pay | Admitting: Cardiovascular Disease

## 2017-01-28 DIAGNOSIS — Z950 Presence of cardiac pacemaker: Secondary | ICD-10-CM | POA: Diagnosis not present

## 2017-01-28 DIAGNOSIS — I5032 Chronic diastolic (congestive) heart failure: Secondary | ICD-10-CM | POA: Diagnosis not present

## 2017-01-28 DIAGNOSIS — Z45010 Encounter for checking and testing of cardiac pacemaker pulse generator [battery]: Secondary | ICD-10-CM | POA: Diagnosis not present

## 2017-01-28 DIAGNOSIS — I481 Persistent atrial fibrillation: Secondary | ICD-10-CM | POA: Diagnosis not present

## 2017-01-29 LAB — PROTIME-INR
INR: 1.1 (ref 0.8–1.2)
PROTHROMBIN TIME: 11.4 s (ref 9.1–12.0)

## 2017-01-29 LAB — CBC
HEMOGLOBIN: 9.8 g/dL — AB (ref 13.0–17.7)
Hematocrit: 30.4 % — ABNORMAL LOW (ref 37.5–51.0)
MCH: 31.1 pg (ref 26.6–33.0)
MCHC: 32.2 g/dL (ref 31.5–35.7)
MCV: 97 fL (ref 79–97)
PLATELETS: 195 10*3/uL (ref 150–379)
RBC: 3.15 x10E6/uL — ABNORMAL LOW (ref 4.14–5.80)
RDW: 16.5 % — ABNORMAL HIGH (ref 12.3–15.4)
WBC: 6.6 10*3/uL (ref 3.4–10.8)

## 2017-01-29 LAB — BASIC METABOLIC PANEL
BUN/Creatinine Ratio: 20 (ref 10–24)
BUN: 33 mg/dL (ref 10–36)
CALCIUM: 8.9 mg/dL (ref 8.6–10.2)
CHLORIDE: 104 mmol/L (ref 96–106)
CO2: 25 mmol/L (ref 20–29)
Creatinine, Ser: 1.65 mg/dL — ABNORMAL HIGH (ref 0.76–1.27)
GFR calc Af Amer: 41 mL/min/{1.73_m2} — ABNORMAL LOW (ref >59)
GFR calc non Af Amer: 36 mL/min/{1.73_m2} — ABNORMAL LOW (ref >59)
Glucose: 153 mg/dL — ABNORMAL HIGH (ref 65–99)
POTASSIUM: 5 mmol/L (ref 3.5–5.2)
Sodium: 145 mmol/L — ABNORMAL HIGH (ref 134–144)

## 2017-02-01 ENCOUNTER — Other Ambulatory Visit: Payer: Self-pay | Admitting: Cardiovascular Disease

## 2017-02-02 ENCOUNTER — Encounter (HOSPITAL_COMMUNITY)
Admission: RE | Disposition: A | Discharge status: Home / Self Care | Payer: Self-pay | Source: Ambulatory Visit | Attending: Cardiovascular Disease

## 2017-02-02 ENCOUNTER — Ambulatory Visit (HOSPITAL_COMMUNITY)
Admission: RE | Admit: 2017-02-02 | Discharge: 2017-02-02 | Disposition: A | Discharge status: Home / Self Care | Payer: Medicare HMO | Source: Ambulatory Visit | Attending: Cardiovascular Disease | Admitting: Cardiovascular Disease

## 2017-02-02 DIAGNOSIS — I2510 Atherosclerotic heart disease of native coronary artery without angina pectoris: Secondary | ICD-10-CM | POA: Diagnosis not present

## 2017-02-02 DIAGNOSIS — Z87891 Personal history of nicotine dependence: Secondary | ICD-10-CM | POA: Diagnosis not present

## 2017-02-02 DIAGNOSIS — Z951 Presence of aortocoronary bypass graft: Secondary | ICD-10-CM | POA: Diagnosis not present

## 2017-02-02 DIAGNOSIS — I481 Persistent atrial fibrillation: Secondary | ICD-10-CM | POA: Diagnosis not present

## 2017-02-02 DIAGNOSIS — E785 Hyperlipidemia, unspecified: Secondary | ICD-10-CM | POA: Insufficient documentation

## 2017-02-02 DIAGNOSIS — Z79899 Other long term (current) drug therapy: Secondary | ICD-10-CM | POA: Insufficient documentation

## 2017-02-02 DIAGNOSIS — Z7901 Long term (current) use of anticoagulants: Secondary | ICD-10-CM | POA: Insufficient documentation

## 2017-02-02 DIAGNOSIS — I442 Atrioventricular block, complete: Secondary | ICD-10-CM | POA: Diagnosis not present

## 2017-02-02 DIAGNOSIS — I110 Hypertensive heart disease with heart failure: Secondary | ICD-10-CM | POA: Diagnosis not present

## 2017-02-02 DIAGNOSIS — E1142 Type 2 diabetes mellitus with diabetic polyneuropathy: Secondary | ICD-10-CM | POA: Insufficient documentation

## 2017-02-02 DIAGNOSIS — I5032 Chronic diastolic (congestive) heart failure: Secondary | ICD-10-CM | POA: Insufficient documentation

## 2017-02-02 DIAGNOSIS — G20 Parkinson's disease: Secondary | ICD-10-CM | POA: Diagnosis not present

## 2017-02-02 DIAGNOSIS — I472 Ventricular tachycardia: Secondary | ICD-10-CM

## 2017-02-02 DIAGNOSIS — I4729 Other ventricular tachycardia: Secondary | ICD-10-CM

## 2017-02-02 DIAGNOSIS — Z45010 Encounter for checking and testing of cardiac pacemaker pulse generator [battery]: Secondary | ICD-10-CM | POA: Insufficient documentation

## 2017-02-02 DIAGNOSIS — Z794 Long term (current) use of insulin: Secondary | ICD-10-CM | POA: Insufficient documentation

## 2017-02-02 DIAGNOSIS — I4891 Unspecified atrial fibrillation: Secondary | ICD-10-CM | POA: Diagnosis present

## 2017-02-02 HISTORY — PX: PPM GENERATOR CHANGEOUT: EP1233

## 2017-02-02 LAB — GLUCOSE, CAPILLARY: GLUCOSE-CAPILLARY: 109 mg/dL — AB (ref 65–99)

## 2017-02-02 LAB — SURGICAL PCR SCREEN
MRSA, PCR: NEGATIVE
STAPHYLOCOCCUS AUREUS: NEGATIVE

## 2017-02-02 SURGERY — PPM GENERATOR CHANGEOUT

## 2017-02-02 MED ORDER — SODIUM CHLORIDE 0.9 % IV SOLN
INTRAVENOUS | Status: DC
  Administered 2017-02-02: 13:00:00 via INTRAVENOUS

## 2017-02-02 MED ORDER — CEFAZOLIN SODIUM-DEXTROSE 2-4 GM/100ML-% IV SOLN
2.0000 g | INTRAVENOUS | Status: AC
  Administered 2017-02-02: 2 g via INTRAVENOUS

## 2017-02-02 MED ORDER — SODIUM CHLORIDE 0.9% FLUSH: 3.0000 mL | INTRAVENOUS | Status: DC | PRN

## 2017-02-02 MED ORDER — BUPIVACAINE HCL (PF) 0.25 % IJ SOLN
INTRAMUSCULAR | Status: DC | PRN
  Administered 2017-02-02: 35 mL

## 2017-02-02 MED ORDER — MUPIROCIN 2 % EX OINT
TOPICAL_OINTMENT | CUTANEOUS | Status: AC
  Administered 2017-02-02: 1
  Filled 2017-02-02: qty 22

## 2017-02-02 MED ORDER — SODIUM CHLORIDE 0.9 % IR SOLN
80.0000 mg | Status: AC
  Administered 2017-02-02: 80 mg

## 2017-02-02 MED ORDER — SODIUM CHLORIDE 0.9 % IV SOLN: 250.0000 mL | INTRAVENOUS | Status: DC | PRN

## 2017-02-02 MED ORDER — SODIUM CHLORIDE 0.9 % IR SOLN
Status: AC
  Filled 2017-02-02: qty 2

## 2017-02-02 MED ORDER — SODIUM CHLORIDE 0.9% FLUSH: 3.0000 mL | Freq: Two times a day (BID) | INTRAVENOUS | Status: DC

## 2017-02-02 MED ORDER — CHLORHEXIDINE GLUCONATE 4 % EX LIQD: 60.0000 mL | Freq: Once | CUTANEOUS | Status: DC

## 2017-02-02 MED ORDER — CEFAZOLIN SODIUM-DEXTROSE 2-4 GM/100ML-% IV SOLN
INTRAVENOUS | Status: AC
  Filled 2017-02-02: qty 100

## 2017-02-02 MED ORDER — ONDANSETRON HCL 4 MG/2ML IJ SOLN: 4.0000 mg | Freq: Four times a day (QID) | INTRAMUSCULAR | Status: DC | PRN

## 2017-02-02 MED ORDER — ACETAMINOPHEN 325 MG PO TABS: 325.0000 mg | ORAL_TABLET | ORAL | Status: DC | PRN

## 2017-02-02 SURGICAL SUPPLY — 5 items
CABLE SURGICAL S-101-97-12 (CABLE) ×3 IMPLANT
IPG PACE AZUR XT DR MRI W1DR01 (Pacemaker) ×1 IMPLANT
PACE AZURE XT DR MRI W1DR01 (Pacemaker) ×3 IMPLANT
PAD DEFIB LIFELINK (PAD) ×3 IMPLANT
TRAY PACEMAKER INSERTION (PACKS) ×3 IMPLANT

## 2017-02-02 NOTE — Op Note (Signed)
Procedure report  Procedure performed:  1. Dual chamber pacemaker generator changeout   Reason for procedure:  1. Device generator at elective replacement interval 2. Complete heart block Procedure performed by:  [name], MD  Complications:  None  Estimated blood loss:  <5 mL  Medications administered during procedure:  Ancef 2 g intravenously, lidocaine 1% 30 mL locally  Device details:   New Generator Medtronic Azure model number [id number], serial number [id number] Right atrial lead (chronic) Medtronic [id number], serial number [id number] (implanted 12/12/2006) Right ventricular lead (chronic)  Medtronic [id number], serial number [id number] (implanted 12/12/2006)  Explanted generator Medtronic Adapta,  model number [id number], serial number  [id number] (implanted 12/12/2006)  Procedure details:  After the risks and benefits of the procedure were discussed the patient provided informed consent. She was brought to the cardiac catheter lab in the fasting state. The patient was prepped and draped in usual sterile fashion. Local anesthesia with 1% lidocaine was administered to to the left infraclavicular area. A 5-6cm horizontal incision was made parallel with and 2-3 cm caudal to the left clavicle, in the area of an old scar. An older scar was seen closer to the left clavicle. Using minimal electrocautery and mostly sharp and blunt dissection the prepectoral pocket was opened carefully to avoid injury to the loops of chronic leads. Extensive dissection was not necessary. The device was explanted. The pocket was carefully inspected for hemostasis and flushed with copious amounts of antibiotic solution.  The leads were disconnected from the old generator and testing of the lead parameters later showed excellent values (permanent atrial fibrillation with very low atrial fib wave voltage). The new generator was connected to the chronic leads, with appropriate pacing noted.   The entire system was then  carefully inserted in the pocket with care been taking that the leads and device assumed a comfortable position without pressure on the incision. Great care was taken that the leads be located deep to the generator. The pocket was then closed in layers using 2 layers of 2-0 Vicryl and cutaneous staples after which a sterile dressing was applied.   At the end of the procedure the following lead parameters were encountered:   Right ventricular lead no sensed R waves, impedance 399 ohms, threshold 0.5 at 0.4 ms pulse width.  [name], MD, [facility] [phone] office [phone] pager

## 2017-02-02 NOTE — Discharge Instructions (Signed)

## 2017-02-02 NOTE — Interval H&P Note (Signed)
History and Physical Interval Note:  02/02/2017 12:10 PM  [name] Powell  has presented today for surgery, with the diagnosis of eri  The various methods of treatment have been discussed with the patient and family. After consideration of risks, benefits and other options for treatment, the patient has consented to  Procedure(s): PPM [place] (N/A) as a surgical intervention .  The patient's history has been reviewed, patient examined, no change in status, stable for surgery.  I have reviewed the patient's chart and labs.  Questions were answered to the patient's satisfaction.     [name] Powell

## 2017-02-02 NOTE — H&P (View-Only) (Signed)
Patient ID: [name] Powell, male   DOB: 04-22-1923, 83 y.o.   MRN: [id number]    Cardiology Office Note    Date:  01/12/2017   ID:  [name] Powell, [name] Powell 1922-06-05, MRN [id number]  PCP:  [name], MD  Cardiologist:   [name], MD   Chief Complaint  Patient presents with  . Follow-up    History of Present Illness:  [name] Powell is a 83 y.o. male with CAD s/p CABG, sinus arrest and complete heart block (pacemaker dependent), now in long-term persistent atrial fibrillation, chronic diastolic heart failure, Parkinson's disease. He has a dual-chamber permanent pacemaker implanted in 2008 (Medtronic Adapta) and is pacemaker dependent.  He has achieved a new level of stability with Parkinson's disease and has not had any serious injuries or falls area he has noticed fatigue  Interrogation of his dual-chamber permanent pacemaker (Medtronic Adapta implanted 2008, estimated generator longevity 20 months) shows ongoing persistent atrial fibrillation and ventricular paced rhythm. He has not had any break in the atrial fibrillation and at least the last 6 months. Suspect he has had persistent atrial fibrillation for much longer period of time, but the arrhythmia was underestimated because of under sensing of fibrillatory waves. 95% ventricular pacing. He has only had one episode of high ventricular rate and that was a long time ago: last September.  14 years have passed since his CABG surgery. His last nuclear study in August 2015 was low risk (small apical scar, no ischemia, EF 44%). Echo August 2015 confirmed apical akinesis, but EF was estimated at 55%. He has complete heart block and sinus node arrest and is pacemaker dependent (>80% A paced, 100% V paced).  His dual-chamber device (Medtronic) was implanted in August 2008. He had 20-beat nonsustained VT recorded in September 2015. 30 minute episode of paroxysmal atrial tach in November 2015. In January 2016 he developed persistent  atrial flutter and this appeared to trigger worsened CHF. Attempted overdrive pacing of the arrhythmia failed. He is very sensitive to programming changes. He feels better at lower rate limit programmed at 60 rather than 70 bpm. He has treated coronary risk factors (hypertension, hyperlipidemia, diabetes mellitus type 2 on oral antidiabetics).   Past Medical History:  Diagnosis Date  . Atrial fibrillation ([place])   . Cancer ([place])   . CHB (complete heart block) ([place])   . Coronary artery disease   . Diabetes mellitus   . Dyslipidemia   . H/O prostate cancer   . Hypertension   . New onset atrial flutter, persistent 07/04/2014  . Pacemaker 12/12/2006   Medtronic adapta  . Pleural effusion, left   . Pneumonia 09/2015  . Prostate cancer ([place])   . 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 Ventricular tachycardia (paroxysmal) ([place]) 03/28/2014    Past Surgical History:  Procedure Laterality Date  . CORONARY ARTERY BYPASS GRAFT  09/04/2001   LIMA to LAD,SVG to diagonal,SVG to ramus intermedius,SVG to PDA  . NM MYOVIEW LTD  01/09/2010   no ischemia  . PACEMAKER INSERTION  12/12/2006   Medtronic adapta  . PROSTATE SURGERY  2001   cancer  . TONSILLECTOMY      Current Medications: Outpatient Medications Prior to Visit  Medication Sig Dispense Refill  . atorvastatin (LIPITOR) 20 MG tablet Take 20 mg by mouth daily.    . benzonatate (TESSALON) 100 MG capsule Take 100 mg by mouth 3 (three) times daily as  needed for cough.  0  . carbidopa-levodopa (SINEMET IR) 25-100 MG tablet Take 1 tablet by mouth 3 (three) times daily. 270 tablet 4  . Cholecalciferol (VITAMIN D PO) Take 10,000 Units by mouth every Friday. On Friday     . ELIQUIS 5 MG TABS tablet TAKE 1 TABLET TWICE DAILY 180 tablet 0  . furosemide (LASIX) 40 MG tablet Take 1 tablet (40 mg total) by mouth daily. 30 tablet 0  . glimepiride (AMARYL) 4 MG tablet Take 4 mg by mouth daily before  breakfast.    . JANUVIA 50 MG tablet Take 1 tablet by mouth every evening.     . metFORMIN (GLUCOPHAGE-XR) 500 MG 24 hr tablet Take 500 mg by mouth daily with breakfast.     . metoprolol (LOPRESSOR) 50 MG tablet TAKE 2 TABLETS IN THE MORNING  AND TAKE 1 TABLET IN THE EVENING 270 tablet 1  . mometasone (ELOCON) 0.1 % cream APPLY TO LEGS AS NEEDED FOR FOOT DERMATITIS  0  . Multiple Vitamin (MULTIVITAMIN WITH MINERALS) TABS tablet Take 1 tablet by mouth daily.    . Multiple Vitamins-Minerals (PRESERVISION AREDS) CAPS Take 1 capsule by mouth 2 (two) times daily.    . polyethylene glycol (MIRALAX / GLYCOLAX) packet Take 17 g by mouth daily. To prevent constipation     . valsartan (DIOVAN) 160 MG tablet TAKE 1 TABLET EVERY DAY 90 tablet 2  . potassium chloride (K-DUR) 10 MEQ tablet TAKE 1 TABLET (10 MEQ TOTAL) BY MOUTH DAILY ON TUESDAY THURSDAY SATURDAY AND SUNDAY (Patient not taking: Reported on 01/12/2017) 52 tablet 1   No facility-administered medications prior to visit.      Allergies:   Lanoxin [digoxin] and Mexitil [mexiletine]   Social History   Social History  . Marital status: Married    Spouse name: Inez Catalina  . Number of children: 2  . Years of education: 16   Occupational History  .      retired Art gallery manager   Social History Main Topics  . Smoking status: Former Smoker    Types: Cigarettes, Cigars    Quit date: 05/14/1962  . Smokeless tobacco: Never Used  . Alcohol use Yes     Comment: seldom  . Drug use: No  . Sexual activity: Not Asked   Other Topics Concern  . None   Social History Narrative   Lives at home with wife   Caffeine -seldom     Family History:  The patient's family history includes Heart disease in his mother.   ROS:   Please see the history of present illness.    ROS All other systems reviewed and are negative.   PHYSICAL EXAM:   VS:  BP (!) 110/56   Pulse 65   Ht 5\' 8"  (1.727 m)   Wt 186 lb (84.4 kg)   BMI 28.28 kg/m    GEN: Well  nourished, well developed, in no acute distress   General: Alert, oriented x3, no distress Head: no evidence of trauma, PERRL, EOMI, no exophtalmos or lid lag, no myxedema, no xanthelasma; normal ears, nose and oropharynx Neck: normal jugular venous pulsations and no hepatojugular reflux; brisk carotid pulses without delay and no carotid bruits Chest: clear to auscultation, no signs of consolidation by percussion or palpation, normal fremitus, symmetrical and full respiratory excursions Cardiovascular: normal position and quality of the apical impulse, regular rhythm, normal first and Paradoxically split second heart sounds, no murmurs, rubs or gallops. Healthy left subclavian pacemaker site Abdomen: no  tenderness or distention, no masses by palpation, no abnormal pulsatility or arterial bruits, normal bowel sounds, no hepatosplenomegaly Extremities: no clubbing, cyanosis or edema; 2+ radial, ulnar and brachial pulses bilaterally; 2+ right femoral, posterior tibial and dorsalis pedis pulses; 2+ left femoral, posterior tibial and dorsalis pedis pulses; no subclavian or femoral bruits Neurological: grossly nonfocal, slightly hesitant and shuffling gait consistent with parkinsonism  Psych: euthymic mood, full affect  Wt Readings from Last 3 Encounters:  01/12/17 186 lb (84.4 kg)  12/31/16 183 lb (83 kg)  07/29/16 182 lb 14.4 oz (83 kg)      Studies/Labs Reviewed:   EKG:  EKG is ordered today.  The ekg ordered today demonstrates Atrial ventricular sequential pacing, one PAC  Recent Labs: 07/26/2016: B Natriuretic Peptide 502.5; TSH 1.957 07/27/2016: ALT 27 07/29/2016: BUN 38; Creatinine, Ser 1.51; Hemoglobin 9.8; Platelets 158; Potassium 4.1; Sodium 138  Most recent labs from [place], 01/03/2017 Total cholesterol 106, triglycerides 147, LDL 57, HDL 26, glucose 141, hemoglobin A1c 7.6%, creatinine 1.49 potassium 4.6, normal liver function tests  ASSESSMENT:    1. CHB (complete heart  block) (H[facility])   2. Pacemaker battery depletion   3. Ventricular tachycardia (paroxysmal) (H[facility])   4. Persistent atrial fibrillation ([place])   5. Long term current use of anticoagulant   6. Coronary artery disease involving native coronary artery of native heart with other form of angina pectoris ([place])   7. Chronic diastolic congestive heart failure ([place])   8. Dyslipidemia   9. Type 2 diabetes mellitus with diabetic polyneuropathy, without long-term current use of insulin (HCC)      PLAN:  In order of problems listed above:  1. CHB:  Pacemaker dependent (at least intermittently) 2. PPM@ERI : Normal pacemaker function in lead parameters that lasts device check, but reached ERI on July 17. We'll schedule for generator changeout. This procedure has been fully reviewed with the patient and written informed consent has been obtained.He is very sensitive to programming changes. He feels better at lower rate limit programmed at 60 rather than 70 bpm. Long-term persistent atrial fibrillation, programmed VVIR. 3. NSVT: None detected since his last pacemaker change out, but cannot read memory with device at [facility]. Episodes of ventricular tachycardia are very infrequent and asymptomatic. He is on beta blocker therapy, which should not be increased further since he has problems with orthostatic hypotension. Previous treatment with mexiletine was not tolerated. 4. AFib: Probably in permanent atrial fibrillation now. He should continue anticoagulation as long as this is safe. CHADSVasc 6 (age 79, CAD, CHF, HTN, DM). 5. Eliquis: Well-tolerated, no bleeding or recent falls. Hold 24 hours before planned pacemaker generator changeout. 6. CAD s/p CABG: Asymptomatic. Relatively recent low risk nuclear stress test without ischemia. On statin, beta blocker, not taking aspirin due to concomitant anticoagulation 7. CHF: Most recent echocardiogram shows left ventricular ejection fraction 55% with pacing-induced dyssynchrony.  Clinically appears euvolemic. Very sedentary, so hard to assess functional status . Orthostatic hypotension has not been a complaint since we reduced his diuretic dose. 8. HLP on statin. Excellent recent lipid profile. Low HDL is a chronic refractory problem. 9. DM: Acceptable glycemic control with A1c 7.6%   Medication Adjustments/Labs and Tests Ordered: Current medicines are reviewed at length with the patient today.  Concerns regarding medicines are outlined above.  Medication changes, Labs and Tests ordered today are listed in the Patient Instructions below. Patient Instructions  Dr Sallyanne Kuster recommends that you have your pacemaker generator changed.    Signed, Lavone Weisel,  MD  01/12/2017 9:54 AM    [facility] [address], [place], [place]  [address] Phone: [phone]; Fax: [phone]

## 2017-02-03 ENCOUNTER — Telehealth: Payer: Self-pay | Admitting: Cardiovascular Disease

## 2017-02-03 ENCOUNTER — Encounter (HOSPITAL_COMMUNITY): Payer: Self-pay | Admitting: Cardiovascular Disease

## 2017-02-03 NOTE — Telephone Encounter (Signed)
Closed encounter °

## 2017-02-21 ENCOUNTER — Ambulatory Visit (INDEPENDENT_AMBULATORY_CARE_PROVIDER_SITE_OTHER): Payer: Medicare HMO | Admitting: *Deleted

## 2017-02-21 DIAGNOSIS — I481 Persistent atrial fibrillation: Secondary | ICD-10-CM | POA: Diagnosis not present

## 2017-02-21 DIAGNOSIS — I4819 Other persistent atrial fibrillation: Secondary | ICD-10-CM

## 2017-02-21 DIAGNOSIS — I442 Atrioventricular block, complete: Secondary | ICD-10-CM

## 2017-02-21 LAB — CUP PACEART INCLINIC DEVICE CHECK
Battery Remaining Longevity: 139 mo
Battery Voltage: 3.21 V
Brady Statistic AP VP Percent: 0 %
Brady Statistic AP VS Percent: 0 %
Brady Statistic AS VP Percent: 95.53 %
Brady Statistic AS VS Percent: 4.47 %
Brady Statistic RA Percent Paced: 0 %
Brady Statistic RV Percent Paced: 95.53 %
Date Time Interrogation Session: 20181015154157
Implantable Lead Implant Date: 20080804
Implantable Lead Implant Date: 20080804
Implantable Lead Location: 753859
Implantable Lead Location: 753860
Implantable Lead Model: 5076
Implantable Lead Model: 5076
Implantable Pulse Generator Implant Date: 20180926
Lead Channel Impedance Value: 323 Ohm
Lead Channel Impedance Value: 342 Ohm
Lead Channel Impedance Value: 361 Ohm
Lead Channel Impedance Value: 418 Ohm
Lead Channel Pacing Threshold Amplitude: 0.75 V
Lead Channel Pacing Threshold Pulse Width: 0.4 ms
Lead Channel Setting Pacing Amplitude: 2.5 V
Lead Channel Setting Pacing Pulse Width: 0.4 ms
Lead Channel Setting Sensing Sensitivity: 4 mV

## 2017-02-21 NOTE — Progress Notes (Signed)
Wound check appointment s/p generator replacement 02/02/17. Staples removed. Wound without redness or edema. Incision edges approximated, wound well healed. Normal device function. Thresholds, sensing, and impedances consistent with implant measurements. Histogram distribution appropriate for patient and level of activity. No high ventricular rates noted. Patient educated about wound care, arm mobility and carelink monitoring. ROV with [facility] 05/18/17.

## 2017-03-02 DIAGNOSIS — Z23 Encounter for immunization: Secondary | ICD-10-CM | POA: Diagnosis not present

## 2017-03-03 ENCOUNTER — Telehealth: Payer: Self-pay | Admitting: Cardiovascular Disease

## 2017-03-03 NOTE — Telephone Encounter (Signed)
Spoke with patient's wife about med recall. She got a letter dated for early Sept regarding valsartan recall. [place] to inquire about recall and was notified that patient's manufacturer is OK to take. Patient's wife called and made aware.

## 2017-03-03 NOTE — Telephone Encounter (Signed)
°  New Prob  Pt c/o medication issue:  1. Name of Medication: Valsartan  2. How are you currently taking this medication (dosage and times per day)? 150 mg once daily  3. Are you having a reaction (difficulty breathing--STAT)? No  4. What is your medication issue? Calling regarding recall

## 2017-03-04 DIAGNOSIS — N183 Chronic kidney disease, stage 3 (moderate): Secondary | ICD-10-CM | POA: Diagnosis not present

## 2017-03-04 DIAGNOSIS — R413 Other amnesia: Secondary | ICD-10-CM | POA: Diagnosis not present

## 2017-03-04 DIAGNOSIS — G20 Parkinson's disease: Secondary | ICD-10-CM | POA: Diagnosis not present

## 2017-03-04 DIAGNOSIS — E119 Type 2 diabetes mellitus without complications: Secondary | ICD-10-CM | POA: Diagnosis not present

## 2017-03-04 DIAGNOSIS — C61 Malignant neoplasm of prostate: Secondary | ICD-10-CM | POA: Diagnosis not present

## 2017-03-04 DIAGNOSIS — E782 Mixed hyperlipidemia: Secondary | ICD-10-CM | POA: Diagnosis not present

## 2017-03-04 DIAGNOSIS — E559 Vitamin D deficiency, unspecified: Secondary | ICD-10-CM | POA: Diagnosis not present

## 2017-03-04 DIAGNOSIS — R2681 Unsteadiness on feet: Secondary | ICD-10-CM | POA: Diagnosis not present

## 2017-03-04 DIAGNOSIS — D649 Anemia, unspecified: Secondary | ICD-10-CM | POA: Diagnosis not present

## 2017-03-09 DIAGNOSIS — Z961 Presence of intraocular lens: Secondary | ICD-10-CM | POA: Diagnosis not present

## 2017-03-09 DIAGNOSIS — H0015 Chalazion left lower eyelid: Secondary | ICD-10-CM | POA: Diagnosis not present

## 2017-03-11 DIAGNOSIS — H0015 Chalazion left lower eyelid: Secondary | ICD-10-CM | POA: Diagnosis not present

## 2017-03-21 DIAGNOSIS — H0015 Chalazion left lower eyelid: Secondary | ICD-10-CM | POA: Diagnosis not present

## 2017-03-21 DIAGNOSIS — L03211 Cellulitis of face: Secondary | ICD-10-CM | POA: Diagnosis not present

## 2017-03-24 ENCOUNTER — Other Ambulatory Visit: Payer: Self-pay | Admitting: Cardiovascular Disease

## 2017-03-24 MED ORDER — APIXABAN 2.5 MG PO TABS: 2.5000 mg | ORAL_TABLET | Freq: Two times a day (BID) | ORAL | 1 refills | Status: DC

## 2017-03-24 NOTE — Telephone Encounter (Signed)
Scr > 1.5 since 07/2016 (last Scr = 1.65) Age 83  Will change dose to Eliquis 2.5mg  twice daily   Talked to Mrs Couper to informed dose change

## 2017-04-04 DIAGNOSIS — E782 Mixed hyperlipidemia: Secondary | ICD-10-CM | POA: Diagnosis not present

## 2017-04-04 DIAGNOSIS — E559 Vitamin D deficiency, unspecified: Secondary | ICD-10-CM | POA: Diagnosis not present

## 2017-04-04 DIAGNOSIS — E1165 Type 2 diabetes mellitus with hyperglycemia: Secondary | ICD-10-CM | POA: Diagnosis not present

## 2017-04-05 DIAGNOSIS — I2510 Atherosclerotic heart disease of native coronary artery without angina pectoris: Secondary | ICD-10-CM | POA: Diagnosis not present

## 2017-04-05 DIAGNOSIS — E1159 Type 2 diabetes mellitus with other circulatory complications: Secondary | ICD-10-CM | POA: Diagnosis not present

## 2017-04-05 DIAGNOSIS — E1122 Type 2 diabetes mellitus with diabetic chronic kidney disease: Secondary | ICD-10-CM | POA: Diagnosis not present

## 2017-04-05 DIAGNOSIS — N183 Chronic kidney disease, stage 3 (moderate): Secondary | ICD-10-CM | POA: Diagnosis not present

## 2017-04-05 DIAGNOSIS — E559 Vitamin D deficiency, unspecified: Secondary | ICD-10-CM | POA: Diagnosis not present

## 2017-04-05 DIAGNOSIS — E782 Mixed hyperlipidemia: Secondary | ICD-10-CM | POA: Diagnosis not present

## 2017-04-05 DIAGNOSIS — E1165 Type 2 diabetes mellitus with hyperglycemia: Secondary | ICD-10-CM | POA: Diagnosis not present

## 2017-04-05 DIAGNOSIS — C61 Malignant neoplasm of prostate: Secondary | ICD-10-CM | POA: Diagnosis not present

## 2017-04-05 DIAGNOSIS — E663 Overweight: Secondary | ICD-10-CM | POA: Diagnosis not present

## 2017-04-07 ENCOUNTER — Telehealth: Payer: Self-pay | Admitting: Cardiology

## 2017-04-07 ENCOUNTER — Ambulatory Visit (INDEPENDENT_AMBULATORY_CARE_PROVIDER_SITE_OTHER): Payer: Medicare HMO | Admitting: *Deleted

## 2017-04-07 DIAGNOSIS — I442 Atrioventricular block, complete: Secondary | ICD-10-CM | POA: Diagnosis not present

## 2017-04-07 NOTE — Telephone Encounter (Signed)
Spoke with pt and reminded pt of remote transmission that is due today. Pt verbalized understanding.   

## 2017-04-08 ENCOUNTER — Encounter: Payer: Self-pay | Admitting: Cardiology

## 2017-04-08 NOTE — Progress Notes (Signed)
Remote pacemaker transmission.   

## 2017-04-15 ENCOUNTER — Other Ambulatory Visit: Payer: Self-pay | Admitting: Cardiovascular Disease

## 2017-04-18 LAB — CUP PACEART REMOTE DEVICE CHECK
Battery Voltage: 3.19 V
Brady Statistic AP VP Percent: 0 %
Brady Statistic RA Percent Paced: 0 %
Brady Statistic RV Percent Paced: 96.44 %
Implantable Lead Implant Date: 20080804
Implantable Lead Implant Date: 20080804
Implantable Lead Location: 753859
Implantable Lead Model: 5076
Implantable Lead Model: 5076
Implantable Pulse Generator Implant Date: 20180926
Lead Channel Impedance Value: 342 Ohm
Lead Channel Impedance Value: 418 Ohm
Lead Channel Setting Pacing Pulse Width: 0.4 ms
Lead Channel Setting Sensing Sensitivity: 4 mV
MDC IDC LEAD LOCATION: 753860
MDC IDC MSMT BATTERY REMAINING LONGEVITY: 137 mo
MDC IDC MSMT LEADCHNL RV IMPEDANCE VALUE: 323 Ohm
MDC IDC MSMT LEADCHNL RV IMPEDANCE VALUE: 361 Ohm
MDC IDC MSMT LEADCHNL RV PACING THRESHOLD AMPLITUDE: 0.75 V
MDC IDC MSMT LEADCHNL RV PACING THRESHOLD PULSEWIDTH: 0.4 ms
MDC IDC SESS DTM: 20181129153713
MDC IDC SET LEADCHNL RV PACING AMPLITUDE: 2.5 V
MDC IDC STAT BRADY AP VS PERCENT: 0 %
MDC IDC STAT BRADY AS VP PERCENT: 96.44 %
MDC IDC STAT BRADY AS VS PERCENT: 3.56 %

## 2017-05-18 ENCOUNTER — Encounter: Payer: Self-pay | Admitting: Cardiovascular Disease

## 2017-05-18 ENCOUNTER — Ambulatory Visit: Payer: Medicare HMO | Admitting: Cardiovascular Disease

## 2017-05-18 VITALS — BP 136/80 | HR 81 | Ht 68.0 in | Wt 186.6 lb

## 2017-05-18 DIAGNOSIS — I2510 Atherosclerotic heart disease of native coronary artery without angina pectoris: Secondary | ICD-10-CM

## 2017-05-18 DIAGNOSIS — I442 Atrioventricular block, complete: Secondary | ICD-10-CM

## 2017-05-18 DIAGNOSIS — Z950 Presence of cardiac pacemaker: Secondary | ICD-10-CM

## 2017-05-18 DIAGNOSIS — I4729 Other ventricular tachycardia: Secondary | ICD-10-CM

## 2017-05-18 DIAGNOSIS — I472 Ventricular tachycardia, unspecified: Secondary | ICD-10-CM

## 2017-05-18 DIAGNOSIS — I5032 Chronic diastolic (congestive) heart failure: Secondary | ICD-10-CM

## 2017-05-18 DIAGNOSIS — Z7901 Long term (current) use of anticoagulants: Secondary | ICD-10-CM

## 2017-05-18 DIAGNOSIS — N183 Chronic kidney disease, stage 3 unspecified: Secondary | ICD-10-CM

## 2017-05-18 DIAGNOSIS — I481 Persistent atrial fibrillation: Secondary | ICD-10-CM | POA: Diagnosis not present

## 2017-05-18 DIAGNOSIS — I4819 Other persistent atrial fibrillation: Secondary | ICD-10-CM

## 2017-05-18 DIAGNOSIS — E785 Hyperlipidemia, unspecified: Secondary | ICD-10-CM

## 2017-05-18 DIAGNOSIS — E1142 Type 2 diabetes mellitus with diabetic polyneuropathy: Secondary | ICD-10-CM | POA: Diagnosis not present

## 2017-05-18 NOTE — Progress Notes (Signed)
Patient ID: [name] Powell, male   DOB: 03/03/1923, 84 y.o.   MRN: [id number]    Cardiology Office Note    Date:  05/19/2017   ID:  [name] Powell, [name] Powell [birth year], MRN [id number]  PCP:  [name], MD  Cardiologist:   [name], MD   Chief Complaint  Patient presents with  . Follow-up    History of Present Illness:  [name] Powell is a 84 y.o. male with CAD s/p CABG, sinus arrest and complete heart block (pacemaker dependent), now in long-term persistent atrial fibrillation, chronic diastolic heart failure, Parkinson's disease. He has a dual-chamber permanent pacemaker implanted in 2008, generator change out September 2018 (Medtronic Azure) and is pacemaker dependent.  Treatment with carbidopa levodopa is working reasonably well and he has not had serious injuries or any falls.  Fatigue is still present but unchanged.  He remains quite sedentary  The patient specifically denies any chest pain at rest or with exertion, dyspnea at rest or with exertion, orthopnea, paroxysmal nocturnal dyspnea, syncope, palpitations, focal neurological deficits, intermittent claudication, lower extremity edema, unexplained weight gain, cough, hemoptysis or wheezing.  Interrogation of his dual-chamber permanent pacemaker (Medtronic Azure implanted September 2018) shows ongoing persistent atrial fibrillation and 96% ventricular paced rhythm.  He has had a couple of 4-5 beat runs of nonsustained VT, asymptomatic.  There is no ventricular escape rhythm and pacing was taken down at 40 beats a minute.  Almost 16 years have passed since his CABG surgery. His last nuclear study in August 2015 was low risk (small apical scar, no ischemia, EF 44%). Echo August 2015 confirmed apical akinesis, but EF was estimated at 55%. 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  He has been in long-term persistent atrial fibrillation for well over a year. He is very sensitive to  programming changes. He feels better at lower rate limit programmed at 60 rather than 70 bpm. He has treated coronary risk factors (hypertension, hyperlipidemia, diabetes mellitus type 2 on oral antidiabetics).   Past Medical History:  Diagnosis Date  . Atrial fibrillation ([place])   . Cancer ([place])   . CHB (complete heart block) ([place])   . Coronary artery disease   . Diabetes mellitus   . Dyslipidemia   . H/O prostate cancer   . Hypertension   . New onset atrial flutter, persistent 07/04/2014  . Pacemaker 12/12/2006   Medtronic adapta  . Pleural effusion, left   . Pneumonia 09/2015  . Prostate cancer ([place])   . 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 Ventricular tachycardia (paroxysmal) ([place]) 03/28/2014    Past Surgical History:  Procedure Laterality Date  . CORONARY ARTERY BYPASS GRAFT  09/04/2001   LIMA to LAD,SVG to diagonal,SVG to ramus intermedius,SVG to PDA  . NM MYOVIEW LTD  01/09/2010   no ischemia  . PACEMAKER INSERTION  12/12/2006   Medtronic adapta  . PPM GENERATOR CHANGEOUT N/A 02/02/2017   Procedure: PPM GENERATOR CHANGEOUT - DUAL CHAMBER;  Surgeon: [name], MD;  Location: [facility];  Service: Cardiovascular;  Laterality: N/A;  . PROSTATE SURGERY  2001   cancer  . TONSILLECTOMY      Current Medications: Outpatient Medications Prior to Visit  Medication Sig Dispense Refill  . apixaban (ELIQUIS) 2.5 MG TABS tablet Take 1 tablet (2.5 mg total) 2 (two) times daily by mouth. 180 tablet 1  . atorvastatin (LIPITOR)  20 MG tablet Take 20 mg by mouth daily.    . carbidopa-levodopa (SINEMET IR) 25-100 MG tablet Take 1 tablet by mouth 3 (three) times daily. 270 tablet 4  . Cholecalciferol (VITAMIN D PO) Take 10,000 Units by mouth every Friday. On Friday     . furosemide (LASIX) 40 MG tablet Take 40 mg by mouth daily.    Marland Kitchen glimepiride (AMARYL) 4 MG tablet Take 4 mg by mouth daily before breakfast.    . JANUVIA 50  MG tablet Take 1 tablet by mouth every evening.     . metFORMIN (GLUCOPHAGE-XR) 500 MG 24 hr tablet Take 500 mg by mouth daily with breakfast.     . metoprolol tartrate (LOPRESSOR) 50 MG tablet TAKE 2 TABLETS IN THE MORNING  AND TAKE 1 TABLET IN THE EVENING 270 tablet 1  . Multiple Vitamins-Minerals (PRESERVISION AREDS) CAPS Take 1 capsule by mouth 2 (two) times daily.    . polyethylene glycol (MIRALAX / GLYCOLAX) packet Take 17 g by mouth daily. To prevent constipation     . potassium chloride (K-DUR,KLOR-CON) 10 MEQ tablet Take 10 mEq by mouth daily.    . valsartan (DIOVAN) 160 MG tablet Take 1 tablet (160 mg total) by mouth daily. 90 tablet 3  . ELIQUIS 5 MG TABS tablet TAKE 1 TABLET TWICE DAILY (Patient not taking: Reported on 05/18/2017) 180 tablet 0   No facility-administered medications prior to visit.      Allergies:   Lanoxin [digoxin] and Mexitil [mexiletine]   Social History   Socioeconomic History  . Marital status: Married    Spouse name: [name] Powell  . Number of children: 2  . Years of education: 30  . Highest education level: None  Social Needs  . Financial resource strain: None  . Food insecurity - worry: None  . Food insecurity - inability: None  . Transportation needs - medical: None  . Transportation needs - non-medical: None  Occupational History    Comment: retired Art gallery manager  Tobacco Use  . Smoking status: Former Smoker    Types: Cigarettes, Cigars    Last attempt to quit: 05/14/1962    Years since quitting: 55.0  . Smokeless tobacco: Never Used  Substance and Sexual Activity  . Alcohol use: Yes    Comment: seldom  . Drug use: No  . Sexual activity: None  Other Topics Concern  . None  Social History Narrative   Lives at home with wife   Caffeine -seldom     Family History:  The patient's family history includes Heart disease in his mother.   ROS:   Please see the history of present illness.    ROS All other systems reviewed and are  negative.   PHYSICAL EXAM:   VS:  BP 136/80   Pulse 81   Ht 5\' 8"  (1.727 m)   Wt 186 lb 9.6 oz (84.6 kg)   BMI 28.37 kg/m     General: Alert, oriented x3, no distress, mildly overweight.  Facial expressions a little frozen consistent with Parkinson's.  Some shuffling gait. Head: no evidence of trauma, PERRL, EOMI, no exophtalmos or lid lag, no myxedema, no xanthelasma; normal ears, nose and oropharynx Neck: normal jugular venous pulsations and no hepatojugular reflux; brisk carotid pulses without delay and no carotid bruits Chest: clear to auscultation, no signs of consolidation by percussion or palpation, normal fremitus, symmetrical and full respiratory excursions Cardiovascular: normal position and quality of the apical impulse, regular rhythm, normal first and second heart  sounds, no murmurs, rubs or gallops Abdomen: no tenderness or distention, no masses by palpation, no abnormal pulsatility or arterial bruits, normal bowel sounds, no hepatosplenomegaly Extremities: no clubbing, cyanosis or edema; 2+ radial, ulnar and brachial pulses bilaterally; 2+ right femoral, posterior tibial and dorsalis pedis pulses; 2+ left femoral, posterior tibial and dorsalis pedis pulses; no subclavian or femoral bruits Neurological: grossly nonfocal Psych: Normal mood and affect   Wt Readings from Last 3 Encounters:  05/18/17 186 lb 9.6 oz (84.6 kg)  02/02/17 181 lb (82.1 kg)  01/12/17 186 lb (84.4 kg)      Studies/Labs Reviewed:   EKG:  EKG is ordered today.  The ekg ordered today demonstrates atrial fibrillation, complete heart block, ventricular paced rhythm at 80 bpm, QTC 527 ms  Recent Labs: 07/26/2016: B Natriuretic Peptide 502.5; TSH 1.957 07/27/2016: ALT 27 01/28/2017: BUN 33; Creatinine, Ser 1.65; Hemoglobin 9.8; Platelets 195; Potassium 5.0; Sodium 145  Most recent labs from [facility], 01/03/2017 Total cholesterol 106, triglycerides 147, LDL 57, HDL 26, glucose 141, hemoglobin A1c  7.6%, creatinine 1.49 potassium 4.6, normal liver function tests  ASSESSMENT:    1. CHB (complete heart block) ([facility])   2. Pacemaker   3. Ventricular tachycardia (paroxysmal) ([facility])   4. Persistent atrial fibrillation ([place])   5. Long term current use of anticoagulant   6. Coronary artery disease involving native coronary artery of native heart without angina pectoris   7. Chronic diastolic congestive heart failure ([place])   8. Dyslipidemia   9. Type 2 diabetes mellitus with diabetic polyneuropathy, without long-term current use of insulin ([place])   10. CKD (chronic kidney disease), stage III ([place])      PLAN:  In order of problems listed above:  1. CHB:  Pacemaker dependent.  No escape rhythm today 2. PPM: Normal device function.  Although he has complete heart block he feels better when his lower rate limit is set at 60 bpm. 3. NSVT: Infrequent, asymptomatic.  The recently recorded episodes have been very brief, but he has had a 20 beat run in the past. He is on beta blocker therapy, which should not be increased further since he has problems with orthostatic hypotension. Previous treatment with mexiletine was not tolerated. 4. AFib: permanent atrial fibrillation now. He should continue anticoagulation as long as this is safe. CHADSVasc 6 (age 80, CAD, CHF, HTN, DM).  On lower dose Eliquis due to age and renal function. 5. Eliquis: Tolerating anticoagulation without bleeding complications.  He does not have a history of stroke or embolic events. 6. CAD s/p CABG: Asymptomatic, angina free, not receiving aspirin due to concomitant anticoagulation.  On statin. 7. CHF: Appears euvolemic from a clinical standpoint.  Most recent echocardiogram shows left ventricular ejection fraction 60-65% with pacing-induced dyssynchrony. Very sedentary, so hard to assess functional status . Orthostatic hypotension has not been a complaint since we reduced his diuretic dose. 8. HLP on statin. Excellent recent lipid  profile. Low HDL is a chronic refractory problem. 9. DM: Acceptable glycemic control with A1c 7.6% (per his endocrinologist note, target under 8%, not a candidate for aggressive glycemic control) 10. CKD stage 3: On adjusted dose of anticoagulant   Medication Adjustments/Labs and Tests Ordered: Current medicines are reviewed at length with the patient today.  Concerns regarding medicines are outlined above.  Medication changes, Labs and Tests ordered today are listed in the Patient Instructions below. Patient Instructions  Dr Sallyanne Kuster recommends that you continue on your current medications as directed. Please refer  to the Current Medication list given to you today.  Remote monitoring is used to monitor your Pacemaker or ICD from home. This monitoring reduces the number of office visits required to check your device to one time per year. It allows [place] to keep an eye on the functioning of your device to ensure it is working properly. You are scheduled for a device check from home on Thursday, February 28th, 2019. You may send your transmission at any time that day. If you have a wireless device, the transmission will be sent automatically. After your physician reviews your transmission, you will receive a notification with your next transmission date.  Dr Sallyanne Kuster recommends that you schedule a follow-up appointment in 12 months with a pacemaker check. You will receive a reminder letter in the mail two months in advance. If you don't receive a letter, please call our office to schedule the follow-up appointment.  If you need a refill on your cardiac medications before your next appointment, please call your pharmacy.    Signed, [name], MD  05/19/2017 1:06 PM    [facility] [address], [place], [place]  [address] Phone: [phone]; Fax: [phone]

## 2017-05-18 NOTE — Patient Instructions (Signed)

## 2017-05-19 ENCOUNTER — Encounter: Payer: Self-pay | Admitting: Cardiovascular Disease

## 2017-05-23 DIAGNOSIS — C44329 Squamous cell carcinoma of skin of other parts of face: Secondary | ICD-10-CM | POA: Diagnosis not present

## 2017-05-23 DIAGNOSIS — L570 Actinic keratosis: Secondary | ICD-10-CM | POA: Diagnosis not present

## 2017-05-23 DIAGNOSIS — D0439 Carcinoma in situ of skin of other parts of face: Secondary | ICD-10-CM | POA: Diagnosis not present

## 2017-05-23 DIAGNOSIS — D229 Melanocytic nevi, unspecified: Secondary | ICD-10-CM | POA: Diagnosis not present

## 2017-05-25 ENCOUNTER — Other Ambulatory Visit: Payer: Self-pay | Admitting: Cardiovascular Disease

## 2017-05-30 ENCOUNTER — Telehealth: Payer: Self-pay | Admitting: Cardiovascular Disease

## 2017-05-30 NOTE — Telephone Encounter (Signed)
Returned the call to the patient's wife, per the dpr. She stated that her husband's potassium was called in as 10 meq 4 days a week. She stated the patient takes potassium 10 meq daily and has for a year. According to his last visit on 1/9 it should be 10 meq daily. Do not see in epic where this has changed. Message will be routed to the provider for his recommendation on dosage.

## 2017-05-30 NOTE — Telephone Encounter (Signed)
New message    Patient wife calling to confirm how medication should be taken. Should medication be taken DAILY, or only only specified days.  Please call

## 2017-05-30 NOTE — Addendum Note (Signed)
Addended by: Leland Johns A on: 05/30/2017 08:37 AM   Modules accepted: Orders

## 2017-05-31 MED ORDER — POTASSIUM CHLORIDE CRYS ER 10 MEQ PO TBCR: 10.0000 meq | EXTENDED_RELEASE_TABLET | Freq: Every day | ORAL | 3 refills | Status: DC

## 2017-05-31 NOTE — Telephone Encounter (Signed)
Please call in 10 mEq daily MCr

## 2017-05-31 NOTE — Telephone Encounter (Signed)
Patient's wife made aware that Potassium should be daily. New prescription has been sent into [facility].

## 2017-06-14 ENCOUNTER — Other Ambulatory Visit: Payer: Self-pay | Admitting: Cardiovascular Disease

## 2017-06-20 DIAGNOSIS — J069 Acute upper respiratory infection, unspecified: Secondary | ICD-10-CM | POA: Diagnosis not present

## 2017-06-20 LAB — CUP PACEART INCLINIC DEVICE CHECK
Implantable Lead Implant Date: 20080804
Implantable Lead Location: 753860
Implantable Lead Model: 5076
Implantable Pulse Generator Implant Date: 20180926
MDC IDC LEAD IMPLANT DT: 20080804
MDC IDC LEAD LOCATION: 753859
MDC IDC SESS DTM: 20190211172306

## 2017-06-30 DIAGNOSIS — L988 Other specified disorders of the skin and subcutaneous tissue: Secondary | ICD-10-CM | POA: Diagnosis not present

## 2017-06-30 DIAGNOSIS — C61 Malignant neoplasm of prostate: Secondary | ICD-10-CM | POA: Diagnosis not present

## 2017-06-30 DIAGNOSIS — E1165 Type 2 diabetes mellitus with hyperglycemia: Secondary | ICD-10-CM | POA: Diagnosis not present

## 2017-06-30 DIAGNOSIS — E782 Mixed hyperlipidemia: Secondary | ICD-10-CM | POA: Diagnosis not present

## 2017-06-30 DIAGNOSIS — C44329 Squamous cell carcinoma of skin of other parts of face: Secondary | ICD-10-CM | POA: Diagnosis not present

## 2017-06-30 DIAGNOSIS — I2510 Atherosclerotic heart disease of native coronary artery without angina pectoris: Secondary | ICD-10-CM | POA: Diagnosis not present

## 2017-07-05 DIAGNOSIS — N183 Chronic kidney disease, stage 3 (moderate): Secondary | ICD-10-CM | POA: Diagnosis not present

## 2017-07-05 DIAGNOSIS — E1122 Type 2 diabetes mellitus with diabetic chronic kidney disease: Secondary | ICD-10-CM | POA: Diagnosis not present

## 2017-07-05 DIAGNOSIS — E1159 Type 2 diabetes mellitus with other circulatory complications: Secondary | ICD-10-CM | POA: Diagnosis not present

## 2017-07-05 DIAGNOSIS — D649 Anemia, unspecified: Secondary | ICD-10-CM | POA: Diagnosis not present

## 2017-07-05 DIAGNOSIS — C61 Malignant neoplasm of prostate: Secondary | ICD-10-CM | POA: Diagnosis not present

## 2017-07-05 DIAGNOSIS — E1165 Type 2 diabetes mellitus with hyperglycemia: Secondary | ICD-10-CM | POA: Diagnosis not present

## 2017-07-05 DIAGNOSIS — I2510 Atherosclerotic heart disease of native coronary artery without angina pectoris: Secondary | ICD-10-CM | POA: Diagnosis not present

## 2017-07-05 DIAGNOSIS — E782 Mixed hyperlipidemia: Secondary | ICD-10-CM | POA: Diagnosis not present

## 2017-07-05 DIAGNOSIS — E559 Vitamin D deficiency, unspecified: Secondary | ICD-10-CM | POA: Diagnosis not present

## 2017-07-07 ENCOUNTER — Telehealth: Payer: Self-pay | Admitting: Cardiovascular Disease

## 2017-07-07 ENCOUNTER — Ambulatory Visit (INDEPENDENT_AMBULATORY_CARE_PROVIDER_SITE_OTHER): Payer: Medicare HMO | Admitting: *Deleted

## 2017-07-07 DIAGNOSIS — I442 Atrioventricular block, complete: Secondary | ICD-10-CM

## 2017-07-07 NOTE — Progress Notes (Signed)
Remote pacemaker transmission.   

## 2017-07-07 NOTE — Telephone Encounter (Signed)
New Message    1. Has your device fired? No   2. Is you device beeping? No   3. Are you experiencing draining or swelling at device site? no  4. Are you calling to see if we received your device transmission? yes  5. Have you passed out? No   Patients wife is calling to see if transmission received     Please route to [place]

## 2017-07-07 NOTE — Telephone Encounter (Signed)
Informed pts wife that transmission was received.

## 2017-07-08 ENCOUNTER — Encounter: Payer: Self-pay | Admitting: Cardiology

## 2017-07-08 ENCOUNTER — Other Ambulatory Visit: Payer: Self-pay

## 2017-07-08 MED ORDER — FUROSEMIDE 40 MG PO TABS: 40.0000 mg | ORAL_TABLET | Freq: Every day | ORAL | 3 refills | Status: DC

## 2017-07-08 NOTE — Telephone Encounter (Signed)
Patient's wife in office today for annual visit with Dr C. Requested refills of furosemide for patient. Rx(s) sent to patient's preferred pharmacy electronically.

## 2017-07-23 LAB — CUP PACEART REMOTE DEVICE CHECK
Battery Voltage: 3.15 V
Brady Statistic AP VP Percent: 0 %
Brady Statistic AS VP Percent: 94.02 %
Brady Statistic RA Percent Paced: 0 %
Brady Statistic RV Percent Paced: 94.02 %
Date Time Interrogation Session: 20190228054254
Implantable Lead Location: 753860
Implantable Lead Model: 5076
Implantable Pulse Generator Implant Date: 20180926
Lead Channel Impedance Value: 323 Ohm
Lead Channel Impedance Value: 342 Ohm
Lead Channel Impedance Value: 361 Ohm
Lead Channel Impedance Value: 418 Ohm
Lead Channel Setting Pacing Amplitude: 2.5 V
Lead Channel Setting Pacing Pulse Width: 0.4 ms
MDC IDC LEAD IMPLANT DT: 20080804
MDC IDC LEAD IMPLANT DT: 20080804
MDC IDC LEAD LOCATION: 753859
MDC IDC MSMT BATTERY REMAINING LONGEVITY: 134 mo
MDC IDC MSMT LEADCHNL RV PACING THRESHOLD AMPLITUDE: 0.875 V
MDC IDC MSMT LEADCHNL RV PACING THRESHOLD PULSEWIDTH: 0.4 ms
MDC IDC SET LEADCHNL RV SENSING SENSITIVITY: 4 mV
MDC IDC STAT BRADY AP VS PERCENT: 0 %
MDC IDC STAT BRADY AS VS PERCENT: 5.98 %

## 2017-08-15 ENCOUNTER — Other Ambulatory Visit: Payer: Self-pay | Admitting: Cardiovascular Disease

## 2017-09-06 DIAGNOSIS — Z7984 Long term (current) use of oral hypoglycemic drugs: Secondary | ICD-10-CM | POA: Diagnosis not present

## 2017-09-06 DIAGNOSIS — Z0000 Encounter for general adult medical examination without abnormal findings: Secondary | ICD-10-CM | POA: Diagnosis not present

## 2017-09-06 DIAGNOSIS — Z1389 Encounter for screening for other disorder: Secondary | ICD-10-CM | POA: Diagnosis not present

## 2017-09-06 DIAGNOSIS — N183 Chronic kidney disease, stage 3 (moderate): Secondary | ICD-10-CM | POA: Diagnosis not present

## 2017-09-06 DIAGNOSIS — E559 Vitamin D deficiency, unspecified: Secondary | ICD-10-CM | POA: Diagnosis not present

## 2017-09-06 DIAGNOSIS — E1140 Type 2 diabetes mellitus with diabetic neuropathy, unspecified: Secondary | ICD-10-CM | POA: Diagnosis not present

## 2017-09-06 DIAGNOSIS — E782 Mixed hyperlipidemia: Secondary | ICD-10-CM | POA: Diagnosis not present

## 2017-09-06 DIAGNOSIS — D649 Anemia, unspecified: Secondary | ICD-10-CM | POA: Diagnosis not present

## 2017-09-08 DIAGNOSIS — D0439 Carcinoma in situ of skin of other parts of face: Secondary | ICD-10-CM | POA: Diagnosis not present

## 2017-09-26 DIAGNOSIS — E782 Mixed hyperlipidemia: Secondary | ICD-10-CM | POA: Diagnosis not present

## 2017-09-26 DIAGNOSIS — E1122 Type 2 diabetes mellitus with diabetic chronic kidney disease: Secondary | ICD-10-CM | POA: Diagnosis not present

## 2017-09-26 DIAGNOSIS — E1165 Type 2 diabetes mellitus with hyperglycemia: Secondary | ICD-10-CM | POA: Diagnosis not present

## 2017-09-26 DIAGNOSIS — D649 Anemia, unspecified: Secondary | ICD-10-CM | POA: Diagnosis not present

## 2017-09-26 DIAGNOSIS — N183 Chronic kidney disease, stage 3 (moderate): Secondary | ICD-10-CM | POA: Diagnosis not present

## 2017-09-29 DIAGNOSIS — D649 Anemia, unspecified: Secondary | ICD-10-CM | POA: Diagnosis not present

## 2017-09-29 DIAGNOSIS — E782 Mixed hyperlipidemia: Secondary | ICD-10-CM | POA: Diagnosis not present

## 2017-09-29 DIAGNOSIS — N183 Chronic kidney disease, stage 3 (moderate): Secondary | ICD-10-CM | POA: Diagnosis not present

## 2017-09-29 DIAGNOSIS — E559 Vitamin D deficiency, unspecified: Secondary | ICD-10-CM | POA: Diagnosis not present

## 2017-09-29 DIAGNOSIS — I2510 Atherosclerotic heart disease of native coronary artery without angina pectoris: Secondary | ICD-10-CM | POA: Diagnosis not present

## 2017-09-29 DIAGNOSIS — E1165 Type 2 diabetes mellitus with hyperglycemia: Secondary | ICD-10-CM | POA: Diagnosis not present

## 2017-09-29 DIAGNOSIS — C61 Malignant neoplasm of prostate: Secondary | ICD-10-CM | POA: Diagnosis not present

## 2017-09-29 DIAGNOSIS — E1122 Type 2 diabetes mellitus with diabetic chronic kidney disease: Secondary | ICD-10-CM | POA: Diagnosis not present

## 2017-09-29 DIAGNOSIS — E1159 Type 2 diabetes mellitus with other circulatory complications: Secondary | ICD-10-CM | POA: Diagnosis not present

## 2017-10-04 ENCOUNTER — Ambulatory Visit: Payer: Medicare HMO | Admitting: Diagnostic Neuroimaging

## 2017-10-04 ENCOUNTER — Encounter: Payer: Self-pay | Admitting: Diagnostic Neuroimaging

## 2017-10-04 VITALS — BP 120/71 | HR 74 | Ht 68.0 in | Wt 180.4 lb

## 2017-10-04 DIAGNOSIS — G20 Parkinson's disease: Secondary | ICD-10-CM

## 2017-10-04 DIAGNOSIS — R269 Unspecified abnormalities of gait and mobility: Secondary | ICD-10-CM

## 2017-10-04 DIAGNOSIS — E1142 Type 2 diabetes mellitus with diabetic polyneuropathy: Secondary | ICD-10-CM | POA: Diagnosis not present

## 2017-10-04 MED ORDER — CARBIDOPA-LEVODOPA 25-100 MG PO TABS: 1.0000 | ORAL_TABLET | Freq: Three times a day (TID) | ORAL | 4 refills | Status: DC

## 2017-10-04 NOTE — Progress Notes (Signed)
[facility]  PATIENT: [name] Powell DOB: [birth year]  REFERRING CLINICIAN: Edwin Dada, MD HISTORY FROM: patient, wife REASON FOR VISIT: follow up   HISTORICAL  CHIEF COMPLAINT:  Chief Complaint  Patient presents with  . Follow-up    Rm 7, wife in attendance.  . Parkinson's Disease    doing well, no falls.  Using cane this am.  Has eye appt with Dr. Prudencio Burly.      HISTORY OF PRESENT ILLNESS:   UPDATE (10/04/17, VRP): Since last visit, doing well. Tolerating meds. No alleviating or aggravating factors. No falls.   UPDATE 12/31/16: Since last visit, feels well. Tolerating carb/levo. No major changes. No falls.   UPDATE 07/02/16: Since last visit, doing well. Tolerating carb/levo (10am, 5pm, 10pm). Wife administers meds. No on-off fluctuations. No falls. No injuries. Using cane. Some left sided abd pain x 2 months; no tenderness to palpation. No pain with breathing. No injuries.   UPDATE 12/30/15: Since last visit tried coming off carb/levo, and then had worsening mobility and energy. Now back on carb/levo and doing better.   UPDATE 09/25/15: Since last visit, doing better, except for hosp stay for pneumonia in April 2017. Patient doesn't feel carb/levo is helping, but family think he has more energy now. PT also helping. Now on home O2 since past 1 week.  PRIOR HPI (08/13/15): 84 year old right-handed male here for evaluation of gait instability, memory loss, frequent falling.  For past 2 years patient has had increasing problems with balance, walking, coordination and increasing falls. Over the past 3 weeks patient has had generalized weakness, walking on toes, increasing shuffling gait. Patient also having problems with short-term memory, confusion, difficulty with tasks such as crossword puzzles which he used to be able to do easily. Patient is a retired Art gallery manager and was very high functioning from intellectual standpoint. Patient has had significant decline  from his previous level of functioning. Patient has developed quiet hoarse voice, word finding difficulties, decreased facial expressions. In general he has slowed down quite a bit.  Patient also has constipation and urinary incontinence. He has tendency to punch and kick in his sleep and act out his dreams although this is been going on for more than 25 years according to patient's wife. No change in sense of smell or taste.   REVIEW OF SYSTEMS: Full 14 system review of systems performed and negative with exception of: only as per HPI.   ALLERGIES: Allergies  Allergen Reactions  . Lanoxin [Digoxin] Rash    Eyelid rash  . Mexitil [Mexiletine] Other (See Comments)    unknown    HOME MEDICATIONS: Outpatient Medications Prior to Visit  Medication Sig Dispense Refill  . atorvastatin (LIPITOR) 20 MG tablet Take 20 mg by mouth daily.    . carbidopa-levodopa (SINEMET IR) 25-100 MG tablet Take 1 tablet by mouth 3 (three) times daily. 270 tablet 4  . Cholecalciferol (VITAMIN D PO) Take 10,000 Units by mouth every Friday. On Friday     . ELIQUIS 2.5 MG TABS tablet TAKE 1 TABLET TWICE DAILY 180 tablet 1  . furosemide (LASIX) 40 MG tablet Take 1 tablet (40 mg total) by mouth daily. 90 tablet 3  . glimepiride (AMARYL) 4 MG tablet Take 4 mg by mouth daily before breakfast.    . JANUVIA 50 MG tablet Take 1 tablet by mouth every evening.     . metFORMIN (GLUCOPHAGE-XR) 500 MG 24 hr tablet Take 500 mg by mouth daily with breakfast.     .  metoprolol tartrate (LOPRESSOR) 50 MG tablet TAKE 2 TABLETS IN THE MORNING  AND TAKE 1 TABLET IN THE EVENING 270 tablet 3  . Multiple Vitamins-Minerals (PRESERVISION AREDS) CAPS Take 1 capsule by mouth 2 (two) times daily.    . polyethylene glycol (MIRALAX / GLYCOLAX) packet Take 17 g by mouth daily. To prevent constipation     . potassium chloride (K-DUR,KLOR-CON) 10 MEQ tablet Take 1 tablet (10 mEq total) by mouth daily. 90 tablet 3  . valsartan (DIOVAN) 160 MG tablet  Take 1 tablet (160 mg total) by mouth daily. 90 tablet 3  . vitamin B-12 (CYANOCOBALAMIN) 1000 MCG tablet Take 1,000 mcg by mouth daily.     No facility-administered medications prior to visit.     PAST MEDICAL HISTORY: Past Medical History:  Diagnosis Date  . Atrial fibrillation ([place])   . Cancer ([place])   . CHB (complete heart block) ([place])   . Coronary artery disease   . Diabetes mellitus   . Dyslipidemia   . H/O prostate cancer   . Hypertension   . New onset atrial flutter, persistent 07/04/2014  . Pacemaker 12/12/2006   Medtronic adapta  . Pleural effusion, left   . Pneumonia 09/2015  . Prostate cancer ([place])   . 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 Ventricular tachycardia (paroxysmal) ([place]) 03/28/2014    PAST SURGICAL HISTORY: Past Surgical History:  Procedure Laterality Date  . CORONARY ARTERY BYPASS GRAFT  09/04/2001   LIMA to LAD,SVG to diagonal,SVG to ramus intermedius,SVG to PDA  . NM MYOVIEW LTD  01/09/2010   no ischemia  . PACEMAKER INSERTION  12/12/2006   Medtronic adapta  . PPM GENERATOR CHANGEOUT N/A 02/02/2017   Procedure: PPM GENERATOR CHANGEOUT - DUAL CHAMBER;  Surgeon: [name], MD;  Location: [facility];  Service: Cardiovascular;  Laterality: N/A;  . PROSTATE SURGERY  2001   cancer  . TONSILLECTOMY      FAMILY HISTORY: Family History  Problem Relation Age of Onset  . Heart disease Mother     SOCIAL HISTORY:  Social History   Socioeconomic History  . Marital status: Married    Spouse name: Inez Catalina  . Number of children: 2  . Years of education: 42  . Highest education level: Not on file  Occupational History    Comment: retired Art gallery manager  Social Needs  . Financial resource strain: Not on file  . Food insecurity:    Worry: Not on file    Inability: Not on file  . Transportation needs:    Medical: Not on file    Non-medical: Not on file  Tobacco Use  . Smoking  status: Former Smoker    Types: Cigarettes, Cigars    Last attempt to quit: 05/14/1962    Years since quitting: 55.4  . Smokeless tobacco: Never Used  Substance and Sexual Activity  . Alcohol use: Yes    Comment: seldom  . Drug use: No  . Sexual activity: Not on file  Lifestyle  . Physical activity:    Days per week: Not on file    Minutes per session: Not on file  . Stress: Not on file  Relationships  . Social connections:    Talks on phone: Not on file    Gets together: Not on file    Attends religious service: Not on file    Active member of club or organization: Not on file  Attends meetings of clubs or organizations: Not on file    Relationship status: Not on file  . Intimate partner violence:    Fear of current or ex partner: Not on file    Emotionally abused: Not on file    Physically abused: Not on file    Forced sexual activity: Not on file  Other Topics Concern  . Not on file  Social History Narrative   Lives at home with wife   Caffeine -seldom     PHYSICAL EXAM  GENERAL EXAM/CONSTITUTIONAL: Vitals:  Vitals:   10/04/17 0816  BP: 120/71  Pulse: 74  Weight: 180 lb 6.4 oz (81.8 kg)  Height: 5\' 8"  (1.727 m)   Body mass index is 27.43 kg/m. No exam data present  Patient is in no distress; well developed, nourished and groomed; neck is supple  CARDIOVASCULAR:  Examination of carotid arteries is normal; no carotid bruits  Regular rate and rhythm, no murmurs  Examination of peripheral vascular system by observation and palpation is normal  EYES:  Ophthalmoscopic exam of optic discs and posterior segments is normal; no papilledema or hemorrhages  MUSCULOSKELETAL:  Gait, strength, tone, movements noted in Neurologic exam below  NEUROLOGIC: MENTAL STATUS:  MMSE - Mini Mental State Exam 08/13/2015  Orientation to time 5  Orientation to Place 5  Registration 3  Attention/ Calculation 2  Recall 2  Language- name 2 objects 2  Language- repeat 1    Language- follow 3 step command 3  Language- read & follow direction 1  Write a sentence 1  Copy design 1  Total score 26    awake, alert, oriented to person, place and time  recent and remote memory intact  normal attention and concentration  language fluent, comprehension intact, naming intact,   fund of knowledge appropriate  CRANIAL NERVE:   2nd - no papilledema on fundoscopic exam  2nd, 3rd, 4th, 6th - pupils equal and reactive to light, visual fields full to confrontation, extraocular muscles intact, no nystagmus  5th - facial sensation symmetric  7th - facial strength symmetric  8th - hearing intact  9th - palate elevates symmetrically, uvula midline  11th - shoulder shrug symmetric  12th - tongue protrusion midline  HOARSE VOICE  MASKED FACIES  MOTOR:   normal bulk  MILD COGWHEELING RIGIDITY IN BUE  MODERATE BRADYKINESIA IN BLE; MILD BRADYKINESIA IN BUE  full strength in the BUE, BLE  SENSORY:   normal and symmetric to light touch, temperature  DECR VIB AT TOES AND ANKLES  COORDINATION:   finger-nose-finger, fine finger movements normal  REFLEXES:   deep tendon reflexes TRACE IN BUE; ABSENT IN BLE  POSITIVE MYERSONS  NEGATIVE SNOUT  GAIT/STATION:   narrow based gait; SLOW TO RISE; STOOPED POSTURE; DECR ARM SWING; SHORT STEPS; HAS SINGLE POINT CANE BUT ABLE TO WALK SHORT DISTANCES WITHOUT CANE    DIAGNOSTIC DATA (LABS, IMAGING, TESTING) - I reviewed patient records, labs, notes, testing and imaging myself where available.  Lab Results  Component Value Date   WBC 6.6 01/28/2017   HGB 9.8 (L) 01/28/2017   HCT 30.4 (L) 01/28/2017   MCV 97 01/28/2017   PLT 195 01/28/2017      Component Value Date/Time   NA 145 (H) 01/28/2017 0859   K 5.0 01/28/2017 0859   CL 104 01/28/2017 0859   CO2 25 01/28/2017 0859   GLUCOSE 153 (H) 01/28/2017 0859   GLUCOSE 283 (H) 07/29/2016 0432   BUN 33 01/28/2017 0859  CREATININE 1.65 (H)  01/28/2017 0859   CREATININE 1.16 (H) 10/07/2015 1223   CALCIUM 8.9 01/28/2017 0859   PROT 6.1 (L) 07/27/2016 0449   ALBUMIN 3.0 (L) 07/27/2016 0449   AST 30 07/27/2016 0449   ALT 27 07/27/2016 0449   ALKPHOS 91 07/27/2016 0449   BILITOT 0.5 07/27/2016 0449   GFRNONAA 36 (L) 01/28/2017 0859   GFRAA 41 (L) 01/28/2017 0859   No results found for: CHOL, HDL, LDLCALC, LDLDIRECT, TRIG, CHOLHDL Lab Results  Component Value Date   HGBA1C 8.6 (H) 09/15/2015   No results found for: VITAMINB12 Lab Results  Component Value Date   TSH 1.957 07/26/2016    10/13/11 CT head [I reviewed images myself and agree with interpretation. -VRP]  1. No acute intracranial or calvarial findings. 2. Left frontal scalp soft tissue swelling.  12/11/13 TTE - Normal LV size with moderate LV hypertrophy. EF 55% with septal-lateral dyssynchrony probably from pacing and apicalhypokinesis. Mildly dilated RV wtih mildly decreased systolicfunction.  08/25/15 CT head [I reviewed images myself and agree with interpretation. -VRP]  1. Moderate cortical atrophy, essentially unchanged when compared to the CT scan from 10/13/2011. 2. Moderate hypodense changes in the white matter of both hemispheres consistent with chronic microvascular ischemic change, essentially unchanged when compared to the 2013 CT. 3. There are no acute findings.    ASSESSMENT AND PLAN  84 y.o. year old male here with gradual onset progressive balance and gait difficulty, short steps, shuffling gait, masked facies, hoarse voice, word finding difficulties, short-term memory loss, decreased cognitive ability and generalized weakness. Signs and symptoms are suspicious for neurodegenerative process such as Parkinson's disease. Patient also has signs of diabetic neuropathy may be contributed factors well.    Ddx: parkinsonism (idiopathic vs vascular) + diabetic neuropathy  No diagnosis found.   PLAN:  I spent 15 minutes of face to face  time with patient. Greater than 50% of time was spent in counseling and coordination of care with patient. In summary we discussed:   PARKINSON'S DISEASE (established problem, stable) - use rollator walker or cane as needed - continue physical therapy exercises - continue carbidopa-levodopa 1 tab three times a day  - encouraged mild stretching and physical activity  DIABETIC NEUROPATHY (established problem, stable) - continue diabetes control per PCP - continue balance exercises  Meds ordered this encounter  Medications  . carbidopa-levodopa (SINEMET IR) 25-100 MG tablet    Sig: Take 1 tablet by mouth 3 (three) times daily.    Dispense:  270 tablet    Refill:  4   Return in about 1 year (around 10/05/2018) for with NP.    [name], MD 1/57/2620, 3:55 AM Certified in Neurology, Neurophysiology and [facility] [address] [place], [place] [address] [phone]

## 2017-10-06 ENCOUNTER — Ambulatory Visit (INDEPENDENT_AMBULATORY_CARE_PROVIDER_SITE_OTHER): Payer: Medicare HMO | Admitting: *Deleted

## 2017-10-06 DIAGNOSIS — R001 Bradycardia, unspecified: Secondary | ICD-10-CM

## 2017-10-06 NOTE — Progress Notes (Signed)
Remote pacemaker transmission.   

## 2017-10-10 ENCOUNTER — Encounter: Payer: Self-pay | Admitting: Cardiology

## 2017-10-11 LAB — CUP PACEART REMOTE DEVICE CHECK
Battery Remaining Longevity: 131 mo
Battery Voltage: 3.09 V
Brady Statistic AP VP Percent: 0 %
Brady Statistic AS VP Percent: 94.83 %
Brady Statistic RA Percent Paced: 0 %
Implantable Lead Implant Date: 20080804
Implantable Lead Location: 753860
Implantable Lead Model: 5076
Implantable Pulse Generator Implant Date: 20180926
Lead Channel Impedance Value: 323 Ohm
Lead Channel Impedance Value: 342 Ohm
Lead Channel Impedance Value: 361 Ohm
Lead Channel Impedance Value: 418 Ohm
Lead Channel Pacing Threshold Pulse Width: 0.4 ms
Lead Channel Sensing Intrinsic Amplitude: 7 mV
Lead Channel Setting Pacing Amplitude: 2.5 V
Lead Channel Setting Sensing Sensitivity: 4 mV
MDC IDC LEAD IMPLANT DT: 20080804
MDC IDC LEAD LOCATION: 753859
MDC IDC MSMT LEADCHNL RV PACING THRESHOLD AMPLITUDE: 0.875 V
MDC IDC MSMT LEADCHNL RV SENSING INTR AMPL: 7 mV
MDC IDC SESS DTM: 20190530054701
MDC IDC SET LEADCHNL RV PACING PULSEWIDTH: 0.4 ms
MDC IDC STAT BRADY AP VS PERCENT: 0 %
MDC IDC STAT BRADY AS VS PERCENT: 5.17 %
MDC IDC STAT BRADY RV PERCENT PACED: 94.83 %

## 2017-10-14 DIAGNOSIS — Z961 Presence of intraocular lens: Secondary | ICD-10-CM | POA: Diagnosis not present

## 2017-10-14 DIAGNOSIS — H353132 Nonexudative age-related macular degeneration, bilateral, intermediate dry stage: Secondary | ICD-10-CM | POA: Diagnosis not present

## 2017-10-14 DIAGNOSIS — H524 Presbyopia: Secondary | ICD-10-CM | POA: Diagnosis not present

## 2017-10-14 DIAGNOSIS — H04123 Dry eye syndrome of bilateral lacrimal glands: Secondary | ICD-10-CM | POA: Diagnosis not present

## 2017-11-21 ENCOUNTER — Other Ambulatory Visit: Payer: Self-pay | Admitting: Cardiovascular Disease

## 2017-11-22 NOTE — Telephone Encounter (Signed)
Rx sent to pharmacy   

## 2018-01-02 DIAGNOSIS — E1165 Type 2 diabetes mellitus with hyperglycemia: Secondary | ICD-10-CM | POA: Diagnosis not present

## 2018-01-02 DIAGNOSIS — E538 Deficiency of other specified B group vitamins: Secondary | ICD-10-CM | POA: Diagnosis not present

## 2018-01-02 DIAGNOSIS — D649 Anemia, unspecified: Secondary | ICD-10-CM | POA: Diagnosis not present

## 2018-01-02 DIAGNOSIS — E782 Mixed hyperlipidemia: Secondary | ICD-10-CM | POA: Diagnosis not present

## 2018-01-05 ENCOUNTER — Ambulatory Visit (INDEPENDENT_AMBULATORY_CARE_PROVIDER_SITE_OTHER): Payer: Medicare HMO | Admitting: *Deleted

## 2018-01-05 DIAGNOSIS — R001 Bradycardia, unspecified: Secondary | ICD-10-CM

## 2018-01-05 NOTE — Progress Notes (Signed)
Remote pacemaker transmission.   

## 2018-01-06 DIAGNOSIS — E782 Mixed hyperlipidemia: Secondary | ICD-10-CM | POA: Diagnosis not present

## 2018-01-06 DIAGNOSIS — C61 Malignant neoplasm of prostate: Secondary | ICD-10-CM | POA: Diagnosis not present

## 2018-01-06 DIAGNOSIS — N183 Chronic kidney disease, stage 3 (moderate): Secondary | ICD-10-CM | POA: Diagnosis not present

## 2018-01-06 DIAGNOSIS — I2510 Atherosclerotic heart disease of native coronary artery without angina pectoris: Secondary | ICD-10-CM | POA: Diagnosis not present

## 2018-01-06 DIAGNOSIS — E1122 Type 2 diabetes mellitus with diabetic chronic kidney disease: Secondary | ICD-10-CM | POA: Diagnosis not present

## 2018-01-06 DIAGNOSIS — E559 Vitamin D deficiency, unspecified: Secondary | ICD-10-CM | POA: Diagnosis not present

## 2018-01-06 DIAGNOSIS — E663 Overweight: Secondary | ICD-10-CM | POA: Diagnosis not present

## 2018-01-06 DIAGNOSIS — E538 Deficiency of other specified B group vitamins: Secondary | ICD-10-CM | POA: Diagnosis not present

## 2018-01-06 DIAGNOSIS — D649 Anemia, unspecified: Secondary | ICD-10-CM | POA: Diagnosis not present

## 2018-01-12 DIAGNOSIS — H353132 Nonexudative age-related macular degeneration, bilateral, intermediate dry stage: Secondary | ICD-10-CM | POA: Diagnosis not present

## 2018-01-27 LAB — CUP PACEART REMOTE DEVICE CHECK
Battery Voltage: 3.04 V
Brady Statistic AP VP Percent: 0 %
Brady Statistic AP VS Percent: 0 %
Brady Statistic RA Percent Paced: 0 %
Date Time Interrogation Session: 20190829054702
Implantable Lead Location: 753860
Implantable Pulse Generator Implant Date: 20180926
Lead Channel Impedance Value: 342 Ohm
Lead Channel Impedance Value: 418 Ohm
Lead Channel Setting Pacing Amplitude: 2.5 V
Lead Channel Setting Pacing Pulse Width: 0.4 ms
MDC IDC LEAD IMPLANT DT: 20080804
MDC IDC LEAD IMPLANT DT: 20080804
MDC IDC LEAD LOCATION: 753859
MDC IDC MSMT BATTERY REMAINING LONGEVITY: 128 mo
MDC IDC MSMT LEADCHNL RV IMPEDANCE VALUE: 304 Ohm
MDC IDC MSMT LEADCHNL RV IMPEDANCE VALUE: 361 Ohm
MDC IDC MSMT LEADCHNL RV PACING THRESHOLD AMPLITUDE: 0.875 V
MDC IDC MSMT LEADCHNL RV PACING THRESHOLD PULSEWIDTH: 0.4 ms
MDC IDC MSMT LEADCHNL RV SENSING INTR AMPL: 7 mV
MDC IDC MSMT LEADCHNL RV SENSING INTR AMPL: 7 mV
MDC IDC SET LEADCHNL RV SENSING SENSITIVITY: 4 mV
MDC IDC STAT BRADY AS VP PERCENT: 89.7 %
MDC IDC STAT BRADY AS VS PERCENT: 10.3 %
MDC IDC STAT BRADY RV PERCENT PACED: 89.7 %

## 2018-02-08 DIAGNOSIS — Z23 Encounter for immunization: Secondary | ICD-10-CM | POA: Diagnosis not present

## 2018-02-14 DIAGNOSIS — D044 Carcinoma in situ of skin of scalp and neck: Secondary | ICD-10-CM | POA: Diagnosis not present

## 2018-02-14 DIAGNOSIS — I872 Venous insufficiency (chronic) (peripheral): Secondary | ICD-10-CM | POA: Diagnosis not present

## 2018-03-21 ENCOUNTER — Other Ambulatory Visit: Payer: Self-pay | Admitting: Cardiovascular Disease

## 2018-03-25 ENCOUNTER — Other Ambulatory Visit: Payer: Self-pay | Admitting: Cardiovascular Disease

## 2018-03-27 ENCOUNTER — Telehealth: Payer: Self-pay | Admitting: Cardiovascular Disease

## 2018-03-27 MED ORDER — APIXABAN 2.5 MG PO TABS: 2.5000 mg | ORAL_TABLET | Freq: Two times a day (BID) | ORAL | 0 refills | Status: DC

## 2018-03-27 NOTE — Telephone Encounter (Signed)
Wife came to office  For sample pick up

## 2018-03-27 NOTE — Telephone Encounter (Signed)
Scr 1.6 [facility]

## 2018-03-27 NOTE — Telephone Encounter (Signed)
New Message   Patient calling the office for samples of medication:   1.  What medication and dosage are you requesting samples for?ELIQUIS 2.5 MG TABS tablet  2.  Are you currently out of this medication? Two days remaining

## 2018-04-10 ENCOUNTER — Other Ambulatory Visit: Payer: Self-pay | Admitting: Cardiovascular Disease

## 2018-04-10 NOTE — Telephone Encounter (Signed)
Rx(s) sent to pharmacy electronically.  

## 2018-04-11 ENCOUNTER — Ambulatory Visit (INDEPENDENT_AMBULATORY_CARE_PROVIDER_SITE_OTHER): Payer: Medicare HMO

## 2018-04-11 ENCOUNTER — Telehealth: Payer: Self-pay

## 2018-04-11 DIAGNOSIS — I442 Atrioventricular block, complete: Secondary | ICD-10-CM | POA: Diagnosis not present

## 2018-04-11 NOTE — Telephone Encounter (Signed)
LMOVM reminding pt to send remote transmission.   

## 2018-04-12 NOTE — Progress Notes (Signed)
Remote pacemaker transmission.   

## 2018-04-14 DIAGNOSIS — E1165 Type 2 diabetes mellitus with hyperglycemia: Secondary | ICD-10-CM | POA: Diagnosis not present

## 2018-04-14 DIAGNOSIS — E782 Mixed hyperlipidemia: Secondary | ICD-10-CM | POA: Diagnosis not present

## 2018-04-14 DIAGNOSIS — D649 Anemia, unspecified: Secondary | ICD-10-CM | POA: Diagnosis not present

## 2018-04-14 DIAGNOSIS — E538 Deficiency of other specified B group vitamins: Secondary | ICD-10-CM | POA: Diagnosis not present

## 2018-04-18 DIAGNOSIS — E538 Deficiency of other specified B group vitamins: Secondary | ICD-10-CM | POA: Diagnosis not present

## 2018-04-18 DIAGNOSIS — D649 Anemia, unspecified: Secondary | ICD-10-CM | POA: Diagnosis not present

## 2018-04-18 DIAGNOSIS — E1159 Type 2 diabetes mellitus with other circulatory complications: Secondary | ICD-10-CM | POA: Diagnosis not present

## 2018-04-18 DIAGNOSIS — E559 Vitamin D deficiency, unspecified: Secondary | ICD-10-CM | POA: Diagnosis not present

## 2018-04-18 DIAGNOSIS — I2510 Atherosclerotic heart disease of native coronary artery without angina pectoris: Secondary | ICD-10-CM | POA: Diagnosis not present

## 2018-04-18 DIAGNOSIS — E782 Mixed hyperlipidemia: Secondary | ICD-10-CM | POA: Diagnosis not present

## 2018-04-18 DIAGNOSIS — E1165 Type 2 diabetes mellitus with hyperglycemia: Secondary | ICD-10-CM | POA: Diagnosis not present

## 2018-04-18 DIAGNOSIS — E1122 Type 2 diabetes mellitus with diabetic chronic kidney disease: Secondary | ICD-10-CM | POA: Diagnosis not present

## 2018-04-18 DIAGNOSIS — C61 Malignant neoplasm of prostate: Secondary | ICD-10-CM | POA: Diagnosis not present

## 2018-04-26 ENCOUNTER — Other Ambulatory Visit: Payer: Self-pay | Admitting: Cardiovascular Disease

## 2018-05-10 LAB — CUP PACEART REMOTE DEVICE CHECK
Battery Remaining Longevity: 124 mo
Battery Voltage: 3.03 V
Implantable Lead Implant Date: 20080804
Implantable Lead Location: 753859
Implantable Pulse Generator Implant Date: 20180926
Lead Channel Impedance Value: 304 Ohm
Lead Channel Impedance Value: 342 Ohm
Lead Channel Impedance Value: 342 Ohm
Lead Channel Pacing Threshold Amplitude: 0.75 V
Lead Channel Pacing Threshold Pulse Width: 0.4 ms
Lead Channel Sensing Intrinsic Amplitude: 10.125 mV
Lead Channel Sensing Intrinsic Amplitude: 10.125 mV
Lead Channel Setting Pacing Amplitude: 2.5 V
Lead Channel Setting Pacing Pulse Width: 0.4 ms
MDC IDC LEAD IMPLANT DT: 20080804
MDC IDC LEAD LOCATION: 753860
MDC IDC MSMT LEADCHNL RA IMPEDANCE VALUE: 418 Ohm
MDC IDC SESS DTM: 20191203175504
MDC IDC SET LEADCHNL RV SENSING SENSITIVITY: 4 mV
MDC IDC STAT BRADY AP VP PERCENT: 0 %
MDC IDC STAT BRADY AP VS PERCENT: 0 %
MDC IDC STAT BRADY AS VP PERCENT: 92.74 %
MDC IDC STAT BRADY AS VS PERCENT: 7.26 %
MDC IDC STAT BRADY RA PERCENT PACED: 0 %
MDC IDC STAT BRADY RV PERCENT PACED: 92.74 %

## 2018-05-17 ENCOUNTER — Ambulatory Visit (INDEPENDENT_AMBULATORY_CARE_PROVIDER_SITE_OTHER): Payer: Medicare HMO | Admitting: Cardiovascular Disease

## 2018-05-17 ENCOUNTER — Encounter (INDEPENDENT_AMBULATORY_CARE_PROVIDER_SITE_OTHER): Payer: Self-pay

## 2018-05-17 ENCOUNTER — Encounter: Payer: Self-pay | Admitting: Cardiovascular Disease

## 2018-05-17 VITALS — BP 142/62 | HR 80 | Ht 68.0 in | Wt 182.6 lb

## 2018-05-17 DIAGNOSIS — I442 Atrioventricular block, complete: Secondary | ICD-10-CM

## 2018-05-17 DIAGNOSIS — Z7901 Long term (current) use of anticoagulants: Secondary | ICD-10-CM | POA: Diagnosis not present

## 2018-05-17 DIAGNOSIS — I25810 Atherosclerosis of coronary artery bypass graft(s) without angina pectoris: Secondary | ICD-10-CM

## 2018-05-17 DIAGNOSIS — Z950 Presence of cardiac pacemaker: Secondary | ICD-10-CM | POA: Diagnosis not present

## 2018-05-17 DIAGNOSIS — N183 Chronic kidney disease, stage 3 unspecified: Secondary | ICD-10-CM

## 2018-05-17 DIAGNOSIS — I472 Ventricular tachycardia: Secondary | ICD-10-CM | POA: Diagnosis not present

## 2018-05-17 DIAGNOSIS — I4821 Permanent atrial fibrillation: Secondary | ICD-10-CM | POA: Diagnosis not present

## 2018-05-17 DIAGNOSIS — E1122 Type 2 diabetes mellitus with diabetic chronic kidney disease: Secondary | ICD-10-CM

## 2018-05-17 DIAGNOSIS — I5032 Chronic diastolic (congestive) heart failure: Secondary | ICD-10-CM

## 2018-05-17 DIAGNOSIS — I4729 Other ventricular tachycardia: Secondary | ICD-10-CM

## 2018-05-17 DIAGNOSIS — E785 Hyperlipidemia, unspecified: Secondary | ICD-10-CM | POA: Diagnosis not present

## 2018-05-17 NOTE — Patient Instructions (Signed)
Medication Instructions:  Dr Sallyanne Kuster recommends that you continue on your current medications as directed. Please refer to the Current Medication list given to you today.  If you need a refill on your cardiac medications before your next appointment, please call your pharmacy.   Testing/Procedures: Remote monitoring is used to monitor your Pacemaker or ICD from home. This monitoring reduces the number of office visits required to check your device to one time per year. It allows [place] to keep an eye on the functioning of your device to ensure it is working properly. You are scheduled for a device check from home on Tuesday, March 3rd, 2020. You may send your transmission at any time that day. If you have a wireless device, the transmission will be sent automatically. After your physician reviews your transmission, you will receive a postcard with your next transmission date.  To improve our patient care and to more adequately follow your device, [facility] has decided, as a practice, to start following each patient four times a year with your home monitor. This means that you may experience a remote appointment that is close to an in-office appointment with your physician. Your insurance will apply at the same rate as other remote monitoring transmissions.  Follow-Up: At [facility], you and your health needs are our priority.  As part of our continuing mission to provide you with exceptional heart care, we have created designated Provider Care Teams.  These Care Teams include your primary Cardiologist (physician) and Advanced Practice Providers (APPs -  Physician Assistants and Nurse Practitioners) who all work together to provide you with the care you need, when you need it. You will need a follow up appointment in 12 months.  Please call our office 2 months in advance to schedule this appointment.  You may see [name], MD or one of the following Advanced Practice Providers on your  designated Care Team: [place], [place] . Fabian Sharp, PA-C . You will receive a reminder letter in the mail two months in advance. If you don't receive a letter, please call our office to schedule the follow-up appointment.

## 2018-05-17 NOTE — Progress Notes (Signed)
Patient ID: [name] Powell, male   DOB: 06/06/1922, 85 y.o.   MRN: [id number]    Cardiology Office Note    Date:  05/17/2018   ID:  Lamoine, Magallon [birth year], MRN [id number]  PCP:  [name], MD  Cardiologist:   [name], MD   Chief Complaint  Patient presents with  . Follow-up    pt denied chest pain, SOB and dizziness  . Atrial Fibrillation  . Pacemaker Check    History of Present Illness:  [name] Powell is a 85 y.o. male with CAD s/p CABG, sinus arrest and complete heart block (pacemaker dependent), now in long-term persistent atrial fibrillation, chronic diastolic heart failure, Parkinson's disease. He has a dual-chamber permanent pacemaker implanted in 2008, generator change out September 2018 (Medtronic Azure) and is pacemaker dependent.  The patient specifically denies any chest pain at rest exertion, dyspnea at rest or with exertion, orthopnea, paroxysmal nocturnal dyspnea, syncope, palpitations, focal neurological deficits, intermittent claudication, lower extremity edema, unexplained weight gain, cough, hemoptysis or wheezing.  He has not had any major health challenges since his last appointment.  Activity level continues to be limited by slow shuffling gait related to Parkinson's disease.  He has not had injuries or falls.  He has not had any serious bleeding problems.  Recent labs show very mild anemia with macrocytic indices moderately abnormal kidney function, lipid profile within desirable range and acceptable glycemic control.  Treatment with carbidopa levodopa is working reasonably well.  Interrogation of his dual-chamber permanent pacemaker (Medtronic Azure implanted September 2018) shows ongoing persistent atrial fibrillation and 93% ventricular paced rhythm.  There is no ventricular escape rhythm when pacing is taken down to 30 bpm today.  No episodes of high ventricular rate have been recorded.  Estimated generator longevity is over 10 years.   Activity is only 0.5 hours/day.  This has not changed.  Almost 16 years have passed since his CABG surgery. His last nuclear study in August 2015 was low risk (small apical scar, no ischemia, EF 44%). Echo August 2015 confirmed apical akinesis, but EF was estimated at 55%. 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  He has been in long-term persistent atrial fibrillation for well over a year. He is very sensitive to programming changes. He feels better at lower rate limit programmed at 60 rather than 70 bpm. He has treated coronary risk factors (hypertension, hyperlipidemia, diabetes mellitus type 2 on oral antidiabetics).   Past Medical History:  Diagnosis Date  . Atrial fibrillation ([name])   . Cancer ([name])   . CHB (complete heart block) ([name])   . Coronary artery disease   . Diabetes mellitus   . Dyslipidemia   . H/O prostate cancer   . Hypertension   . New onset atrial flutter, persistent 07/04/2014  . Pacemaker 12/12/2006   Medtronic adapta  . Pleural effusion, left   . Pneumonia 09/2015  . Prostate cancer ([name])   . 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 Ventricular tachycardia (paroxysmal) ([name]) 03/28/2014    Past Surgical History:  Procedure Laterality Date  . CORONARY ARTERY BYPASS GRAFT  09/04/2001   LIMA to LAD,SVG to diagonal,SVG to ramus intermedius,SVG to PDA  . NM MYOVIEW LTD  01/09/2010   no ischemia  . PACEMAKER INSERTION  12/12/2006   Medtronic adapta  . PPM GENERATOR CHANGEOUT N/A 02/02/2017   Procedure: PPM GENERATOR CHANGEOUT -  DUAL CHAMBER;  Surgeon: [name], MD;  Location: [name];  Service: Cardiovascular;  Laterality: N/A;  . PROSTATE SURGERY  2001   cancer  . TONSILLECTOMY      Current Medications: Outpatient Medications Prior to Visit  Medication Sig Dispense Refill  . apixaban (ELIQUIS) 2.5 MG TABS tablet Take 1 tablet (2.5 mg total) by mouth 2 (two)  times daily. 28 tablet 0  . atorvastatin (LIPITOR) 20 MG tablet Take 20 mg by mouth daily.    . carbidopa-levodopa (SINEMET IR) 25-100 MG tablet Take 1 tablet by mouth 3 (three) times daily. 270 tablet 4  . Cholecalciferol (VITAMIN D PO) Take 10,000 Units by mouth every Friday. On Friday     . furosemide (LASIX) 40 MG tablet Take 1 tablet (40 mg total) by mouth daily. 90 tablet 3  . glimepiride (AMARYL) 4 MG tablet Take 4 mg by mouth daily before breakfast.    . JANUVIA 50 MG tablet Take 1 tablet by mouth every evening.     . metFORMIN (GLUCOPHAGE-XR) 500 MG 24 hr tablet Take 500 mg by mouth daily with breakfast.     . metoprolol tartrate (LOPRESSOR) 50 MG tablet TAKE 2 TABLETS IN THE MORNING  AND TAKE 1 TABLET IN THE EVENING 270 tablet 0  . Multiple Vitamins-Minerals (PRESERVISION AREDS) CAPS Take 1 capsule by mouth 2 (two) times daily.    . polyethylene glycol (MIRALAX / GLYCOLAX) packet Take 17 g by mouth daily. To prevent constipation     . potassium chloride (K-DUR,KLOR-CON) 10 MEQ tablet TAKE 1 TABLET (10 MEQ TOTAL) BY MOUTH DAILY. 90 tablet 2  . valsartan (DIOVAN) 160 MG tablet TAKE 1 TABLET EVERY DAY 90 tablet 3  . vitamin B-12 (CYANOCOBALAMIN) 1000 MCG tablet Take 1,000 mcg by mouth daily.    Marland Kitchen ELIQUIS 2.5 MG TABS tablet TAKE 1 TABLET TWICE DAILY 180 tablet 0   No facility-administered medications prior to visit.      Allergies:   Lanoxin [digoxin] and Mexitil [mexiletine]   Social History   Socioeconomic History  . Marital status: Married    Spouse name: Inez Catalina  . Number of children: 2  . Years of education: 71  . Highest education level: Not on file  Occupational History    Comment: retired Art gallery manager  Social Needs  . Financial resource strain: Not on file  . Food insecurity:    Worry: Not on file    Inability: Not on file  . Transportation needs:    Medical: Not on file    Non-medical: Not on file  Tobacco Use  . Smoking status: Former Smoker    Types:  Cigarettes, Cigars    Last attempt to quit: 05/14/1962    Years since quitting: 56.0  . Smokeless tobacco: Never Used  Substance and Sexual Activity  . Alcohol use: Yes    Comment: seldom  . Drug use: No  . Sexual activity: Not on file  Lifestyle  . Physical activity:    Days per week: Not on file    Minutes per session: Not on file  . Stress: Not on file  Relationships  . Social connections:    Talks on phone: Not on file    Gets together: Not on file    Attends religious service: Not on file    Active member of club or organization: Not on file    Attends meetings of clubs or organizations: Not on file    Relationship status: Not on  file  Other Topics Concern  . Not on file  Social History Narrative   Lives at home with wife   Caffeine -seldom     Family History:  The patient's family history includes Heart disease in his mother.   ROS:   Please see the history of present illness.    ROS All other systems reviewed and are negative.   PHYSICAL EXAM:   VS:  BP (!) 142/62   Pulse 80   Ht 5\' 8"  (1.727 m)   Wt 182 lb 9.6 oz (82.8 kg)   BMI 27.76 kg/m     General: Alert, oriented x3, no distress, healthy subclavian pacemaker site Head: no evidence of trauma, PERRL, EOMI, no exophtalmos or lid lag, no myxedema, no xanthelasma; normal ears, nose and oropharynx Neck: normal jugular venous pulsations and no hepatojugular reflux; brisk carotid pulses without delay and no carotid bruits Chest: clear to auscultation, no signs of consolidation by percussion or palpation, normal fremitus, symmetrical and full respiratory excursions Cardiovascular: normal position and quality of the apical impulse, regular rhythm, normal first and second heart sounds, no murmurs, rubs or gallops Abdomen: no tenderness or distention, no masses by palpation, no abnormal pulsatility or arterial bruits, normal bowel sounds, no hepatosplenomegaly Extremities: no clubbing, cyanosis or edema; 2+ radial,  ulnar and brachial pulses bilaterally; 2+ right femoral, posterior tibial and dorsalis pedis pulses; 2+ left femoral, posterior tibial and dorsalis pedis pulses; no subclavian or femoral bruits Neurological: grossly nonfocal, slow shuffling gait and mild resting tremor especially in his right hand, consistent with Parkinson's disease Psych: Normal mood and affect    Wt Readings from Last 3 Encounters:  05/17/18 182 lb 9.6 oz (82.8 kg)  10/04/17 180 lb 6.4 oz (81.8 kg)  05/18/17 186 lb 9.6 oz (84.6 kg)      Studies/Labs Reviewed:   EKG:  EKG is ordered today.  Atrial fibrillation with ventricular paced rhythm.  Recent Labs: No results found for requested labs within last 8760 hours.  Most recent labs from [facility], April 14, 2018  Total cholesterol 117, triglycerides 85, LDL 71, HDL 33, glucose 138, hemoglobin 10.4, Hgb A1c 7.3%, creatinine 1.84,  potassium 4.2, normal liver function tests  ASSESSMENT:    1. Complete heart block ([place])   2. Pacemaker   3. NSVT (nonsustained ventricular tachycardia) ([place])   4. Permanent atrial fibrillation   5. Long term (current) use of anticoagulants   6. Coronary artery disease involving coronary bypass graft of native heart without angina pectoris   7. Chronic diastolic heart failure ([place])   8. Dyslipidemia   9. Controlled type 2 diabetes mellitus with stage 3 chronic kidney disease, without long-term current use of insulin ([place])   10. CKD (chronic kidney disease) stage 3, GFR 30-59 ml/min ([facility])      PLAN:  In order of problems listed above:  1. CHB: Although he does not have 100% ventricular pacing, he is frequently pacemaker dependent when checked in the office. 2. PPM: Normal device function, he prefers lower rate limit at 60 bpm.  Has an MRI conditional system.  Can have MRI scans, but needs appropriate device reprogramming. 3. NSVT: None has been seen since his last device check.  He is on beta-blockers.  He did not tolerate  treatment with mexiletine in the past. 4. AFib: permanent atrial fibrillation. CHADSVasc 6 (age 64, CAD, CHF, HTN, DM).   5. Eliquis: Tolerating anticoagulation without bleeding complications.  He does not have a history of stroke  or embolic events.  Eliquis dose adjusted for age and renal function. 6. CAD s/p CABG: Remains asymptomatic.  Not receiving aspirin due to concomitant anticoagulation.  On statin. 7. CHF: Clinically euvolemic.  Functional status difficult to assess because he is so sedentary. Orthostatic hypotension has not been a complaint since we reduced his diuretic dose. 8. HLP on statin. Excellent recent lipid profile. Low HDL is a chronic refractory problem. Labs from Dr. Elyse Hsu. 9. DM: Acceptable glycemic control with A1c 7.3% (per his endocrinologist note, target under 8%, not a candidate for aggressive glycemic control) 10. CKD stage 3: On adjusted dose of anticoagulant.  Slight worsening of kidney function since his last previous labs.   Medication Adjustments/Labs and Tests Ordered: Current medicines are reviewed at length with the patient today.  Concerns regarding medicines are outlined above.  Medication changes, Labs and Tests ordered today are listed in the Patient Instructions below. Patient Instructions  Medication Instructions:  Dr Sallyanne Kuster recommends that you continue on your current medications as directed. Please refer to the Current Medication list given to you today.  If you need a refill on your cardiac medications before your next appointment, please call your pharmacy.   Testing/Procedures: Remote monitoring is used to monitor your Pacemaker or ICD from home. This monitoring reduces the number of office visits required to check your device to one time per year. It allows [place] to keep an eye on the functioning of your device to ensure it is working properly. You are scheduled for a device check from home on Tuesday, March 3rd, 2020. You may send your  transmission at any time that day. If you have a wireless device, the transmission will be sent automatically. After your physician reviews your transmission, you will receive a postcard with your next transmission date.  To improve our patient care and to more adequately follow your device, [facility] has decided, as a practice, to start following each patient four times a year with your home monitor. This means that you may experience a remote appointment that is close to an in-office appointment with your physician. Your insurance will apply at the same rate as other remote monitoring transmissions.  Follow-Up: At [facility], you and your health needs are our priority.  As part of our continuing mission to provide you with exceptional heart care, we have created designated Provider Care Teams.  These Care Teams include your primary Cardiologist (physician) and Advanced Practice Providers (APPs -  Physician Assistants and Nurse Practitioners) who all work together to provide you with the care you need, when you need it. You will need a follow up appointment in 12 months.  Please call our office 2 months in advance to schedule this appointment.  You may see [name], MD or one of the following Advanced Practice Providers on your designated Care Team: [place], [place] . Fabian Sharp, PA-C . You will receive a reminder letter in the mail two months in advance. If you don't receive a letter, please call our office to schedule the follow-up appointment.    Signed, [name], MD  05/17/2018 9:40 AM    [facility] [place], [place], [place]  [address] Phone: [phone]; Fax: [phone]

## 2018-05-19 DIAGNOSIS — H0012 Chalazion right lower eyelid: Secondary | ICD-10-CM | POA: Diagnosis not present

## 2018-05-29 ENCOUNTER — Other Ambulatory Visit: Payer: Self-pay | Admitting: Cardiovascular Disease

## 2018-05-29 NOTE — Telephone Encounter (Signed)
Rx has been sent to the pharmacy electronically. ° °

## 2018-06-03 ENCOUNTER — Other Ambulatory Visit: Payer: Self-pay | Admitting: Cardiovascular Disease

## 2018-06-05 NOTE — Telephone Encounter (Signed)
Please review for refill. Thanks!  

## 2018-06-07 LAB — CUP PACEART INCLINIC DEVICE CHECK
Date Time Interrogation Session: 20200129160709
Implantable Lead Implant Date: 20080804
Implantable Lead Location: 753859
Implantable Lead Location: 753860
Implantable Lead Model: 5076
Implantable Lead Model: 5076
Implantable Pulse Generator Implant Date: 20180926
MDC IDC LEAD IMPLANT DT: 20080804

## 2018-06-30 ENCOUNTER — Other Ambulatory Visit: Payer: Self-pay | Admitting: Cardiovascular Disease

## 2018-07-03 NOTE — Telephone Encounter (Signed)
Rx(s) sent to pharmacy electronically.  

## 2018-07-10 DIAGNOSIS — E1165 Type 2 diabetes mellitus with hyperglycemia: Secondary | ICD-10-CM | POA: Diagnosis not present

## 2018-07-10 DIAGNOSIS — C61 Malignant neoplasm of prostate: Secondary | ICD-10-CM | POA: Diagnosis not present

## 2018-07-10 DIAGNOSIS — E782 Mixed hyperlipidemia: Secondary | ICD-10-CM | POA: Diagnosis not present

## 2018-07-10 DIAGNOSIS — D649 Anemia, unspecified: Secondary | ICD-10-CM | POA: Diagnosis not present

## 2018-07-11 ENCOUNTER — Ambulatory Visit (INDEPENDENT_AMBULATORY_CARE_PROVIDER_SITE_OTHER): Payer: Medicare HMO | Admitting: *Deleted

## 2018-07-11 DIAGNOSIS — I442 Atrioventricular block, complete: Secondary | ICD-10-CM

## 2018-07-12 LAB — CUP PACEART REMOTE DEVICE CHECK
Battery Remaining Longevity: 121 mo
Brady Statistic AP VP Percent: 0 %
Brady Statistic AP VS Percent: 0 %
Brady Statistic AS VP Percent: 95.43 %
Brady Statistic AS VS Percent: 4.57 %
Brady Statistic RA Percent Paced: 0 %
Brady Statistic RV Percent Paced: 95.43 %
Date Time Interrogation Session: 20200303055245
Implantable Lead Implant Date: 20080804
Implantable Lead Implant Date: 20080804
Implantable Lead Location: 753859
Implantable Lead Location: 753860
Implantable Lead Model: 5076
Implantable Lead Model: 5076
Implantable Pulse Generator Implant Date: 20180926
Lead Channel Impedance Value: 304 Ohm
Lead Channel Impedance Value: 342 Ohm
Lead Channel Impedance Value: 342 Ohm
Lead Channel Pacing Threshold Amplitude: 0.875 V
Lead Channel Pacing Threshold Pulse Width: 0.4 ms
Lead Channel Sensing Intrinsic Amplitude: 10.125 mV
Lead Channel Sensing Intrinsic Amplitude: 10.125 mV
Lead Channel Setting Pacing Amplitude: 2.5 V
Lead Channel Setting Sensing Sensitivity: 4 mV
MDC IDC MSMT BATTERY VOLTAGE: 3.02 V
MDC IDC MSMT LEADCHNL RA IMPEDANCE VALUE: 418 Ohm
MDC IDC SET LEADCHNL RV PACING PULSEWIDTH: 0.4 ms

## 2018-07-18 DIAGNOSIS — Z7984 Long term (current) use of oral hypoglycemic drugs: Secondary | ICD-10-CM | POA: Diagnosis not present

## 2018-07-18 DIAGNOSIS — E1122 Type 2 diabetes mellitus with diabetic chronic kidney disease: Secondary | ICD-10-CM | POA: Diagnosis not present

## 2018-07-18 DIAGNOSIS — Z951 Presence of aortocoronary bypass graft: Secondary | ICD-10-CM | POA: Diagnosis not present

## 2018-07-18 DIAGNOSIS — D649 Anemia, unspecified: Secondary | ICD-10-CM | POA: Diagnosis not present

## 2018-07-18 DIAGNOSIS — E1165 Type 2 diabetes mellitus with hyperglycemia: Secondary | ICD-10-CM | POA: Diagnosis not present

## 2018-07-18 DIAGNOSIS — N183 Chronic kidney disease, stage 3 (moderate): Secondary | ICD-10-CM | POA: Diagnosis not present

## 2018-07-18 DIAGNOSIS — Z950 Presence of cardiac pacemaker: Secondary | ICD-10-CM | POA: Diagnosis not present

## 2018-07-18 DIAGNOSIS — I2510 Atherosclerotic heart disease of native coronary artery without angina pectoris: Secondary | ICD-10-CM | POA: Diagnosis not present

## 2018-07-18 DIAGNOSIS — E782 Mixed hyperlipidemia: Secondary | ICD-10-CM | POA: Diagnosis not present

## 2018-07-18 NOTE — Progress Notes (Signed)
Remote pacemaker transmission.   

## 2018-08-29 ENCOUNTER — Telehealth: Payer: Self-pay | Admitting: Diagnostic Neuroimaging

## 2018-08-29 NOTE — Telephone Encounter (Signed)
Called wife and advised her that we need to schedule patient for a video visit, last seen May 2019. I advised her Dr Leta Baptist would most likely not make medication changes without update on patient. She stated they cannot do video and her only family member is unavailable due to his medical issues. I recommended a telephone visit with Dr Leta Baptist. She consented to tele visit. Updated EMR. He is scheduled for tomorrow, 11 am. She  verbalized understanding, appreciation.

## 2018-08-29 NOTE — Telephone Encounter (Signed)
Pt wife has called to inform pt is having hallucinations and she wonders if pt needs an increase in his carbidopa-levodopa (SINEMET IR) 25-100 MG tablet.  Wife is asking for a call to discuss

## 2018-08-30 ENCOUNTER — Ambulatory Visit (INDEPENDENT_AMBULATORY_CARE_PROVIDER_SITE_OTHER): Payer: Medicare HMO | Admitting: Diagnostic Neuroimaging

## 2018-08-30 ENCOUNTER — Other Ambulatory Visit: Payer: Self-pay

## 2018-08-30 DIAGNOSIS — R269 Unspecified abnormalities of gait and mobility: Secondary | ICD-10-CM

## 2018-08-30 DIAGNOSIS — G20 Parkinson's disease: Secondary | ICD-10-CM | POA: Diagnosis not present

## 2018-08-30 DIAGNOSIS — E1142 Type 2 diabetes mellitus with diabetic polyneuropathy: Secondary | ICD-10-CM | POA: Diagnosis not present

## 2018-08-30 MED ORDER — CARBIDOPA-LEVODOPA 25-100 MG PO TABS: 1.0000 | ORAL_TABLET | Freq: Three times a day (TID) | ORAL | 4 refills | Status: DC

## 2018-08-30 NOTE — Progress Notes (Signed)
     Virtual Visit via Telephone Note  I connected with@ on 08/30/18 at 11:00 AM EDT by telephone and verified that I am speaking with the correct person using two identifiers. Inez Catalina and [employer] on the phone. Patient is at their home. I am at the office.   I discussed the limitations, risks, security and privacy concerns of performing an evaluation and management service by telephone and the availability of in person appointments. I also discussed with the patient that there may be a patient responsible charge related to this service. The patient expressed understanding and agreed to proceed. Patient is at home and I am at the office.   History of Present Illness:  - memory loss; confusion progressing - confused about his location; thinks he is still working - tolerating meds - no falls; using cane    Observations/Objective:  - BP reading 125/79, Weight 169 lb (home readings)   Assessment and Plan:  PARKINSON'S DISEASE (established problem, progressed) - use rollator walker or cane as needed - continue physical therapy exercises - continue carbidopa-levodopa 1 tab three times a day (10am, 5pm, 10pm) - encouraged mild stretching and physical activity  Meds ordered this encounter  Medications  . carbidopa-levodopa (SINEMET IR) 25-100 MG tablet    Sig: Take 1 tablet by mouth 3 (three) times daily.    Dispense:  270 tablet    Refill:  4    DIABETIC NEUROPATHY (established problem, stable) - continue diabetes control per PCP - continue balance exercises   Follow Up Instructions:  - Return in about 1 year (around 08/30/2019).    I discussed the assessment and treatment plan with the patient. The patient was provided an opportunity to ask questions and all were answered. The patient agreed with the plan and demonstrated an understanding of the instructions.   The patient was advised to call back or seek an in-person evaluation if the symptoms worsen or if the condition  fails to improve as anticipated.  I provided 15 minutes of non-face-to-face time during this encounter.    [name], MD 7/32/2025, 42:70 AM Certified in Neurology, Neurophysiology and Neuroimaging  [facility] [address], [place], [place] [address] [phone]

## 2018-09-25 DIAGNOSIS — L570 Actinic keratosis: Secondary | ICD-10-CM | POA: Diagnosis not present

## 2018-09-25 DIAGNOSIS — C4442 Squamous cell carcinoma of skin of scalp and neck: Secondary | ICD-10-CM | POA: Diagnosis not present

## 2018-10-09 ENCOUNTER — Ambulatory Visit (INDEPENDENT_AMBULATORY_CARE_PROVIDER_SITE_OTHER): Payer: Medicare HMO | Admitting: *Deleted

## 2018-10-09 DIAGNOSIS — I442 Atrioventricular block, complete: Secondary | ICD-10-CM | POA: Diagnosis not present

## 2018-10-10 LAB — CUP PACEART REMOTE DEVICE CHECK
Battery Remaining Longevity: 119 mo
Battery Voltage: 3.01 V
Brady Statistic AP VP Percent: 0 %
Brady Statistic AP VS Percent: 0 %
Brady Statistic AS VP Percent: 95.38 %
Brady Statistic AS VS Percent: 4.62 %
Brady Statistic RA Percent Paced: 0 %
Brady Statistic RV Percent Paced: 95.38 %
Date Time Interrogation Session: 20200601053951
Implantable Lead Implant Date: 20080804
Implantable Lead Implant Date: 20080804
Implantable Lead Location: 753859
Implantable Lead Location: 753860
Implantable Lead Model: 5076
Implantable Lead Model: 5076
Implantable Pulse Generator Implant Date: 20180926
Lead Channel Impedance Value: 304 Ohm
Lead Channel Impedance Value: 342 Ohm
Lead Channel Impedance Value: 361 Ohm
Lead Channel Impedance Value: 418 Ohm
Lead Channel Pacing Threshold Amplitude: 0.875 V
Lead Channel Pacing Threshold Pulse Width: 0.4 ms
Lead Channel Sensing Intrinsic Amplitude: 10.125 mV
Lead Channel Sensing Intrinsic Amplitude: 10.125 mV
Lead Channel Setting Pacing Amplitude: 2.5 V
Lead Channel Setting Pacing Pulse Width: 0.4 ms
Lead Channel Setting Sensing Sensitivity: 4 mV

## 2018-10-11 ENCOUNTER — Ambulatory Visit: Payer: Medicare HMO | Admitting: Family Medicine

## 2018-10-11 ENCOUNTER — Ambulatory Visit: Payer: Medicare HMO | Admitting: Adult Health

## 2018-10-11 ENCOUNTER — Other Ambulatory Visit: Payer: Self-pay | Admitting: Cardiovascular Disease

## 2018-10-16 ENCOUNTER — Encounter: Payer: Self-pay | Admitting: Cardiology

## 2018-10-16 DIAGNOSIS — D649 Anemia, unspecified: Secondary | ICD-10-CM | POA: Diagnosis not present

## 2018-10-16 DIAGNOSIS — N183 Chronic kidney disease, stage 3 (moderate): Secondary | ICD-10-CM | POA: Diagnosis not present

## 2018-10-16 DIAGNOSIS — E1165 Type 2 diabetes mellitus with hyperglycemia: Secondary | ICD-10-CM | POA: Diagnosis not present

## 2018-10-16 DIAGNOSIS — E782 Mixed hyperlipidemia: Secondary | ICD-10-CM | POA: Diagnosis not present

## 2018-10-16 NOTE — Progress Notes (Signed)
Remote pacemaker transmission.   

## 2018-10-17 ENCOUNTER — Other Ambulatory Visit: Payer: Self-pay

## 2018-10-17 ENCOUNTER — Emergency Department (HOSPITAL_COMMUNITY)
Admission: EM | Admit: 2018-10-17 | Discharge: 2018-10-17 | Disposition: A | Discharge status: Home / Self Care | Payer: Medicare HMO | Attending: Emergency Medicine | Admitting: Emergency Medicine

## 2018-10-17 ENCOUNTER — Encounter (HOSPITAL_COMMUNITY): Payer: Self-pay | Admitting: Emergency Medicine

## 2018-10-17 DIAGNOSIS — E559 Vitamin D deficiency, unspecified: Secondary | ICD-10-CM | POA: Diagnosis not present

## 2018-10-17 DIAGNOSIS — E1122 Type 2 diabetes mellitus with diabetic chronic kidney disease: Secondary | ICD-10-CM | POA: Diagnosis not present

## 2018-10-17 DIAGNOSIS — Z7901 Long term (current) use of anticoagulants: Secondary | ICD-10-CM | POA: Diagnosis not present

## 2018-10-17 DIAGNOSIS — C61 Malignant neoplasm of prostate: Secondary | ICD-10-CM | POA: Diagnosis not present

## 2018-10-17 DIAGNOSIS — E119 Type 2 diabetes mellitus without complications: Secondary | ICD-10-CM | POA: Diagnosis not present

## 2018-10-17 DIAGNOSIS — Z87891 Personal history of nicotine dependence: Secondary | ICD-10-CM | POA: Insufficient documentation

## 2018-10-17 DIAGNOSIS — Z7984 Long term (current) use of oral hypoglycemic drugs: Secondary | ICD-10-CM | POA: Diagnosis not present

## 2018-10-17 DIAGNOSIS — R7989 Other specified abnormal findings of blood chemistry: Secondary | ICD-10-CM | POA: Diagnosis present

## 2018-10-17 DIAGNOSIS — E782 Mixed hyperlipidemia: Secondary | ICD-10-CM | POA: Diagnosis not present

## 2018-10-17 DIAGNOSIS — I5032 Chronic diastolic (congestive) heart failure: Secondary | ICD-10-CM | POA: Diagnosis not present

## 2018-10-17 DIAGNOSIS — I130 Hypertensive heart and chronic kidney disease with heart failure and stage 1 through stage 4 chronic kidney disease, or unspecified chronic kidney disease: Secondary | ICD-10-CM | POA: Insufficient documentation

## 2018-10-17 DIAGNOSIS — Z79899 Other long term (current) drug therapy: Secondary | ICD-10-CM | POA: Diagnosis not present

## 2018-10-17 DIAGNOSIS — N183 Chronic kidney disease, stage 3 (moderate): Secondary | ICD-10-CM | POA: Diagnosis not present

## 2018-10-17 DIAGNOSIS — Z950 Presence of cardiac pacemaker: Secondary | ICD-10-CM | POA: Diagnosis not present

## 2018-10-17 DIAGNOSIS — Z0000 Encounter for general adult medical examination without abnormal findings: Secondary | ICD-10-CM | POA: Diagnosis not present

## 2018-10-17 DIAGNOSIS — D649 Anemia, unspecified: Secondary | ICD-10-CM | POA: Diagnosis not present

## 2018-10-17 DIAGNOSIS — N179 Acute kidney failure, unspecified: Secondary | ICD-10-CM | POA: Diagnosis not present

## 2018-10-17 LAB — COMPREHENSIVE METABOLIC PANEL
ALT: 8 U/L (ref 0–44)
AST: 16 U/L (ref 15–41)
Albumin: 3.5 g/dL (ref 3.5–5.0)
Alkaline Phosphatase: 82 U/L (ref 38–126)
Anion gap: 10 (ref 5–15)
BUN: 51 mg/dL — ABNORMAL HIGH (ref 8–23)
CO2: 27 mmol/L (ref 22–32)
Calcium: 9.3 mg/dL (ref 8.9–10.3)
Chloride: 105 mmol/L (ref 98–111)
Creatinine, Ser: 2.12 mg/dL — ABNORMAL HIGH (ref 0.61–1.24)
GFR calc Af Amer: 30 mL/min — ABNORMAL LOW (ref >60)
GFR calc non Af Amer: 26 mL/min — ABNORMAL LOW (ref >60)
Glucose, Bld: 133 mg/dL — ABNORMAL HIGH (ref 70–99)
Potassium: 4.4 mmol/L (ref 3.5–5.1)
Sodium: 142 mmol/L (ref 135–145)
Total Bilirubin: 0.5 mg/dL (ref 0.3–1.2)
Total Protein: 7.1 g/dL (ref 6.5–8.1)

## 2018-10-17 LAB — CBC
HCT: 33.4 % — ABNORMAL LOW (ref 39.0–52.0)
Hemoglobin: 10.5 g/dL — ABNORMAL LOW (ref 13.0–17.0)
MCH: 34.2 pg — ABNORMAL HIGH (ref 26.0–34.0)
MCHC: 31.4 g/dL (ref 30.0–36.0)
MCV: 108.8 fL — ABNORMAL HIGH (ref 80.0–100.0)
Platelets: 195 10*3/uL (ref 150–400)
RBC: 3.07 MIL/uL — ABNORMAL LOW (ref 4.22–5.81)
RDW: 15.3 % (ref 11.5–15.5)
WBC: 6.3 10*3/uL (ref 4.0–10.5)
nRBC: 0 % (ref 0.0–0.2)

## 2018-10-17 LAB — URINALYSIS, ROUTINE W REFLEX MICROSCOPIC
Bilirubin Urine: NEGATIVE
Glucose, UA: NEGATIVE mg/dL
Hgb urine dipstick: NEGATIVE
Ketones, ur: NEGATIVE mg/dL
Leukocytes,Ua: NEGATIVE
Nitrite: NEGATIVE
Protein, ur: NEGATIVE mg/dL
Specific Gravity, Urine: 1.012 (ref 1.005–1.030)
pH: 5 (ref 5.0–8.0)

## 2018-10-17 LAB — LIPASE, BLOOD: Lipase: 162 U/L — ABNORMAL HIGH (ref 11–51)

## 2018-10-17 MED ORDER — SODIUM CHLORIDE 0.9% FLUSH
3.0000 mL | Freq: Once | INTRAVENOUS | Status: AC
  Administered 2018-10-17: 3 mL via INTRAVENOUS

## 2018-10-17 MED ORDER — FUROSEMIDE 40 MG PO TABS: 20.0000 mg | ORAL_TABLET | Freq: Every day | ORAL | 3 refills | Status: DC

## 2018-10-17 MED ORDER — SODIUM CHLORIDE 0.9 % IV BOLUS (SEPSIS)
500.0000 mL | Freq: Once | INTRAVENOUS | Status: AC
  Administered 2018-10-17: 17:00:00 500 mL via INTRAVENOUS

## 2018-10-17 MED ORDER — SODIUM CHLORIDE 0.9 % IV SOLN: 1000.0000 mL | INTRAVENOUS | Status: DC

## 2018-10-17 NOTE — ED Notes (Signed)
[name] Powell, wife, call for updates [phone]

## 2018-10-17 NOTE — ED Notes (Signed)
Pt urinated prior to scan and no residual noted with bladder scan.

## 2018-10-17 NOTE — ED Provider Notes (Addendum)
[facility] EMERGENCY DEPARTMENT Provider Note   CSN: [id number] Arrival date & time: 10/17/18  1544    History   Chief Complaint Chief Complaint  Patient presents with  . Abnormal Lab    HPI [name] Powell is a 85 y.o. male.     HPI Patient presents to the emergency room for evaluation of abnormal laboratory tests.  According to nursing notes patient went to his doctor's office.  Blood tests revealed elevated liver enzymes.  he was instructed to come to the ED for further evaluation.  Patient himself denies any complaints.  He states he is not having trouble with any vomiting diarrhea or abdominal pain.  He denies any weakness.  He denies any swelling.  He denies any chest pain or shortness of breath.  Patient is not sure of all the details of why he is here.  He does know he had abnormal blood tests.  Trying to get additional information from his spouse.  Past Medical History:  Diagnosis Date  . Atrial fibrillation ([place])   . Cancer ([place])   . CHB (complete heart block) ([place])   . Coronary artery disease   . Diabetes mellitus   . Dyslipidemia   . H/O prostate cancer   . Hypertension   . New onset atrial flutter, persistent 07/04/2014  . Pacemaker 12/12/2006   Medtronic adapta  . Pleural effusion, left   . Pneumonia 09/2015  . Prostate cancer ([place])   . 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 Ventricular tachycardia (paroxysmal) ([place]) 03/28/2014    Patient Active Problem List   Diagnosis Date Noted  . Pacemaker battery depletion 02/02/2017  . CHF (congestive heart failure) ([place]) 07/27/2016  . Weakness generalized 07/27/2016  . Generalized weakness 07/27/2016  . Long term current use of anticoagulant 01/16/2016  . Acute respiratory failure with hypoxia ([place]) 09/15/2015  . Acute on chronic diastolic congestive heart failure ([place]) 09/15/2015  . CKD (chronic kidney disease), stage III ([place]) 09/15/2015  .  Atrial fibrillation ([place])   . Pleural effusion, left   . Acute diastolic (congestive) heart failure ([place])   . Community acquired pneumonia 02/19/2015  . CAP (community acquired pneumonia) 02/19/2015  . Atrial flutter ([place]) 01/20/2015  . New onset atrial flutter, persistent 07/04/2014  . Ventricular tachycardia (paroxysmal) ([place]) 03/28/2014  . Chronic diastolic congestive heart failure ([place]) 01/02/2014  . Pacemaker dependent - Medtronic 01/10/2013  . CAD s/p CABG 2003 01/10/2013  . Sinus arrest 01/10/2013  . CHB (complete heart block) ([place]) 01/10/2013  . DM2 (diabetes mellitus, type 2) ([place]) 01/10/2013  . Dyslipidemia 01/10/2013  . HTN (hypertension) 01/10/2013    Past Surgical History:  Procedure Laterality Date  . CORONARY ARTERY BYPASS GRAFT  09/04/2001   LIMA to LAD,SVG to diagonal,SVG to ramus intermedius,SVG to PDA  . NM MYOVIEW LTD  01/09/2010   no ischemia  . PACEMAKER INSERTION  12/12/2006   Medtronic adapta  . PPM GENERATOR CHANGEOUT N/A 02/02/2017   Procedure: PPM GENERATOR CHANGEOUT - DUAL CHAMBER;  Surgeon: [name], MD;  Location: [facility] CV LAB;  Service: Cardiovascular;  Laterality: N/A;  . PROSTATE SURGERY  2001   cancer  . TONSILLECTOMY          Home Medications    Prior to Admission medications   Medication Sig Start Date End Date Taking? Authorizing Provider  atorvastatin (LIPITOR) 20 MG tablet Take 20 mg by mouth daily.  Yes [provider]  carbidopa-levodopa (SINEMET IR) 25-100 MG tablet Take 1 tablet by mouth 3 (three) times daily. 08/30/18  Yes Penumalli, Earlean Polka, MD  Cholecalciferol (VITAMIN D PO) Take 10,000 Units by mouth every Friday. On Friday    Yes [provider]  ELIQUIS 2.5 MG TABS tablet TAKE 1 TABLET TWICE DAILY Patient taking differently: Take 2.5 mg by mouth 2 (two) times daily.  06/05/18  Yes Croitoru, Mihai, MD  glimepiride (AMARYL) 4 MG tablet Take 4 mg by mouth daily before breakfast.   Yes [provider]  JANUVIA 50 MG tablet Take 1 tablet by mouth every evening.  05/27/15  Yes [provider]  metFORMIN (GLUCOPHAGE-XR) 500 MG 24 hr tablet Take 500 mg by mouth daily with breakfast.  02/10/15  Yes [provider]  metoprolol tartrate (LOPRESSOR) 50 MG tablet TAKE 2 TABLETS IN THE MORNING  AND TAKE 1 TABLET IN THE EVENING Patient taking differently: Take 50-100 mg by mouth See admin instructions. Take 2 tablets by mouth in the morning and 1 tablet in the evening 05/29/18  Yes Croitoru, Mihai, MD  Multiple Vitamins-Minerals (PRESERVISION AREDS) CAPS Take 1 capsule by mouth 2 (two) times daily.   Yes [provider]  polyethylene glycol (MIRALAX / GLYCOLAX) packet Take 17 g by mouth daily. To prevent constipation    Yes [provider]  potassium chloride (K-DUR,KLOR-CON) 10 MEQ tablet TAKE 1 TABLET (10 MEQ TOTAL) BY MOUTH DAILY. 04/10/18  Yes Croitoru, Mihai, MD  furosemide (LASIX) 40 MG tablet Take 0.5 tablets (20 mg total) by mouth daily. 10/17/18   Dorie Rank, MD    Family History Family History  Problem Relation Age of Onset  . Heart disease Mother     Social History Social History   Tobacco Use  . Smoking status: Former Smoker    Types: Cigarettes, Cigars    Last attempt to quit: 05/14/1962    Years since quitting: 56.4  . Smokeless tobacco: Never Used  Substance Use Topics  . Alcohol use: Yes    Comment: seldom  . Drug use: No     Allergies   Lanoxin [digoxin] and Mexitil [mexiletine]   Review of Systems Review of Systems  All other systems reviewed and are negative.    Physical Exam Updated Vital Signs BP (!) 148/65   Pulse 60   Temp 98.5 F (36.9 C) (Oral)   Resp 18   SpO2 99%   Physical Exam Vitals signs and nursing note reviewed.  Constitutional:      General: He is not in acute distress.    Appearance: He is well-developed.     Comments: Elderly, frail  HENT:     Head: Normocephalic and atraumatic.      Right Ear: External ear normal.     Left Ear: External ear normal.  Eyes:     General: No scleral icterus.       Right eye: No discharge.        Left eye: No discharge.     Conjunctiva/sclera: Conjunctivae normal.  Neck:     Musculoskeletal: Neck supple.     Trachea: No tracheal deviation.  Cardiovascular:     Rate and Rhythm: Normal rate and regular rhythm.  Pulmonary:     Effort: Pulmonary effort is normal. No respiratory distress.     Breath sounds: Normal breath sounds. No stridor. No wheezing or rales.  Abdominal:     General: Bowel sounds are normal. There is no  distension.     Palpations: Abdomen is soft.     Tenderness: There is no abdominal tenderness. There is no guarding or rebound.  Musculoskeletal:        General: No tenderness.  Skin:    General: Skin is warm and dry.     Findings: No rash.  Neurological:     Mental Status: He is alert.     Cranial Nerves: No cranial nerve deficit (no facial droop, extraocular movements intact, no slurred speech).     Sensory: No sensory deficit.     Motor: No abnormal muscle tone or seizure activity.     Coordination: Coordination normal.      ED Treatments / Results  Labs (all labs ordered are listed, but only abnormal results are displayed) Labs Reviewed  LIPASE, BLOOD - Abnormal; Notable for the following components:      Result Value   Lipase 162 (*)    All other components within normal limits  COMPREHENSIVE METABOLIC PANEL - Abnormal; Notable for the following components:   Glucose, Bld 133 (*)    BUN 51 (*)    Creatinine, Ser 2.12 (*)    GFR calc non Af Amer 26 (*)    GFR calc Af Amer 30 (*)    All other components within normal limits  CBC - Abnormal; Notable for the following components:   RBC 3.07 (*)    Hemoglobin 10.5 (*)    HCT 33.4 (*)    MCV 108.8 (*)    MCH 34.2 (*)    All other components within normal limits  URINALYSIS, ROUTINE W REFLEX MICROSCOPIC - Abnormal; Notable for the following  components:   Color, Urine STRAW (*)    All other components within normal limits    EKG None  Radiology No results found.  Procedures Procedures (including critical care time)  Medications Ordered in ED Medications  sodium chloride 0.9 % bolus 500 mL (0 mLs Intravenous Stopped 10/17/18 1743)    Followed by  0.9 %  sodium chloride infusion (1,000 mLs Intravenous Not Given 10/17/18 1843)  sodium chloride flush (NS) 0.9 % injection 3 mL (3 mLs Intravenous Given 10/17/18 1626)     Initial Impression / Assessment and Plan / ED Course  I have reviewed the triage vital signs and the nursing notes.  Pertinent labs & imaging results that were available during my care of the patient were reviewed by me and considered in my medical decision making (see chart for details).  Clinical Course as of Oct 16 1852  Tue Oct 17, 2018  1645 Wife is at the bedside.  Additional information provided by wife.  The concern was actually his CR.  They felt he needed to have IV fluids.   [JK]  1646 Previous creatinine was 1.65 1 year ago.   Notable increase to 2.12 today   [JK]  1739 Anemia noted but stable compared to previous values   [JK]  1853 I spoke to the patient's son and updated him on the evaluation today.   [JK]    Clinical Course User Index [JK] Dorie Rank, MD     Patient presented to the ED for evaluation of acute kidney injury.  Patient does have an elevated BUN and creatinine of 51 and 2.12.  Patient denies any symptoms.  I suspect this could be a combination of decreased p.o. intake as well as his medications which do include Diovan and furosemide .   Patient is not having any vomiting or  diarrhea.  He is otherwise stable and asymptomatic.  I do not think needs to be admitted to the hospital.  Plan on having him stop the Diovan and the furosemide in half.  I will have him follow-up with his cardiologist and/or primary care doctor have his laboratory test rechecked.  Final Clinical  Impressions(s) / ED Diagnoses   Final diagnoses:  AKI (acute kidney injury) [facility])    ED Discharge Orders         Ordered    furosemide (LASIX) 40 MG tablet  Daily     10/17/18 1853           Dorie Rank, MD 10/17/18 1854 Corrected dragon dictation error   Dorie Rank, MD 10/19/18 [phone]

## 2018-10-17 NOTE — ED Triage Notes (Signed)
Pt states he was at his MD office today and was sent here for elevated liver enzymes. Pt denies feeling poorly, denies abd pain, n/v.

## 2018-10-17 NOTE — Discharge Instructions (Signed)
Stop taking your Valsartan medication.  Cut back your lasix to 20 mg daily instead of 40 mg.  Follow up with Dr Drema Dallas or Dr. Sallyanne Kuster to have your kidney function rechecked.   They will also want to recheck the lipase enzyme.  Return as needed for weakness, vomiting, worsening symptoms

## 2018-10-17 NOTE — ED Notes (Addendum)
Dr. Tomi Bamberger requesting wife to come back, Sort RN sending her back.

## 2018-10-17 NOTE — ED Notes (Signed)
Wife reports the levels elevated were creatinine and GFR.  GFR resulted 27  Creat 2.26

## 2018-10-18 ENCOUNTER — Telehealth: Payer: Self-pay | Admitting: Cardiovascular Disease

## 2018-10-18 NOTE — Progress Notes (Signed)
Thanks

## 2018-10-18 NOTE — Telephone Encounter (Signed)
Thank you. I agree with the med changes. No plan to restart the valsartan. Please let us know if he develops edema or shortness of breath after these changes. MCr

## 2018-10-18 NOTE — Telephone Encounter (Signed)
Wife updated and voiced understanding.

## 2018-10-18 NOTE — Telephone Encounter (Signed)
New Message    Pt c/o medication issue:  1. Name of Medication:  Valsartan  Took him off completley  Furosemide 40mg  and now 20mg    2. How are you currently taking this medication (dosage and times per day)? Valsartan, no  Furosemide 20 mg 1x daily   3. Are you having a reaction (difficulty breathing--STAT)? No  4. What is your medication issue? Pts wife is calling and wanted to let Dr Sallyanne Kuster know of the medication changes the ER dcotor made yesterday for the pt

## 2018-10-18 NOTE — Telephone Encounter (Signed)
Spoke with Wife. She report pt had blood work done by [facility] [facility] and was instructed to go to ER as they were concerned about his kidney function. Wife reported pt was administered IV fluids and ER MD stopped his valsartan and decreased lasix from 40 mg to 20. Wife state she wanted to make Dr. Loletha Grayer aware.

## 2018-10-19 DIAGNOSIS — E1122 Type 2 diabetes mellitus with diabetic chronic kidney disease: Secondary | ICD-10-CM | POA: Diagnosis not present

## 2018-10-19 DIAGNOSIS — D649 Anemia, unspecified: Secondary | ICD-10-CM | POA: Diagnosis not present

## 2018-10-19 DIAGNOSIS — C61 Malignant neoplasm of prostate: Secondary | ICD-10-CM | POA: Diagnosis not present

## 2018-10-19 DIAGNOSIS — I129 Hypertensive chronic kidney disease with stage 1 through stage 4 chronic kidney disease, or unspecified chronic kidney disease: Secondary | ICD-10-CM | POA: Diagnosis not present

## 2018-10-19 DIAGNOSIS — I2510 Atherosclerotic heart disease of native coronary artery without angina pectoris: Secondary | ICD-10-CM | POA: Diagnosis not present

## 2018-10-19 DIAGNOSIS — E559 Vitamin D deficiency, unspecified: Secondary | ICD-10-CM | POA: Diagnosis not present

## 2018-10-19 DIAGNOSIS — N183 Chronic kidney disease, stage 3 (moderate): Secondary | ICD-10-CM | POA: Diagnosis not present

## 2018-10-19 DIAGNOSIS — E782 Mixed hyperlipidemia: Secondary | ICD-10-CM | POA: Diagnosis not present

## 2018-10-19 DIAGNOSIS — E538 Deficiency of other specified B group vitamins: Secondary | ICD-10-CM | POA: Diagnosis not present

## 2018-10-23 ENCOUNTER — Encounter: Payer: Self-pay | Admitting: *Deleted

## 2018-10-23 ENCOUNTER — Inpatient Hospital Stay (HOSPITAL_COMMUNITY)
Admission: EM | Admit: 2018-10-23 | Discharge: 2018-10-25 | DRG: 291 | Disposition: A | Discharge status: Home Health | Payer: Medicare HMO | Attending: Internal Medicine | Admitting: Internal Medicine

## 2018-10-23 ENCOUNTER — Other Ambulatory Visit: Payer: Self-pay

## 2018-10-23 ENCOUNTER — Observation Stay (HOSPITAL_COMMUNITY): Payer: Medicare HMO

## 2018-10-23 DIAGNOSIS — I130 Hypertensive heart and chronic kidney disease with heart failure and stage 1 through stage 4 chronic kidney disease, or unspecified chronic kidney disease: Secondary | ICD-10-CM | POA: Diagnosis not present

## 2018-10-23 DIAGNOSIS — I4821 Permanent atrial fibrillation: Secondary | ICD-10-CM | POA: Diagnosis not present

## 2018-10-23 DIAGNOSIS — Z8249 Family history of ischemic heart disease and other diseases of the circulatory system: Secondary | ICD-10-CM | POA: Diagnosis not present

## 2018-10-23 DIAGNOSIS — I2510 Atherosclerotic heart disease of native coronary artery without angina pectoris: Secondary | ICD-10-CM | POA: Diagnosis not present

## 2018-10-23 DIAGNOSIS — E1122 Type 2 diabetes mellitus with diabetic chronic kidney disease: Secondary | ICD-10-CM | POA: Diagnosis not present

## 2018-10-23 DIAGNOSIS — Z7901 Long term (current) use of anticoagulants: Secondary | ICD-10-CM | POA: Diagnosis not present

## 2018-10-23 DIAGNOSIS — Z20828 Contact with and (suspected) exposure to other viral communicable diseases: Secondary | ICD-10-CM | POA: Diagnosis not present

## 2018-10-23 DIAGNOSIS — N183 Chronic kidney disease, stage 3 unspecified: Secondary | ICD-10-CM | POA: Diagnosis present

## 2018-10-23 DIAGNOSIS — D631 Anemia in chronic kidney disease: Secondary | ICD-10-CM | POA: Diagnosis present

## 2018-10-23 DIAGNOSIS — I442 Atrioventricular block, complete: Secondary | ICD-10-CM | POA: Diagnosis not present

## 2018-10-23 DIAGNOSIS — E785 Hyperlipidemia, unspecified: Secondary | ICD-10-CM | POA: Diagnosis present

## 2018-10-23 DIAGNOSIS — M7989 Other specified soft tissue disorders: Secondary | ICD-10-CM | POA: Diagnosis not present

## 2018-10-23 DIAGNOSIS — I4891 Unspecified atrial fibrillation: Secondary | ICD-10-CM | POA: Diagnosis present

## 2018-10-23 DIAGNOSIS — N400 Enlarged prostate without lower urinary tract symptoms: Secondary | ICD-10-CM | POA: Diagnosis not present

## 2018-10-23 DIAGNOSIS — Z951 Presence of aortocoronary bypass graft: Secondary | ICD-10-CM

## 2018-10-23 DIAGNOSIS — N184 Chronic kidney disease, stage 4 (severe): Secondary | ICD-10-CM | POA: Diagnosis present

## 2018-10-23 DIAGNOSIS — Z888 Allergy status to other drugs, medicaments and biological substances status: Secondary | ICD-10-CM | POA: Diagnosis not present

## 2018-10-23 DIAGNOSIS — D649 Anemia, unspecified: Secondary | ICD-10-CM

## 2018-10-23 DIAGNOSIS — Z8546 Personal history of malignant neoplasm of prostate: Secondary | ICD-10-CM | POA: Diagnosis not present

## 2018-10-23 DIAGNOSIS — I5032 Chronic diastolic (congestive) heart failure: Secondary | ICD-10-CM | POA: Diagnosis not present

## 2018-10-23 DIAGNOSIS — Z7984 Long term (current) use of oral hypoglycemic drugs: Secondary | ICD-10-CM

## 2018-10-23 DIAGNOSIS — Z950 Presence of cardiac pacemaker: Secondary | ICD-10-CM

## 2018-10-23 DIAGNOSIS — G20 Parkinson's disease: Secondary | ICD-10-CM | POA: Diagnosis present

## 2018-10-23 DIAGNOSIS — E119 Type 2 diabetes mellitus without complications: Secondary | ICD-10-CM

## 2018-10-23 DIAGNOSIS — Z87891 Personal history of nicotine dependence: Secondary | ICD-10-CM

## 2018-10-23 DIAGNOSIS — E559 Vitamin D deficiency, unspecified: Secondary | ICD-10-CM | POA: Diagnosis not present

## 2018-10-23 DIAGNOSIS — E782 Mixed hyperlipidemia: Secondary | ICD-10-CM | POA: Diagnosis not present

## 2018-10-23 DIAGNOSIS — I5033 Acute on chronic diastolic (congestive) heart failure: Secondary | ICD-10-CM

## 2018-10-23 DIAGNOSIS — R5383 Other fatigue: Secondary | ICD-10-CM | POA: Diagnosis not present

## 2018-10-23 LAB — BRAIN NATRIURETIC PEPTIDE: B Natriuretic Peptide: 906.8 pg/mL — ABNORMAL HIGH (ref 0.0–100.0)

## 2018-10-23 LAB — CBC
HCT: 28.4 % — ABNORMAL LOW (ref 39.0–52.0)
Hemoglobin: 8.6 g/dL — ABNORMAL LOW (ref 13.0–17.0)
MCH: 33.7 pg (ref 26.0–34.0)
MCHC: 30.3 g/dL (ref 30.0–36.0)
MCV: 111.4 fL — ABNORMAL HIGH (ref 80.0–100.0)
Platelets: 158 10*3/uL (ref 150–400)
RBC: 2.55 MIL/uL — ABNORMAL LOW (ref 4.22–5.81)
RDW: 15.4 % (ref 11.5–15.5)
WBC: 8.1 10*3/uL (ref 4.0–10.5)
nRBC: 0 % (ref 0.0–0.2)

## 2018-10-23 LAB — TYPE AND SCREEN
ABO/RH(D): O NEG
Antibody Screen: NEGATIVE

## 2018-10-23 LAB — COMPREHENSIVE METABOLIC PANEL
ALT: 9 U/L (ref 0–44)
AST: 18 U/L (ref 15–41)
Albumin: 3.3 g/dL — ABNORMAL LOW (ref 3.5–5.0)
Alkaline Phosphatase: 90 U/L (ref 38–126)
Anion gap: 10 (ref 5–15)
BUN: 52 mg/dL — ABNORMAL HIGH (ref 8–23)
CO2: 24 mmol/L (ref 22–32)
Calcium: 8.8 mg/dL — ABNORMAL LOW (ref 8.9–10.3)
Chloride: 108 mmol/L (ref 98–111)
Creatinine, Ser: 1.98 mg/dL — ABNORMAL HIGH (ref 0.61–1.24)
GFR calc Af Amer: 33 mL/min — ABNORMAL LOW (ref >60)
GFR calc non Af Amer: 28 mL/min — ABNORMAL LOW (ref >60)
Glucose, Bld: 91 mg/dL (ref 70–99)
Potassium: 5 mmol/L (ref 3.5–5.1)
Sodium: 142 mmol/L (ref 135–145)
Total Bilirubin: 0.9 mg/dL (ref 0.3–1.2)
Total Protein: 6.8 g/dL (ref 6.5–8.1)

## 2018-10-23 LAB — VITAMIN B12: Vitamin B-12: 1487 pg/mL — ABNORMAL HIGH (ref 180–914)

## 2018-10-23 LAB — FERRITIN: Ferritin: 130 ng/mL (ref 24–336)

## 2018-10-23 LAB — ABO/RH: ABO/RH(D): O NEG

## 2018-10-23 LAB — GLUCOSE, CAPILLARY: Glucose-Capillary: 95 mg/dL (ref 70–99)

## 2018-10-23 LAB — POC OCCULT BLOOD, ED: Fecal Occult Bld: NEGATIVE

## 2018-10-23 LAB — FOLATE: Folate: 19.3 ng/mL (ref >5.9)

## 2018-10-23 MED ORDER — SODIUM CHLORIDE 0.9% FLUSH
3.0000 mL | Freq: Two times a day (BID) | INTRAVENOUS | Status: DC
  Administered 2018-10-23 – 2018-10-25 (×4): 3 mL via INTRAVENOUS

## 2018-10-23 MED ORDER — METOPROLOL TARTRATE 100 MG PO TABS
100.0000 mg | ORAL_TABLET | Freq: Every day | ORAL | Status: DC
  Administered 2018-10-24 – 2018-10-25 (×2): 100 mg via ORAL
  Filled 2018-10-23 (×2): qty 1

## 2018-10-23 MED ORDER — INSULIN ASPART 100 UNIT/ML ~~LOC~~ SOLN
0.0000 [IU] | Freq: Three times a day (TID) | SUBCUTANEOUS | Status: DC
  Administered 2018-10-24 – 2018-10-25 (×2): 1 [IU] via SUBCUTANEOUS

## 2018-10-23 MED ORDER — FUROSEMIDE 10 MG/ML IJ SOLN
40.0000 mg | Freq: Once | INTRAMUSCULAR | Status: AC
  Administered 2018-10-23: 40 mg via INTRAVENOUS
  Filled 2018-10-23: qty 4

## 2018-10-23 MED ORDER — CARBIDOPA-LEVODOPA 25-100 MG PO TABS
1.0000 | ORAL_TABLET | Freq: Three times a day (TID) | ORAL | Status: DC
  Administered 2018-10-23 – 2018-10-25 (×5): 1 via ORAL
  Filled 2018-10-23 (×5): qty 1

## 2018-10-23 MED ORDER — ATORVASTATIN CALCIUM 10 MG PO TABS
20.0000 mg | ORAL_TABLET | Freq: Every day | ORAL | Status: DC
  Administered 2018-10-24 – 2018-10-25 (×2): 20 mg via ORAL
  Filled 2018-10-23 (×2): qty 2

## 2018-10-23 MED ORDER — ACETAMINOPHEN 650 MG RE SUPP: 650.0000 mg | Freq: Four times a day (QID) | RECTAL | Status: DC | PRN

## 2018-10-23 MED ORDER — ACETAMINOPHEN 325 MG PO TABS
650.0000 mg | ORAL_TABLET | Freq: Four times a day (QID) | ORAL | Status: DC | PRN
  Administered 2018-10-25: 650 mg via ORAL
  Filled 2018-10-23: qty 2

## 2018-10-23 MED ORDER — METOPROLOL TARTRATE 25 MG PO TABS: 50.0000 mg | ORAL_TABLET | ORAL | Status: DC

## 2018-10-23 MED ORDER — METOPROLOL TARTRATE 50 MG PO TABS
50.0000 mg | ORAL_TABLET | Freq: Every day | ORAL | Status: DC
  Administered 2018-10-23 – 2018-10-24 (×2): 50 mg via ORAL
  Filled 2018-10-23 (×2): qty 1

## 2018-10-23 MED ORDER — POTASSIUM CHLORIDE CRYS ER 20 MEQ PO TBCR
10.0000 meq | EXTENDED_RELEASE_TABLET | Freq: Every day | ORAL | Status: DC
  Administered 2018-10-24 – 2018-10-25 (×2): 10 meq via ORAL
  Filled 2018-10-23 (×2): qty 1

## 2018-10-23 NOTE — ED Triage Notes (Signed)
Pt here from MD office and was told to come to the hospital for a drop in Hgb to 9.1 and increased kidney function

## 2018-10-23 NOTE — H&P (Signed)
History and Physical    [name]:[id number] DOB: 1922/07/30 DOA: 10/23/2018  PCP: [name], MD  Patient coming from: PCP office  I have personally briefly reviewed patient's old medical records in [place]  Chief Complaint: Low hemoglobin  HPI: [name] Powell is a 85 y.o. male with medical history significant for CAD s/p CABG, CHB s/p PPM, chronic diastolic CHF, Atrial Fibrillation on Eliquis, T2DM, HLD, CKD Stage 3/4, and Parkinson's disease who presents to the ED from PCP office for evaluation of decreased hemoglobin.  Wife is at bedside and provides majority of history.  Patient was just seen in the ED on 10/16/2018 for dehydration and AKI.  His creatinine was 2.12 compared to 1.58 on 07/10/2018.  He was given half a liter of normal saline, advised to stop his valsartan, and reduce his home oral Lasix from 40 mg daily to 20 mg daily which she has been doing.  Wife states that since reducing home Lasix, he has been having slow progressive weight gain with increasing 2 pounds over 1 day.  Due to the increasing weight (last weight 176 pounds at home, reported dry weight is 170) and swelling in his legs, she resumed his previous Lasix 40 mg orally yesterday.  Presented to the PCP office today 10/23/2018 for evaluation.  There are called after labs reportedly showed a decrease in hemoglobin to 9.1 and was advised to come to the ED for further evaluation.  Patient is on Eliquis for anticoagulation which she has been taking regularly.  He denies any obvious bleeding including epistaxis, hemoptysis, hematemesis, hematochezia, melena, hematuria.  He denies any associated chest pain, palpitations, dyspnea, abdominal pain, dysuria.  ED Course:  Initial vitals showed BP 150/67, pulse 62, RR 21, temp 97.8 Fahrenheit, SPO2 97% on room air.  Labs are notable for hemoglobin 8.6 (10.5 on 10/17/2018), WBC 8.1, MCV 111.4, platelets 158,000, potassium 5.0, bicarb 24, BUN 52, creatinine  1.98, GFR 28.  FOBT was negative.  Novel coronavirus test was obtained and in process.  The hospitalist service was consulted to admit for further evaluation and management.  Review of Systems: All systems reviewed and are negative except as documented in history of present illness above.   Past Medical History:  Diagnosis Date   Anemia    chronic   Atrial fibrillation (HCC)    Cancer (HCC)    CHB (complete heart block) ([place])    Coronary artery disease    Diabetes mellitus    Dyslipidemia    H/O prostate cancer    Hypertension    New onset atrial flutter, persistent 07/04/2014   Pacemaker 12/12/2006   Medtronic adapta   Pleural effusion, left    Pneumonia 09/2015   Prostate cancer ([place])    S/P CABG x 4 09/04/2001   LIMA to LAD,SVG to diagonal,SVG to ramus intermedius,SVG to PDA   Ventricular tachycardia (paroxysmal) ([place]) 03/28/2014    Past Surgical History:  Procedure Laterality Date   CORONARY ARTERY BYPASS GRAFT  09/04/2001   LIMA to LAD,SVG to diagonal,SVG to ramus intermedius,SVG to PDA   NM MYOVIEW LTD  01/09/2010   no ischemia   PACEMAKER INSERTION  12/12/2006   Medtronic adapta   PPM GENERATOR CHANGEOUT N/A 02/02/2017   Procedure: PPM GENERATOR CHANGEOUT - DUAL CHAMBER;  Surgeon: [name], MD;  Location: [facility];  Service: Cardiovascular;  Laterality: N/A;   PROSTATE SURGERY  2001   cancer   TONSILLECTOMY      Social  History:  reports that he quit smoking about 56 years ago. His smoking use included cigarettes and cigars. He has never used smokeless tobacco. He reports current alcohol use. He reports that he does not use drugs.  Allergies  Allergen Reactions   Lanoxin [Digoxin] Rash    Eyelid rash   Mexitil [Mexiletine] Other (See Comments)    unknown    Family History  Problem Relation Age of Onset   Heart disease Mother    Heart attack Mother    Other Father        sepsis   Other Sister        brain tumor      Prior to Admission medications   Medication Sig Start Date End Date Taking? Authorizing Provider  atorvastatin (LIPITOR) 20 MG tablet Take 20 mg by mouth daily.   Yes [provider]  carbidopa-levodopa (SINEMET IR) 25-100 MG tablet Take 1 tablet by mouth 3 (three) times daily. 08/30/18  Yes Penumalli, Earlean Polka, MD  Cholecalciferol (VITAMIN D PO) Take 10,000 Units by mouth every Friday. On Friday    Yes [provider]  ELIQUIS 2.5 MG TABS tablet TAKE 1 TABLET TWICE DAILY Patient taking differently: Take 2.5 mg by mouth 2 (two) times daily.  06/05/18  Yes Croitoru, Mihai, MD  furosemide (LASIX) 40 MG tablet Take 0.5 tablets (20 mg total) by mouth daily. 10/17/18  Yes Dorie Rank, MD  glimepiride (AMARYL) 4 MG tablet Take 4 mg by mouth daily before breakfast.   Yes [provider]  JANUVIA 50 MG tablet Take 1 tablet by mouth every evening.  05/27/15  Yes [provider]  metFORMIN (GLUCOPHAGE-XR) 500 MG 24 hr tablet Take 500 mg by mouth daily with breakfast.  02/10/15  Yes [provider]  metoprolol tartrate (LOPRESSOR) 50 MG tablet TAKE 2 TABLETS IN THE MORNING  AND TAKE 1 TABLET IN THE EVENING Patient taking differently: Take 50-100 mg by mouth See admin instructions. Take 2 tablets by mouth in the morning and 1 tablet in the evening 05/29/18  Yes Croitoru, Mihai, MD  Multiple Vitamins-Minerals (PRESERVISION AREDS) CAPS Take 1 capsule by mouth 2 (two) times daily.   Yes [provider]  polyethylene glycol (MIRALAX / GLYCOLAX) packet Take 17 g by mouth daily. To prevent constipation    Yes [provider]  potassium chloride (K-DUR,KLOR-CON) 10 MEQ tablet TAKE 1 TABLET (10 MEQ TOTAL) BY MOUTH DAILY. 04/10/18  Yes Croitoru, Dani Gobble, MD    Physical Exam: Vitals:   10/23/18 1800 10/23/18 1815 10/23/18 1830 10/23/18 1845  BP: (!) 152/72 (!) 144/77 (!) 152/82 (!) 124/53  Pulse: 62 (!) 58 61 (!) 58  Resp: (!) 21 19 (!) 21 (!) 22  Temp:       TempSrc:      SpO2: 99% 97% 100% 93%  Weight:      Height:        Constitutional: Elderly man resting supine in bed, NAD, calm, comfortable Eyes: PERRL, EOMI lids and conjunctivae normal without pallor ENMT: Mucous membranes are moist. Posterior pharynx clear of any exudate or lesions. Neck: normal, supple, no masses. Respiratory: Bibasilar inspiratory crackles. Normal respiratory effort. No accessory muscle use.  Cardiovascular: Regular rate and rhythm, no murmurs / rubs / gallops.  Trace bilateral lower extremity edema. 2+ pedal pulses.  PPM in place left chest wall. Abdomen: no tenderness, no masses palpated. No hepatosplenomegaly. Bowel sounds positive.  Musculoskeletal: no clubbing / cyanosis. No joint deformity upper and lower  extremities. Good ROM while lying in bed, no contractures.  Minimal cogwheel rigidity upper extremities. Skin: no rashes, lesions, ulcers. No induration Neurologic: Masked feces.  CN 2-12 grossly intact. Sensation intact. Strength 5/5 in all 4.  Psychiatric: Normal judgment and insight. Alert and oriented x 3. Normal mood, flat affect.     Labs on Admission: I have personally reviewed following labs and imaging studies  CBC: Recent Labs  Lab 10/17/18 1556 10/23/18 1632  WBC 6.3 8.1  HGB 10.5* 8.6*  HCT 33.4* 28.4*  MCV 108.8* 111.4*  PLT 195 474   Basic Metabolic Panel: Recent Labs  Lab 10/17/18 1556 10/23/18 1632  NA 142 142  K 4.4 5.0  CL 105 108  CO2 27 24  GLUCOSE 133* 91  BUN 51* 52*  CREATININE 2.12* 1.98*  CALCIUM 9.3 8.8*   GFR: Estimated Creatinine Clearance: 22.1 mL/min (A) (by C-G formula based on SCr of 1.98 mg/dL (H)). Liver Function Tests: Recent Labs  Lab 10/17/18 1556 10/23/18 1632  AST 16 18  ALT 8 9  ALKPHOS 82 90  BILITOT 0.5 0.9  PROT 7.1 6.8  ALBUMIN 3.5 3.3*   Recent Labs  Lab 10/17/18 1556  LIPASE 162*   No results for input(s): AMMONIA in the last 168 hours. Coagulation Profile: No results for  input(s): INR, PROTIME in the last 168 hours. Cardiac Enzymes: No results for input(s): CKTOTAL, CKMB, CKMBINDEX, TROPONINI in the last 168 hours. BNP (last 3 results) No results for input(s): PROBNP in the last 8760 hours. HbA1C: No results for input(s): HGBA1C in the last 72 hours. CBG: No results for input(s): GLUCAP in the last 168 hours. Lipid Profile: No results for input(s): CHOL, HDL, LDLCALC, TRIG, CHOLHDL, LDLDIRECT in the last 72 hours. Thyroid Function Tests: No results for input(s): TSH, T4TOTAL, FREET4, T3FREE, THYROIDAB in the last 72 hours. Anemia Panel: No results for input(s): VITAMINB12, FOLATE, FERRITIN, TIBC, IRON, RETICCTPCT in the last 72 hours. Urine analysis:    Component Value Date/Time   COLORURINE STRAW (A) 10/17/2018 1715   APPEARANCEUR CLEAR 10/17/2018 1715   LABSPEC 1.012 10/17/2018 1715   PHURINE 5.0 10/17/2018 1715   GLUCOSEU NEGATIVE 10/17/2018 1715   HGBUR NEGATIVE 10/17/2018 1715   BILIRUBINUR NEGATIVE 10/17/2018 [place] 10/17/2018 1715   PROTEINUR NEGATIVE 10/17/2018 1715   UROBILINOGEN 1.0 02/19/2015 2354   NITRITE NEGATIVE 10/17/2018 [place] 10/17/2018 1715    Radiological Exams on Admission: No results found.  EKG: Pending  Assessment/Plan Principal Problem:   Acute on chronic diastolic congestive heart failure (HCC) Active Problems:   CAD s/p CABG 2003   CHB (complete heart block) (HCC)   DM2 (diabetes mellitus, type 2) (HCC)   Dyslipidemia   Atrial fibrillation (HCC)   CKD (chronic kidney disease), stage III ([place])   Symptomatic anemia  PHENIX VANDERMEULEN is a 85 y.o. male with medical history significant for CAD s/p CABG, CHB s/p PPM, chronic diastolic CHF, Atrial Fibrillation on Eliquis, T2DM, HLD, CKD Stage 3/4, and Parkinson's disease who is admitted with acute on chronic diastolic heart failure and anemia.   Acute on chronic diastolic CHF: Patient with increased weight gain and  peripheral edema over the last week since reducing home Lasix to half the previous dose. -Give IV Lasix 40 mg once, monitor response -Portable chest x-ray ordered-shows vascular congestion and stable left retrocardiac opacity -Monitor daily weights, strict I/O's -Continue home Lopressor  Acute on chronic anemia: Hemoglobin decreased to 8.6  compared to 10.51-week prior to admission.  No obvious bleeding, FOBT is negative.  Will hold home Eliquis tonight and monitor hemoglobin.  MCV is elevated.  Vitamin B12 and folate levels pending.  Recommend transfusing for hemoglobin <8.0 or active bleeding.  Acute on CKD stage 3/4: Creatinine slightly improved after discontinuing valsartan 1 week ago.  Giving IV Lasix for volume overload, continue to monitor renal function closely.  Atrial fibrillation CHB s/p PPM: Rate currently controlled.  Holding Eliquis as above.  Continue home Lopressor.  Discussed risks versus benefits of continued anticoagulation, may need to consider holding further anticoagulation depending on stability of hemoglobin and given his age.  CAD s/p CABG: Chronic and stable without active chest pain.  Continue Lopressor and atorvastatin.  Type 2 diabetes: Holding home metformin, Amaryl, and Januvia while in hospital. -Continue SSI  Hyperlipidemia: -Continue atorvastatin  Parkinson's disease: -Continue home Sinemet IR -PT/OT eval   DVT prophylaxis: SCDs Code Status: Full code, confirmed with patient and wife Family Communication: Discussed with wife at bedside Disposition Plan: Pending clinical progress Consults called: None Admission status: Observation   Zada Finders MD [facility]  If 7PM-7AM, please contact night-coverage [email or web address]  10/23/2018, 7:23 PM

## 2018-10-23 NOTE — ED Notes (Signed)
Attempted to call report to [id number]

## 2018-10-23 NOTE — ED Provider Notes (Signed)
[facility] EMERGENCY DEPARTMENT Provider Note   CSN: [id number] Arrival date & time: 10/23/18  1546    History   Chief Complaint No chief complaint on file.   HPI [name] Powell is a 85 y.o. male.     HPI  85 year old male presents with abnormal lab work.  History is obtained from the wife.  The patient has had progressively worsening weakness for several weeks but was much more prominent over this past weekend.  Has generalized fatigue.  Had labs drawn today and was called by the PCP office because the hemoglobin was down to 9.1 and the creatinine was increased.  Patient reports no obvious GI bleeding.  No bleeding from anywhere else.  No shortness of breath.  Recently had his valsartan removed and his Lasix cut in half.  Wife noticed that his weight has started to increase and so she gave him a full dose Lasix this morning.  Past Medical History:  Diagnosis Date  . Anemia    chronic  . Atrial fibrillation ([place])   . Cancer ([place])   . CHB (complete heart block) ([place])   . Coronary artery disease   . Diabetes mellitus   . Dyslipidemia   . H/O prostate cancer   . Hypertension   . New onset atrial flutter, persistent 07/04/2014  . Pacemaker 12/12/2006   Medtronic adapta  . Pleural effusion, left   . Pneumonia 09/2015  . Prostate cancer ([place])   . 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 Ventricular tachycardia (paroxysmal) ([place]) 03/28/2014    Patient Active Problem List   Diagnosis Date Noted  . Symptomatic anemia 10/23/2018  . Pacemaker battery depletion 02/02/2017  . CHF (congestive heart failure) ([place]) 07/27/2016  . Weakness generalized 07/27/2016  . Generalized weakness 07/27/2016  . Long term current use of anticoagulant 01/16/2016  . Acute respiratory failure with hypoxia ([place]) 09/15/2015  . Acute on chronic diastolic congestive heart failure ([place]) 09/15/2015  . CKD (chronic kidney disease),  stage III ([place]) 09/15/2015  . Atrial fibrillation ([place])   . Pleural effusion, left   . Acute diastolic (congestive) heart failure ([place])   . Community acquired pneumonia 02/19/2015  . CAP (community acquired pneumonia) 02/19/2015  . Atrial flutter ([place]) 01/20/2015  . New onset atrial flutter, persistent 07/04/2014  . Ventricular tachycardia (paroxysmal) ([place]) 03/28/2014  . Chronic diastolic congestive heart failure ([place]) 01/02/2014  . Pacemaker dependent - Medtronic 01/10/2013  . CAD s/p CABG 2003 01/10/2013  . Sinus arrest 01/10/2013  . CHB (complete heart block) ([place]) 01/10/2013  . DM2 (diabetes mellitus, type 2) ([place]) 01/10/2013  . Dyslipidemia 01/10/2013  . HTN (hypertension) 01/10/2013    Past Surgical History:  Procedure Laterality Date  . CORONARY ARTERY BYPASS GRAFT  09/04/2001   LIMA to LAD,SVG to diagonal,SVG to ramus intermedius,SVG to PDA  . NM MYOVIEW LTD  01/09/2010   no ischemia  . PACEMAKER INSERTION  12/12/2006   Medtronic adapta  . PPM GENERATOR CHANGEOUT N/A 02/02/2017   Procedure: PPM GENERATOR CHANGEOUT - DUAL CHAMBER;  Surgeon: [name], MD;  Location: [facility];  Service: Cardiovascular;  Laterality: N/A;  . PROSTATE SURGERY  2001   cancer  . TONSILLECTOMY          Home Medications    Prior to Admission medications   Medication Sig Start Date End Date Taking? Authorizing Provider  atorvastatin (LIPITOR) 20 MG tablet Take  20 mg by mouth daily.   Yes [provider]  carbidopa-levodopa (SINEMET IR) 25-100 MG tablet Take 1 tablet by mouth 3 (three) times daily. 08/30/18  Yes Penumalli, Earlean Polka, MD  Cholecalciferol (VITAMIN D PO) Take 10,000 Units by mouth every Friday. On Friday    Yes [provider]  ELIQUIS 2.5 MG TABS tablet TAKE 1 TABLET TWICE DAILY Patient taking differently: Take 2.5 mg by mouth 2 (two) times daily.  06/05/18  Yes Croitoru, Mihai, MD  furosemide (LASIX) 40 MG tablet Take 0.5 tablets (20 mg total) by  mouth daily. 10/17/18  Yes Dorie Rank, MD  glimepiride (AMARYL) 4 MG tablet Take 4 mg by mouth daily before breakfast.   Yes [provider]  JANUVIA 50 MG tablet Take 1 tablet by mouth every evening.  05/27/15  Yes [provider]  metFORMIN (GLUCOPHAGE-XR) 500 MG 24 hr tablet Take 500 mg by mouth daily with breakfast.  02/10/15  Yes [provider]  metoprolol tartrate (LOPRESSOR) 50 MG tablet TAKE 2 TABLETS IN THE MORNING  AND TAKE 1 TABLET IN THE EVENING Patient taking differently: Take 50-100 mg by mouth See admin instructions. Take 2 tablets by mouth in the morning and 1 tablet in the evening 05/29/18  Yes Croitoru, Mihai, MD  Multiple Vitamins-Minerals (PRESERVISION AREDS) CAPS Take 1 capsule by mouth 2 (two) times daily.   Yes [provider]  polyethylene glycol (MIRALAX / GLYCOLAX) packet Take 17 g by mouth daily. To prevent constipation    Yes [provider]  potassium chloride (K-DUR,KLOR-CON) 10 MEQ tablet TAKE 1 TABLET (10 MEQ TOTAL) BY MOUTH DAILY. 04/10/18  Yes Croitoru, Mihai, MD    Family History Family History  Problem Relation Age of Onset  . Heart disease Mother   . Heart attack Mother   . Other Father        sepsis  . Other Sister        brain tumor    Social History Social History   Tobacco Use  . Smoking status: Former Smoker    Types: Cigarettes, Cigars    Quit date: 05/14/1962    Years since quitting: 56.4  . Smokeless tobacco: Never Used  Substance Use Topics  . Alcohol use: Yes    Comment: seldom  . Drug use: No     Allergies   Lanoxin [digoxin] and Mexitil [mexiletine]   Review of Systems Review of Systems  Constitutional: Positive for fatigue. Negative for fever.  Respiratory: Negative for cough and shortness of breath.   Cardiovascular: Positive for leg swelling. Negative for chest pain.  Gastrointestinal: Negative for abdominal pain and blood in stool.  All other systems reviewed and are negative.     Physical Exam Updated Vital Signs BP (!) 143/72 (BP Location: Right Arm)   Pulse 60   Temp 97.9 F (36.6 C) (Oral)   Resp 17   Ht 5\' 8"  (1.727 m)   Wt 83.3 kg   SpO2 95%   BMI 27.92 kg/m   Physical Exam Vitals signs and nursing note reviewed.  Constitutional:      Appearance: He is well-developed.  HENT:     Head: Normocephalic and atraumatic.     Right Ear: External ear normal.     Left Ear: External ear normal.     Nose: Nose normal.  Eyes:     General:        Right eye: No discharge.        Left eye:  No discharge.  Neck:     Musculoskeletal: Neck supple.  Cardiovascular:     Rate and Rhythm: Normal rate and regular rhythm.     Heart sounds: Normal heart sounds.  Pulmonary:     Effort: Pulmonary effort is normal.     Breath sounds: Normal breath sounds.  Abdominal:     General: There is no distension.     Palpations: Abdomen is soft.     Tenderness: There is no abdominal tenderness.  Musculoskeletal:     Right lower leg: Edema present.     Left lower leg: Edema present.     Comments: Mild bilateral lower extremity pitting edema  Skin:    General: Skin is warm and dry.  Neurological:     Mental Status: He is alert.  Psychiatric:        Mood and Affect: Mood is not anxious.      ED Treatments / Results  Labs (all labs ordered are listed, but only abnormal results are displayed) Labs Reviewed  COMPREHENSIVE METABOLIC PANEL - Abnormal; Notable for the following components:      Result Value   BUN 52 (*)    Creatinine, Ser 1.98 (*)    Calcium 8.8 (*)    Albumin 3.3 (*)    GFR calc non Af Amer 28 (*)    GFR calc Af Amer 33 (*)    All other components within normal limits  CBC - Abnormal; Notable for the following components:   RBC 2.55 (*)    Hemoglobin 8.6 (*)    HCT 28.4 (*)    MCV 111.4 (*)    All other components within normal limits  BRAIN NATRIURETIC PEPTIDE - Abnormal; Notable for the following components:   B Natriuretic Peptide 906.8  (*)    All other components within normal limits  NOVEL CORONAVIRUS, NAA (HOSPITAL ORDER, SEND-OUT TO REF LAB)  FOLATE  GLUCOSE, CAPILLARY  BASIC METABOLIC PANEL  CBC  VITAMIN B12  FERRITIN  POC OCCULT BLOOD, ED  TYPE AND SCREEN  ABO/RH    EKG None  Radiology Portable Chest 1 View  Result Date: 10/23/2018 CLINICAL DATA:  Chronic diastolic CHF EXAM: PORTABLE CHEST 1 VIEW COMPARISON:  07/27/2016 FINDINGS: Left pacer remains in place, unchanged. Prior median sternotomy and CABG. Cardiomegaly with vascular congestion. Left retrocardiac opacity again noted which could reflect atelectasis, infiltrate or effusion. No confluent opacity on the right. No acute bony abnormality. IMPRESSION: Cardiomegaly, vascular congestion. Stable left retrocardiac opacity which could reflect atelectasis, pneumonia, and/or effusion. Electronically Signed   By: Rolm Baptise M.D.   On: 10/23/2018 19:35    Procedures Procedures (including critical care time)  Medications Ordered in ED Medications  sodium chloride flush (NS) 0.9 % injection 3 mL (3 mLs Intravenous Given 10/23/18 2131)  acetaminophen (TYLENOL) tablet 650 mg (has no administration in time range)    Or  acetaminophen (TYLENOL) suppository 650 mg (has no administration in time range)  atorvastatin (LIPITOR) tablet 20 mg (has no administration in time range)  carbidopa-levodopa (SINEMET IR) 25-100 MG per tablet immediate release 1 tablet (1 tablet Oral Given 10/23/18 2130)  potassium chloride SA (K-DUR) CR tablet 10 mEq (has no administration in time range)  insulin aspart (novoLOG) injection 0-9 Units (has no administration in time range)  metoprolol tartrate (LOPRESSOR) tablet 100 mg (has no administration in time range)    And  metoprolol tartrate (LOPRESSOR) tablet 50 mg (50 mg Oral Given 10/23/18 2131)  furosemide (LASIX) injection 40  mg (40 mg Intravenous Given 10/23/18 2028)     Initial Impression / Assessment and Plan / ED Course  I have  reviewed the triage vital signs and the nursing notes.  Pertinent labs & imaging results that were available during my care of the patient were reviewed by me and considered in my medical decision making (see chart for details).        Patient's hemoglobin has slowly worsened.  No clear obvious bleeding source and Hemoccult was negative.  Exam is otherwise fairly benign.  Will admit to the hospitalist service given dropping hemoglobin.  Final Clinical Impressions(s) / ED Diagnoses   Final diagnoses:  Symptomatic anemia    ED Discharge Orders    None       Sherwood Gambler, MD 10/23/18 2322

## 2018-10-24 ENCOUNTER — Encounter (HOSPITAL_COMMUNITY): Payer: Self-pay

## 2018-10-24 DIAGNOSIS — I4821 Permanent atrial fibrillation: Secondary | ICD-10-CM | POA: Diagnosis not present

## 2018-10-24 DIAGNOSIS — D631 Anemia in chronic kidney disease: Secondary | ICD-10-CM | POA: Diagnosis present

## 2018-10-24 DIAGNOSIS — I2510 Atherosclerotic heart disease of native coronary artery without angina pectoris: Secondary | ICD-10-CM | POA: Diagnosis present

## 2018-10-24 DIAGNOSIS — Z7984 Long term (current) use of oral hypoglycemic drugs: Secondary | ICD-10-CM | POA: Diagnosis not present

## 2018-10-24 DIAGNOSIS — Z20828 Contact with and (suspected) exposure to other viral communicable diseases: Secondary | ICD-10-CM | POA: Diagnosis present

## 2018-10-24 DIAGNOSIS — Z951 Presence of aortocoronary bypass graft: Secondary | ICD-10-CM | POA: Diagnosis not present

## 2018-10-24 DIAGNOSIS — Z87891 Personal history of nicotine dependence: Secondary | ICD-10-CM | POA: Diagnosis not present

## 2018-10-24 DIAGNOSIS — E785 Hyperlipidemia, unspecified: Secondary | ICD-10-CM | POA: Diagnosis present

## 2018-10-24 DIAGNOSIS — I4891 Unspecified atrial fibrillation: Secondary | ICD-10-CM | POA: Diagnosis present

## 2018-10-24 DIAGNOSIS — Z950 Presence of cardiac pacemaker: Secondary | ICD-10-CM | POA: Diagnosis not present

## 2018-10-24 DIAGNOSIS — N184 Chronic kidney disease, stage 4 (severe): Secondary | ICD-10-CM | POA: Diagnosis present

## 2018-10-24 DIAGNOSIS — I442 Atrioventricular block, complete: Secondary | ICD-10-CM | POA: Diagnosis present

## 2018-10-24 DIAGNOSIS — I5032 Chronic diastolic (congestive) heart failure: Secondary | ICD-10-CM | POA: Diagnosis present

## 2018-10-24 DIAGNOSIS — Z888 Allergy status to other drugs, medicaments and biological substances status: Secondary | ICD-10-CM | POA: Diagnosis not present

## 2018-10-24 DIAGNOSIS — E1122 Type 2 diabetes mellitus with diabetic chronic kidney disease: Secondary | ICD-10-CM | POA: Diagnosis present

## 2018-10-24 DIAGNOSIS — G20 Parkinson's disease: Secondary | ICD-10-CM | POA: Diagnosis present

## 2018-10-24 DIAGNOSIS — Z8249 Family history of ischemic heart disease and other diseases of the circulatory system: Secondary | ICD-10-CM | POA: Diagnosis not present

## 2018-10-24 DIAGNOSIS — N183 Chronic kidney disease, stage 3 (moderate): Secondary | ICD-10-CM | POA: Diagnosis not present

## 2018-10-24 DIAGNOSIS — I5033 Acute on chronic diastolic (congestive) heart failure: Secondary | ICD-10-CM | POA: Diagnosis present

## 2018-10-24 DIAGNOSIS — I130 Hypertensive heart and chronic kidney disease with heart failure and stage 1 through stage 4 chronic kidney disease, or unspecified chronic kidney disease: Secondary | ICD-10-CM | POA: Diagnosis present

## 2018-10-24 DIAGNOSIS — Z7901 Long term (current) use of anticoagulants: Secondary | ICD-10-CM | POA: Diagnosis not present

## 2018-10-24 DIAGNOSIS — Z8546 Personal history of malignant neoplasm of prostate: Secondary | ICD-10-CM | POA: Diagnosis not present

## 2018-10-24 LAB — GLUCOSE, CAPILLARY
Glucose-Capillary: 83 mg/dL (ref 70–99)
Glucose-Capillary: 112 mg/dL — ABNORMAL HIGH (ref 70–99)
Glucose-Capillary: 145 mg/dL — ABNORMAL HIGH (ref 70–99)
Glucose-Capillary: 128 mg/dL — ABNORMAL HIGH (ref 70–99)

## 2018-10-24 LAB — CBC
HCT: 25.9 % — ABNORMAL LOW (ref 39.0–52.0)
Hemoglobin: 8.3 g/dL — ABNORMAL LOW (ref 13.0–17.0)
MCH: 34.4 pg — ABNORMAL HIGH (ref 26.0–34.0)
MCHC: 32 g/dL (ref 30.0–36.0)
MCV: 107.5 fL — ABNORMAL HIGH (ref 80.0–100.0)
Platelets: 154 10*3/uL (ref 150–400)
RBC: 2.41 MIL/uL — ABNORMAL LOW (ref 4.22–5.81)
RDW: 15.4 % (ref 11.5–15.5)
WBC: 7.7 10*3/uL (ref 4.0–10.5)
nRBC: 0 % (ref 0.0–0.2)

## 2018-10-24 LAB — BASIC METABOLIC PANEL
Anion gap: 10 (ref 5–15)
BUN: 51 mg/dL — ABNORMAL HIGH (ref 8–23)
CO2: 23 mmol/L (ref 22–32)
Calcium: 8.3 mg/dL — ABNORMAL LOW (ref 8.9–10.3)
Chloride: 106 mmol/L (ref 98–111)
Creatinine, Ser: 1.84 mg/dL — ABNORMAL HIGH (ref 0.61–1.24)
GFR calc Af Amer: 36 mL/min — ABNORMAL LOW (ref >60)
GFR calc non Af Amer: 31 mL/min — ABNORMAL LOW (ref >60)
Glucose, Bld: 110 mg/dL — ABNORMAL HIGH (ref 70–99)
Potassium: 4.2 mmol/L (ref 3.5–5.1)
Sodium: 139 mmol/L (ref 135–145)

## 2018-10-24 LAB — NOVEL CORONAVIRUS, NAA (HOSP ORDER, SEND-OUT TO REF LAB; TAT 18-24 HRS): SARS-CoV-2, NAA: NOT DETECTED

## 2018-10-24 MED ORDER — APIXABAN 2.5 MG PO TABS
2.5000 mg | ORAL_TABLET | Freq: Two times a day (BID) | ORAL | Status: DC
  Administered 2018-10-24 – 2018-10-25 (×3): 2.5 mg via ORAL
  Filled 2018-10-24 (×4): qty 1

## 2018-10-24 MED ORDER — FUROSEMIDE 10 MG/ML IJ SOLN
40.0000 mg | Freq: Once | INTRAMUSCULAR | Status: AC
  Administered 2018-10-24: 40 mg via INTRAVENOUS
  Filled 2018-10-24: qty 4

## 2018-10-24 NOTE — TOC Initial Note (Signed)
Transition of Care [facility]) - Initial/Assessment Note    Patient Details  Name: [name] Powell MRN: [id number] Date of Birth: 10/28/1922  [facility]) CM/SW Contact:    Marilu Favre, RN Phone Number: 10/24/2018, 12:21 PM  Clinical Narrative:                  Damaris Schooner to wife Alex Leahy [phone], patient from home with wife. Inez Catalina stated patient recently saw his PCP and home health and [facility] were recommended but she is unsure if PCP arranged home health or DME.  Explained PT to work with her husband here today , once recommendations are made NCM will call wife back.   If home health ordered Inez Catalina wants [facility].  Will continue to follow. Expected Discharge Plan: [place] Barriers to Discharge: Continued Medical Work up   Patient Goals and CMS Choice Patient states their goals for this hospitalization and ongoing recovery are:: to return to home CMS Medicare.gov Compare Post Acute Care list provided to:: Patient Choice offered to / list presented to : Patient, Spouse  Expected Discharge Plan and Services Expected Discharge Plan: [place] Acute Care Choice: Home Health, Durable Medical Equipment Living arrangements for the past 2 months: Single Family Home                                      Prior Living Arrangements/Services Living arrangements for the past 2 months: Single Family Home Lives with:: Spouse Patient language and need for interpreter reviewed:: Yes        Need for Family Participation in Patient Care: Yes (Comment) Care giver support system in place?: Yes (comment) Current home services: DME Criminal Activity/Legal Involvement Pertinent to Current Situation/Hospitalization: No - Comment as needed  Activities of Daily Living Home Assistive Devices/Equipment: None ADL Screening (condition at time of admission) Patient's cognitive ability adequate to safely complete  daily activities?: Yes Is the patient deaf or have difficulty hearing?: No Does the patient have difficulty seeing, even when wearing glasses/contacts?: No Does the patient have difficulty concentrating, remembering, or making decisions?: No Patient able to express need for assistance with ADLs?: Yes Does the patient have difficulty dressing or bathing?: No Independently performs ADLs?: Yes (appropriate for developmental age) Does the patient have difficulty walking or climbing stairs?: Yes Weakness of Legs: None Weakness of Arms/Hands: None  Permission Sought/Granted                  Emotional Assessment Appearance:: Appears stated age            Admission diagnosis:  Chronic diastolic CHF (congestive heart failure) ([place]) [I50.32] Symptomatic anemia [D64.9] Patient Active Problem List   Diagnosis Date Noted  . Symptomatic anemia 10/23/2018  . Pacemaker battery depletion 02/02/2017  . CHF (congestive heart failure) ([place]) 07/27/2016  . Weakness generalized 07/27/2016  . Generalized weakness 07/27/2016  . Long term current use of anticoagulant 01/16/2016  . Acute respiratory failure with hypoxia ([place]) 09/15/2015  . Acute on chronic diastolic congestive heart failure ([place]) 09/15/2015  . CKD (chronic kidney disease), stage III ([place]) 09/15/2015  . Atrial fibrillation ([place])   . Pleural effusion, left   . Acute diastolic (congestive) heart failure ([place])   . Community acquired pneumonia 02/19/2015  . CAP (community acquired pneumonia) 02/19/2015  . Atrial flutter (  [place]) 01/20/2015  . New onset atrial flutter, persistent 07/04/2014  . Ventricular tachycardia (paroxysmal) ([place]) 03/28/2014  . Chronic diastolic congestive heart failure ([place]) 01/02/2014  . Pacemaker dependent - Medtronic 01/10/2013  . CAD s/p CABG 2003 01/10/2013  . Sinus arrest 01/10/2013  . CHB (complete heart block) ([place]) 01/10/2013  . DM2 (diabetes mellitus, type 2) ([place]) 01/10/2013  . Dyslipidemia  01/10/2013  . HTN (hypertension) 01/10/2013   PCP:  [name], MD Pharmacy:   [facility], [place] [address] Phone: [phone] Fax: [phone]  [id number] [address], [place] [address] Phone: [phone] Fax: [phone]     Social Determinants of Health (SDOH) Interventions    Readmission Risk Interventions No flowsheet data found.

## 2018-10-24 NOTE — Progress Notes (Signed)
Patient spoke with wife over phone. Update given.

## 2018-10-24 NOTE — Plan of Care (Signed)

## 2018-10-24 NOTE — Progress Notes (Addendum)
[facility] PROGRESS NOTE  [name] Powell QQI:[id number] DOB: 11-28-1922 DOA: 10/23/2018 PCP: [name], MD  Assessment/Plan:  Acute on chronic diastolic CHF: likely related to decrease in lasix dose. BNP 906, chest xray with vascular congestion. Weight stable. Volume status -567mls. Given IV Lasix 40 mg once at admission. -will repeat lasix 40mg  IV and then plan to resume home dose tomorrow -Monitor daily weights, strict I/O's -Continue home Lopressor   Acute on chronic anemia:Hemoglobin decreased to 8.6 compared to 10.51-week prior to admission. Hg 8.3 this am.   No obvious bleeding, FOBT is negative. Home meds include Eliquis. Macrocytic. Vitamin B12 1487 and folate 19.3. -resume eliquis -serial cbc -Consider transfusing for hemoglobin <8.0 or active bleeding.   Acute on CKD stage 3/4:Creatinine slightly improved after discontinuing valsartan 1 week ago. Creatinine 1.8 this am -hold nephrotoxins as able -monitor urine output -repeat in am   Atrial fibrillation CHB s/p PPM: Rate currently controlled.  Holding Eliquis as above.  -continue BB. -may need to evaluated risk/benefit ratio given #2 of anticoagulation   CAD s/p CABG: no chest pain. Chronic and stable. -Continue Lopressor and atorvastatin.   Type 2 diabetes: Home meds include metformin, Amaryl, and Januvia. Serum glucose 110.  -obtain a1c -Continue SSI   Hyperlipidemia: -Continue atorvastatin   Parkinson's disease: -Continue home Sinemet IR -PT/OT eval -mobilize    Code Status: full Family Communication: wife on phone Disposition Plan: home hopefully tomorrow   Consultants:   Procedures:   Antibiotics:   HPI/Subjective: 85 yo hx chf, afib, cad, DM admitted with acute on chronic heart failure and symptomatic anemia. Provided with IV lasix in ED. Anemia work up. Hg stable today at 8.3.   Objective: Vitals:   10/23/18 2001 10/24/18 0427  BP: (!) 143/72 (!) 132/57  Pulse: 60 (!) 59   Resp: 17 18  Temp: 97.9 F (36.6 C) 98.1 F (36.7 C)  SpO2: 95% 93%    Intake/Output Summary (Last 24 hours) at 10/24/2018 1050 Last data filed at 10/24/2018 9417 Gross per 24 hour  Intake 150 ml  Output 500 ml  Net -350 ml   Filed Weights   10/23/18 1731 10/23/18 2001 10/24/18 0500  Weight: 79.4 kg 83.3 kg 83.3 kg    Exam:  General:  Awake alert no acute distress cooperative Cardiovascular: rrr no mgr no LE edema Respiratory: normal effort BS with fair air movement. Fine crackles in bilateral bases. No wheeze Abdomen: non-distended non-tender +BS no guarding or rebounding Musculoskeletal: joints without swelling/erythema   Data Reviewed: Basic Metabolic Panel: Recent Labs  Lab 10/17/18 1556 10/23/18 1632 10/24/18 0341  NA 142 142 139  K 4.4 5.0 4.2  CL 105 108 106  CO2 27 24 23   GLUCOSE 133* 91 110*  BUN 51* 52* 51*  CREATININE 2.12* 1.98* 1.84*  CALCIUM 9.3 8.8* 8.3*   Liver Function Tests: Recent Labs  Lab 10/17/18 1556 10/23/18 1632  AST 16 18  ALT 8 9  ALKPHOS 82 90  BILITOT 0.5 0.9  PROT 7.1 6.8  ALBUMIN 3.5 3.3*   Recent Labs  Lab 10/17/18 1556  LIPASE 162*   No results for input(s): AMMONIA in the last 168 hours. CBC: Recent Labs  Lab 10/17/18 1556 10/23/18 1632 10/24/18 0341  WBC 6.3 8.1 7.7  HGB 10.5* 8.6* 8.3*  HCT 33.4* 28.4* 25.9*  MCV 108.8* 111.4* 107.5*  PLT 195 158 154   Cardiac Enzymes: No results for input(s): CKTOTAL, CKMB, CKMBINDEX, TROPONINI in the last 168 hours.  BNP (last 3 results) Recent Labs    10/23/18 2118  BNP 906.8*    ProBNP (last 3 results) No results for input(s): PROBNP in the last 8760 hours.  CBG: Recent Labs  Lab 10/23/18 2118 10/24/18 0816  GLUCAP 95 83    No results found for this or any previous visit (from the past 240 hour(s)).   Studies: Portable Chest 1 View  Result Date: 10/23/2018 CLINICAL DATA:  Chronic diastolic CHF EXAM: PORTABLE CHEST 1 VIEW COMPARISON:  07/27/2016  FINDINGS: Left pacer remains in place, unchanged. Prior median sternotomy and CABG. Cardiomegaly with vascular congestion. Left retrocardiac opacity again noted which could reflect atelectasis, infiltrate or effusion. No confluent opacity on the right. No acute bony abnormality. IMPRESSION: Cardiomegaly, vascular congestion. Stable left retrocardiac opacity which could reflect atelectasis, pneumonia, and/or effusion. Electronically Signed   By: Rolm Baptise M.D.   On: 10/23/2018 19:35    Scheduled Meds:  atorvastatin  20 mg Oral Daily   carbidopa-levodopa  1 tablet Oral TID   insulin aspart  0-9 Units Subcutaneous TID WC   metoprolol tartrate  100 mg Oral Daily   And   metoprolol tartrate  50 mg Oral QHS   potassium chloride  10 mEq Oral Daily   sodium chloride flush  3 mL Intravenous Q12H   Continuous Infusions:   Principal Problem:   Acute on chronic diastolic congestive heart failure (HCC) Active Problems:   Symptomatic anemia   CAD s/p CABG 2003   CHB (complete heart block) (HCC)   DM2 (diabetes mellitus, type 2) (HCC)   Atrial fibrillation (HCC)   CKD (chronic kidney disease), stage III ([place])   Dyslipidemia    Time spent: 22 minutes    [facility] M NP  [facility]  If 7PM-7AM, please contact night-coverage at [email or web address], password [facility] 10/24/2018, 10:50 AM  LOS: 0 days

## 2018-10-24 NOTE — Evaluation (Signed)
Physical Therapy Evaluation Patient Details Name: [name] Powell MRN: [id number] DOB: [birth year] Today's Date: 10/24/2018   History of Present Illness  Pt is a 85 y.o. male admitted from PCP's office on 10/23/18 with acute on chronic anemia and CHF. Of note, recent ED visit 10/16/18 with dehydration and AKI. PMH includes CAD, pacemaker, CHF, afib on Eliquis, DM2, CKD 3-4, Parkinson's disease.    Clinical Impression  Pt presents with an overall decrease in functional mobility secondary to above. PTA, pt ambulatory with [facility], lives with wife who provides consistent supervision. Today, pt ambulatory with RW and intermittent min guard for balance; will trial use of rollator next session. Pt would benefit from continued acute PT services to maximize functional mobility and independence prior to d/c with HHPT services.     Follow Up Recommendations Home health PT;Supervision/Assistance - 24 hour    Equipment Recommendations  Other (comment)(rollator)    Recommendations for Other Services       Precautions / Restrictions Precautions Precautions: Fall Restrictions Weight Bearing Restrictions: No      Mobility  Bed Mobility Overal bed mobility: Modified Independent Bed Mobility: Supine to Sit;Sit to Supine           General bed mobility comments: Increased time and effort  Transfers Overall transfer level: Needs assistance Equipment used: Rolling walker (2 wheeled) Transfers: Sit to/from Stand Sit to Stand: Supervision         General transfer comment: Cues for hand placement, but pt initially insistant on pulling on RW with BUEs. After walk, able to perform 3x sit<>stand from EOB to RW with correct hand placement; supervision for safety  Ambulation/Gait Ambulation/Gait assistance: Min guard;Supervision Gait Distance (Feet): 150 Feet Assistive device: Rolling walker (2 wheeled) Gait Pattern/deviations: Step-through pattern;Decreased stride length;Trunk flexed Gait velocity:  Decreased Gait velocity interpretation: 1.31 - 2.62 ft/sec, indicative of limited community ambulator General Gait Details: Slow, intermittently unsteady gait with RW; cues for sequencing and safety (re: maintaining closer proximity, stop moving forward if need to take hand off RW). Pt reports he prefers improved stability of RW than SPC  Stairs Stairs: Yes Stairs assistance: Min guard Stair Management: One rail Left;Alternating pattern;Step to pattern;Forwards Number of Stairs: 10 General stair comments: Ascendeded 10 steps with alternating pattern and LUE support on rail, descended with step-to pattern; min guard for safety  Wheelchair Mobility    Modified Rankin (Stroke Patients Only)       Balance Overall balance assessment: Needs assistance Sitting-balance support: Feet supported;Single extremity supported Sitting balance-Leahy Scale: Good     Standing balance support: Bilateral upper extremity supported;No upper extremity supported Standing balance-Leahy Scale: Fair Standing balance comment: Can static stand without UE support                             Pertinent Vitals/Pain Pain Assessment: No/denies pain    Home Living Family/patient expects to be discharged to:: Private residence Living Arrangements: Spouse/significant other Available Help at Discharge: Family;Available 24 hours/day Type of Home: House Home Access: Stairs to enter Entrance Stairs-Rails: Right Entrance Stairs-Number of Steps: 3 Home Layout: Two level;Laundry or work area in basement;Able to live on main level with bedroom/bathroom Home Equipment: [name] - single point;Walker - 2 wheels      Prior Function     Gait / Transfers Assistance Needed: Intermittent use of SPC for functional mobility  ADL's / Homemaking Assistance Needed: Wife assists with dressing and household tasks as needed  Hand Dominance   Dominant Hand: Right    Extremity/Trunk Assessment   Upper  Extremity Assessment Upper Extremity Assessment: Generalized weakness    Lower Extremity Assessment Lower Extremity Assessment: Generalized weakness    Cervical / Trunk Assessment Cervical / Trunk Assessment: Kyphotic  Communication      Cognition Arousal/Alertness: Awake/alert Behavior During Therapy: WFL for tasks assessed/performed Overall Cognitive Status: No family/caregiver present to determine baseline cognitive functioning Area of Impairment: Orientation;Attention;Memory;Following commands;Safety/judgement                 Orientation Level: Disoriented to;Time;Situation Current Attention Level: Selective Memory: Decreased short-term memory Following Commands: Follows multi-step commands inconsistently Safety/Judgement: Decreased awareness of deficits     General Comments: Likely near baseline cognition      General Comments      Exercises     Assessment/Plan    PT Assessment Patient needs continued PT services  PT Problem List Decreased strength;Decreased activity tolerance;Decreased balance;Decreased mobility;Decreased knowledge of use of DME;Decreased cognition;Decreased safety awareness       PT Treatment Interventions DME instruction;Gait training;Stair training;Functional mobility training;Therapeutic activities;Therapeutic exercise;Balance training;Patient/family education    PT Goals (Current goals can be found in the Care Plan section)  Acute Rehab PT Goals Patient Stated Goal: "I'd like to use a walker for longer distances, but I'll stick with my cane in the house" PT Goal Formulation: With patient Time For Goal Achievement: 11/07/18 Potential to Achieve Goals: Good    Frequency Min 3X/week   Barriers to discharge        Co-evaluation               AM-PAC PT "6 Clicks" Mobility  Outcome Measure Help needed turning from your back to your side while in a flat bed without using bedrails?: None Help needed moving from lying on your  back to sitting on the side of a flat bed without using bedrails?: None Help needed moving to and from a bed to a chair (including a wheelchair)?: A Little Help needed standing up from a chair using your arms (e.g., wheelchair or bedside chair)?: A Little Help needed to walk in hospital room?: A Little Help needed climbing 3-5 steps with a railing? : A Little 6 Click Score: 20    End of Session Equipment Utilized During Treatment: Gait belt Activity Tolerance: Patient tolerated treatment well Patient left: in bed;with call bell/phone within reach;with bed alarm set Nurse Communication: Mobility status PT Visit Diagnosis: Other abnormalities of gait and mobility (R26.89)    Time: 1525-1550 PT Time Calculation (min) (ACUTE ONLY): 25 min   Charges:   PT Evaluation $PT Eval Moderate Complexity: 1 Mod PT Treatments $Gait Training: 8-22 mins   Mabeline Caras, PT, DPT Acute Rehabilitation Services  Pager [phone] Office [place] 10/24/2018, 5:31 PM

## 2018-10-24 NOTE — Progress Notes (Signed)
Occupational Therapy Evaluation Patient Details Name: [name] Powell MRN: [id number] DOB: [birth year] Today's Date: 10/24/2018    History of Present Illness [name] Powell is a 85 y.o. male with PMH significant for CAD s/p CABG, CHB s/p PPM, chronic diastolic CHF, Atrial Fibrillation on Eliquis, T2DM, HLD, CKD Stage 3/4, and Parkinson's disease who presents to the hospita for evaluation of decreased hemoglobin.    Clinical Impression   Pt presented with above description. PTA pt PLOF required assistance in ADLs from wife in home setting. Pt uses cane as needed for functional mobilty. Pt currently required Min A in LB ADLs, Min A bed mobilty and, Min guard in functional mobility for safety. Pt will benefit from continued acute OT to address deficits and to maximize independence in ADLs to prepare for safe transition to home setting with [facility] of [name]. OT will continue to follow acutely.     Follow Up Recommendations  Supervision/Assistance - 24 hour;Home health OT    Equipment Recommendations  3 in 1 bedside commode    Recommendations for Other Services       Precautions / Restrictions Precautions Precautions: Other (comment) Precaution Comments: Delirium precuations (orders) Restrictions Weight Bearing Restrictions: No      Mobility Bed Mobility Overal bed mobility: Needs Assistance Bed Mobility: Supine to Sit     Supine to sit: Min assist     General bed mobility comments: Pt initiated sequence of sitting at EOB with use of bed rail however required assist to power up trunk to sit up at EOB.   Transfers Overall transfer level: Needs assistance Equipment used: Rolling walker (2 wheeled) Transfers: Sit to/from Stand Sit to Stand: Min guard         General transfer comment: Min gaurd for safety and VCs for hand placement and body mechanics for safe transfer.    Balance Overall balance assessment: Needs assistance Sitting-balance support: Feet supported;Single extremity  supported Sitting balance-Leahy Scale: Fair     Standing balance support: Bilateral upper extremity supported Standing balance-Leahy Scale: Poor                             ADL either performed or assessed with clinical judgement   ADL Overall ADL's : Needs assistance/impaired Eating/Feeding: Set up;Sitting   Grooming: Set up   Upper Body Bathing: Modified independent   Lower Body Bathing: Supervison/ safety;Sitting/lateral leans   Upper Body Dressing : Modified independent;Sitting   Lower Body Dressing: Minimal assistance Lower Body Dressing Details (indicate cue type and reason): required assist to don/doff socks Toilet Transfer: Min guard;Ambulation;Cueing for sequencing;RW Toilet Transfer Details (indicate cue type and reason): simulated toilet transfer from bed to recliner. VCs required for hand placement and safety.         Functional mobility during ADLs: Min guard;Rolling walker;Cueing for sequencing       Vision         Perception     Praxis      Pertinent Vitals/Pain Pain Assessment: No/denies pain     Hand Dominance Right   Extremity/Trunk Assessment Upper Extremity Assessment Upper Extremity Assessment: Generalized weakness   Lower Extremity Assessment Lower Extremity Assessment: Defer to PT evaluation       Communication Communication Communication: HOH   Cognition Arousal/Alertness: Awake/alert Behavior During Therapy: WFL for tasks assessed/performed Overall Cognitive Status: No family/caregiver present to determine baseline cognitive functioning  General Comments: Pt Ox2 able to state person and place.    General Comments  Pt tends to figit with IV cord    Exercises     Shoulder Instructions      Home Living Family/patient expects to be discharged to:: Private residence Living Arrangements: Spouse/significant other Available Help at Discharge: Family;Available 24  hours/day Type of Home: House Home Access: Stairs to enter [employer] of Steps: 3   Home Layout: Two level;Laundry or work area in basement     [employer]: Occupational psychologist: [name]: [name] - single point;Walker - 2 wheels   Additional Comments: Pt reports no use of shower seat, however, prior admission reports access to shower seat.       Prior Functioning/Environment Level of Independence: Needs assistance  Gait / Transfers Assistance Needed: use of cane for functional mobility ADL's / Homemaking Assistance Needed: Wife assisted with dressing as needed            OT Problem List: Impaired balance (sitting and/or standing);Decreased safety awareness;Decreased knowledge of use of DME or AE      OT Treatment/Interventions: Self-care/ADL training;Therapeutic exercise;DME and/or AE instruction;Therapeutic activities;Patient/family education;Balance training    OT Goals(Current goals can be found in the care plan section) Acute Rehab OT Goals Patient Stated Goal: goal not stated OT Goal Formulation: Patient unable to participate in goal setting Time For Goal Achievement: 11/07/18 Potential to Achieve Goals: Good  OT Frequency: Min 2X/week   Barriers to D/C:            Co-evaluation              AM-PAC OT "6 Clicks" Daily Activity     Outcome Measure Help from another person eating meals?: A Little Help from another person taking care of personal grooming?: A Little Help from another person toileting, which includes using toliet, bedpan, or urinal?: A Little Help from another person bathing (including washing, rinsing, drying)?: A Little Help from another person to put on and taking off regular upper body clothing?: A Little Help from another person to put on and taking off regular lower body clothing?: A Little 6 Click Score: 18   End of Session Equipment Utilized During Treatment: Gait belt;Rolling  walker Nurse Communication: Mobility status  Activity Tolerance: Patient tolerated treatment well Patient left: in chair;with call bell/phone within reach;with chair alarm set  OT Visit Diagnosis: Unsteadiness on feet (R26.81);Muscle weakness (generalized) (M62.81)                Time: 3335-4562 OT Time Calculation (min): 20 min Charges:  OT General Charges $OT Visit: 1 Visit OT Evaluation $OT Eval Low Complexity: [place], MSOT, OTR/L  Supplemental Rehabilitation Services  [phone]   Marius Ditch 10/24/2018, 12:59 PM

## 2018-10-24 NOTE — Care Management Obs Status (Signed)
[facility] NOTIFICATION   Patient Details  Name: [name] Powell MRN: [id number] Date of Birth: 1922/12/29   Medicare Observation Status Notification Given:  Yes    Marilu Favre, RN 10/24/2018, 12:18 PM

## 2018-10-25 LAB — BASIC METABOLIC PANEL
Anion gap: 12 (ref 5–15)
BUN: 44 mg/dL — ABNORMAL HIGH (ref 8–23)
CO2: 24 mmol/L (ref 22–32)
Calcium: 8.7 mg/dL — ABNORMAL LOW (ref 8.9–10.3)
Chloride: 105 mmol/L (ref 98–111)
Creatinine, Ser: 1.76 mg/dL — ABNORMAL HIGH (ref 0.61–1.24)
GFR calc Af Amer: 38 mL/min — ABNORMAL LOW (ref >60)
GFR calc non Af Amer: 32 mL/min — ABNORMAL LOW (ref >60)
Glucose, Bld: 124 mg/dL — ABNORMAL HIGH (ref 70–99)
Potassium: 4 mmol/L (ref 3.5–5.1)
Sodium: 141 mmol/L (ref 135–145)

## 2018-10-25 LAB — CBC
HCT: 28.3 % — ABNORMAL LOW (ref 39.0–52.0)
Hemoglobin: 8.9 g/dL — ABNORMAL LOW (ref 13.0–17.0)
MCH: 33.6 pg (ref 26.0–34.0)
MCHC: 31.4 g/dL (ref 30.0–36.0)
MCV: 106.8 fL — ABNORMAL HIGH (ref 80.0–100.0)
Platelets: 178 10*3/uL (ref 150–400)
RBC: 2.65 MIL/uL — ABNORMAL LOW (ref 4.22–5.81)
RDW: 15.3 % (ref 11.5–15.5)
WBC: 7.5 10*3/uL (ref 4.0–10.5)
nRBC: 0 % (ref 0.0–0.2)

## 2018-10-25 LAB — GLUCOSE, CAPILLARY
Glucose-Capillary: 129 mg/dL — ABNORMAL HIGH (ref 70–99)
Glucose-Capillary: 154 mg/dL — ABNORMAL HIGH (ref 70–99)

## 2018-10-25 MED ORDER — FUROSEMIDE 40 MG PO TABS
40.0000 mg | ORAL_TABLET | Freq: Every day | ORAL | Status: DC
  Administered 2018-10-25: 40 mg via ORAL
  Filled 2018-10-25: qty 1

## 2018-10-25 MED ORDER — FUROSEMIDE 40 MG PO TABS: 40.0000 mg | ORAL_TABLET | Freq: Every day | ORAL | 1 refills | Status: DC

## 2018-10-25 NOTE — TOC Progression Note (Signed)
Transition of Care [facility]) - Progression Note    Patient Details  Name: [name] Powell MRN: [id number] Date of Birth: 1922-07-24  Transition of [facility]) CM/SW Contact  [name] Lefevre Edson Snowball, RN Phone Number: 10/25/2018, 8:23 AM  Clinical Narrative:     [name] Powell with [name] Powell at [facility]. They will accept referral for home health PT. Will fax once MD signs.   Called [name] Powell with [facility] with order for rollator.  Expected Discharge Plan: [place] Barriers to Discharge: Continued Medical Work up  Expected Discharge Plan and Services Expected Discharge Plan: [place]   Discharge Planning Services: CM Consult Post Acute Care Choice: Home Health, Durable Medical Equipment Living arrangements for the past 2 months: Single Family Home                 DME Arranged: [name] Powell DME Agency: [employer] Date DME Agency Contacted: 10/25/18 Time DME Agency Contacted: [phone] Representative spoke with at DME Agency: [name] Powell Arranged: OT [place]: [facility] Date [place]: 10/25/18 Time [place]: [phone] Representative spoke with at [place]: [place] ([place]) Interventions    Readmission Risk Interventions No flowsheet data found.

## 2018-10-25 NOTE — Discharge Summary (Signed)
Physician Discharge Summary  [name] Powell:[id number] DOB: [birth year] DOA: 10/23/2018  PCP: [name], MD  Admit date: 10/23/2018 Discharge date: 10/25/2018  Time spent: 45 minutes  Recommendations for Outpatient Follow-up:  1. Follow up with PCP 1-2 weeks for evaluation of volume status, track Hg. 2. Home health PT  Discharge Diagnoses:  Principal Problem:   Acute on chronic diastolic congestive heart failure (HCC) Active Problems:   Symptomatic anemia   CAD s/p CABG 2003   CHB (complete heart block) (HCC)   DM2 (diabetes mellitus, type 2) (HCC)   Atrial fibrillation (HCC)   CKD (chronic kidney disease), stage III ([place])   Dyslipidemia   Discharge Condition: stable  Diet recommendation: heart healthy carb modified  Filed Weights   10/23/18 2001 10/24/18 0500 10/25/18 0513  Weight: 83.3 kg 83.3 kg 77.1 kg    History of present illness:  85 yo hx chf, afib, cad, DM admitted with acute on chronic heart failure and symptomatic anemia. Provided with IV lasix.  Anemia work up. Hg stable today at 8.3.   Hospital Course:   Acute on chronic diastolic CHF: likely related to decrease in lasix dose. BNP 906, chest xray with vascular congestion. Weight stable. Volume status -539mls. Given IV Lasix 40 mg x2. Will discharge on lasix 40mg  po daily.Continue home Lopressor. Advised to weigh daily with instructions for weight gain.  Acute on chronicanemia:Hemoglobin decreased to 8.6 compared to 10.51-week prior to admission. Hg 8.9 at discharge.  No obvious bleeding, FOBT is negative. Home meds include Eliquis. Macrocytic. Vitamin B12 1487 and folate 19.3. OP follow up  Acute onCKD stage3/4:Creatinine slightly improved after discontinuing valsartan 1 week ago. Creatinine 1.76 at discharge. -hold nephrotoxins as able -monitor urine output -repeat in am  Atrial fibrillation CHB s/p PPM: Rate currently controlled. Eliquis held initially. Resumed 6/16. Hg stable. may  need to evaluated risk/benefit ratio given #2. OP follow u p  CADs/pCABG: no chest pain. Chronic and stable.  Type 2 diabetes: Home meds include metformin, Amaryl, and Januvia. Serum glucose 110.   Hyperlipidemia:Continue atorvastatin  Parkinson's disease:Continue home Sinemet IR. PT/OT eval yielded [facility] PT/OT recommendations. Ambulating in hall with steady gait day of discharge  Procedures:    Consultations:    Discharge Exam: Vitals:   10/24/18 2227 10/25/18 0513  BP:  (!) 141/75  Pulse: 61 (!) 58  Resp:  16  Temp:  97.9 F (36.6 C)  SpO2:  93%    General: awake alert oriented x3. No acute distress Cardiovascular: rrr no mgr no LE edema Respiratory: normal effort BS clear bilaterally no wheeze  Discharge Instructions   Discharge Instructions    Call MD for:  extreme fatigue   Complete by: As directed    Call MD for:  persistant dizziness or light-headedness   Complete by: As directed    Call MD for:  severe uncontrolled pain   Complete by: As directed    Diet - low sodium heart healthy   Complete by: As directed    Discharge instructions   Complete by: As directed    Take medications as prescribed Follow up with PCP 1-2 weeks for evaluation of symptoms I.e. anemia and edema   Heart Failure patients record your daily weight using the same scale at the same time of day   Complete by: As directed    Increase activity slowly   Complete by: As directed      Allergies as of 10/25/2018      Reactions  Lanoxin [digoxin] Rash   Eyelid rash   Mexitil [mexiletine] Other (See Comments)   unknown      Medication List    TAKE these medications   atorvastatin 20 MG tablet Commonly known as: LIPITOR Take 20 mg by mouth daily.   carbidopa-levodopa 25-100 MG tablet Commonly known as: SINEMET IR Take 1 tablet by mouth 3 (three) times daily.   Eliquis 2.5 MG Tabs tablet Generic drug: apixaban TAKE 1 TABLET TWICE DAILY What changed: how much to take    furosemide 40 MG tablet Commonly known as: LASIX Take 1 tablet (40 mg total) by mouth daily. Start taking on: October 26, 2018 What changed: how much to take   glimepiride 4 MG tablet Commonly known as: AMARYL Take 4 mg by mouth daily before breakfast.   Januvia 50 MG tablet Generic drug: sitaGLIPtin Take 1 tablet by mouth every evening.   metFORMIN 500 MG 24 hr tablet Commonly known as: GLUCOPHAGE-XR Take 500 mg by mouth daily with breakfast.   metoprolol tartrate 50 MG tablet Commonly known as: LOPRESSOR TAKE 2 TABLETS IN THE MORNING  AND TAKE 1 TABLET IN THE EVENING What changed: See the new instructions.   polyethylene glycol 17 g packet Commonly known as: MIRALAX / GLYCOLAX Take 17 g by mouth daily. To prevent constipation   potassium chloride 10 MEQ tablet Commonly known as: K-DUR TAKE 1 TABLET (10 MEQ TOTAL) BY MOUTH DAILY.   PreserVision AREDS Caps Take 1 capsule by mouth 2 (two) times daily.   VITAMIN D PO Take 10,000 Units by mouth every Friday. On Friday            Durable Medical Equipment  (From admission, onward)         Start     Ordered   10/25/18 0838  For home use only DME 4 wheeled rolling walker with seat  Once    Comments: Length of need lifetime  Question:  Patient needs a walker to treat with the following condition  Answer:  CHF (congestive heart failure) ([place])   10/25/18 0837         Allergies  Allergen Reactions  . Lanoxin [Digoxin] Rash    Eyelid rash  . Mexitil [Mexiletine] Other (See Comments)    unknown      The results of significant diagnostics from this hospitalization (including imaging, microbiology, ancillary and laboratory) are listed below for reference.    Significant Diagnostic Studies: Portable Chest 1 View  Result Date: 10/23/2018 CLINICAL DATA:  Chronic diastolic CHF EXAM: PORTABLE CHEST 1 VIEW COMPARISON:  07/27/2016 FINDINGS: Left pacer remains in place, unchanged. Prior median sternotomy and CABG.  Cardiomegaly with vascular congestion. Left retrocardiac opacity again noted which could reflect atelectasis, infiltrate or effusion. No confluent opacity on the right. No acute bony abnormality. IMPRESSION: Cardiomegaly, vascular congestion. Stable left retrocardiac opacity which could reflect atelectasis, pneumonia, and/or effusion. Electronically Signed   By: Rolm Baptise M.D.   On: 10/23/2018 19:35    Microbiology: Recent Results (from the past 240 hour(s))  Novel Coronavirus,NAA,(SEND-OUT TO REF LAB - TAT 24-48 hrs); Hosp Order     Status: None   Collection Time: 10/23/18  6:10 PM   Specimen: Nasopharyngeal Swab; Respiratory  Result Value Ref Range Status   SARS-CoV-2, NAA NOT DETECTED NOT DETECTED Final    Comment: (NOTE) This test was developed and its performance characteristics determined by [employer]. This test has not been FDA cleared or approved. This test has been authorized by  FDA under an Emergency Use Authorization (EUA). This test is only authorized for the duration of time the declaration that circumstances exist justifying the authorization of the emergency use of in vitro diagnostic tests for detection of SARS-CoV-2 virus and/or diagnosis of COVID-19 infection under section 564(b)(1) of the Act, 21 U.S.C. 086VHQ-4(O)(9), unless the authorization is terminated or revoked sooner. When diagnostic testing is negative, the possibility of a false negative result should be considered in the context of a patient's recent exposures and the presence of clinical signs and symptoms consistent with COVID-19. An individual without symptoms of COVID-19 and who is not shedding SARS-CoV-2 virus would expect to have a negative (not detected) result in this assay. Performed  At: [facility] [address] [place], [place] [id number] Rush Farmer MD KG:[id number]    [place]  Final    Comment: Performed at [facility], [place]  [address], [place], [place] [address]     Labs: Basic Metabolic Panel: Recent Labs  Lab 10/23/18 1632 10/24/18 0341 10/25/18 0250  NA 142 139 141  K 5.0 4.2 4.0  CL 108 106 105  CO2 24 23 24   GLUCOSE 91 110* 124*  BUN 52* 51* 44*  CREATININE 1.98* 1.84* 1.76*  CALCIUM 8.8* 8.3* 8.7*   Liver Function Tests: Recent Labs  Lab 10/23/18 1632  AST 18  ALT 9  ALKPHOS 90  BILITOT 0.9  PROT 6.8  ALBUMIN 3.3*   No results for input(s): LIPASE, AMYLASE in the last 168 hours. No results for input(s): AMMONIA in the last 168 hours. CBC: Recent Labs  Lab 10/23/18 1632 10/24/18 0341 10/25/18 0250  WBC 8.1 7.7 7.5  HGB 8.6* 8.3* 8.9*  HCT 28.4* 25.9* 28.3*  MCV 111.4* 107.5* 106.8*  PLT 158 154 178   Cardiac Enzymes: No results for input(s): CKTOTAL, CKMB, CKMBINDEX, TROPONINI in the last 168 hours. BNP: BNP (last 3 results) Recent Labs    10/23/18 2118  BNP 906.8*    ProBNP (last 3 results) No results for input(s): PROBNP in the last 8760 hours.  CBG: Recent Labs  Lab 10/24/18 0816 10/24/18 1208 10/24/18 1724 10/24/18 2027 10/25/18 0736  GLUCAP 83 112* 145* 128* 129*       Signed:  Radene Gunning NP [facility] 10/25/2018, 10:54 AM

## 2018-10-25 NOTE — Progress Notes (Signed)
Patient discharged to home with instructions given to his son and wife via phone call.

## 2018-10-25 NOTE — Progress Notes (Signed)
Physical Therapy Treatment Patient Details Name: [name] Powell MRN: [id number] DOB: [birth year] Today's Date: 10/25/2018    History of Present Illness Pt is a 85 y.o. male admitted from PCP's office on 10/23/18 with acute on chronic anemia and CHF. Of note, recent ED visit 10/16/18 with dehydration and AKI. PMH includes CAD, pacemaker, CHF, afib on Eliquis, DM2, CKD 3-4, Parkinson's disease.   PT Comments    Pt progressing with mobility. Able to initiate transfer and gait training with use of rollator; pt requiring frequent, repeated cues for sequencing and safety with use of rollator and brakes; min guard for balance/safety. RW is a simpler option, but pt would benefit from having seat for rest breaks when ambulating longer distances. Recommend wife present when rollator being used. Overall, pt moving well.    Follow Up Recommendations  Home health PT;Supervision/Assistance - 24 hour     Equipment Recommendations  Other (comment)(rollator)    Recommendations for Other Services       Precautions / Restrictions Precautions Precautions: Fall Restrictions Weight Bearing Restrictions: No    Mobility  Bed Mobility               General bed mobility comments: Received sitting in recliner  Transfers Overall transfer level: Needs assistance Equipment used: 4-wheeled walker Transfers: Sit to/from Stand Sit to Stand: Supervision;Min guard         General transfer comment: Cues for hand placement standing from recliner, supervision for safety. Practiced multiple trials sit<>stand from rollator, requiring repeated cues to lock brakes and correct hand placement for safety with transfers; pt able to perform with min guard  Ambulation/Gait Ambulation/Gait assistance: Min guard;Supervision Gait Distance (Feet): 120 Feet Assistive device: 4-wheeled walker Gait Pattern/deviations: Step-through pattern;Decreased stride length;Trunk flexed Gait velocity: Decreased Gait velocity  interpretation: 1.31 - 2.62 ft/sec, indicative of limited community ambulator General Gait Details: Slow, drifting gait with rollator and intermittent min guard for balance. Cues for sequencing and safety with rollator; 2x seated rest breaks to trial sit<>stand with rollator   Stairs             Wheelchair Mobility    Modified Rankin (Stroke Patients Only)       Balance Overall balance assessment: Needs assistance Sitting-balance support: Feet supported;Single extremity supported Sitting balance-Leahy Scale: Good     Standing balance support: Bilateral upper extremity supported;No upper extremity supported Standing balance-Leahy Scale: Fair Standing balance comment: Can static stand without UE support                            Cognition Arousal/Alertness: Awake/alert Behavior During Therapy: WFL for tasks assessed/performed Overall Cognitive Status: No family/caregiver present to determine baseline cognitive functioning Area of Impairment: Orientation;Attention;Memory;Following commands;Safety/judgement;Problem solving                   Current Attention Level: Selective Memory: Decreased short-term memory Following Commands: Follows multi-step commands inconsistently;Follows one step commands with increased time Safety/Judgement: Decreased awareness of deficits   Problem Solving: Requires verbal cues General Comments: Likely near baseline cognition. Repeated, frequent cues for rollator training      Exercises      General Comments        Pertinent Vitals/Pain Pain Assessment: No/denies pain    Home Living                      Prior Function  PT Goals (current goals can now be found in the care plan section) Acute Rehab PT Goals Patient Stated Goal: "I'd like to use a walker for longer distances, but I'll stick with my cane in the house" PT Goal Formulation: With patient Time For Goal Achievement:  11/07/18 Potential to Achieve Goals: Good Progress towards PT goals: Progressing toward goals    Frequency    Min 3X/week      PT Plan Current plan remains appropriate    Co-evaluation              AM-PAC PT "6 Clicks" Mobility   Outcome Measure  Help needed turning from your back to your side while in a flat bed without using bedrails?: None Help needed moving from lying on your back to sitting on the side of a flat bed without using bedrails?: None Help needed moving to and from a bed to a chair (including a wheelchair)?: A Little Help needed standing up from a chair using your arms (e.g., wheelchair or bedside chair)?: A Little Help needed to walk in hospital room?: A Little Help needed climbing 3-5 steps with a railing? : A Little 6 Click Score: 20    End of Session Equipment Utilized During Treatment: Gait belt Activity Tolerance: Patient tolerated treatment well Patient left: in chair;with call bell/phone within reach;with chair alarm set Nurse Communication: Mobility status PT Visit Diagnosis: Other abnormalities of gait and mobility (R26.89)     Time: 0912-0929 PT Time Calculation (min) (ACUTE ONLY): 17 min  Charges:  $Gait Training: 8-22 mins                    Mabeline Caras, PT, DPT Acute Rehabilitation Services  Pager [phone] Office [place] 10/25/2018, 11:02 AM

## 2018-10-25 NOTE — Progress Notes (Signed)
Occupational Therapy Treatment Patient Details Name: [name] Powell MRN: [id number] DOB: 01-02-1923 Today's Date: 10/25/2018    History of present illness Pt is a 85 y.o. male admitted from PCP's office on 10/23/18 with acute on chronic anemia and CHF. Of note, recent ED visit 10/16/18 with dehydration and AKI. PMH includes CAD, pacemaker, CHF, afib on Eliquis, DM2, CKD 3-4, Parkinson's disease.   OT comments  Pt progressing toward OT goals. Pt tolerated session well with no complains of pain. Pt engaged in functional transfer to sink for ADL with supervision and min guard for sit to stand transition for safety. Pt required VCs for sequencing and safety mobility and standing balance with use of RW during oral care. Pt will benefit from continued OT to address safety awareness and activity tolerance to return home safely. DC and freq remains the same OT will continue to follow acutely.    Follow Up Recommendations  Supervision/Assistance - 24 hour;Home health OT    Equipment Recommendations  3 in 1 bedside commode    Recommendations for Other Services      Precautions / Restrictions Precautions Precautions: Fall Precaution Comments: Delirium precuations (orders) Restrictions Weight Bearing Restrictions: No       Mobility Bed Mobility Overal bed mobility: Modified Independent             General bed mobility comments: received sitting in chair.  Transfers Overall transfer level: Needs assistance Equipment used: 4-wheeled walker Transfers: Sit to/from Stand Sit to Stand: Supervision         General transfer comment: Cues for hand placements during stand to sit transition. Pt required surpervision for safety.     Balance Overall balance assessment: Needs assistance Sitting-balance support: Feet supported;Single extremity supported Sitting balance-Leahy Scale: Good     Standing balance support: Bilateral upper extremity supported;No upper extremity  supported Standing balance-Leahy Scale: Fair Standing balance comment: Can static stand without UE support                           ADL either performed or assessed with clinical judgement   ADL Overall ADL's : Needs assistance/impaired     Grooming: Supervision/safety;Standing;Cueing for safety Grooming Details (indicate cue type and reason): cues to stand closer to walker                             Functional mobility during ADLs: Min guard;Rolling walker;Cueing for safety;Cueing for sequencing General ADL Comments: Pt requires cues due to decreased safety awareness. Hand placement and use of RW.     Vision       Perception     Praxis      Cognition Arousal/Alertness: Awake/alert Behavior During Therapy: WFL for tasks assessed/performed Overall Cognitive Status: No family/caregiver present to determine baseline cognitive functioning Area of Impairment: Orientation;Attention;Memory;Following commands;Safety/judgement;Problem solving                 Orientation Level: Disoriented to;Time;Situation Current Attention Level: Selective Memory: Decreased short-term memory Following Commands: Follows multi-step commands inconsistently;Follows one step commands with increased time Safety/Judgement: Decreased awareness of deficits   Problem Solving: Requires verbal cues General Comments: Likely near baseline cognition. Repeated, frequent cues for rollator training        Exercises     Shoulder Instructions       General Comments      Pertinent Vitals/ Pain       Pain Assessment:  No/denies pain  Home Living                                          Prior Functioning/Environment              Frequency  Min 2X/week        Progress Toward Goals  OT Goals(current goals can now be found in the care plan section)  Progress towards OT goals: Progressing toward goals  Acute Rehab OT Goals Patient Stated Goal:  "I'd like to use a walker for longer distances, but I'll stick with my cane in the house" OT Goal Formulation: Patient unable to participate in goal setting Time For Goal Achievement: 11/07/18 Potential to Achieve Goals: Good ADL Goals Pt Will Perform Grooming: with modified independence;standing Pt Will Transfer to Toilet: with modified independence;ambulating;bedside commode Pt Will Perform Toileting - Clothing Manipulation and hygiene: with modified independence;sitting/lateral leans Pt Will Perform Tub/Shower Transfer: with modified independence;ambulating;3 in 1;rolling walker  Plan Discharge plan remains appropriate;Frequency remains appropriate    Co-evaluation                 AM-PAC OT "6 Clicks" Daily Activity     Outcome Measure   Help from another person eating meals?: A Little Help from another person taking care of personal grooming?: A Little Help from another person toileting, which includes using toliet, bedpan, or urinal?: A Little Help from another person bathing (including washing, rinsing, drying)?: A Little Help from another person to put on and taking off regular upper body clothing?: A Little Help from another person to put on and taking off regular lower body clothing?: A Little 6 Click Score: 18    End of Session Equipment Utilized During Treatment: Gait belt;Rolling walker  OT Visit Diagnosis: Unsteadiness on feet (R26.81);Muscle weakness (generalized) (M62.81)   Activity Tolerance Patient tolerated treatment well   Patient Left in chair;with call bell/phone within reach;with chair alarm set   Nurse Communication Mobility status        Time: 3790-2409 OT Time Calculation (min): 15 min  Charges: OT General Charges $OT Visit: 1 Visit OT Treatments $Self Care/Home Management : 8-22 mins  Minus Breeding, MSOT, OTR/L  Supplemental Rehabilitation Services  [phone]    Marius Ditch 10/25/2018, 12:16 PM

## 2018-10-26 ENCOUNTER — Other Ambulatory Visit: Payer: Self-pay | Admitting: Cardiovascular Disease

## 2018-10-26 NOTE — Telephone Encounter (Signed)
Pt is a 31yom requesting eliquis 2.5mg . Wt 82.8kg, scr 1.76(10/25/18), lov w/ [facility] (05/17/18)  DX AFIB I would refill for 54mos

## 2018-10-31 DIAGNOSIS — D649 Anemia, unspecified: Secondary | ICD-10-CM | POA: Diagnosis not present

## 2018-10-31 DIAGNOSIS — Z85828 Personal history of other malignant neoplasm of skin: Secondary | ICD-10-CM | POA: Diagnosis not present

## 2018-10-31 DIAGNOSIS — N183 Chronic kidney disease, stage 3 (moderate): Secondary | ICD-10-CM | POA: Diagnosis not present

## 2018-10-31 DIAGNOSIS — I509 Heart failure, unspecified: Secondary | ICD-10-CM | POA: Diagnosis not present

## 2018-11-01 DIAGNOSIS — D649 Anemia, unspecified: Secondary | ICD-10-CM | POA: Diagnosis not present

## 2018-11-01 DIAGNOSIS — E119 Type 2 diabetes mellitus without complications: Secondary | ICD-10-CM | POA: Diagnosis not present

## 2018-11-01 DIAGNOSIS — F0390 Unspecified dementia without behavioral disturbance: Secondary | ICD-10-CM | POA: Diagnosis not present

## 2018-11-01 DIAGNOSIS — R2689 Other abnormalities of gait and mobility: Secondary | ICD-10-CM | POA: Diagnosis not present

## 2018-11-01 DIAGNOSIS — I2510 Atherosclerotic heart disease of native coronary artery without angina pectoris: Secondary | ICD-10-CM | POA: Diagnosis not present

## 2018-11-01 DIAGNOSIS — I4891 Unspecified atrial fibrillation: Secondary | ICD-10-CM | POA: Diagnosis not present

## 2018-11-01 DIAGNOSIS — I5032 Chronic diastolic (congestive) heart failure: Secondary | ICD-10-CM | POA: Diagnosis not present

## 2018-11-01 DIAGNOSIS — Z9181 History of falling: Secondary | ICD-10-CM | POA: Diagnosis not present

## 2018-11-01 DIAGNOSIS — Z741 Need for assistance with personal care: Secondary | ICD-10-CM | POA: Diagnosis not present

## 2018-11-02 DIAGNOSIS — C4442 Squamous cell carcinoma of skin of scalp and neck: Secondary | ICD-10-CM | POA: Diagnosis not present

## 2018-11-03 ENCOUNTER — Other Ambulatory Visit: Payer: Self-pay | Admitting: Cardiovascular Disease

## 2018-11-03 DIAGNOSIS — Z9181 History of falling: Secondary | ICD-10-CM | POA: Diagnosis not present

## 2018-11-03 DIAGNOSIS — D649 Anemia, unspecified: Secondary | ICD-10-CM | POA: Diagnosis not present

## 2018-11-03 DIAGNOSIS — Z741 Need for assistance with personal care: Secondary | ICD-10-CM | POA: Diagnosis not present

## 2018-11-03 DIAGNOSIS — I2510 Atherosclerotic heart disease of native coronary artery without angina pectoris: Secondary | ICD-10-CM | POA: Diagnosis not present

## 2018-11-03 DIAGNOSIS — I5032 Chronic diastolic (congestive) heart failure: Secondary | ICD-10-CM | POA: Diagnosis not present

## 2018-11-03 DIAGNOSIS — F0390 Unspecified dementia without behavioral disturbance: Secondary | ICD-10-CM | POA: Diagnosis not present

## 2018-11-03 DIAGNOSIS — E119 Type 2 diabetes mellitus without complications: Secondary | ICD-10-CM | POA: Diagnosis not present

## 2018-11-03 DIAGNOSIS — R2689 Other abnormalities of gait and mobility: Secondary | ICD-10-CM | POA: Diagnosis not present

## 2018-11-03 DIAGNOSIS — I4891 Unspecified atrial fibrillation: Secondary | ICD-10-CM | POA: Diagnosis not present

## 2018-11-08 DIAGNOSIS — I2510 Atherosclerotic heart disease of native coronary artery without angina pectoris: Secondary | ICD-10-CM | POA: Diagnosis not present

## 2018-11-08 DIAGNOSIS — R2689 Other abnormalities of gait and mobility: Secondary | ICD-10-CM | POA: Diagnosis not present

## 2018-11-08 DIAGNOSIS — Z9181 History of falling: Secondary | ICD-10-CM | POA: Diagnosis not present

## 2018-11-08 DIAGNOSIS — D649 Anemia, unspecified: Secondary | ICD-10-CM | POA: Diagnosis not present

## 2018-11-08 DIAGNOSIS — Z741 Need for assistance with personal care: Secondary | ICD-10-CM | POA: Diagnosis not present

## 2018-11-08 DIAGNOSIS — E119 Type 2 diabetes mellitus without complications: Secondary | ICD-10-CM | POA: Diagnosis not present

## 2018-11-08 DIAGNOSIS — I4891 Unspecified atrial fibrillation: Secondary | ICD-10-CM | POA: Diagnosis not present

## 2018-11-08 DIAGNOSIS — F0390 Unspecified dementia without behavioral disturbance: Secondary | ICD-10-CM | POA: Diagnosis not present

## 2018-11-08 DIAGNOSIS — I5032 Chronic diastolic (congestive) heart failure: Secondary | ICD-10-CM | POA: Diagnosis not present

## 2018-11-09 DIAGNOSIS — Z741 Need for assistance with personal care: Secondary | ICD-10-CM | POA: Diagnosis not present

## 2018-11-09 DIAGNOSIS — R2689 Other abnormalities of gait and mobility: Secondary | ICD-10-CM | POA: Diagnosis not present

## 2018-11-09 DIAGNOSIS — I5032 Chronic diastolic (congestive) heart failure: Secondary | ICD-10-CM | POA: Diagnosis not present

## 2018-11-09 DIAGNOSIS — Z9181 History of falling: Secondary | ICD-10-CM | POA: Diagnosis not present

## 2018-11-09 DIAGNOSIS — I2510 Atherosclerotic heart disease of native coronary artery without angina pectoris: Secondary | ICD-10-CM | POA: Diagnosis not present

## 2018-11-09 DIAGNOSIS — E119 Type 2 diabetes mellitus without complications: Secondary | ICD-10-CM | POA: Diagnosis not present

## 2018-11-09 DIAGNOSIS — D649 Anemia, unspecified: Secondary | ICD-10-CM | POA: Diagnosis not present

## 2018-11-09 DIAGNOSIS — F0390 Unspecified dementia without behavioral disturbance: Secondary | ICD-10-CM | POA: Diagnosis not present

## 2018-11-09 DIAGNOSIS — I4891 Unspecified atrial fibrillation: Secondary | ICD-10-CM | POA: Diagnosis not present

## 2018-11-10 DIAGNOSIS — Z9181 History of falling: Secondary | ICD-10-CM | POA: Diagnosis not present

## 2018-11-10 DIAGNOSIS — E119 Type 2 diabetes mellitus without complications: Secondary | ICD-10-CM | POA: Diagnosis not present

## 2018-11-10 DIAGNOSIS — Z741 Need for assistance with personal care: Secondary | ICD-10-CM | POA: Diagnosis not present

## 2018-11-10 DIAGNOSIS — F0390 Unspecified dementia without behavioral disturbance: Secondary | ICD-10-CM | POA: Diagnosis not present

## 2018-11-10 DIAGNOSIS — I2510 Atherosclerotic heart disease of native coronary artery without angina pectoris: Secondary | ICD-10-CM | POA: Diagnosis not present

## 2018-11-10 DIAGNOSIS — D649 Anemia, unspecified: Secondary | ICD-10-CM | POA: Diagnosis not present

## 2018-11-10 DIAGNOSIS — R2689 Other abnormalities of gait and mobility: Secondary | ICD-10-CM | POA: Diagnosis not present

## 2018-11-10 DIAGNOSIS — I5032 Chronic diastolic (congestive) heart failure: Secondary | ICD-10-CM | POA: Diagnosis not present

## 2018-11-10 DIAGNOSIS — I4891 Unspecified atrial fibrillation: Secondary | ICD-10-CM | POA: Diagnosis not present

## 2018-11-13 DIAGNOSIS — F0390 Unspecified dementia without behavioral disturbance: Secondary | ICD-10-CM | POA: Diagnosis not present

## 2018-11-13 DIAGNOSIS — R2689 Other abnormalities of gait and mobility: Secondary | ICD-10-CM | POA: Diagnosis not present

## 2018-11-13 DIAGNOSIS — I2510 Atherosclerotic heart disease of native coronary artery without angina pectoris: Secondary | ICD-10-CM | POA: Diagnosis not present

## 2018-11-13 DIAGNOSIS — E119 Type 2 diabetes mellitus without complications: Secondary | ICD-10-CM | POA: Diagnosis not present

## 2018-11-13 DIAGNOSIS — D649 Anemia, unspecified: Secondary | ICD-10-CM | POA: Diagnosis not present

## 2018-11-13 DIAGNOSIS — I4891 Unspecified atrial fibrillation: Secondary | ICD-10-CM | POA: Diagnosis not present

## 2018-11-13 DIAGNOSIS — I5032 Chronic diastolic (congestive) heart failure: Secondary | ICD-10-CM | POA: Diagnosis not present

## 2018-11-13 DIAGNOSIS — Z9181 History of falling: Secondary | ICD-10-CM | POA: Diagnosis not present

## 2018-11-13 DIAGNOSIS — Z741 Need for assistance with personal care: Secondary | ICD-10-CM | POA: Diagnosis not present

## 2018-11-14 DIAGNOSIS — Z9181 History of falling: Secondary | ICD-10-CM | POA: Diagnosis not present

## 2018-11-14 DIAGNOSIS — D649 Anemia, unspecified: Secondary | ICD-10-CM | POA: Diagnosis not present

## 2018-11-14 DIAGNOSIS — I5032 Chronic diastolic (congestive) heart failure: Secondary | ICD-10-CM | POA: Diagnosis not present

## 2018-11-14 DIAGNOSIS — F0390 Unspecified dementia without behavioral disturbance: Secondary | ICD-10-CM | POA: Diagnosis not present

## 2018-11-14 DIAGNOSIS — I4891 Unspecified atrial fibrillation: Secondary | ICD-10-CM | POA: Diagnosis not present

## 2018-11-14 DIAGNOSIS — Z741 Need for assistance with personal care: Secondary | ICD-10-CM | POA: Diagnosis not present

## 2018-11-14 DIAGNOSIS — R2689 Other abnormalities of gait and mobility: Secondary | ICD-10-CM | POA: Diagnosis not present

## 2018-11-14 DIAGNOSIS — I2510 Atherosclerotic heart disease of native coronary artery without angina pectoris: Secondary | ICD-10-CM | POA: Diagnosis not present

## 2018-11-14 DIAGNOSIS — E119 Type 2 diabetes mellitus without complications: Secondary | ICD-10-CM | POA: Diagnosis not present

## 2018-11-16 DIAGNOSIS — G20 Parkinson's disease: Secondary | ICD-10-CM | POA: Diagnosis not present

## 2018-11-16 DIAGNOSIS — D649 Anemia, unspecified: Secondary | ICD-10-CM | POA: Diagnosis not present

## 2018-11-16 DIAGNOSIS — I509 Heart failure, unspecified: Secondary | ICD-10-CM | POA: Diagnosis not present

## 2018-11-16 DIAGNOSIS — N183 Chronic kidney disease, stage 3 (moderate): Secondary | ICD-10-CM | POA: Diagnosis not present

## 2018-11-16 DIAGNOSIS — Z8546 Personal history of malignant neoplasm of prostate: Secondary | ICD-10-CM | POA: Diagnosis not present

## 2018-11-16 DIAGNOSIS — I2510 Atherosclerotic heart disease of native coronary artery without angina pectoris: Secondary | ICD-10-CM | POA: Diagnosis not present

## 2018-11-16 DIAGNOSIS — E119 Type 2 diabetes mellitus without complications: Secondary | ICD-10-CM | POA: Diagnosis not present

## 2018-11-16 DIAGNOSIS — I4891 Unspecified atrial fibrillation: Secondary | ICD-10-CM | POA: Diagnosis not present

## 2018-11-16 DIAGNOSIS — Z741 Need for assistance with personal care: Secondary | ICD-10-CM | POA: Diagnosis not present

## 2018-11-16 DIAGNOSIS — E785 Hyperlipidemia, unspecified: Secondary | ICD-10-CM | POA: Diagnosis not present

## 2018-11-16 DIAGNOSIS — Z9181 History of falling: Secondary | ICD-10-CM | POA: Diagnosis not present

## 2018-11-16 DIAGNOSIS — F0390 Unspecified dementia without behavioral disturbance: Secondary | ICD-10-CM | POA: Diagnosis not present

## 2018-11-16 DIAGNOSIS — I5032 Chronic diastolic (congestive) heart failure: Secondary | ICD-10-CM | POA: Diagnosis not present

## 2018-11-16 DIAGNOSIS — Z950 Presence of cardiac pacemaker: Secondary | ICD-10-CM | POA: Diagnosis not present

## 2018-11-16 DIAGNOSIS — E1122 Type 2 diabetes mellitus with diabetic chronic kidney disease: Secondary | ICD-10-CM | POA: Diagnosis not present

## 2018-11-16 DIAGNOSIS — D631 Anemia in chronic kidney disease: Secondary | ICD-10-CM | POA: Diagnosis not present

## 2018-11-16 DIAGNOSIS — R2689 Other abnormalities of gait and mobility: Secondary | ICD-10-CM | POA: Diagnosis not present

## 2018-11-17 DIAGNOSIS — I5032 Chronic diastolic (congestive) heart failure: Secondary | ICD-10-CM | POA: Diagnosis not present

## 2018-11-17 DIAGNOSIS — I2510 Atherosclerotic heart disease of native coronary artery without angina pectoris: Secondary | ICD-10-CM | POA: Diagnosis not present

## 2018-11-17 DIAGNOSIS — E119 Type 2 diabetes mellitus without complications: Secondary | ICD-10-CM | POA: Diagnosis not present

## 2018-11-17 DIAGNOSIS — F0390 Unspecified dementia without behavioral disturbance: Secondary | ICD-10-CM | POA: Diagnosis not present

## 2018-11-17 DIAGNOSIS — Z9181 History of falling: Secondary | ICD-10-CM | POA: Diagnosis not present

## 2018-11-17 DIAGNOSIS — Z741 Need for assistance with personal care: Secondary | ICD-10-CM | POA: Diagnosis not present

## 2018-11-17 DIAGNOSIS — D649 Anemia, unspecified: Secondary | ICD-10-CM | POA: Diagnosis not present

## 2018-11-17 DIAGNOSIS — I4891 Unspecified atrial fibrillation: Secondary | ICD-10-CM | POA: Diagnosis not present

## 2018-11-17 DIAGNOSIS — R2689 Other abnormalities of gait and mobility: Secondary | ICD-10-CM | POA: Diagnosis not present

## 2018-11-21 DIAGNOSIS — I4891 Unspecified atrial fibrillation: Secondary | ICD-10-CM | POA: Diagnosis not present

## 2018-11-21 DIAGNOSIS — I2510 Atherosclerotic heart disease of native coronary artery without angina pectoris: Secondary | ICD-10-CM | POA: Diagnosis not present

## 2018-11-21 DIAGNOSIS — E119 Type 2 diabetes mellitus without complications: Secondary | ICD-10-CM | POA: Diagnosis not present

## 2018-11-21 DIAGNOSIS — Z741 Need for assistance with personal care: Secondary | ICD-10-CM | POA: Diagnosis not present

## 2018-11-21 DIAGNOSIS — Z9181 History of falling: Secondary | ICD-10-CM | POA: Diagnosis not present

## 2018-11-21 DIAGNOSIS — D649 Anemia, unspecified: Secondary | ICD-10-CM | POA: Diagnosis not present

## 2018-11-21 DIAGNOSIS — F0390 Unspecified dementia without behavioral disturbance: Secondary | ICD-10-CM | POA: Diagnosis not present

## 2018-11-21 DIAGNOSIS — I5032 Chronic diastolic (congestive) heart failure: Secondary | ICD-10-CM | POA: Diagnosis not present

## 2018-11-21 DIAGNOSIS — R2689 Other abnormalities of gait and mobility: Secondary | ICD-10-CM | POA: Diagnosis not present

## 2018-11-22 DIAGNOSIS — Z741 Need for assistance with personal care: Secondary | ICD-10-CM | POA: Diagnosis not present

## 2018-11-22 DIAGNOSIS — I5032 Chronic diastolic (congestive) heart failure: Secondary | ICD-10-CM | POA: Diagnosis not present

## 2018-11-22 DIAGNOSIS — I2510 Atherosclerotic heart disease of native coronary artery without angina pectoris: Secondary | ICD-10-CM | POA: Diagnosis not present

## 2018-11-22 DIAGNOSIS — D649 Anemia, unspecified: Secondary | ICD-10-CM | POA: Diagnosis not present

## 2018-11-22 DIAGNOSIS — E119 Type 2 diabetes mellitus without complications: Secondary | ICD-10-CM | POA: Diagnosis not present

## 2018-11-22 DIAGNOSIS — Z9181 History of falling: Secondary | ICD-10-CM | POA: Diagnosis not present

## 2018-11-22 DIAGNOSIS — I4891 Unspecified atrial fibrillation: Secondary | ICD-10-CM | POA: Diagnosis not present

## 2018-11-22 DIAGNOSIS — F0390 Unspecified dementia without behavioral disturbance: Secondary | ICD-10-CM | POA: Diagnosis not present

## 2018-11-22 DIAGNOSIS — R2689 Other abnormalities of gait and mobility: Secondary | ICD-10-CM | POA: Diagnosis not present

## 2018-11-23 DIAGNOSIS — Z741 Need for assistance with personal care: Secondary | ICD-10-CM | POA: Diagnosis not present

## 2018-11-23 DIAGNOSIS — I4891 Unspecified atrial fibrillation: Secondary | ICD-10-CM | POA: Diagnosis not present

## 2018-11-23 DIAGNOSIS — I5032 Chronic diastolic (congestive) heart failure: Secondary | ICD-10-CM | POA: Diagnosis not present

## 2018-11-23 DIAGNOSIS — R2689 Other abnormalities of gait and mobility: Secondary | ICD-10-CM | POA: Diagnosis not present

## 2018-11-23 DIAGNOSIS — I2510 Atherosclerotic heart disease of native coronary artery without angina pectoris: Secondary | ICD-10-CM | POA: Diagnosis not present

## 2018-11-23 DIAGNOSIS — E119 Type 2 diabetes mellitus without complications: Secondary | ICD-10-CM | POA: Diagnosis not present

## 2018-11-23 DIAGNOSIS — D649 Anemia, unspecified: Secondary | ICD-10-CM | POA: Diagnosis not present

## 2018-11-23 DIAGNOSIS — Z9181 History of falling: Secondary | ICD-10-CM | POA: Diagnosis not present

## 2018-11-23 DIAGNOSIS — F0390 Unspecified dementia without behavioral disturbance: Secondary | ICD-10-CM | POA: Diagnosis not present

## 2018-11-28 DIAGNOSIS — Z7189 Other specified counseling: Secondary | ICD-10-CM | POA: Diagnosis not present

## 2018-11-28 DIAGNOSIS — I5032 Chronic diastolic (congestive) heart failure: Secondary | ICD-10-CM | POA: Diagnosis not present

## 2018-11-28 DIAGNOSIS — R2689 Other abnormalities of gait and mobility: Secondary | ICD-10-CM | POA: Diagnosis not present

## 2018-11-28 DIAGNOSIS — I2510 Atherosclerotic heart disease of native coronary artery without angina pectoris: Secondary | ICD-10-CM | POA: Diagnosis not present

## 2018-11-28 DIAGNOSIS — E119 Type 2 diabetes mellitus without complications: Secondary | ICD-10-CM | POA: Diagnosis not present

## 2018-11-28 DIAGNOSIS — D649 Anemia, unspecified: Secondary | ICD-10-CM | POA: Diagnosis not present

## 2018-11-28 DIAGNOSIS — Z741 Need for assistance with personal care: Secondary | ICD-10-CM | POA: Diagnosis not present

## 2018-11-28 DIAGNOSIS — Z1159 Encounter for screening for other viral diseases: Secondary | ICD-10-CM | POA: Diagnosis not present

## 2018-11-28 DIAGNOSIS — F0390 Unspecified dementia without behavioral disturbance: Secondary | ICD-10-CM | POA: Diagnosis not present

## 2018-11-28 DIAGNOSIS — I4891 Unspecified atrial fibrillation: Secondary | ICD-10-CM | POA: Diagnosis not present

## 2018-11-28 DIAGNOSIS — Z9181 History of falling: Secondary | ICD-10-CM | POA: Diagnosis not present

## 2018-11-30 DIAGNOSIS — H353132 Nonexudative age-related macular degeneration, bilateral, intermediate dry stage: Secondary | ICD-10-CM | POA: Diagnosis not present

## 2018-11-30 DIAGNOSIS — I4891 Unspecified atrial fibrillation: Secondary | ICD-10-CM | POA: Diagnosis not present

## 2018-11-30 DIAGNOSIS — E119 Type 2 diabetes mellitus without complications: Secondary | ICD-10-CM | POA: Diagnosis not present

## 2018-11-30 DIAGNOSIS — I2510 Atherosclerotic heart disease of native coronary artery without angina pectoris: Secondary | ICD-10-CM | POA: Diagnosis not present

## 2018-11-30 DIAGNOSIS — I5032 Chronic diastolic (congestive) heart failure: Secondary | ICD-10-CM | POA: Diagnosis not present

## 2018-11-30 DIAGNOSIS — D649 Anemia, unspecified: Secondary | ICD-10-CM | POA: Diagnosis not present

## 2018-11-30 DIAGNOSIS — R2689 Other abnormalities of gait and mobility: Secondary | ICD-10-CM | POA: Diagnosis not present

## 2018-11-30 DIAGNOSIS — Z741 Need for assistance with personal care: Secondary | ICD-10-CM | POA: Diagnosis not present

## 2018-11-30 DIAGNOSIS — H5201 Hypermetropia, right eye: Secondary | ICD-10-CM | POA: Diagnosis not present

## 2018-11-30 DIAGNOSIS — F0390 Unspecified dementia without behavioral disturbance: Secondary | ICD-10-CM | POA: Diagnosis not present

## 2018-11-30 DIAGNOSIS — Z9181 History of falling: Secondary | ICD-10-CM | POA: Diagnosis not present

## 2018-12-05 DIAGNOSIS — I4891 Unspecified atrial fibrillation: Secondary | ICD-10-CM | POA: Diagnosis not present

## 2018-12-05 DIAGNOSIS — F0390 Unspecified dementia without behavioral disturbance: Secondary | ICD-10-CM | POA: Diagnosis not present

## 2018-12-05 DIAGNOSIS — Z741 Need for assistance with personal care: Secondary | ICD-10-CM | POA: Diagnosis not present

## 2018-12-05 DIAGNOSIS — R2689 Other abnormalities of gait and mobility: Secondary | ICD-10-CM | POA: Diagnosis not present

## 2018-12-05 DIAGNOSIS — I5032 Chronic diastolic (congestive) heart failure: Secondary | ICD-10-CM | POA: Diagnosis not present

## 2018-12-05 DIAGNOSIS — D649 Anemia, unspecified: Secondary | ICD-10-CM | POA: Diagnosis not present

## 2018-12-05 DIAGNOSIS — Z9181 History of falling: Secondary | ICD-10-CM | POA: Diagnosis not present

## 2018-12-05 DIAGNOSIS — I2510 Atherosclerotic heart disease of native coronary artery without angina pectoris: Secondary | ICD-10-CM | POA: Diagnosis not present

## 2018-12-05 DIAGNOSIS — E119 Type 2 diabetes mellitus without complications: Secondary | ICD-10-CM | POA: Diagnosis not present

## 2018-12-07 DIAGNOSIS — R2689 Other abnormalities of gait and mobility: Secondary | ICD-10-CM | POA: Diagnosis not present

## 2018-12-07 DIAGNOSIS — Z741 Need for assistance with personal care: Secondary | ICD-10-CM | POA: Diagnosis not present

## 2018-12-07 DIAGNOSIS — F0390 Unspecified dementia without behavioral disturbance: Secondary | ICD-10-CM | POA: Diagnosis not present

## 2018-12-07 DIAGNOSIS — D649 Anemia, unspecified: Secondary | ICD-10-CM | POA: Diagnosis not present

## 2018-12-07 DIAGNOSIS — Z9181 History of falling: Secondary | ICD-10-CM | POA: Diagnosis not present

## 2018-12-07 DIAGNOSIS — E119 Type 2 diabetes mellitus without complications: Secondary | ICD-10-CM | POA: Diagnosis not present

## 2018-12-07 DIAGNOSIS — I4891 Unspecified atrial fibrillation: Secondary | ICD-10-CM | POA: Diagnosis not present

## 2018-12-07 DIAGNOSIS — I2510 Atherosclerotic heart disease of native coronary artery without angina pectoris: Secondary | ICD-10-CM | POA: Diagnosis not present

## 2018-12-07 DIAGNOSIS — I5032 Chronic diastolic (congestive) heart failure: Secondary | ICD-10-CM | POA: Diagnosis not present

## 2018-12-08 DIAGNOSIS — Z1159 Encounter for screening for other viral diseases: Secondary | ICD-10-CM | POA: Diagnosis not present

## 2018-12-12 DIAGNOSIS — R2689 Other abnormalities of gait and mobility: Secondary | ICD-10-CM | POA: Diagnosis not present

## 2018-12-12 DIAGNOSIS — F0390 Unspecified dementia without behavioral disturbance: Secondary | ICD-10-CM | POA: Diagnosis not present

## 2018-12-12 DIAGNOSIS — I4891 Unspecified atrial fibrillation: Secondary | ICD-10-CM | POA: Diagnosis not present

## 2018-12-12 DIAGNOSIS — Z9181 History of falling: Secondary | ICD-10-CM | POA: Diagnosis not present

## 2018-12-12 DIAGNOSIS — I5032 Chronic diastolic (congestive) heart failure: Secondary | ICD-10-CM | POA: Diagnosis not present

## 2018-12-12 DIAGNOSIS — E119 Type 2 diabetes mellitus without complications: Secondary | ICD-10-CM | POA: Diagnosis not present

## 2018-12-12 DIAGNOSIS — D649 Anemia, unspecified: Secondary | ICD-10-CM | POA: Diagnosis not present

## 2018-12-12 DIAGNOSIS — I2510 Atherosclerotic heart disease of native coronary artery without angina pectoris: Secondary | ICD-10-CM | POA: Diagnosis not present

## 2018-12-12 DIAGNOSIS — Z741 Need for assistance with personal care: Secondary | ICD-10-CM | POA: Diagnosis not present

## 2018-12-13 ENCOUNTER — Telehealth: Payer: Self-pay | Admitting: Diagnostic Neuroimaging

## 2018-12-13 DIAGNOSIS — I5032 Chronic diastolic (congestive) heart failure: Secondary | ICD-10-CM | POA: Diagnosis not present

## 2018-12-13 DIAGNOSIS — Z741 Need for assistance with personal care: Secondary | ICD-10-CM | POA: Diagnosis not present

## 2018-12-13 DIAGNOSIS — Z9181 History of falling: Secondary | ICD-10-CM | POA: Diagnosis not present

## 2018-12-13 DIAGNOSIS — F0390 Unspecified dementia without behavioral disturbance: Secondary | ICD-10-CM | POA: Diagnosis not present

## 2018-12-13 DIAGNOSIS — D649 Anemia, unspecified: Secondary | ICD-10-CM | POA: Diagnosis not present

## 2018-12-13 DIAGNOSIS — E119 Type 2 diabetes mellitus without complications: Secondary | ICD-10-CM | POA: Diagnosis not present

## 2018-12-13 DIAGNOSIS — R2689 Other abnormalities of gait and mobility: Secondary | ICD-10-CM | POA: Diagnosis not present

## 2018-12-13 DIAGNOSIS — I4891 Unspecified atrial fibrillation: Secondary | ICD-10-CM | POA: Diagnosis not present

## 2018-12-13 DIAGNOSIS — I2510 Atherosclerotic heart disease of native coronary artery without angina pectoris: Secondary | ICD-10-CM | POA: Diagnosis not present

## 2018-12-13 NOTE — Telephone Encounter (Addendum)
Attempted to reach wife, [name] Powell for more details. No answer and voice MB not set up. Wife immediately returned called and stated his walking ability has declined, no falls, and  he is showing more agitation and worse memory, dementia signs. She is requesting a sooner FU. His last visit was in April via telephone. We rescheduled the Nov FU for 8/19; I advised they arrive 30 minutes early, bring new insurance and medication list. She asked if she is only person on DPR; I checked and advised she is. [name] Powell  verbalized understanding, appreciation.

## 2018-12-13 NOTE — Telephone Encounter (Signed)
Pt wife is asking for a call from RN, she states pts Parkinson's and dementia is worsening.  Please call

## 2018-12-14 DIAGNOSIS — I5032 Chronic diastolic (congestive) heart failure: Secondary | ICD-10-CM | POA: Diagnosis not present

## 2018-12-14 DIAGNOSIS — I4891 Unspecified atrial fibrillation: Secondary | ICD-10-CM | POA: Diagnosis not present

## 2018-12-14 DIAGNOSIS — R2689 Other abnormalities of gait and mobility: Secondary | ICD-10-CM | POA: Diagnosis not present

## 2018-12-14 DIAGNOSIS — Z9181 History of falling: Secondary | ICD-10-CM | POA: Diagnosis not present

## 2018-12-14 DIAGNOSIS — E119 Type 2 diabetes mellitus without complications: Secondary | ICD-10-CM | POA: Diagnosis not present

## 2018-12-14 DIAGNOSIS — F0390 Unspecified dementia without behavioral disturbance: Secondary | ICD-10-CM | POA: Diagnosis not present

## 2018-12-14 DIAGNOSIS — Z741 Need for assistance with personal care: Secondary | ICD-10-CM | POA: Diagnosis not present

## 2018-12-14 DIAGNOSIS — I2510 Atherosclerotic heart disease of native coronary artery without angina pectoris: Secondary | ICD-10-CM | POA: Diagnosis not present

## 2018-12-14 DIAGNOSIS — D649 Anemia, unspecified: Secondary | ICD-10-CM | POA: Diagnosis not present

## 2018-12-18 ENCOUNTER — Telehealth: Payer: Self-pay | Admitting: Diagnostic Neuroimaging

## 2018-12-18 NOTE — Telephone Encounter (Signed)
Pt wife is asking for a call from [place] calls her either at [phone] or [phone]

## 2018-12-18 NOTE — Telephone Encounter (Signed)
Pt's wife called stating that they are now in [employer] and they are in quarantine and she would like to speak to the RN. She would refused to give the reason why.

## 2018-12-18 NOTE — Telephone Encounter (Signed)
Called wife, Inez Catalina who stated they found an apartment in [place] and had to move in quickly. Due to Covid 19 policies and them being tested and quarantined, she needs to reschedule patient's appointment. We rescheduled, and she verbalized understanding, appreciation.

## 2018-12-19 DIAGNOSIS — G20 Parkinson's disease: Secondary | ICD-10-CM | POA: Diagnosis not present

## 2018-12-19 DIAGNOSIS — N400 Enlarged prostate without lower urinary tract symptoms: Secondary | ICD-10-CM | POA: Diagnosis not present

## 2018-12-19 DIAGNOSIS — E782 Mixed hyperlipidemia: Secondary | ICD-10-CM | POA: Diagnosis not present

## 2018-12-19 DIAGNOSIS — Z8546 Personal history of malignant neoplasm of prostate: Secondary | ICD-10-CM | POA: Diagnosis not present

## 2018-12-19 DIAGNOSIS — I509 Heart failure, unspecified: Secondary | ICD-10-CM | POA: Diagnosis not present

## 2018-12-19 DIAGNOSIS — E1140 Type 2 diabetes mellitus with diabetic neuropathy, unspecified: Secondary | ICD-10-CM | POA: Diagnosis not present

## 2018-12-19 DIAGNOSIS — D649 Anemia, unspecified: Secondary | ICD-10-CM | POA: Diagnosis not present

## 2018-12-19 DIAGNOSIS — E119 Type 2 diabetes mellitus without complications: Secondary | ICD-10-CM | POA: Diagnosis not present

## 2018-12-19 DIAGNOSIS — I10 Essential (primary) hypertension: Secondary | ICD-10-CM | POA: Diagnosis not present

## 2018-12-21 DIAGNOSIS — Z1159 Encounter for screening for other viral diseases: Secondary | ICD-10-CM | POA: Diagnosis not present

## 2018-12-27 ENCOUNTER — Ambulatory Visit: Payer: Self-pay | Admitting: Diagnostic Neuroimaging

## 2019-01-03 ENCOUNTER — Ambulatory Visit (INDEPENDENT_AMBULATORY_CARE_PROVIDER_SITE_OTHER): Payer: Medicare HMO | Admitting: Diagnostic Neuroimaging

## 2019-01-03 ENCOUNTER — Encounter: Payer: Self-pay | Admitting: Diagnostic Neuroimaging

## 2019-01-03 ENCOUNTER — Other Ambulatory Visit: Payer: Self-pay

## 2019-01-03 VITALS — BP 122/62 | HR 62 | Temp 97.7°F | Ht 68.0 in | Wt 175.6 lb

## 2019-01-03 DIAGNOSIS — G20 Parkinson's disease: Secondary | ICD-10-CM | POA: Diagnosis not present

## 2019-01-03 DIAGNOSIS — F0391 Unspecified dementia with behavioral disturbance: Secondary | ICD-10-CM

## 2019-01-03 DIAGNOSIS — R269 Unspecified abnormalities of gait and mobility: Secondary | ICD-10-CM

## 2019-01-03 DIAGNOSIS — E1142 Type 2 diabetes mellitus with diabetic polyneuropathy: Secondary | ICD-10-CM | POA: Diagnosis not present

## 2019-01-03 DIAGNOSIS — F03B18 Unspecified dementia, moderate, with other behavioral disturbance: Secondary | ICD-10-CM

## 2019-01-03 MED ORDER — CARBIDOPA-LEVODOPA 25-100 MG PO TABS: 1.0000 | ORAL_TABLET | Freq: Three times a day (TID) | ORAL | 4 refills | Status: AC

## 2019-01-03 NOTE — Progress Notes (Signed)
[facility]  PATIENT: [name] Powell DOB: 1922-11-14  REFERRING CLINICIAN: Edwin Dada, MD HISTORY FROM: patient, wife REASON FOR VISIT: follow up   HISTORICAL  CHIEF COMPLAINT:  Chief Complaint  Patient presents with  . Parkinson's disease    rm 80, wife - Inez Catalina, grand dgtr-Kristi  MMSE 15, "more confusion, falls"  . Dementia    HISTORY OF PRESENT ILLNESS:   UPDATE (01/03/19, VRP): Since last visit, doing poorly. More ER visits.  Symptoms are progressed. Severity is moderate. No alleviating or aggravating factors. Tolerating meds.    UPDATE (10/04/17, VRP): Since last visit, doing well. Tolerating meds. No alleviating or aggravating factors. No falls.   UPDATE 12/31/16: Since last visit, feels well. Tolerating carb/levo. No major changes. No falls.   UPDATE 07/02/16: Since last visit, doing well. Tolerating carb/levo (10am, 5pm, 10pm). Wife administers meds. No on-off fluctuations. No falls. No injuries. Using cane. Some left sided abd pain x 2 months; no tenderness to palpation. No pain with breathing. No injuries.   UPDATE 12/30/15: Since last visit tried coming off carb/levo, and then had worsening mobility and energy. Now back on carb/levo and doing better.   UPDATE 09/25/15: Since last visit, doing better, except for hosp stay for pneumonia in April 2017. Patient doesn't feel carb/levo is helping, but family think he has more energy now. PT also helping. Now on home O2 since past 1 week.  PRIOR HPI (08/13/15): 85 year old right-handed male here for evaluation of gait instability, memory loss, frequent falling.  For past 2 years patient has had increasing problems with balance, walking, coordination and increasing falls. Over the past 3 weeks patient has had generalized weakness, walking on toes, increasing shuffling gait. Patient also having problems with short-term memory, confusion, difficulty with tasks such as crossword puzzles which he used to be able to do  easily. Patient is a retired Art gallery manager and was very high functioning from intellectual standpoint. Patient has had significant decline from his previous level of functioning. Patient has developed quiet hoarse voice, word finding difficulties, decreased facial expressions. In general he has slowed down quite a bit.  Patient also has constipation and urinary incontinence. He has tendency to punch and kick in his sleep and act out his dreams although this is been going on for more than 25 years according to patient's wife. No change in sense of smell or taste.   REVIEW OF SYSTEMS: Full 14 system review of systems performed and negative with exception of: as per HPI.    ALLERGIES: Allergies  Allergen Reactions  . Lanoxin [Digoxin] Rash    Eyelid rash  . Mexitil [Mexiletine] Other (See Comments)    unknown    HOME MEDICATIONS: Outpatient Medications Prior to Visit  Medication Sig Dispense Refill  . atorvastatin (LIPITOR) 20 MG tablet Take 20 mg by mouth daily.    . carbidopa-levodopa (SINEMET IR) 25-100 MG tablet Take 1 tablet by mouth 3 (three) times daily. 270 tablet 4  . Cholecalciferol (VITAMIN D PO) Take 10,000 Units by mouth every Friday. On Friday     . ELIQUIS 2.5 MG TABS tablet TAKE 1 TABLET TWICE DAILY 180 tablet 1  . furosemide (LASIX) 40 MG tablet Take 1 tablet (40 mg total) by mouth daily. 30 tablet 1  . glimepiride (AMARYL) 4 MG tablet Take 4 mg by mouth daily before breakfast.    . JANUVIA 50 MG tablet Take 1 tablet by mouth every evening.     . metFORMIN (GLUCOPHAGE-XR) 500  MG 24 hr tablet Take 500 mg by mouth daily with breakfast.     . metoprolol tartrate (LOPRESSOR) 50 MG tablet TAKE 2 TABLETS IN THE MORNING  AND TAKE 1 TABLET IN THE EVENING (Patient taking differently: Take 50-100 mg by mouth See admin instructions. Take 2 tablets by mouth in the morning and 1 tablet in the evening) 270 tablet 3  . Multiple Vitamins-Minerals (PRESERVISION AREDS) CAPS Take 1  capsule by mouth 2 (two) times daily.    . polyethylene glycol (MIRALAX / GLYCOLAX) packet Take 17 g by mouth daily. To prevent constipation     . potassium chloride (K-DUR) 10 MEQ tablet TAKE 1 TABLET EVERY DAY 90 tablet 1   No facility-administered medications prior to visit.     PAST MEDICAL HISTORY: Past Medical History:  Diagnosis Date  . Anemia    chronic  . Atrial fibrillation ([place])   . Cancer ([place])   . CHB (complete heart block) ([place])   . Coronary artery disease   . Diabetes mellitus   . Dyslipidemia   . H/O prostate cancer   . Hypertension   . New onset atrial flutter, persistent 07/04/2014  . Pacemaker 12/12/2006   Medtronic adapta  . Pleural effusion, left   . Pneumonia 09/2015  . Prostate cancer ([place])   . 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 Ventricular tachycardia (paroxysmal) ([place]) 03/28/2014    PAST SURGICAL HISTORY: Past Surgical History:  Procedure Laterality Date  . CORONARY ARTERY BYPASS GRAFT  09/04/2001   LIMA to LAD,SVG to diagonal,SVG to ramus intermedius,SVG to PDA  . NM MYOVIEW LTD  01/09/2010   no ischemia  . PACEMAKER INSERTION  12/12/2006   Medtronic adapta  . PPM GENERATOR CHANGEOUT N/A 02/02/2017   Procedure: PPM GENERATOR CHANGEOUT - DUAL CHAMBER;  Surgeon: [name], MD;  Location: [facility];  Service: Cardiovascular;  Laterality: N/A;  . PROSTATE SURGERY  2001   cancer  . TONSILLECTOMY      FAMILY HISTORY: Family History  Problem Relation Age of Onset  . Heart disease Mother   . Heart attack Mother   . Other Father        sepsis  . Other Sister        brain tumor    SOCIAL HISTORY:  Social History   Socioeconomic History  . Marital status: Married    Spouse name: Inez Catalina  . Number of children: 2  . Years of education: 33  . Highest education level: Not on file  Occupational History    Comment: retired Art gallery manager  Social Needs  . Financial resource  strain: Not on file  . Food insecurity    Worry: Not on file    Inability: Not on file  . Transportation needs    Medical: Not on file    Non-medical: Not on file  Tobacco Use  . Smoking status: Former Smoker    Types: Cigarettes, Cigars    Quit date: 05/14/1962    Years since quitting: 26.6  . Smokeless tobacco: Never Used  Substance and Sexual Activity  . Alcohol use: Yes    Comment: seldom  . Drug use: No  . Sexual activity: Not on file  Lifestyle  . Physical activity    Days per week: Not on file    Minutes per session: Not on file  . Stress: Not on file  Relationships  . Social connections  Talks on phone: Not on file    Gets together: Not on file    Attends religious service: Not on file    Active member of club or organization: Not on file    Attends meetings of clubs or organizations: Not on file    Relationship status: Not on file  . Intimate partner violence    Fear of current or ex partner: Not on file    Emotionally abused: Not on file    Physically abused: Not on file    Forced sexual activity: Not on file  Other Topics Concern  . Not on file  Social History Narrative   Lives at home with wife at [employer], caregiver 12 hr daily   Caffeine -seldom     PHYSICAL EXAM  GENERAL EXAM/CONSTITUTIONAL: Vitals:  Vitals:   01/03/19 1251  BP: 122/62  Pulse: 62  Temp: 97.7 F (36.5 C)  Weight: 175 lb 9.6 oz (79.7 kg)  Height: 5\' 8"  (1.727 m)   Body mass index is 26.7 kg/m. No exam data present  Patient is in no distress; well developed, nourished and groomed; neck is supple  CARDIOVASCULAR:  Examination of carotid arteries is normal; no carotid bruits  Regular rate and rhythm, no murmurs  Examination of peripheral vascular system by observation and palpation is normal  EYES:  Ophthalmoscopic exam of optic discs and posterior segments is normal; no papilledema or hemorrhages  MUSCULOSKELETAL:  Gait, strength, tone, movements noted in  Neurologic exam below  NEUROLOGIC: MENTAL STATUS:  MMSE - Mini Mental State Exam 01/03/2019 08/13/2015  Orientation to time 2 5  Orientation to Place 3 5  Registration 3 3  Attention/ Calculation 1 2  Recall 0 2  Language- name 2 objects 2 2  Language- repeat 0 1  Language- follow 3 step command 3 3  Language- read & follow direction 1 1  Write a sentence 0 1  Copy design 0 1  Total score 15 26    awake, alert, oriented to person  DECR memory intact  DECR  attention and concentration  DECR fluent, comprehension intact, naming intact,   fund of knowledge appropriate  CRANIAL NERVE:   2nd - no papilledema on fundoscopic exam  2nd, 3rd, 4th, 6th - pupils equal and reactive to light, visual fields full to confrontation, extraocular muscles intact, no nystagmus  5th - facial sensation symmetric  7th - facial strength symmetric  8th - hearing intact  9th - palate elevates symmetrically, uvula midline  11th - shoulder shrug symmetric  12th - tongue protrusion midline  HOARSE VOICE  MASKED FACIES  MOTOR:   normal bulk  MILD COGWHEELING RIGIDITY IN BUE  MODERATE BRADYKINESIA IN BLE; MILD BRADYKINESIA IN BUE  DIFFUSE 4/5 strength in the BUE, BLE  SENSORY:   normal and symmetric to light touch, temperature  DECR VIB AT TOES AND ANKLES  COORDINATION:   finger-nose-finger, fine finger movements normal  REFLEXES:   deep tendon reflexes TRACE IN BUE; ABSENT IN BLE  POSITIVE MYERSONS  NEGATIVE SNOUT  GAIT/STATION:   UNSTEADY; SLOW TO RISE; STOOPED POSTURE; DECR ARM SWING; SHORT STEPS; HAS SINGLE POINT CANE BUT ABLE TO WALK SHORT DISTANCES WITHOUT CANE    DIAGNOSTIC DATA (LABS, IMAGING, TESTING) - I reviewed patient records, labs, notes, testing and imaging myself where available.  Lab Results  Component Value Date   WBC 7.5 10/25/2018   HGB 8.9 (L) 10/25/2018   HCT 28.3 (L) 10/25/2018   MCV 106.8 (H) 10/25/2018  PLT 178 10/25/2018       Component Value Date/Time   NA 141 10/25/2018 0250   NA 145 (H) 01/28/2017 0859   K 4.0 10/25/2018 0250   CL 105 10/25/2018 0250   CO2 24 10/25/2018 0250   GLUCOSE 124 (H) 10/25/2018 0250   BUN 44 (H) 10/25/2018 0250   BUN 33 01/28/2017 0859   CREATININE 1.76 (H) 10/25/2018 0250   CREATININE 1.16 (H) 10/07/2015 1223   CALCIUM 8.7 (L) 10/25/2018 0250   PROT 6.8 10/23/2018 1632   ALBUMIN 3.3 (L) 10/23/2018 1632   AST 18 10/23/2018 1632   ALT 9 10/23/2018 1632   ALKPHOS 90 10/23/2018 1632   BILITOT 0.9 10/23/2018 1632   GFRNONAA 32 (L) 10/25/2018 0250   GFRAA 38 (L) 10/25/2018 0250   No results found for: CHOL, HDL, LDLCALC, LDLDIRECT, TRIG, CHOLHDL Lab Results  Component Value Date   HGBA1C 8.6 (H) 09/15/2015   Lab Results  Component Value Date   VITAMINB12 1,487 (H) 10/23/2018   Lab Results  Component Value Date   TSH 1.957 07/26/2016    10/13/11 CT head [I reviewed images myself and agree with interpretation. -VRP]  1. No acute intracranial or calvarial findings. 2. Left frontal scalp soft tissue swelling.  12/11/13 TTE - Normal LV size with moderate LV hypertrophy. EF 55% with septal-lateral dyssynchrony probably from pacing and apicalhypokinesis. Mildly dilated RV wtih mildly decreased systolicfunction.  08/25/15 CT head [I reviewed images myself and agree with interpretation. -VRP]  1. Moderate cortical atrophy, essentially unchanged when compared to the CT scan from 10/13/2011. 2. Moderate hypodense changes in the white matter of both hemispheres consistent with chronic microvascular ischemic change, essentially unchanged when compared to the 2013 CT. 3. There are no acute findings.    ASSESSMENT AND PLAN  85 y.o. year old male here with gradual onset progressive balance and gait difficulty, short steps, shuffling gait, masked facies, hoarse voice, word finding difficulties, short-term memory loss, decreased cognitive ability and generalized weakness.  Signs and symptoms are suspicious for neurodegenerative process such as Parkinson's disease. Patient also has signs of diabetic neuropathy may be contributed factors well.    Ddx: parkinsonism + diabetic neuropathy  1. Parkinson's disease ([place])   2. Gait difficulty   3. Diabetic polyneuropathy associated with type 2 diabetes mellitus ([place])   4. Moderate dementia with behavioral disturbance ([place])      PLAN:  PARKINSON'S DISEASE WITH DEMENTIA (progressed) - use rollator walker - continue physical therapy exercises - continue carbidopa-levodopa 1 tab three times a day  - recommend palliative care consult  DIABETIC NEUROPATHY - continue diabetes control per PCP - continue balance exercises  Meds ordered this encounter  Medications  . carbidopa-levodopa (SINEMET IR) 25-100 MG tablet    Sig: Take 1 tablet by mouth 3 (three) times daily.    Dispense:  270 tablet    Refill:  4   Return in about 1 year (around 01/03/2020).    [name], MD 3/61/4431, 5:40 PM Certified in Neurology, Neurophysiology and Neuroimaging  [facility] [address] [place], [place] [address] [phone]

## 2019-01-08 ENCOUNTER — Ambulatory Visit (INDEPENDENT_AMBULATORY_CARE_PROVIDER_SITE_OTHER): Payer: Medicare HMO | Admitting: *Deleted

## 2019-01-08 DIAGNOSIS — I442 Atrioventricular block, complete: Secondary | ICD-10-CM | POA: Diagnosis not present

## 2019-01-11 LAB — CUP PACEART REMOTE DEVICE CHECK
Battery Remaining Longevity: 116 mo
Battery Voltage: 3.01 V
Brady Statistic AP VP Percent: 0 %
Brady Statistic AP VS Percent: 0 %
Brady Statistic AS VP Percent: 95.78 %
Brady Statistic AS VS Percent: 4.22 %
Brady Statistic RA Percent Paced: 0 %
Brady Statistic RV Percent Paced: 95.78 %
Date Time Interrogation Session: 20200902054132
Implantable Lead Implant Date: 20080804
Implantable Lead Implant Date: 20080804
Implantable Lead Location: 753859
Implantable Lead Location: 753860
Implantable Lead Model: 5076
Implantable Lead Model: 5076
Implantable Pulse Generator Implant Date: 20180926
Lead Channel Impedance Value: 304 Ohm
Lead Channel Impedance Value: 342 Ohm
Lead Channel Impedance Value: 361 Ohm
Lead Channel Impedance Value: 418 Ohm
Lead Channel Pacing Threshold Amplitude: 0.75 V
Lead Channel Pacing Threshold Pulse Width: 0.4 ms
Lead Channel Sensing Intrinsic Amplitude: 10.125 mV
Lead Channel Sensing Intrinsic Amplitude: 10.125 mV
Lead Channel Setting Pacing Amplitude: 2.5 V
Lead Channel Setting Pacing Pulse Width: 0.4 ms
Lead Channel Setting Sensing Sensitivity: 4 mV

## 2019-01-16 ENCOUNTER — Encounter: Payer: Self-pay | Admitting: Cardiology

## 2019-01-16 DIAGNOSIS — D649 Anemia, unspecified: Secondary | ICD-10-CM | POA: Diagnosis not present

## 2019-01-16 DIAGNOSIS — E782 Mixed hyperlipidemia: Secondary | ICD-10-CM | POA: Diagnosis not present

## 2019-01-16 DIAGNOSIS — E1165 Type 2 diabetes mellitus with hyperglycemia: Secondary | ICD-10-CM | POA: Diagnosis not present

## 2019-01-16 NOTE — Progress Notes (Signed)
Remote pacemaker transmission.   

## 2019-01-17 DIAGNOSIS — Z950 Presence of cardiac pacemaker: Secondary | ICD-10-CM | POA: Diagnosis not present

## 2019-01-17 DIAGNOSIS — G20 Parkinson's disease: Secondary | ICD-10-CM | POA: Diagnosis not present

## 2019-01-17 DIAGNOSIS — N183 Chronic kidney disease, stage 3 (moderate): Secondary | ICD-10-CM | POA: Diagnosis not present

## 2019-01-17 DIAGNOSIS — R269 Unspecified abnormalities of gait and mobility: Secondary | ICD-10-CM | POA: Diagnosis not present

## 2019-01-17 DIAGNOSIS — I509 Heart failure, unspecified: Secondary | ICD-10-CM | POA: Diagnosis not present

## 2019-01-17 DIAGNOSIS — M6281 Muscle weakness (generalized): Secondary | ICD-10-CM | POA: Diagnosis not present

## 2019-01-17 DIAGNOSIS — E1140 Type 2 diabetes mellitus with diabetic neuropathy, unspecified: Secondary | ICD-10-CM | POA: Diagnosis not present

## 2019-01-17 DIAGNOSIS — E119 Type 2 diabetes mellitus without complications: Secondary | ICD-10-CM | POA: Diagnosis not present

## 2019-01-17 DIAGNOSIS — I2510 Atherosclerotic heart disease of native coronary artery without angina pectoris: Secondary | ICD-10-CM | POA: Diagnosis not present

## 2019-01-18 DIAGNOSIS — E1122 Type 2 diabetes mellitus with diabetic chronic kidney disease: Secondary | ICD-10-CM | POA: Diagnosis not present

## 2019-01-18 DIAGNOSIS — N183 Chronic kidney disease, stage 3 (moderate): Secondary | ICD-10-CM | POA: Diagnosis not present

## 2019-01-18 DIAGNOSIS — Z7984 Long term (current) use of oral hypoglycemic drugs: Secondary | ICD-10-CM | POA: Diagnosis not present

## 2019-01-18 DIAGNOSIS — E1159 Type 2 diabetes mellitus with other circulatory complications: Secondary | ICD-10-CM | POA: Diagnosis not present

## 2019-01-18 DIAGNOSIS — E1165 Type 2 diabetes mellitus with hyperglycemia: Secondary | ICD-10-CM | POA: Diagnosis not present

## 2019-01-18 DIAGNOSIS — I2510 Atherosclerotic heart disease of native coronary artery without angina pectoris: Secondary | ICD-10-CM | POA: Diagnosis not present

## 2019-01-18 DIAGNOSIS — E782 Mixed hyperlipidemia: Secondary | ICD-10-CM | POA: Diagnosis not present

## 2019-01-18 DIAGNOSIS — D649 Anemia, unspecified: Secondary | ICD-10-CM | POA: Diagnosis not present

## 2019-01-18 DIAGNOSIS — E538 Deficiency of other specified B group vitamins: Secondary | ICD-10-CM | POA: Diagnosis not present

## 2019-01-19 DIAGNOSIS — I509 Heart failure, unspecified: Secondary | ICD-10-CM | POA: Diagnosis not present

## 2019-01-19 DIAGNOSIS — G20 Parkinson's disease: Secondary | ICD-10-CM | POA: Diagnosis not present

## 2019-01-19 DIAGNOSIS — N183 Chronic kidney disease, stage 3 (moderate): Secondary | ICD-10-CM | POA: Diagnosis not present

## 2019-01-19 DIAGNOSIS — Z950 Presence of cardiac pacemaker: Secondary | ICD-10-CM | POA: Diagnosis not present

## 2019-01-19 DIAGNOSIS — E119 Type 2 diabetes mellitus without complications: Secondary | ICD-10-CM | POA: Diagnosis not present

## 2019-01-19 DIAGNOSIS — R269 Unspecified abnormalities of gait and mobility: Secondary | ICD-10-CM | POA: Diagnosis not present

## 2019-01-19 DIAGNOSIS — I2510 Atherosclerotic heart disease of native coronary artery without angina pectoris: Secondary | ICD-10-CM | POA: Diagnosis not present

## 2019-01-19 DIAGNOSIS — E1140 Type 2 diabetes mellitus with diabetic neuropathy, unspecified: Secondary | ICD-10-CM | POA: Diagnosis not present

## 2019-01-19 DIAGNOSIS — M6281 Muscle weakness (generalized): Secondary | ICD-10-CM | POA: Diagnosis not present

## 2019-01-23 DIAGNOSIS — Z23 Encounter for immunization: Secondary | ICD-10-CM | POA: Diagnosis not present

## 2019-01-24 DIAGNOSIS — Z950 Presence of cardiac pacemaker: Secondary | ICD-10-CM | POA: Diagnosis not present

## 2019-01-24 DIAGNOSIS — N183 Chronic kidney disease, stage 3 (moderate): Secondary | ICD-10-CM | POA: Diagnosis not present

## 2019-01-24 DIAGNOSIS — E119 Type 2 diabetes mellitus without complications: Secondary | ICD-10-CM | POA: Diagnosis not present

## 2019-01-24 DIAGNOSIS — G20 Parkinson's disease: Secondary | ICD-10-CM | POA: Diagnosis not present

## 2019-01-24 DIAGNOSIS — R269 Unspecified abnormalities of gait and mobility: Secondary | ICD-10-CM | POA: Diagnosis not present

## 2019-01-24 DIAGNOSIS — E1140 Type 2 diabetes mellitus with diabetic neuropathy, unspecified: Secondary | ICD-10-CM | POA: Diagnosis not present

## 2019-01-24 DIAGNOSIS — M6281 Muscle weakness (generalized): Secondary | ICD-10-CM | POA: Diagnosis not present

## 2019-01-24 DIAGNOSIS — I2510 Atherosclerotic heart disease of native coronary artery without angina pectoris: Secondary | ICD-10-CM | POA: Diagnosis not present

## 2019-01-24 DIAGNOSIS — I509 Heart failure, unspecified: Secondary | ICD-10-CM | POA: Diagnosis not present

## 2019-01-26 DIAGNOSIS — I2510 Atherosclerotic heart disease of native coronary artery without angina pectoris: Secondary | ICD-10-CM | POA: Diagnosis not present

## 2019-01-26 DIAGNOSIS — G20 Parkinson's disease: Secondary | ICD-10-CM | POA: Diagnosis not present

## 2019-01-26 DIAGNOSIS — M6281 Muscle weakness (generalized): Secondary | ICD-10-CM | POA: Diagnosis not present

## 2019-01-26 DIAGNOSIS — E119 Type 2 diabetes mellitus without complications: Secondary | ICD-10-CM | POA: Diagnosis not present

## 2019-01-26 DIAGNOSIS — E1140 Type 2 diabetes mellitus with diabetic neuropathy, unspecified: Secondary | ICD-10-CM | POA: Diagnosis not present

## 2019-01-26 DIAGNOSIS — R269 Unspecified abnormalities of gait and mobility: Secondary | ICD-10-CM | POA: Diagnosis not present

## 2019-01-26 DIAGNOSIS — I509 Heart failure, unspecified: Secondary | ICD-10-CM | POA: Diagnosis not present

## 2019-01-26 DIAGNOSIS — Z950 Presence of cardiac pacemaker: Secondary | ICD-10-CM | POA: Diagnosis not present

## 2019-01-26 DIAGNOSIS — N183 Chronic kidney disease, stage 3 (moderate): Secondary | ICD-10-CM | POA: Diagnosis not present

## 2019-01-29 DIAGNOSIS — D0439 Carcinoma in situ of skin of other parts of face: Secondary | ICD-10-CM | POA: Diagnosis not present

## 2019-01-29 DIAGNOSIS — C4442 Squamous cell carcinoma of skin of scalp and neck: Secondary | ICD-10-CM | POA: Diagnosis not present

## 2019-01-29 DIAGNOSIS — C44329 Squamous cell carcinoma of skin of other parts of face: Secondary | ICD-10-CM | POA: Diagnosis not present

## 2019-01-29 DIAGNOSIS — C44519 Basal cell carcinoma of skin of other part of trunk: Secondary | ICD-10-CM | POA: Diagnosis not present

## 2019-01-31 DIAGNOSIS — E119 Type 2 diabetes mellitus without complications: Secondary | ICD-10-CM | POA: Diagnosis not present

## 2019-01-31 DIAGNOSIS — M6281 Muscle weakness (generalized): Secondary | ICD-10-CM | POA: Diagnosis not present

## 2019-01-31 DIAGNOSIS — N183 Chronic kidney disease, stage 3 (moderate): Secondary | ICD-10-CM | POA: Diagnosis not present

## 2019-01-31 DIAGNOSIS — R269 Unspecified abnormalities of gait and mobility: Secondary | ICD-10-CM | POA: Diagnosis not present

## 2019-01-31 DIAGNOSIS — E1140 Type 2 diabetes mellitus with diabetic neuropathy, unspecified: Secondary | ICD-10-CM | POA: Diagnosis not present

## 2019-01-31 DIAGNOSIS — I2510 Atherosclerotic heart disease of native coronary artery without angina pectoris: Secondary | ICD-10-CM | POA: Diagnosis not present

## 2019-01-31 DIAGNOSIS — I509 Heart failure, unspecified: Secondary | ICD-10-CM | POA: Diagnosis not present

## 2019-01-31 DIAGNOSIS — G20 Parkinson's disease: Secondary | ICD-10-CM | POA: Diagnosis not present

## 2019-01-31 DIAGNOSIS — Z950 Presence of cardiac pacemaker: Secondary | ICD-10-CM | POA: Diagnosis not present

## 2019-02-02 DIAGNOSIS — Z950 Presence of cardiac pacemaker: Secondary | ICD-10-CM | POA: Diagnosis not present

## 2019-02-02 DIAGNOSIS — I509 Heart failure, unspecified: Secondary | ICD-10-CM | POA: Diagnosis not present

## 2019-02-02 DIAGNOSIS — I2510 Atherosclerotic heart disease of native coronary artery without angina pectoris: Secondary | ICD-10-CM | POA: Diagnosis not present

## 2019-02-02 DIAGNOSIS — G20 Parkinson's disease: Secondary | ICD-10-CM | POA: Diagnosis not present

## 2019-02-02 DIAGNOSIS — N183 Chronic kidney disease, stage 3 (moderate): Secondary | ICD-10-CM | POA: Diagnosis not present

## 2019-02-02 DIAGNOSIS — E119 Type 2 diabetes mellitus without complications: Secondary | ICD-10-CM | POA: Diagnosis not present

## 2019-02-02 DIAGNOSIS — M6281 Muscle weakness (generalized): Secondary | ICD-10-CM | POA: Diagnosis not present

## 2019-02-02 DIAGNOSIS — E1140 Type 2 diabetes mellitus with diabetic neuropathy, unspecified: Secondary | ICD-10-CM | POA: Diagnosis not present

## 2019-02-02 DIAGNOSIS — R269 Unspecified abnormalities of gait and mobility: Secondary | ICD-10-CM | POA: Diagnosis not present

## 2019-02-05 DIAGNOSIS — Z789 Other specified health status: Secondary | ICD-10-CM | POA: Diagnosis not present

## 2019-02-05 DIAGNOSIS — E119 Type 2 diabetes mellitus without complications: Secondary | ICD-10-CM | POA: Diagnosis not present

## 2019-02-05 DIAGNOSIS — Z7984 Long term (current) use of oral hypoglycemic drugs: Secondary | ICD-10-CM | POA: Diagnosis not present

## 2019-02-05 DIAGNOSIS — G20 Parkinson's disease: Secondary | ICD-10-CM | POA: Diagnosis not present

## 2019-02-05 DIAGNOSIS — Z741 Need for assistance with personal care: Secondary | ICD-10-CM | POA: Diagnosis not present

## 2019-02-05 DIAGNOSIS — R2681 Unsteadiness on feet: Secondary | ICD-10-CM | POA: Diagnosis not present

## 2019-02-05 DIAGNOSIS — F0391 Unspecified dementia with behavioral disturbance: Secondary | ICD-10-CM | POA: Diagnosis not present

## 2019-02-06 DIAGNOSIS — G20 Parkinson's disease: Secondary | ICD-10-CM | POA: Diagnosis not present

## 2019-02-06 DIAGNOSIS — E119 Type 2 diabetes mellitus without complications: Secondary | ICD-10-CM | POA: Diagnosis not present

## 2019-02-06 DIAGNOSIS — E1140 Type 2 diabetes mellitus with diabetic neuropathy, unspecified: Secondary | ICD-10-CM | POA: Diagnosis not present

## 2019-02-06 DIAGNOSIS — I10 Essential (primary) hypertension: Secondary | ICD-10-CM | POA: Diagnosis not present

## 2019-02-06 DIAGNOSIS — R269 Unspecified abnormalities of gait and mobility: Secondary | ICD-10-CM | POA: Diagnosis not present

## 2019-02-06 DIAGNOSIS — Z950 Presence of cardiac pacemaker: Secondary | ICD-10-CM | POA: Diagnosis not present

## 2019-02-06 DIAGNOSIS — M6281 Muscle weakness (generalized): Secondary | ICD-10-CM | POA: Diagnosis not present

## 2019-02-06 DIAGNOSIS — I2510 Atherosclerotic heart disease of native coronary artery without angina pectoris: Secondary | ICD-10-CM | POA: Diagnosis not present

## 2019-02-06 DIAGNOSIS — I509 Heart failure, unspecified: Secondary | ICD-10-CM | POA: Diagnosis not present

## 2019-02-09 DIAGNOSIS — E119 Type 2 diabetes mellitus without complications: Secondary | ICD-10-CM | POA: Diagnosis not present

## 2019-02-09 DIAGNOSIS — R269 Unspecified abnormalities of gait and mobility: Secondary | ICD-10-CM | POA: Diagnosis not present

## 2019-02-09 DIAGNOSIS — I10 Essential (primary) hypertension: Secondary | ICD-10-CM | POA: Diagnosis not present

## 2019-02-09 DIAGNOSIS — G20 Parkinson's disease: Secondary | ICD-10-CM | POA: Diagnosis not present

## 2019-02-09 DIAGNOSIS — Z950 Presence of cardiac pacemaker: Secondary | ICD-10-CM | POA: Diagnosis not present

## 2019-02-09 DIAGNOSIS — E1140 Type 2 diabetes mellitus with diabetic neuropathy, unspecified: Secondary | ICD-10-CM | POA: Diagnosis not present

## 2019-02-09 DIAGNOSIS — I2510 Atherosclerotic heart disease of native coronary artery without angina pectoris: Secondary | ICD-10-CM | POA: Diagnosis not present

## 2019-02-09 DIAGNOSIS — M6281 Muscle weakness (generalized): Secondary | ICD-10-CM | POA: Diagnosis not present

## 2019-02-09 DIAGNOSIS — I509 Heart failure, unspecified: Secondary | ICD-10-CM | POA: Diagnosis not present

## 2019-02-14 DIAGNOSIS — E119 Type 2 diabetes mellitus without complications: Secondary | ICD-10-CM | POA: Diagnosis not present

## 2019-02-14 DIAGNOSIS — Z950 Presence of cardiac pacemaker: Secondary | ICD-10-CM | POA: Diagnosis not present

## 2019-02-14 DIAGNOSIS — M6281 Muscle weakness (generalized): Secondary | ICD-10-CM | POA: Diagnosis not present

## 2019-02-14 DIAGNOSIS — I10 Essential (primary) hypertension: Secondary | ICD-10-CM | POA: Diagnosis not present

## 2019-02-14 DIAGNOSIS — R269 Unspecified abnormalities of gait and mobility: Secondary | ICD-10-CM | POA: Diagnosis not present

## 2019-02-14 DIAGNOSIS — E1140 Type 2 diabetes mellitus with diabetic neuropathy, unspecified: Secondary | ICD-10-CM | POA: Diagnosis not present

## 2019-02-14 DIAGNOSIS — I2510 Atherosclerotic heart disease of native coronary artery without angina pectoris: Secondary | ICD-10-CM | POA: Diagnosis not present

## 2019-02-14 DIAGNOSIS — G20 Parkinson's disease: Secondary | ICD-10-CM | POA: Diagnosis not present

## 2019-02-14 DIAGNOSIS — I509 Heart failure, unspecified: Secondary | ICD-10-CM | POA: Diagnosis not present

## 2019-02-16 DIAGNOSIS — E1140 Type 2 diabetes mellitus with diabetic neuropathy, unspecified: Secondary | ICD-10-CM | POA: Diagnosis not present

## 2019-02-16 DIAGNOSIS — I509 Heart failure, unspecified: Secondary | ICD-10-CM | POA: Diagnosis not present

## 2019-02-16 DIAGNOSIS — M6281 Muscle weakness (generalized): Secondary | ICD-10-CM | POA: Diagnosis not present

## 2019-02-16 DIAGNOSIS — G20 Parkinson's disease: Secondary | ICD-10-CM | POA: Diagnosis not present

## 2019-02-16 DIAGNOSIS — Z950 Presence of cardiac pacemaker: Secondary | ICD-10-CM | POA: Diagnosis not present

## 2019-02-16 DIAGNOSIS — I10 Essential (primary) hypertension: Secondary | ICD-10-CM | POA: Diagnosis not present

## 2019-02-16 DIAGNOSIS — I2510 Atherosclerotic heart disease of native coronary artery without angina pectoris: Secondary | ICD-10-CM | POA: Diagnosis not present

## 2019-02-16 DIAGNOSIS — R269 Unspecified abnormalities of gait and mobility: Secondary | ICD-10-CM | POA: Diagnosis not present

## 2019-02-16 DIAGNOSIS — E119 Type 2 diabetes mellitus without complications: Secondary | ICD-10-CM | POA: Diagnosis not present

## 2019-02-21 DIAGNOSIS — M6281 Muscle weakness (generalized): Secondary | ICD-10-CM | POA: Diagnosis not present

## 2019-02-21 DIAGNOSIS — Z950 Presence of cardiac pacemaker: Secondary | ICD-10-CM | POA: Diagnosis not present

## 2019-02-21 DIAGNOSIS — I509 Heart failure, unspecified: Secondary | ICD-10-CM | POA: Diagnosis not present

## 2019-02-21 DIAGNOSIS — R269 Unspecified abnormalities of gait and mobility: Secondary | ICD-10-CM | POA: Diagnosis not present

## 2019-02-21 DIAGNOSIS — I2510 Atherosclerotic heart disease of native coronary artery without angina pectoris: Secondary | ICD-10-CM | POA: Diagnosis not present

## 2019-02-21 DIAGNOSIS — E119 Type 2 diabetes mellitus without complications: Secondary | ICD-10-CM | POA: Diagnosis not present

## 2019-02-21 DIAGNOSIS — G20 Parkinson's disease: Secondary | ICD-10-CM | POA: Diagnosis not present

## 2019-02-21 DIAGNOSIS — E1140 Type 2 diabetes mellitus with diabetic neuropathy, unspecified: Secondary | ICD-10-CM | POA: Diagnosis not present

## 2019-02-21 DIAGNOSIS — I10 Essential (primary) hypertension: Secondary | ICD-10-CM | POA: Diagnosis not present

## 2019-02-22 ENCOUNTER — Emergency Department (HOSPITAL_COMMUNITY): Payer: Medicare HMO

## 2019-02-22 ENCOUNTER — Inpatient Hospital Stay (HOSPITAL_COMMUNITY)
Admission: EM | Admit: 2019-02-22 | Discharge: 2019-03-01 | DRG: 193 | Disposition: A | Discharge status: Skilled Nursing Facility | Payer: Medicare HMO | Attending: Internal Medicine | Admitting: Internal Medicine

## 2019-02-22 ENCOUNTER — Other Ambulatory Visit: Payer: Self-pay

## 2019-02-22 DIAGNOSIS — I5033 Acute on chronic diastolic (congestive) heart failure: Secondary | ICD-10-CM | POA: Diagnosis present

## 2019-02-22 DIAGNOSIS — N183 Chronic kidney disease, stage 3 unspecified: Secondary | ICD-10-CM | POA: Diagnosis not present

## 2019-02-22 DIAGNOSIS — I2510 Atherosclerotic heart disease of native coronary artery without angina pectoris: Secondary | ICD-10-CM | POA: Diagnosis present

## 2019-02-22 DIAGNOSIS — N179 Acute kidney failure, unspecified: Secondary | ICD-10-CM | POA: Diagnosis not present

## 2019-02-22 DIAGNOSIS — I509 Heart failure, unspecified: Secondary | ICD-10-CM | POA: Diagnosis not present

## 2019-02-22 DIAGNOSIS — I4891 Unspecified atrial fibrillation: Secondary | ICD-10-CM | POA: Diagnosis present

## 2019-02-22 DIAGNOSIS — Z950 Presence of cardiac pacemaker: Secondary | ICD-10-CM

## 2019-02-22 DIAGNOSIS — Z7901 Long term (current) use of anticoagulants: Secondary | ICD-10-CM | POA: Diagnosis not present

## 2019-02-22 DIAGNOSIS — Z7984 Long term (current) use of oral hypoglycemic drugs: Secondary | ICD-10-CM

## 2019-02-22 DIAGNOSIS — N1832 Chronic kidney disease, stage 3b: Secondary | ICD-10-CM | POA: Diagnosis not present

## 2019-02-22 DIAGNOSIS — I130 Hypertensive heart and chronic kidney disease with heart failure and stage 1 through stage 4 chronic kidney disease, or unspecified chronic kidney disease: Secondary | ICD-10-CM | POA: Diagnosis not present

## 2019-02-22 DIAGNOSIS — E785 Hyperlipidemia, unspecified: Secondary | ICD-10-CM | POA: Diagnosis present

## 2019-02-22 DIAGNOSIS — J189 Pneumonia, unspecified organism: Principal | ICD-10-CM | POA: Diagnosis present

## 2019-02-22 DIAGNOSIS — I442 Atrioventricular block, complete: Secondary | ICD-10-CM | POA: Diagnosis present

## 2019-02-22 DIAGNOSIS — Z87891 Personal history of nicotine dependence: Secondary | ICD-10-CM | POA: Diagnosis not present

## 2019-02-22 DIAGNOSIS — I4821 Permanent atrial fibrillation: Secondary | ICD-10-CM | POA: Diagnosis not present

## 2019-02-22 DIAGNOSIS — Z8249 Family history of ischemic heart disease and other diseases of the circulatory system: Secondary | ICD-10-CM | POA: Diagnosis not present

## 2019-02-22 DIAGNOSIS — N184 Chronic kidney disease, stage 4 (severe): Secondary | ICD-10-CM | POA: Diagnosis not present

## 2019-02-22 DIAGNOSIS — E1142 Type 2 diabetes mellitus with diabetic polyneuropathy: Secondary | ICD-10-CM | POA: Diagnosis not present

## 2019-02-22 DIAGNOSIS — Z20828 Contact with and (suspected) exposure to other viral communicable diseases: Secondary | ICD-10-CM | POA: Diagnosis present

## 2019-02-22 DIAGNOSIS — R05 Cough: Secondary | ICD-10-CM | POA: Diagnosis not present

## 2019-02-22 DIAGNOSIS — Z888 Allergy status to other drugs, medicaments and biological substances status: Secondary | ICD-10-CM

## 2019-02-22 DIAGNOSIS — G9340 Encephalopathy, unspecified: Secondary | ICD-10-CM | POA: Diagnosis present

## 2019-02-22 DIAGNOSIS — G20 Parkinson's disease: Secondary | ICD-10-CM

## 2019-02-22 DIAGNOSIS — R509 Fever, unspecified: Secondary | ICD-10-CM | POA: Diagnosis not present

## 2019-02-22 DIAGNOSIS — Z951 Presence of aortocoronary bypass graft: Secondary | ICD-10-CM

## 2019-02-22 DIAGNOSIS — E1122 Type 2 diabetes mellitus with diabetic chronic kidney disease: Secondary | ICD-10-CM | POA: Diagnosis not present

## 2019-02-22 DIAGNOSIS — R531 Weakness: Secondary | ICD-10-CM | POA: Diagnosis not present

## 2019-02-22 DIAGNOSIS — E1165 Type 2 diabetes mellitus with hyperglycemia: Secondary | ICD-10-CM | POA: Diagnosis not present

## 2019-02-22 DIAGNOSIS — R0902 Hypoxemia: Secondary | ICD-10-CM | POA: Diagnosis not present

## 2019-02-22 DIAGNOSIS — R410 Disorientation, unspecified: Secondary | ICD-10-CM | POA: Diagnosis not present

## 2019-02-22 DIAGNOSIS — E119 Type 2 diabetes mellitus without complications: Secondary | ICD-10-CM

## 2019-02-22 DIAGNOSIS — Z8546 Personal history of malignant neoplasm of prostate: Secondary | ICD-10-CM | POA: Diagnosis not present

## 2019-02-22 DIAGNOSIS — I5032 Chronic diastolic (congestive) heart failure: Secondary | ICD-10-CM | POA: Diagnosis present

## 2019-02-22 DIAGNOSIS — Z209 Contact with and (suspected) exposure to unspecified communicable disease: Secondary | ICD-10-CM | POA: Diagnosis not present

## 2019-02-22 DIAGNOSIS — E1121 Type 2 diabetes mellitus with diabetic nephropathy: Secondary | ICD-10-CM | POA: Diagnosis not present

## 2019-02-22 DIAGNOSIS — I10 Essential (primary) hypertension: Secondary | ICD-10-CM | POA: Diagnosis present

## 2019-02-22 DIAGNOSIS — G20A1 Parkinson's disease without dyskinesia, without mention of fluctuations: Secondary | ICD-10-CM | POA: Diagnosis present

## 2019-02-22 LAB — CBC WITH DIFFERENTIAL/PLATELET
Abs Immature Granulocytes: 0.14 10*3/uL — ABNORMAL HIGH (ref 0.00–0.07)
Basophils Absolute: 0 10*3/uL (ref 0.0–0.1)
Basophils Relative: 0 %
Eosinophils Absolute: 0 10*3/uL (ref 0.0–0.5)
Eosinophils Relative: 0 %
HCT: 29.1 % — ABNORMAL LOW (ref 39.0–52.0)
Hemoglobin: 9.1 g/dL — ABNORMAL LOW (ref 13.0–17.0)
Immature Granulocytes: 1 %
Lymphocytes Relative: 9 %
Lymphs Abs: 1.1 10*3/uL (ref 0.7–4.0)
MCH: 34.5 pg — ABNORMAL HIGH (ref 26.0–34.0)
MCHC: 31.3 g/dL (ref 30.0–36.0)
MCV: 110.2 fL — ABNORMAL HIGH (ref 80.0–100.0)
Monocytes Absolute: 3.3 10*3/uL — ABNORMAL HIGH (ref 0.1–1.0)
Monocytes Relative: 27 %
Neutro Abs: 7.6 10*3/uL (ref 1.7–7.7)
Neutrophils Relative %: 63 %
Platelets: 136 10*3/uL — ABNORMAL LOW (ref 150–400)
RBC: 2.64 MIL/uL — ABNORMAL LOW (ref 4.22–5.81)
RDW: 16.5 % — ABNORMAL HIGH (ref 11.5–15.5)
WBC: 12.2 10*3/uL — ABNORMAL HIGH (ref 4.0–10.5)
nRBC: 0 % (ref 0.0–0.2)

## 2019-02-22 LAB — COMPREHENSIVE METABOLIC PANEL
ALT: 7 U/L (ref 0–44)
AST: 14 U/L — ABNORMAL LOW (ref 15–41)
Albumin: 3.1 g/dL — ABNORMAL LOW (ref 3.5–5.0)
Alkaline Phosphatase: 102 U/L (ref 38–126)
Anion gap: 11 (ref 5–15)
BUN: 46 mg/dL — ABNORMAL HIGH (ref 8–23)
CO2: 23 mmol/L (ref 22–32)
Calcium: 8.5 mg/dL — ABNORMAL LOW (ref 8.9–10.3)
Chloride: 103 mmol/L (ref 98–111)
Creatinine, Ser: 2.07 mg/dL — ABNORMAL HIGH (ref 0.61–1.24)
GFR calc Af Amer: 31 mL/min — ABNORMAL LOW (ref >60)
GFR calc non Af Amer: 27 mL/min — ABNORMAL LOW (ref >60)
Glucose, Bld: 259 mg/dL — ABNORMAL HIGH (ref 70–99)
Potassium: 4.4 mmol/L (ref 3.5–5.1)
Sodium: 137 mmol/L (ref 135–145)
Total Bilirubin: 0.8 mg/dL (ref 0.3–1.2)
Total Protein: 6.7 g/dL (ref 6.5–8.1)

## 2019-02-22 LAB — LACTIC ACID, PLASMA: Lactic Acid, Venous: 2.2 mmol/L (ref 0.5–1.9)

## 2019-02-22 MED ORDER — APIXABAN 2.5 MG PO TABS
2.5000 mg | ORAL_TABLET | Freq: Two times a day (BID) | ORAL | Status: DC
  Administered 2019-02-23 – 2019-03-01 (×13): 2.5 mg via ORAL
  Filled 2019-02-22 (×14): qty 1

## 2019-02-22 MED ORDER — ONDANSETRON HCL 4 MG PO TABS: 4.0000 mg | ORAL_TABLET | Freq: Four times a day (QID) | ORAL | Status: DC | PRN

## 2019-02-22 MED ORDER — CARBIDOPA-LEVODOPA 25-100 MG PO TABS
1.0000 | ORAL_TABLET | Freq: Three times a day (TID) | ORAL | Status: DC
  Administered 2019-02-23 – 2019-03-01 (×19): 1 via ORAL
  Filled 2019-02-22 (×23): qty 1

## 2019-02-22 MED ORDER — SODIUM CHLORIDE 0.9 % IV SOLN
500.0000 mg | INTRAVENOUS | Status: DC
  Administered 2019-02-22 – 2019-02-26 (×3): 500 mg via INTRAVENOUS
  Filled 2019-02-22 (×6): qty 500

## 2019-02-22 MED ORDER — INSULIN ASPART 100 UNIT/ML ~~LOC~~ SOLN
0.0000 [IU] | Freq: Three times a day (TID) | SUBCUTANEOUS | Status: DC
  Administered 2019-02-23: 3 [IU] via SUBCUTANEOUS
  Administered 2019-02-24: 2 [IU] via SUBCUTANEOUS
  Administered 2019-02-24 – 2019-02-25 (×2): 3 [IU] via SUBCUTANEOUS
  Administered 2019-02-26 (×2): 2 [IU] via SUBCUTANEOUS
  Administered 2019-02-27: 3 [IU] via SUBCUTANEOUS
  Administered 2019-02-28: 5 [IU] via SUBCUTANEOUS

## 2019-02-22 MED ORDER — ACETAMINOPHEN 325 MG PO TABS: 650.0000 mg | ORAL_TABLET | Freq: Four times a day (QID) | ORAL | Status: DC | PRN

## 2019-02-22 MED ORDER — SODIUM CHLORIDE 0.9 % IV SOLN
2.0000 g | INTRAVENOUS | Status: DC
  Administered 2019-02-22 – 2019-02-26 (×4): 2 g via INTRAVENOUS
  Filled 2019-02-22 (×2): qty 2
  Filled 2019-02-22 (×2): qty 20
  Filled 2019-02-22: qty 2

## 2019-02-22 MED ORDER — SODIUM CHLORIDE 0.9 % IV BOLUS
500.0000 mL | Freq: Once | INTRAVENOUS | Status: AC
  Administered 2019-02-22: 500 mL via INTRAVENOUS

## 2019-02-22 MED ORDER — ONDANSETRON HCL 4 MG/2ML IJ SOLN: 4.0000 mg | Freq: Four times a day (QID) | INTRAMUSCULAR | Status: DC | PRN

## 2019-02-22 MED ORDER — METOPROLOL TARTRATE 25 MG PO TABS: 100.0000 mg | ORAL_TABLET | Freq: Every day | ORAL | Status: DC

## 2019-02-22 MED ORDER — SODIUM CHLORIDE 0.9 % IV BOLUS: 1000.0000 mL | Freq: Once | INTRAVENOUS | Status: DC

## 2019-02-22 MED ORDER — ACETAMINOPHEN 650 MG RE SUPP: 650.0000 mg | Freq: Four times a day (QID) | RECTAL | Status: DC | PRN

## 2019-02-22 MED ORDER — INSULIN ASPART 100 UNIT/ML ~~LOC~~ SOLN: 0.0000 [IU] | Freq: Every day | SUBCUTANEOUS | Status: DC

## 2019-02-22 NOTE — ED Notes (Signed)
Notified EDP's and CN that this pt needs a Air cabin crew. He is removing equipment and clothing, getting out of bed and is very confused. He is not very redirectable.

## 2019-02-22 NOTE — ED Notes (Signed)
Date and time results received: 02/22/19  (use smartphrase ".now" to insert current time)  Test: lactic Critical Value: 2.2  Name of Provider Notified: Julianne Handler, PA  Orders Received? Or Actions Taken?: new orders placed

## 2019-02-22 NOTE — ED Provider Notes (Addendum)
[facility] EMERGENCY DEPARTMENT Provider Note   CSN: [id number] Arrival date & time: 02/22/19  2024     History   Chief Complaint Chief Complaint  Patient presents with  . Weakness  . Fever    HPI [name] Powell is a 85 y.o. male with history of hypertension, diabetes, atrial fibrillation anticoagulated on Eliquis, CAD, complete heart block with pacer, Parkinson's who presents with new onset fever, generalized weakness and decreased appetite.  [facility] in [facility] living facility[name] with his wife.  She reports that he had 2 episodes of coughing up pink-tinged sputum.  Patient had a temp of 100.9 with EMS and they gave 1000 mg of Tylenol.  Patient's wife states that he has not been acting himself and being more quiet than normal, although with his Parkinson's and dementia, this is somewhat normal.  Patient denies any pain anywhere, but otherwise is unable to provide much history.     HPI  Past Medical History:  Diagnosis Date  . Anemia    chronic  . Atrial fibrillation ([place])   . Cancer ([place])   . CHB (complete heart block) ([place])   . Coronary artery disease   . Diabetes mellitus   . Dyslipidemia   . H/O prostate cancer   . Hypertension   . New onset atrial flutter, persistent 07/04/2014  . Pacemaker 12/12/2006   Medtronic adapta  . Pleural effusion, left   . Pneumonia 09/2015  . Prostate cancer ([place])   . 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 Ventricular tachycardia (paroxysmal) ([place]) 03/28/2014    Patient Active Problem List   Diagnosis Date Noted  . Symptomatic anemia 10/23/2018  . Pacemaker battery depletion 02/02/2017  . CHF (congestive heart failure) ([place]) 07/27/2016  . Weakness generalized 07/27/2016  . Generalized weakness 07/27/2016  . Long term current use of anticoagulant 01/16/2016  . Acute respiratory failure with hypoxia ([place]) 09/15/2015  . Acute on chronic  diastolic congestive heart failure ([place]) 09/15/2015  . CKD (chronic kidney disease), stage III ([place]) 09/15/2015  . Atrial fibrillation ([place])   . Pleural effusion, left   . Acute diastolic (congestive) heart failure ([place])   . Community acquired pneumonia 02/19/2015  . CAP (community acquired pneumonia) 02/19/2015  . Atrial flutter ([place]) 01/20/2015  . New onset atrial flutter, persistent 07/04/2014  . Ventricular tachycardia (paroxysmal) ([place]) 03/28/2014  . Chronic diastolic congestive heart failure ([place]) 01/02/2014  . Pacemaker dependent - Medtronic 01/10/2013  . CAD s/p CABG 2003 01/10/2013  . Sinus arrest 01/10/2013  . CHB (complete heart block) ([place]) 01/10/2013  . DM2 (diabetes mellitus, type 2) ([place]) 01/10/2013  . Dyslipidemia 01/10/2013  . HTN (hypertension) 01/10/2013    Past Surgical History:  Procedure Laterality Date  . CORONARY ARTERY BYPASS GRAFT  09/04/2001   LIMA to LAD,SVG to diagonal,SVG to ramus intermedius,SVG to PDA  . NM MYOVIEW LTD  01/09/2010   no ischemia  . PACEMAKER INSERTION  12/12/2006   Medtronic adapta  . PPM GENERATOR CHANGEOUT N/A 02/02/2017   Procedure: PPM GENERATOR CHANGEOUT - DUAL CHAMBER;  Surgeon: [name], MD;  Location: [facility];  Service: Cardiovascular;  Laterality: N/A;  . PROSTATE SURGERY  2001   cancer  . TONSILLECTOMY          Home Medications    Prior to Admission medications   Medication Sig Start Date End Date Taking? Authorizing Provider  atorvastatin (  LIPITOR) 20 MG tablet Take 20 mg by mouth daily.    [provider]  carbidopa-levodopa (SINEMET IR) 25-100 MG tablet Take 1 tablet by mouth 3 (three) times daily. 01/03/19   Penumalli, Earlean Polka, MD  Cholecalciferol (VITAMIN D PO) Take 10,000 Units by mouth every Friday. On Friday     [provider]  ELIQUIS 2.5 MG TABS tablet TAKE 1 TABLET TWICE DAILY 10/26/18   Croitoru, Mihai, MD  furosemide (LASIX) 40 MG tablet Take 1 tablet (40 mg total) by  mouth daily. 10/26/18   Black, Lezlie Octave, NP  glimepiride (AMARYL) 4 MG tablet Take 4 mg by mouth daily before breakfast.    [provider]  JANUVIA 50 MG tablet Take 1 tablet by mouth every evening.  05/27/15   [provider]  metFORMIN (GLUCOPHAGE-XR) 500 MG 24 hr tablet Take 500 mg by mouth daily with breakfast.  02/10/15   [provider]  metoprolol tartrate (LOPRESSOR) 50 MG tablet TAKE 2 TABLETS IN THE MORNING  AND TAKE 1 TABLET IN THE EVENING Patient taking differently: Take 50-100 mg by mouth See admin instructions. Take 2 tablets by mouth in the morning and 1 tablet in the evening 05/29/18   Croitoru, Mihai, MD  Multiple Vitamins-Minerals (PRESERVISION AREDS) CAPS Take 1 capsule by mouth 2 (two) times daily.    [provider]  polyethylene glycol (MIRALAX / GLYCOLAX) packet Take 17 g by mouth daily. To prevent constipation     [provider]  potassium chloride (K-DUR) 10 MEQ tablet TAKE 1 TABLET EVERY DAY 11/06/18   Croitoru, Mihai, MD    Family History Family History  Problem Relation Age of Onset  . Heart disease Mother   . Heart attack Mother   . Other Father        sepsis  . Other Sister        brain tumor    Social History Social History   Tobacco Use  . Smoking status: Former Smoker    Types: Cigarettes, Cigars    Quit date: 05/14/1962    Years since quitting: 56.8  . Smokeless tobacco: Never Used  Substance Use Topics  . Alcohol use: Yes    Comment: seldom  . Drug use: No     Allergies   Lanoxin [digoxin] and Mexitil [mexiletine]   Review of Systems Review of Systems  Unable to perform ROS: Dementia     Physical Exam Updated Vital Signs BP (!) 157/79 (BP Location: Right Arm)   Pulse 61   Temp 99.1 F (37.3 C) (Oral)   Resp (!) 21   Ht 5\' 8"  (1.727 m)   Wt 81.6 kg   SpO2 97%   BMI 27.37 kg/m   Physical Exam Vitals signs and nursing note reviewed.  Constitutional:      General: He is not in acute  distress.    Appearance: He is well-developed. He is not diaphoretic.  HENT:     Head: Normocephalic and atraumatic.     Mouth/Throat:     Pharynx: No oropharyngeal exudate.  Eyes:     General: No scleral icterus.       Right eye: No discharge.        Left eye: No discharge.     Conjunctiva/sclera: Conjunctivae normal.     Pupils: Pupils are equal, round, and reactive to light.  Neck:     Musculoskeletal: Normal range of motion and neck supple.     Thyroid: No thyromegaly.  Cardiovascular:  Rate and Rhythm: Normal rate and regular rhythm.     Heart sounds: Normal heart sounds. No murmur. No friction rub. No gallop.   Pulmonary:     Effort: Pulmonary effort is normal. No respiratory distress.     Breath sounds: Normal breath sounds. No stridor. No wheezing or rales.  Abdominal:     General: Bowel sounds are normal. There is no distension.     Palpations: Abdomen is soft.     Tenderness: There is no abdominal tenderness. There is no guarding or rebound.  Lymphadenopathy:     Cervical: No cervical adenopathy.  Skin:    General: Skin is warm and dry.     Coloration: Skin is not pale.     Findings: No rash.  Neurological:     Mental Status: He is alert.     Coordination: Coordination normal.      ED Treatments / Results  Labs (all labs ordered are listed, but only abnormal results are displayed) Labs Reviewed  COMPREHENSIVE METABOLIC PANEL - Abnormal; Notable for the following components:      Result Value   Glucose, Bld 259 (*)    BUN 46 (*)    Creatinine, Ser 2.07 (*)    Calcium 8.5 (*)    Albumin 3.1 (*)    AST 14 (*)    GFR calc non Af Amer 27 (*)    GFR calc Af Amer 31 (*)    All other components within normal limits  CBC WITH DIFFERENTIAL/PLATELET - Abnormal; Notable for the following components:   WBC 12.2 (*)    RBC 2.64 (*)    Hemoglobin 9.1 (*)    HCT 29.1 (*)    MCV 110.2 (*)    MCH 34.5 (*)    RDW 16.5 (*)    Platelets 136 (*)    Monocytes  Absolute 3.3 (*)    Abs Immature Granulocytes 0.14 (*)    All other components within normal limits  LACTIC ACID, PLASMA - Abnormal; Notable for the following components:   Lactic Acid, Venous 2.2 (*)    All other components within normal limits  CULTURE, BLOOD (ROUTINE X 2)  CULTURE, BLOOD (ROUTINE X 2)  SARS CORONAVIRUS 2 (TAT 6-24 HRS)  URINE CULTURE  LACTIC ACID, PLASMA  URINALYSIS, ROUTINE W REFLEX MICROSCOPIC  APTT  PROTIME-INR    EKG EKG Interpretation  Date/Time:  Thursday February 22 2019 20:54:22 EDT Ventricular Rate:  60 PR Interval:    QRS Duration: 190 QT Interval:  493 QTC Calculation: 493 R Axis:   -81 Text Interpretation:  Ventricular-paced rhythm Confirmed by Quintella Reichert [phone]) on 02/22/2019 9:21:03 PM   Radiology Dg Chest Portable 1 View  Result Date: 02/22/2019 CLINICAL DATA:  Fever and cough EXAM: PORTABLE CHEST 1 VIEW COMPARISON:  10/23/2018 FINDINGS: Post sternotomy changes. Left-sided pacing device as before. Small left-sided pleural effusion. Patchy airspace disease at the bases. Enlarged cardiomediastinal silhouette with central vascular congestion. Aortic atherosclerosis. No pneumothorax. IMPRESSION: 1. Patchy basilar airspace disease which may reflect atelectasis or minimal infiltrates 2. Cardiomegaly with slight central congestion. Possible small left effusion Electronically Signed   By: Donavan Foil M.D.   On: 02/22/2019 21:32    Procedures Procedures (including critical care time)  Medications Ordered in ED Medications  cefTRIAXone (ROCEPHIN) 2 g in sodium chloride 0.9 % 100 mL IVPB (2 g Intravenous New Bag/Given 02/22/19 2237)  azithromycin (ZITHROMAX) 500 mg in sodium chloride 0.9 % 250 mL IVPB (500 mg Intravenous New Bag/Given  02/22/19 2257)  sodium chloride 0.9 % bolus 500 mL (500 mLs Intravenous New Bag/Given 02/22/19 2219)     Initial Impression / Assessment and Plan / ED Course  I have reviewed the triage vital signs and the  nursing notes.  Pertinent labs & imaging results that were available during my care of the patient were reviewed by me and considered in my medical decision making (see chart for details).        Patient presenting with fever, cough, decreased appetite, generalized weakness over the past few days.  Patient found to have patchy infiltrates on chest x-ray with a leukocytosis of 12.2.  Renal function stable for the patient.  Lactic 2.2.  Blood culture sent.  Gentle fluids given considering patient's CHF status.  Initiated IV antibiotics and code sepsis called.  I discussed patient case with Dr. Alcario Drought with [facility] who accepts patient for admission.  I discussed patient case with my attending, Dr. Ralene Bathe, who guided the patient's management and agrees with plan.  Patient's wife [name] Powell can be reached at [phone] or [phone]. She would like to be kept updated and visit if COVID-19 result returns negative. She reported that he is lost without her and he gets confused or agitated, talking to her over the phone may help.  Final Clinical Impressions(s) / ED Diagnoses   Final diagnoses:  Community acquired pneumonia, unspecified laterality    ED Discharge Orders    None            Frederica Kuster, Hershal Coria 02/22/19 2347    Quintella Reichert, MD 02/23/19 0001

## 2019-02-22 NOTE — ED Triage Notes (Signed)
Pt arrives via EMS from [facility] due to c/o of new onset weakness, fever, and decreased appetite per EMS. EMS also reports that patients caregiver on scene stated that patient had 1 episode of coughing up pink-tinged sputum.   Temp was 100.9 with ems and patient received 1000mg  po.   NEGATIVE covid test 1 week ago. Hx of dementia

## 2019-02-22 NOTE — H&P (Signed)
History and Physical    [name] GHW:[id number] DOB: 10-27-1922 DOA: 02/22/2019  PCP: [name], MD  Patient coming from: ALF  I have personally briefly reviewed patient's old medical records in [place]  Chief Complaint: Fever, cough  HPI: [name] Powell is a 85 y.o. male with medical history significant of HTN, DM2, A.Fib on eliquis, CHB s/p PPM, parkinson's.  Patient presents to ED with fever, generalized weakness, decreased appetite, and cough.  Lives in ALF with his wife.  Wife reports 2 episodes of coughing up pink-tinged sputum.  Temp 100.9 with EMS, got 1g tylenol.  Patient's wife states that he has not been acting himself and being more quiet than normal, although with his Parkinson's and dementia, he doesn't say too much at baseline.  Pt denies pain anywhere or any specific complaints, but cant provide much history.   ED Course: CXR shows bibasilar actelectasis vs infiltrate.  WBC 12.2k, O2 sat is mid 90s on RA and no respiratory distress.  Creat 2.0 and BUN 46 (which is about baseline, maybe slightly up).   Review of Systems: As per HPI, otherwise all review of systems negative.  Pt unable to provide much history though.  Past Medical History:  Diagnosis Date  . Anemia    chronic  . Atrial fibrillation ([place])   . Cancer ([name] Powell)   . CHB (complete heart block) ([name] Powell)   . Coronary artery disease   . Diabetes mellitus   . Dyslipidemia   . H/O prostate cancer   . Hypertension   . New onset atrial flutter, persistent 07/04/2014  . Pacemaker 12/12/2006   Medtronic adapta  . Pleural effusion, left   . Pneumonia 09/2015  . Prostate cancer ([name] Powell)   . 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 Ventricular tachycardia (paroxysmal) ([name] Powell) 03/28/2014    Past Surgical History:  Procedure Laterality Date  . CORONARY ARTERY BYPASS GRAFT  09/04/2001   LIMA to LAD,SVG to diagonal,SVG to ramus  intermedius,SVG to PDA  . NM MYOVIEW LTD  01/09/2010   no ischemia  . PACEMAKER INSERTION  12/12/2006   Medtronic adapta  . PPM GENERATOR CHANGEOUT N/A 02/02/2017   Procedure: PPM GENERATOR CHANGEOUT - DUAL CHAMBER;  Surgeon: [name] Klein, MD;  Location: [facility];  Service: Cardiovascular;  Laterality: N/A;  . PROSTATE SURGERY  2001   cancer  . TONSILLECTOMY       reports that he quit smoking about 56 years ago. His smoking use included cigarettes and cigars. He has never used smokeless tobacco. He reports current alcohol use. He reports that he does not use drugs.  Allergies  Allergen Reactions  . Milk-Related Compounds   . Lanoxin [Digoxin] Rash    Eyelid rash  . Mexitil [Mexiletine] Other (See Comments)    unknown    Family History  Problem Relation Age of Onset  . Heart disease Mother   . Heart attack Mother   . Other Father        sepsis  . Other Sister        brain tumor     Prior to Admission medications   Medication Sig Start Date End Date Taking? Authorizing Provider  atorvastatin (LIPITOR) 20 MG tablet Take 20 mg by mouth daily.    [provider]  carbidopa-levodopa (SINEMET IR) 25-100 MG tablet Take 1 tablet by mouth 3 (three) times daily. 01/03/19   Penumalli, Earlean Polka,  MD  Cholecalciferol (VITAMIN D PO) Take 10,000 Units by mouth every Friday. On Friday     [provider]  ELIQUIS 2.5 MG TABS tablet TAKE 1 TABLET TWICE DAILY 10/26/18   Croitoru, Mihai, MD  furosemide (LASIX) 40 MG tablet Take 1 tablet (40 mg total) by mouth daily. 10/26/18   Black, Lezlie Octave, NP  glimepiride (AMARYL) 4 MG tablet Take 4 mg by mouth daily before breakfast.    [provider]  JANUVIA 50 MG tablet Take 1 tablet by mouth every evening.  05/27/15   [provider]  metFORMIN (GLUCOPHAGE-XR) 500 MG 24 hr tablet Take 500 mg by mouth daily with breakfast.  02/10/15   [provider]  metoprolol tartrate (LOPRESSOR) 50 MG tablet TAKE 2  TABLETS IN THE MORNING  AND TAKE 1 TABLET IN THE EVENING Patient taking differently: Take 50-100 mg by mouth See admin instructions. Take 2 tablets by mouth in the morning and 1 tablet in the evening 05/29/18   Croitoru, Mihai, MD  Multiple Vitamins-Minerals (PRESERVISION AREDS) CAPS Take 1 capsule by mouth 2 (two) times daily.    [provider]  polyethylene glycol (MIRALAX / GLYCOLAX) packet Take 17 g by mouth daily. To prevent constipation     [provider]  potassium chloride (K-DUR) 10 MEQ tablet TAKE 1 TABLET EVERY DAY 11/06/18   Croitoru, [name] Gobble, MD    Physical Exam: Vitals:   02/22/19 2039 02/22/19 2100 02/22/19 2211 02/22/19 2245  BP:   (!) 157/79   Pulse:  60 64 61  Resp:  19 (!) 22 (!) 21  Temp:      TempSrc:   Oral   SpO2:  94% 93% 97%  Weight: 81.6 kg     Height: 5\' 8"  (1.727 m)       Constitutional: NAD, calm, comfortable Eyes: PERRL, lids and conjunctivae normal ENMT: Mucous membranes are moist. Posterior pharynx clear of any exudate or lesions.Normal dentition.  Neck: normal, supple, no masses, no thyromegaly Respiratory: clear to auscultation bilaterally, no wheezing, no crackles. Normal respiratory effort. No accessory muscle use.  Cardiovascular: Regular rate and rhythm, no murmurs / rubs / gallops. No extremity edema. 2+ pedal pulses. No carotid bruits.  Abdomen: no tenderness, no masses palpated. No hepatosplenomegaly. Bowel sounds positive.  Musculoskeletal: no clubbing / cyanosis. No joint deformity upper and lower extremities. Good ROM, no contractures. Normal muscle tone.  Skin: no rashes, lesions, ulcers. No induration Neurologic: CN 2-12 grossly intact. Sensation intact, DTR normal. Strength 5/5 in all 4.  Psychiatric: Alert, answers questions but doesn't contribute much to history, was apparently confused and trying to climb out of bed earlier per report.   Labs on Admission: I have personally reviewed following labs and imaging studies   CBC: Recent Labs  Lab 02/22/19 2046  WBC 12.2*  NEUTROABS 7.6  HGB 9.1*  HCT 29.1*  MCV 110.2*  PLT 509*   Basic Metabolic Panel: Recent Labs  Lab 02/22/19 2046  NA 137  K 4.4  CL 103  CO2 23  GLUCOSE 259*  BUN 46*  CREATININE 2.07*  CALCIUM 8.5*   GFR: Estimated Creatinine Clearance: 21.1 mL/min (A) (by C-G formula based on SCr of 2.07 mg/dL (H)). Liver Function Tests: Recent Labs  Lab 02/22/19 2046  AST 14*  ALT 7  ALKPHOS 102  BILITOT 0.8  PROT 6.7  ALBUMIN 3.1*   No results for input(s): LIPASE, AMYLASE in the last 168 hours. No results for input(s): AMMONIA in  the last 168 hours. Coagulation Profile: No results for input(s): INR, PROTIME in the last 168 hours. Cardiac Enzymes: No results for input(s): CKTOTAL, CKMB, CKMBINDEX, TROPONINI in the last 168 hours. BNP (last 3 results) No results for input(s): PROBNP in the last 8760 hours. HbA1C: No results for input(s): HGBA1C in the last 72 hours. CBG: No results for input(s): GLUCAP in the last 168 hours. Lipid Profile: No results for input(s): CHOL, HDL, LDLCALC, TRIG, CHOLHDL, LDLDIRECT in the last 72 hours. Thyroid Function Tests: No results for input(s): TSH, T4TOTAL, FREET4, T3FREE, THYROIDAB in the last 72 hours. Anemia Panel: No results for input(s): VITAMINB12, FOLATE, FERRITIN, TIBC, IRON, RETICCTPCT in the last 72 hours. Urine analysis:    Component Value Date/Time   COLORURINE STRAW (A) 10/17/2018 1715   APPEARANCEUR CLEAR 10/17/2018 1715   LABSPEC 1.012 10/17/2018 1715   PHURINE 5.0 10/17/2018 1715   GLUCOSEU NEGATIVE 10/17/2018 1715   HGBUR NEGATIVE 10/17/2018 [place] 10/17/2018 [place] 10/17/2018 1715   PROTEINUR NEGATIVE 10/17/2018 1715   UROBILINOGEN 1.0 02/19/2015 2354   NITRITE NEGATIVE 10/17/2018 [place] 10/17/2018 1715    Radiological Exams on Admission: Dg Chest Portable 1 View  Result Date: 02/22/2019  CLINICAL DATA:  Fever and cough EXAM: PORTABLE CHEST 1 VIEW COMPARISON:  10/23/2018 FINDINGS: Post sternotomy changes. Left-sided pacing device as before. Small left-sided pleural effusion. Patchy airspace disease at the bases. Enlarged cardiomediastinal silhouette with central vascular congestion. Aortic atherosclerosis. No pneumothorax. IMPRESSION: 1. Patchy basilar airspace disease which may reflect atelectasis or minimal infiltrates 2. Cardiomegaly with slight central congestion. Possible small left effusion Electronically Signed   By: Donavan Foil M.D.   On: 02/22/2019 21:32    EKG: Independently reviewed.  Assessment/Plan Principal Problem:   Community acquired pneumonia Active Problems:   DM2 (diabetes mellitus, type 2) ([place])   HTN (hypertension)   Chronic diastolic congestive heart failure (HCC)   Atrial fibrillation (HCC)   CKD (chronic kidney disease), stage III ([place])   Parkinson's disease ([place])   Acute encephalopathy    1. CAP - will treat bibasilar infiltrates as CAP for the moment 1. PNA pathway 2. Empiric rocephin / azithromycin 3. COVID test pending 1. Airborne and contact precautions until this results 4. Influenza PCR 5. BCx pending 6. Repeat CBC/BMP in AM 2. Chronic diastolic CHF -  1. Doubt that this is in exacerbation, no edema, patient has WBC and fever, etc 2. Will check BNP to compare to prior values 3. And get pro-calcitonin 4. Holding lasix due to decreased PO intake 5. Watch for fluid overload 3. CKD stage 3-4 1. About baseline, maybe slightly increased creat 2. Holding lasix 3. Got 500cc bolus in ED, will hold off on further fluids for now due to risk for exacerbating CHF 4. Intake and output 4. Acute encephalopathy (mild) - 1. Likely mild delirium on top of chronic parkinson's dementia 5. Parkinson's disease - 1. Continue sinemet once med rec completed 6. DM2 - 1. Holding PO hypoglycemics 2. Mod scale SSI AC/HS 7. HTN - 1. Continue metoprolol  when med rec completed 8. A.Fib - 1. Continue metoprolol and eliquis when med rec completed.  DVT prophylaxis: Eliquis Code Status: Full Family Communication: No family in room Disposition Plan: ALF vs SNF after admit Consults called: None Admission status: Admit to inpatient  Severity of Illness: The appropriate patient status for this patient is INPATIENT. Inpatient status is judged to be reasonable and necessary  in order to provide the required intensity of service to ensure the patient's safety. The patient's presenting symptoms, physical exam findings, and initial radiographic and laboratory data in the context of their chronic comorbidities is felt to place them at high risk for further clinical deterioration. Furthermore, it is not anticipated that the patient will be medically stable for discharge from the hospital within 2 midnights of admission. The following factors support the patient status of inpatient.   IP status due to acute encephalopathy secondary to PNA.   * I certify that at the point of admission it is my clinical judgment that the patient will require inpatient hospital care spanning beyond 2 midnights from the point of admission due to high intensity of service, high risk for further deterioration and high frequency of surveillance required.*    ,  M. DO [facility]  How to contact the [facility] Attending or Consulting provider [place] or covering provider during after hours [place], for this patient?  1. Check the care team in [facility] and look for a) attending/consulting [facility] provider listed and b) the [facility] team listed 2. Log into [email or web address]  Amion Physician Scheduling and messaging for groups and whole hospitals  On call and physician scheduling software for group practices, residents, hospitalists and other medical providers for call, clinic, rotation and shift schedules. OnCall Enterprise is a hospital-wide system for scheduling doctors and paging  doctors on call. EasyPlot is for scientific plotting and data analysis.  [email or web address]  and use [place]'s universal password to access. If you do not have the password, please contact the hospital operator.  3. Locate the [facility] provider you are looking for under [facility] and page to a number that you can be directly reached. 4. If you still have difficulty reaching the provider, please page the [facility] (Director on Call) for the Hospitalists listed on amion for assistance.  02/22/2019, 11:40 PM

## 2019-02-23 ENCOUNTER — Inpatient Hospital Stay (HOSPITAL_COMMUNITY): Payer: Medicare HMO

## 2019-02-23 DIAGNOSIS — J189 Pneumonia, unspecified organism: Secondary | ICD-10-CM | POA: Diagnosis present

## 2019-02-23 LAB — CBC
HCT: 29.4 % — ABNORMAL LOW (ref 39.0–52.0)
Hemoglobin: 9.5 g/dL — ABNORMAL LOW (ref 13.0–17.0)
MCH: 35.4 pg — ABNORMAL HIGH (ref 26.0–34.0)
MCHC: 32.3 g/dL (ref 30.0–36.0)
MCV: 109.7 fL — ABNORMAL HIGH (ref 80.0–100.0)
Platelets: 131 10*3/uL — ABNORMAL LOW (ref 150–400)
RBC: 2.68 MIL/uL — ABNORMAL LOW (ref 4.22–5.81)
RDW: 16.5 % — ABNORMAL HIGH (ref 11.5–15.5)
WBC: 12.2 10*3/uL — ABNORMAL HIGH (ref 4.0–10.5)
nRBC: 0 % (ref 0.0–0.2)

## 2019-02-23 LAB — BASIC METABOLIC PANEL WITH GFR
Anion gap: 11 (ref 5–15)
BUN: 45 mg/dL — ABNORMAL HIGH (ref 8–23)
CO2: 23 mmol/L (ref 22–32)
Calcium: 8.2 mg/dL — ABNORMAL LOW (ref 8.9–10.3)
Chloride: 104 mmol/L (ref 98–111)
Creatinine, Ser: 1.94 mg/dL — ABNORMAL HIGH (ref 0.61–1.24)
GFR calc Af Amer: 33 mL/min — ABNORMAL LOW
GFR calc non Af Amer: 29 mL/min — ABNORMAL LOW
Glucose, Bld: 196 mg/dL — ABNORMAL HIGH (ref 70–99)
Potassium: 4.3 mmol/L (ref 3.5–5.1)
Sodium: 138 mmol/L (ref 135–145)

## 2019-02-23 LAB — URINALYSIS, ROUTINE W REFLEX MICROSCOPIC
Bacteria, UA: NONE SEEN
Bilirubin Urine: NEGATIVE
Glucose, UA: 50 mg/dL — AB
Hgb urine dipstick: NEGATIVE
Ketones, ur: NEGATIVE mg/dL
Leukocytes,Ua: NEGATIVE
Nitrite: NEGATIVE
Protein, ur: 30 mg/dL — AB
Specific Gravity, Urine: 1.015 (ref 1.005–1.030)
pH: 5 (ref 5.0–8.0)

## 2019-02-23 LAB — SARS CORONAVIRUS 2 (TAT 6-24 HRS): SARS Coronavirus 2: NEGATIVE

## 2019-02-23 LAB — PROCALCITONIN: Procalcitonin: <0.1 ng/mL

## 2019-02-23 LAB — CBG MONITORING, ED
Glucose-Capillary: 177 mg/dL — ABNORMAL HIGH (ref 70–99)
Glucose-Capillary: 156 mg/dL — ABNORMAL HIGH (ref 70–99)
Glucose-Capillary: 118 mg/dL — ABNORMAL HIGH (ref 70–99)

## 2019-02-23 LAB — PROTIME-INR
INR: 1.4 — ABNORMAL HIGH (ref 0.8–1.2)
Prothrombin Time: 16.5 seconds — ABNORMAL HIGH (ref 11.4–15.2)

## 2019-02-23 LAB — PATHOLOGIST SMEAR REVIEW

## 2019-02-23 LAB — HEMOGLOBIN A1C
Hgb A1c MFr Bld: 7.4 % — ABNORMAL HIGH (ref 4.8–5.6)
Mean Plasma Glucose: 165.68 mg/dL

## 2019-02-23 LAB — APTT: aPTT: 31 seconds (ref 24–36)

## 2019-02-23 LAB — GLUCOSE, CAPILLARY: Glucose-Capillary: 145 mg/dL — ABNORMAL HIGH (ref 70–99)

## 2019-02-23 LAB — INFLUENZA PANEL BY PCR (TYPE A & B)
Influenza A By PCR: NEGATIVE
Influenza B By PCR: NEGATIVE

## 2019-02-23 LAB — LACTIC ACID, PLASMA: Lactic Acid, Venous: 1.6 mmol/L (ref 0.5–1.9)

## 2019-02-23 LAB — BRAIN NATRIURETIC PEPTIDE: B Natriuretic Peptide: 577.5 pg/mL — ABNORMAL HIGH (ref 0.0–100.0)

## 2019-02-23 LAB — HIV ANTIBODY (ROUTINE TESTING W REFLEX): HIV Screen 4th Generation wRfx: NONREACTIVE

## 2019-02-23 MED ORDER — METOPROLOL SUCCINATE ER 50 MG PO TB24
50.0000 mg | ORAL_TABLET | Freq: Every day | ORAL | Status: DC
  Administered 2019-02-23 – 2019-02-28 (×6): 50 mg via ORAL
  Filled 2019-02-23 (×4): qty 1
  Filled 2019-02-23: qty 2
  Filled 2019-02-23 (×2): qty 1

## 2019-02-23 MED ORDER — METOPROLOL TARTRATE 100 MG PO TABS
100.0000 mg | ORAL_TABLET | Freq: Every day | ORAL | Status: DC
  Administered 2019-02-23 – 2019-03-01 (×7): 100 mg via ORAL
  Filled 2019-02-23 (×4): qty 1
  Filled 2019-02-23: qty 4
  Filled 2019-02-23 (×2): qty 1

## 2019-02-23 MED ORDER — HALOPERIDOL LACTATE 5 MG/ML IJ SOLN
1.0000 mg | Freq: Once | INTRAMUSCULAR | Status: AC
  Administered 2019-02-23: 1 mg via INTRAVENOUS
  Filled 2019-02-23: qty 1

## 2019-02-23 NOTE — ED Notes (Signed)
Urine and urine culture collected after abts initiated; Dr. Alcario Drought informed and requested they still be collected and sent.

## 2019-02-23 NOTE — Progress Notes (Signed)
PROGRESS NOTE    [name] Powell  HMC:[id number] DOB: [birth year] DOA: 02/22/2019 PCP: [name], MD   Brief Narrative:  [name] Powell is a 85 y.o. male with medical history significant of HTN, DM2, A.Fib on eliquis, CHB s/p PPM, parkinson's. Patient presents to ED with fever, generalized weakness, decreased appetite, and cough. Lives in ALF with his wife.  Wife reports 2 episodes of coughing up pink-tinged sputum.  Temp 100.9 with EMS, got 1g tylenol. Patient's wife states that he has not been acting himself and being more quiet than normal, although with his Parkinson's and dementia, he doesn't say too much at baseline.Pt cant provide much history.   Assessment & Plan:   Principal Problem:   Community acquired pneumonia Active Problems:   DM2 (diabetes mellitus, type 2) ([place])   HTN (hypertension)   Chronic diastolic congestive heart failure (HCC)   Atrial fibrillation (HCC)   CKD (chronic kidney disease), stage III ([place])   Parkinson's disease ([place])   Acute encephalopathy   CAP (community acquired pneumonia)   1. CAP - will treat bibasilar infiltrates as CAP for the moment 1. PNA pathway 2. Empiric rocephin / azithromycin 3. COVID negative 4. Influenza PCR neg 5. BCx remain pending 6. Repeat CBC/BMP in AM 2. Chronic diastolic CHF -  1. CXR questionable-repeat in AM 2. BNP is elevated but clinically inconsistent 3. Held lasix on admission, will add back in 4. Watch for fluid overload 5. Monitor lytes/renal fx 3. CKD stage 3-4 1. About baseline, maybe slightly increased creat 2. Got 500cc bolus in ED, will hold off on further fluids for now due to risk for exacerbating CHF 3. Intake and output 4. Acute encephalopathy (mild) - 1. Likely mild delirium on top of chronic parkinson's dementia 5. Parkinson's disease - 1. Continue sinemet once med rec completed 6. DM2 - 1. Holding PO hypoglycemics 2. Mod scale SSI AC/HS 7. HTN - 1. Continue metoprolol when med  rec completed 8. A.Fib - 1. Continue metoprolol and eliquis when med rec completed.  DVT prophylaxis: MO:[id number]  Code Status: Full    Code Status Orders  (From admission, onward)         Start     Ordered   02/22/19 2338  Full code  Continuous     02/22/19 2339        Code Status History    Date Active Date Inactive Code Status Order ID Comments User Context   10/23/2018 1914 10/25/2018 1601 Full Code [id number]  Lenore Cordia, MD ED   02/02/2017 1456 02/02/2017 2001 Full Code [id number]  [name], MD Inpatient   07/27/2016 0439 07/29/2016 1800 Full Code [id number]  Rise Patience, MD Inpatient   09/15/2015 1652 09/18/2015 2225 Full Code [id number]  Radene Gunning, NP ED   02/19/2015 2325 02/21/2015 1737 Full Code [id number]  Danford, Suann Larry, MD Inpatient   Advance Care Planning Activity    Advance Directive Documentation     Most Recent Value  Type of Advance Directive  Living will  Pre-existing out of facility DNR order (yellow form or pink MOST form)  -  "MOST" Form in Place?  -     Family Communication: called wife discussed plan of care including patient's confusion having a sitter and Haldol to help calm him down Disposition Plan:   Patient remained inpatient for continued IV antibiotics respiratory support.  Patient is medically stable for discharge. Consults called: None Admission status: Inpatient   Consultants:   None  Procedures:  Dg Chest Portable 1 View  Result Date: 02/22/2019 CLINICAL DATA:  Fever and cough EXAM: PORTABLE CHEST 1 VIEW COMPARISON:  10/23/2018 FINDINGS: Post sternotomy changes. Left-sided pacing device as before. Small left-sided pleural effusion. Patchy airspace disease at the bases. Enlarged cardiomediastinal silhouette with central vascular congestion. Aortic atherosclerosis. No pneumothorax. IMPRESSION: 1. Patchy basilar airspace disease which may reflect atelectasis or minimal infiltrates 2. Cardiomegaly with slight central  congestion. Possible small left effusion Electronically Signed   By: Donavan Foil M.D.   On: 02/22/2019 21:32     Antimicrobials:   Ceftriaxone and azithromycin >10/15   Subjective: Patient was confused overnight Being out of bed nonadherent to staff direction Patient received Haldol x1 and is more calm and redirectable  Objective: Vitals:   02/23/19 1023 02/23/19 1100 02/23/19 1200 02/23/19 1300  BP: (!) 157/68 (!) 151/96 129/62 131/63  Pulse: 61 60 (!) 59 (!) 59  Resp: 18     Temp:      TempSrc:      SpO2: 98% 100% 100% 100%  Weight:      Height:        Intake/Output Summary (Last 24 hours) at 02/23/2019 1346 Last data filed at 02/23/2019 0521 Gross per 24 hour  Intake 2564.45 ml  Output 2 ml  Net 2562.45 ml   Filed Weights   02/22/19 2039  Weight: 81.6 kg    Examination:  General exam: Calm on my evaluation, Respiratory system: Mild rales bilaterally, mild rhonchi partially cleared by coughing will still present Cardiovascular system: S1 & S2 heard, RRR. No JVD, murmurs, rubs, gallops or clicks. No pedal edema. Gastrointestinal system: Abdomen is nondistended, soft and nontender. No organomegaly or masses felt. Normal bowel sounds heard. Central nervous system: Alert, mild confusion. No focal neurological deficits. Extremities: Moves all 4 extremities freely, warm well perfused no contractures. Skin: No rashes, lesions or ulcers Psychiatry: Judgement and insight impaired. Mood & affect flat    Data Reviewed: I have personally reviewed following labs and imaging studies  CBC: Recent Labs  Lab 02/22/19 2046 02/23/19 0106  WBC 12.2* 12.2*  NEUTROABS 7.6  --   HGB 9.1* 9.5*  HCT 29.1* 29.4*  MCV 110.2* 109.7*  PLT 136* 426*   Basic Metabolic Panel: Recent Labs  Lab 02/22/19 2046 02/23/19 0106  NA 137 138  K 4.4 4.3  CL 103 104  CO2 23 23  GLUCOSE 259* 196*  BUN 46* 45*  CREATININE 2.07* 1.94*  CALCIUM 8.5* 8.2*   GFR: Estimated  Creatinine Clearance: 22.5 mL/min (A) (by C-G formula based on SCr of 1.94 mg/dL (H)). Liver Function Tests: Recent Labs  Lab 02/22/19 2046  AST 14*  ALT 7  ALKPHOS 102  BILITOT 0.8  PROT 6.7  ALBUMIN 3.1*   No results for input(s): LIPASE, AMYLASE in the last 168 hours. No results for input(s): AMMONIA in the last 168 hours. Coagulation Profile: Recent Labs  Lab 02/22/19 2115  INR 1.4*   Cardiac Enzymes: No results for input(s): CKTOTAL, CKMB, CKMBINDEX, TROPONINI in the last 168 hours. BNP (last 3 results) No results for input(s): PROBNP in the last 8760 hours. HbA1C: Recent Labs    02/22/19 2046  HGBA1C 7.4*   CBG: Recent Labs  Lab 02/23/19 0106 02/23/19 0817 02/23/19 1326  GLUCAP 177* 156* 118*   Lipid Profile: No results for input(s): CHOL, HDL, LDLCALC, TRIG, CHOLHDL, LDLDIRECT in the last 72 hours. Thyroid Function Tests: No results for input(s): TSH, T4TOTAL, FREET4, T3FREE,  THYROIDAB in the last 72 hours. Anemia Panel: No results for input(s): VITAMINB12, FOLATE, FERRITIN, TIBC, IRON, RETICCTPCT in the last 72 hours. Sepsis Labs: Recent Labs  Lab 02/22/19 0117 02/22/19 2045 02/22/19 2046  PROCALCITON  --   --  <0.10  LATICACIDVEN 1.6 2.2*  --     Recent Results (from the past 240 hour(s))  SARS CORONAVIRUS 2 (TAT 6-24 HRS) Nasopharyngeal Nasopharyngeal Swab     Status: None   Collection Time: 02/22/19  8:46 PM   Specimen: Nasopharyngeal Swab  Result Value Ref Range Status   SARS Coronavirus 2 NEGATIVE NEGATIVE Final    Comment: (NOTE) SARS-CoV-2 target nucleic acids are NOT DETECTED. The SARS-CoV-2 RNA is generally detectable in upper and lower respiratory specimens during the acute phase of infection. Negative results do not preclude SARS-CoV-2 infection, do not rule out co-infections with other pathogens, and should not be used as the sole basis for treatment or other patient management decisions. Negative results must be combined with  clinical observations, patient history, and epidemiological information. The expected result is Negative. Fact Sheet for Patients: SugarRoll.be Fact Sheet for Healthcare Providers: https://www.woods-mathews.com/ This test is not yet approved or cleared by the [place] FDA and  has been authorized for detection and/or diagnosis of SARS-CoV-2 by FDA under an Emergency Use Authorization (EUA). This EUA will remain  in effect (meaning this test can be used) for the duration of the COVID-19 declaration under Section 56 4(b)(1) of the Act, 21 U.S.C. section 360bbb-3(b)(1), unless the authorization is terminated or revoked sooner. Performed at [facility], [place] [address], [place], [place] [address]   Culture, blood (routine x 2)     Status: None (Preliminary result)   Collection Time: 02/22/19  9:02 PM   Specimen: BLOOD RIGHT FOREARM  Result Value Ref Range Status   Specimen Description BLOOD RIGHT FOREARM  Final   Special Requests   Final    BOTTLES DRAWN AEROBIC AND ANAEROBIC Blood Culture adequate volume   Culture   Final    NO GROWTH < 12 HOURS Performed at [facility], [place] [address] [place], [place] [address]    Report Status PENDING  Incomplete  Culture, blood (routine x 2)     Status: None (Preliminary result)   Collection Time: 02/22/19 10:30 PM   Specimen: BLOOD LEFT WRIST  Result Value Ref Range Status   Specimen Description BLOOD LEFT WRIST  Final   Special Requests   Final    BOTTLES DRAWN AEROBIC AND ANAEROBIC Blood Culture adequate volume Performed at [facility], [place] [address] [place], [place] [address]    Culture PENDING  Incomplete   Report Status PENDING  Incomplete         Radiology Studies: Dg Chest Portable 1 View  Result Date: 02/22/2019 CLINICAL DATA:  Fever and cough EXAM: PORTABLE CHEST 1 VIEW COMPARISON:  10/23/2018 FINDINGS: Post sternotomy changes. Left-sided pacing device as  before. Small left-sided pleural effusion. Patchy airspace disease at the bases. Enlarged cardiomediastinal silhouette with central vascular congestion. Aortic atherosclerosis. No pneumothorax. IMPRESSION: 1. Patchy basilar airspace disease which may reflect atelectasis or minimal infiltrates 2. Cardiomegaly with slight central congestion. Possible small left effusion Electronically Signed   By: Donavan Foil M.D.   On: 02/22/2019 21:32        Scheduled Meds: . apixaban  2.5 mg Oral BID  . carbidopa-levodopa  1 tablet Oral TID  . insulin aspart  0-15 Units Subcutaneous TID WC  .  insulin aspart  0-5 Units Subcutaneous QHS  . metoprolol succinate  50 mg Oral QHS  . metoprolol tartrate  100 mg Oral Daily   Continuous Infusions: . azithromycin Stopped (02/22/19 2357)  . cefTRIAXone (ROCEPHIN)  IV Stopped (02/22/19 2307)     LOS: 1 day    Time spent: 69 min    Nicolette Bang, MD [facility]  If 7PM-7AM, please contact night-coverage  02/23/2019, 1:46 PM

## 2019-02-23 NOTE — ED Notes (Addendum)
Patient persistently attempting to get out of bed, pulling off BP cuff, pulse ox, and leads and scooting self toward end of bed. Assisted patient with urinal and repositioned in bed. Call light in reach and explained to patient to call if he needs anything. No safety sitter present at this time. Charge RN aware.

## 2019-02-23 NOTE — ED Notes (Signed)
Safety sitter with patient now d/t increased agitation and attempts to get out of bed, no longer redirectable. Verbal order received for Haldol - see MAR and patient now appears to be more calm. Staff able to get him back in bed, linens changed, and hooked back up to cardiac monitor.

## 2019-02-23 NOTE — Progress Notes (Signed)
New Admission Note: ? Arrival Method: via stretcher Mental Orientation: A/O x 2, person and place/ Hx of dementia Telemetry: Box # 19 Assessment: Completed Skin: Refer to flowsheet IV: Right Forearm Pain: none Tubes: Safety Measures: Safety Fall Prevention Plan discussed with patient. Safety sitter at bedside. Admission:  [place] Orientation: Patient has been orientated to the room, unit and the staff. Family: Orders have been reviewed and are being implemented. Will continue to monitor the patient. Call light has been placed within reach and bed alarm has been activated.  ? [employer], [place]

## 2019-02-24 DIAGNOSIS — J189 Pneumonia, unspecified organism: Secondary | ICD-10-CM | POA: Diagnosis not present

## 2019-02-24 LAB — CBC WITH DIFFERENTIAL/PLATELET
Abs Immature Granulocytes: 0.08 10*3/uL — ABNORMAL HIGH (ref 0.00–0.07)
Basophils Absolute: 0 10*3/uL (ref 0.0–0.1)
Basophils Relative: 0 %
Eosinophils Absolute: 0 10*3/uL (ref 0.0–0.5)
Eosinophils Relative: 0 %
HCT: 27.4 % — ABNORMAL LOW (ref 39.0–52.0)
Hemoglobin: 8.5 g/dL — ABNORMAL LOW (ref 13.0–17.0)
Immature Granulocytes: 1 %
Lymphocytes Relative: 8 %
Lymphs Abs: 0.7 10*3/uL (ref 0.7–4.0)
MCH: 34 pg (ref 26.0–34.0)
MCHC: 31 g/dL (ref 30.0–36.0)
MCV: 109.6 fL — ABNORMAL HIGH (ref 80.0–100.0)
Monocytes Absolute: 3.3 10*3/uL — ABNORMAL HIGH (ref 0.1–1.0)
Monocytes Relative: 34 %
Neutro Abs: 5.6 10*3/uL (ref 1.7–7.7)
Neutrophils Relative %: 57 %
Platelets: 131 10*3/uL — ABNORMAL LOW (ref 150–400)
RBC: 2.5 MIL/uL — ABNORMAL LOW (ref 4.22–5.81)
RDW: 16.1 % — ABNORMAL HIGH (ref 11.5–15.5)
WBC: 9.8 10*3/uL (ref 4.0–10.5)
nRBC: 0 % (ref 0.0–0.2)

## 2019-02-24 LAB — BASIC METABOLIC PANEL
Anion gap: 8 (ref 5–15)
BUN: 37 mg/dL — ABNORMAL HIGH (ref 8–23)
CO2: 25 mmol/L (ref 22–32)
Calcium: 8.5 mg/dL — ABNORMAL LOW (ref 8.9–10.3)
Chloride: 104 mmol/L (ref 98–111)
Creatinine, Ser: 1.75 mg/dL — ABNORMAL HIGH (ref 0.61–1.24)
GFR calc Af Amer: 38 mL/min — ABNORMAL LOW (ref >60)
GFR calc non Af Amer: 33 mL/min — ABNORMAL LOW (ref >60)
Glucose, Bld: 175 mg/dL — ABNORMAL HIGH (ref 70–99)
Potassium: 4.4 mmol/L (ref 3.5–5.1)
Sodium: 137 mmol/L (ref 135–145)

## 2019-02-24 LAB — GLUCOSE, CAPILLARY
Glucose-Capillary: 143 mg/dL — ABNORMAL HIGH (ref 70–99)
Glucose-Capillary: 175 mg/dL — ABNORMAL HIGH (ref 70–99)
Glucose-Capillary: 95 mg/dL (ref 70–99)
Glucose-Capillary: 125 mg/dL — ABNORMAL HIGH (ref 70–99)

## 2019-02-24 LAB — URINE CULTURE: Culture: <10000 — AB

## 2019-02-24 MED ORDER — FUROSEMIDE 40 MG PO TABS
40.0000 mg | ORAL_TABLET | Freq: Every day | ORAL | Status: DC
  Administered 2019-02-24 – 2019-02-25 (×2): 40 mg via ORAL
  Filled 2019-02-24 (×2): qty 1

## 2019-02-24 NOTE — Plan of Care (Signed)
°  Problem: Coping: °Goal: Level of anxiety will decrease °Outcome: Progressing °  °

## 2019-02-24 NOTE — Progress Notes (Signed)
PROGRESS NOTE    [name] Powell  ZLD:[id number] DOB: Aug 03, 1922 DOA: 02/22/2019 PCP: [name], MD   Brief Narrative:  [name] Chamber Burneyis a 85 y.o.malewith medical history significant ofHTN, DM2, A.Fib on eliquis, CHB s/p PPM, parkinson's. Patient presents to ED with fever, generalized weakness, decreased appetite, and cough. Lives in ALF with his wife. Wife reports 2 episodes of coughing up pink-tinged sputum. Temp 100.9 with EMS, got 1g tylenol. Patient's wife states that he has not been acting himself and being more quiet than normal, although with his Parkinson's and dementia,he doesn't say too much at baseline.Pt cant provide much history.   Assessment & Plan:   Principal Problem:   Community acquired pneumonia Active Problems:   DM2 (diabetes mellitus, type 2) ([name] Powell)   HTN (hypertension)   Chronic diastolic congestive heart failure (HCC)   Atrial fibrillation (HCC)   CKD (chronic kidney disease), stage III ([name] Powell)   Parkinson's disease ([name] Powell)   Acute encephalopathy   CAP (community acquired pneumonia)   1. CAP - will treat bibasilar infiltrates as CAP for the moment 1. PNA pathway 2. Empiric rocephin / azithromycin 3. COVID negative 4. Influenza PCR neg 5. BCx remain pending 6. Monitor CBC/BMP in AM, wbc now nl 2. Chronic diastolic CHF -  1. CXR questionable-repeat in AM 2. BNP is elevated but clinically inconsistent 3. Held lasix on admission, added back in 10/16 4. Watch for fluid overload-shows 2.9 net positive-but no output recorded 5. Added strict I/o 6. Monitor lytes/renal fx 3. CKD stage 3-4 1. About baseline, maybe slightly increased creat 2. Got 500cc bolus in ED,  3. As above, Added strict Intake and output 4. Acute encephalopathy (mild) - 5. Likely mild delirium on top of chronic parkinson's dementia       1 received haldol x 1, more stable,  6. Parkinson's disease - 1. Continue sinemet once med rec completed 7. DM2 - 1. Holding PO  hypoglycemics 2. Mod scale SSI AC/HS 8. HTN - 1. Continue metoprolol when med rec completed 9. A.Fib - 1. Continue metoprolol and eliquis when med rec completed.  DVT prophylaxis: JQ:[id number]  Code Status: Full    Code Status Orders  (From admission, onward)         Start     Ordered   02/22/19 2338  Full code  Continuous     02/22/19 2339        Code Status History    Date Active Date Inactive Code Status Order ID Comments User Context   10/23/2018 1914 10/25/2018 1601 Full Code [id number]  Lenore Cordia, MD ED   02/02/2017 1456 02/02/2017 2001 Full Code [id number]  [name], MD Inpatient   07/27/2016 0439 07/29/2016 1800 Full Code [id number]  Rise Patience, MD Inpatient   09/15/2015 1652 09/18/2015 2225 Full Code [id number]  Radene Gunning, NP ED   02/19/2015 2325 02/21/2015 1737 Full Code [id number]  Danford, Suann Larry, MD Inpatient   Advance Care Planning Activity    Advance Directive Documentation     Most Recent Value  Type of Advance Directive  Living will  Pre-existing out of facility DNR order (yellow form or pink MOST form)  --  "MOST" Form in Place?  --     Family Communication: none today, discussed with wife in detail yesterday evening Disposition Plan:   Patient remained inpatient continued IV antibiotics, respiratory support.  Patient is not medically stable for discharge Consults called: None Admission status: Inpatient   Consultants:  None  Procedures:  Dg Chest Port 1 View  Result Date: 02/23/2019 CLINICAL DATA:  Fever and weakness EXAM: PORTABLE CHEST 1 VIEW COMPARISON:  February 22, 2019 FINDINGS: There is persistent airspace consolidation in the left lower lobe with small left pleural effusion. There is mild right base atelectasis. Heart is mildly enlarged with pulmonary vascularity within normal limits. Pacemaker leads are attached to the right atrium and right ventricle. Patient is status post coronary artery bypass grafting. There is  aortic atherosclerosis. No adenopathy. No bone lesions. IMPRESSION: 1. Persistent left lower lobe pneumonia with small left pleural effusion. 2. Mild right base atelectasis. 3.  Aortic Atherosclerosis (ICD10-I70.0). 4. Stable cardiac prominence. Status post coronary artery bypass grafting. Pacemaker leads attached to right atrium and right ventricle. Electronically Signed   By: Lowella Grip III M.D.   On: 02/23/2019 14:20   Dg Chest Portable 1 View  Result Date: 02/22/2019 CLINICAL DATA:  Fever and cough EXAM: PORTABLE CHEST 1 VIEW COMPARISON:  10/23/2018 FINDINGS: Post sternotomy changes. Left-sided pacing device as before. Small left-sided pleural effusion. Patchy airspace disease at the bases. Enlarged cardiomediastinal silhouette with central vascular congestion. Aortic atherosclerosis. No pneumothorax. IMPRESSION: 1. Patchy basilar airspace disease which may reflect atelectasis or minimal infiltrates 2. Cardiomegaly with slight central congestion. Possible small left effusion Electronically Signed   By: Donavan Foil M.D.   On: 02/22/2019 21:32     Antimicrobials:   Ceftriaxone and azithromycin >10/15   Subjective: Patient is less confused less anxious Resting in bed comfortably As noted while waiting in the ER and holding he did receive Haldol x1  Objective: Vitals:   02/23/19 2303 02/23/19 2315 02/24/19 0303 02/24/19 0900  BP: (!) 173/67  (!) 152/47 (!) 168/71  Pulse: (!) 59 61 60 60  Resp: 16  16 18   Temp: 98 F (36.7 C)  98.7 F (37.1 C) 98.2 F (36.8 C)  TempSrc: Oral  Oral Oral  SpO2: 99% 98% 94% 97%  Weight:      Height:        Intake/Output Summary (Last 24 hours) at 02/24/2019 1610 Last data filed at 02/24/2019 0600 Gross per 24 hour  Intake 343.69 ml  Output 1 ml  Net 342.69 ml   Filed Weights   02/22/19 2039  Weight: 81.6 kg    Examination:  General exam: Appears calm and comfortable, was able to answer questions appropriately this  morning Respiratory system: Mild rales bilaterally, trace rhonchi no accessory muscle use Cardiovascular system: S1 & S2 heard, RRR. No JVD, murmurs, rubs, gallops or clicks. No pedal edema. Gastrointestinal system: Abdomen is nondistended, soft and nontender. No organomegaly or masses felt. Normal bowel sounds heard. Central nervous system: Alertm No focal neurological deficits. Extremities: Moving all 4 extremities freely, warm well perfused Skin: No rashes, lesions or ulcers Psychiatry: Judgement and insight impaired with known chronic dementia. Mood & affect flat    Data Reviewed: I have personally reviewed following labs and imaging studies  CBC: Recent Labs  Lab 02/22/19 2046 02/23/19 0106 02/24/19 0437  WBC 12.2* 12.2* 9.8  NEUTROABS 7.6  --  5.6  HGB 9.1* 9.5* 8.5*  HCT 29.1* 29.4* 27.4*  MCV 110.2* 109.7* 109.6*  PLT 136* 131* 960*   Basic Metabolic Panel: Recent Labs  Lab 02/22/19 2046 02/23/19 0106 02/24/19 0437  NA 137 138 137  K 4.4 4.3 4.4  CL 103 104 104  CO2 23 23 25   GLUCOSE 259* 196* 175*  BUN 46* 45* 37*  CREATININE 2.07* 1.94* 1.75*  CALCIUM 8.5* 8.2* 8.5*   GFR: Estimated Creatinine Clearance: 25 mL/min (A) (by C-G formula based on SCr of 1.75 mg/dL (H)). Liver Function Tests: Recent Labs  Lab 02/22/19 2046  AST 14*  ALT 7  ALKPHOS 102  BILITOT 0.8  PROT 6.7  ALBUMIN 3.1*   No results for input(s): LIPASE, AMYLASE in the last 168 hours. No results for input(s): AMMONIA in the last 168 hours. Coagulation Profile: Recent Labs  Lab 02/22/19 2115  INR 1.4*   Cardiac Enzymes: No results for input(s): CKTOTAL, CKMB, CKMBINDEX, TROPONINI in the last 168 hours. BNP (last 3 results) No results for input(s): PROBNP in the last 8760 hours. HbA1C: Recent Labs    02/22/19 2046  HGBA1C 7.4*   CBG: Recent Labs  Lab 02/23/19 0106 02/23/19 0817 02/23/19 1326 02/23/19 2300 02/24/19 0649  GLUCAP 177* 156* 118* 145* 143*   Lipid  Profile: No results for input(s): CHOL, HDL, LDLCALC, TRIG, CHOLHDL, LDLDIRECT in the last 72 hours. Thyroid Function Tests: No results for input(s): TSH, T4TOTAL, FREET4, T3FREE, THYROIDAB in the last 72 hours. Anemia Panel: No results for input(s): VITAMINB12, FOLATE, FERRITIN, TIBC, IRON, RETICCTPCT in the last 72 hours. Sepsis Labs: Recent Labs  Lab 02/22/19 0117 02/22/19 2045 02/22/19 2046  PROCALCITON  --   --  <0.10  LATICACIDVEN 1.6 2.2*  --     Recent Results (from the past 240 hour(s))  Urine culture     Status: Abnormal   Collection Time: 02/22/19  1:52 AM   Specimen: In/Out Cath Urine  Result Value Ref Range Status   Specimen Description IN/OUT CATH URINE  Final   Special Requests NONE  Final   Culture (A)  Final    <10,000 COLONIES/mL INSIGNIFICANT GROWTH Performed at [facility], [address] [place], [place] [address]    Report Status 02/24/2019 FINAL  Final  SARS CORONAVIRUS 2 (TAT 6-24 HRS) Nasopharyngeal Nasopharyngeal Swab     Status: None   Collection Time: 02/22/19  8:46 PM   Specimen: Nasopharyngeal Swab  Result Value Ref Range Status   SARS Coronavirus 2 NEGATIVE NEGATIVE Final    Comment: (NOTE) SARS-CoV-2 target nucleic acids are NOT DETECTED. The SARS-CoV-2 RNA is generally detectable in upper and lower respiratory specimens during the acute phase of infection. Negative results do not preclude SARS-CoV-2 infection, do not rule out co-infections with other pathogens, and should not be used as the sole basis for treatment or other patient management decisions. Negative results must be combined with clinical observations, patient history, and epidemiological information. The expected result is Negative. Fact Sheet for Patients: SugarRoll.be Fact Sheet for Healthcare Providers: https://www.woods-mathews.com/ This test is not yet approved or cleared by the [place] FDA and  has been authorized  for detection and/or diagnosis of SARS-CoV-2 by FDA under an Emergency Use Authorization (EUA). This EUA will remain  in effect (meaning this test can be used) for the duration of the COVID-19 declaration under Section 56 4(b)(1) of the Act, 21 U.S.C. section 360bbb-3(b)(1), unless the authorization is terminated or revoked sooner. Performed at [facility], [place] [address], [place], [place] [address]   Culture, blood (routine x 2)     Status: None (Preliminary result)   Collection Time: 02/22/19  9:02 PM   Specimen: BLOOD RIGHT FOREARM  Result Value Ref Range Status   Specimen Description BLOOD RIGHT FOREARM  Final   Special Requests   Final    BOTTLES  DRAWN AEROBIC AND ANAEROBIC Blood Culture adequate volume   Culture   Final    NO GROWTH 2 DAYS Performed at [facility], [place] [address], [place], [place] [address]    Report Status PENDING  Incomplete  Culture, blood (routine x 2)     Status: None (Preliminary result)   Collection Time: 02/22/19 10:30 PM   Specimen: BLOOD LEFT WRIST  Result Value Ref Range Status   Specimen Description BLOOD LEFT WRIST  Final   Special Requests   Final    BOTTLES DRAWN AEROBIC AND ANAEROBIC Blood Culture adequate volume   Culture   Final    NO GROWTH 1 DAY Performed at [facility], [place] [address], [place], [place] [address]    Report Status PENDING  Incomplete         Radiology Studies: Dg Chest Port 1 View  Result Date: 02/23/2019 CLINICAL DATA:  Fever and weakness EXAM: PORTABLE CHEST 1 VIEW COMPARISON:  February 22, 2019 FINDINGS: There is persistent airspace consolidation in the left lower lobe with small left pleural effusion. There is mild right base atelectasis. Heart is mildly enlarged with pulmonary vascularity within normal limits. Pacemaker leads are attached to the right atrium and right ventricle. Patient is status post coronary artery bypass grafting. There is aortic atherosclerosis. No adenopathy. No bone  lesions. IMPRESSION: 1. Persistent left lower lobe pneumonia with small left pleural effusion. 2. Mild right base atelectasis. 3.  Aortic Atherosclerosis (ICD10-I70.0). 4. Stable cardiac prominence. Status post coronary artery bypass grafting. Pacemaker leads attached to right atrium and right ventricle. Electronically Signed   By: Lowella Grip III M.D.   On: 02/23/2019 14:20   Dg Chest Portable 1 View  Result Date: 02/22/2019 CLINICAL DATA:  Fever and cough EXAM: PORTABLE CHEST 1 VIEW COMPARISON:  10/23/2018 FINDINGS: Post sternotomy changes. Left-sided pacing device as before. Small left-sided pleural effusion. Patchy airspace disease at the bases. Enlarged cardiomediastinal silhouette with central vascular congestion. Aortic atherosclerosis. No pneumothorax. IMPRESSION: 1. Patchy basilar airspace disease which may reflect atelectasis or minimal infiltrates 2. Cardiomegaly with slight central congestion. Possible small left effusion Electronically Signed   By: Donavan Foil M.D.   On: 02/22/2019 21:32        Scheduled Meds:  apixaban  2.5 mg Oral BID   carbidopa-levodopa  1 tablet Oral TID   furosemide  40 mg Oral Daily   insulin aspart  0-15 Units Subcutaneous TID WC   insulin aspart  0-5 Units Subcutaneous QHS   metoprolol succinate  50 mg Oral QHS   metoprolol tartrate  100 mg Oral Daily   Continuous Infusions:  azithromycin 500 mg (02/24/19 0054)   cefTRIAXone (ROCEPHIN)  IV 2 g (02/23/19 2351)     LOS: 2 days    Time spent: 35 min    Nicolette Bang, MD [facility]  If 7PM-7AM, please contact night-coverage  02/24/2019, 9:28 AM

## 2019-02-24 NOTE — Discharge Instructions (Signed)

## 2019-02-25 ENCOUNTER — Inpatient Hospital Stay (HOSPITAL_COMMUNITY): Payer: Medicare HMO

## 2019-02-25 LAB — CBC WITH DIFFERENTIAL/PLATELET
Abs Immature Granulocytes: 0.07 10*3/uL (ref 0.00–0.07)
Basophils Absolute: 0 10*3/uL (ref 0.0–0.1)
Basophils Relative: 0 %
Eosinophils Absolute: 0 10*3/uL (ref 0.0–0.5)
Eosinophils Relative: 0 %
HCT: 24.9 % — ABNORMAL LOW (ref 39.0–52.0)
Hemoglobin: 8.1 g/dL — ABNORMAL LOW (ref 13.0–17.0)
Immature Granulocytes: 1 %
Lymphocytes Relative: 11 %
Lymphs Abs: 0.9 10*3/uL (ref 0.7–4.0)
MCH: 34.8 pg — ABNORMAL HIGH (ref 26.0–34.0)
MCHC: 32.5 g/dL (ref 30.0–36.0)
MCV: 106.9 fL — ABNORMAL HIGH (ref 80.0–100.0)
Monocytes Absolute: 3 10*3/uL — ABNORMAL HIGH (ref 0.1–1.0)
Monocytes Relative: 37 %
Neutro Abs: 4 10*3/uL (ref 1.7–7.7)
Neutrophils Relative %: 51 %
Platelets: 129 10*3/uL — ABNORMAL LOW (ref 150–400)
RBC: 2.33 MIL/uL — ABNORMAL LOW (ref 4.22–5.81)
RDW: 16.1 % — ABNORMAL HIGH (ref 11.5–15.5)
WBC: 8 10*3/uL (ref 4.0–10.5)
nRBC: 0 % (ref 0.0–0.2)

## 2019-02-25 LAB — BASIC METABOLIC PANEL
Anion gap: 10 (ref 5–15)
BUN: 40 mg/dL — ABNORMAL HIGH (ref 8–23)
CO2: 26 mmol/L (ref 22–32)
Calcium: 8.2 mg/dL — ABNORMAL LOW (ref 8.9–10.3)
Chloride: 104 mmol/L (ref 98–111)
Creatinine, Ser: 1.73 mg/dL — ABNORMAL HIGH (ref 0.61–1.24)
GFR calc Af Amer: 38 mL/min — ABNORMAL LOW (ref >60)
GFR calc non Af Amer: 33 mL/min — ABNORMAL LOW (ref >60)
Glucose, Bld: 138 mg/dL — ABNORMAL HIGH (ref 70–99)
Potassium: 3.9 mmol/L (ref 3.5–5.1)
Sodium: 140 mmol/L (ref 135–145)

## 2019-02-25 LAB — GLUCOSE, CAPILLARY
Glucose-Capillary: 115 mg/dL — ABNORMAL HIGH (ref 70–99)
Glucose-Capillary: 181 mg/dL — ABNORMAL HIGH (ref 70–99)
Glucose-Capillary: 106 mg/dL — ABNORMAL HIGH (ref 70–99)
Glucose-Capillary: 141 mg/dL — ABNORMAL HIGH (ref 70–99)

## 2019-02-25 MED ORDER — FUROSEMIDE 10 MG/ML IJ SOLN
40.0000 mg | Freq: Two times a day (BID) | INTRAMUSCULAR | Status: DC
  Administered 2019-02-25 – 2019-02-27 (×5): 40 mg via INTRAVENOUS
  Filled 2019-02-25 (×5): qty 4

## 2019-02-25 NOTE — Progress Notes (Signed)
PROGRESS NOTE    [name] Powell  LDJ:[id number] DOB: [birth year] DOA: 02/22/2019 PCP: [name], MD   Brief Narrative:  Malachy Chamber Burneyis a 85 y.o.malewith medical history significant ofHTN, DM2, A.Fib on eliquis, CHB s/p PPM, parkinson's. Patient presents to ED with fever, generalized weakness, decreased appetite, and cough. Lives in ALF with his wife. Wife reports 2 episodes of coughing up pink-tinged sputum. Temp 100.9 with EMS, got 1g tylenol. Patient's wife states that he has not been acting himself and being more quiet than normal, although with his Parkinson's and dementia,he doesn't say too much at baseline.Pt cant provide much history   Assessment & Plan:   Principal Problem:   Community acquired pneumonia Active Problems:   DM2 (diabetes mellitus, type 2) ([place])   HTN (hypertension)   Chronic diastolic congestive heart failure (HCC)   Atrial fibrillation (HCC)   CKD (chronic kidney disease), stage III ([place])   Parkinson's disease ([place])   Acute encephalopathy   CAP (community acquired pneumonia)   1. CAP - will treat bibasilar infiltrates as CAP for the moment 1. Cont PNA pathway 2. Empiric rocephin / azithromycin 3. COVIDnegative 4. Influenza PCRneg 5. BCxNTD 6. Monitor CBC/BMP in AM, wbc REMAINS nl 2. Acute on Chronic diastolic CHF - 1. CXR - showing worsening edema 2. BNPis elevated but clinically inconsistent 3. Heldlasix on admission, added back in 10/16, change to IV 10/18 2/2 CXR 4. Watch for fluid overload-shows 1.9 net positive-but no output initially recorded 5. c/w strict I/o 6. Monitor lytes/renal fx 3. CKD stage 3-4 1. About baseline, maybe slightly increased creat 2. Got 500cc bolus in ED,  3. As above, c/w strict Intake and output 4. Acute encephalopathy (mild)        1 Still confused but not as much, answering most questions appropriately       2 LM with wife to get a better understanding of baseline 5. Likely mild delirium  on top of chronic parkinson's dementia       1 received haldol x 1, more stable,  6. Parkinson's disease - 1. Continue sinemet once med rec completed 7. DM2 - 1. Holding PO hypoglycemics 2. Mod scale SSI AC/HS 8. HTN - 1. Continue metoprolol when med rec completed 9. A.Fib - 1. Continue metoprolol and eliquis when med rec completed.  DVT prophylaxis: QZ:[id number]  Code Status: Full    Code Status Orders  (From admission, onward)         Start     Ordered   02/22/19 2338  Full code  Continuous     02/22/19 2339        Code Status History    Date Active Date Inactive Code Status Order ID Comments User Context   10/23/2018 1914 10/25/2018 1601 Full Code [id number]  Lenore Cordia, MD ED   02/02/2017 1456 02/02/2017 2001 Full Code [id number]  [name], MD Inpatient   07/27/2016 0439 07/29/2016 1800 Full Code [id number]  Rise Patience, MD Inpatient   09/15/2015 1652 09/18/2015 2225 Full Code [id number]  Radene Gunning, NP ED   02/19/2015 2325 02/21/2015 1737 Full Code [id number]  Danford, Suann Larry, MD Inpatient   Advance Care Planning Activity    Advance Directive Documentation     Most Recent Value  Type of Advance Directive  Living will  Pre-existing out of facility DNR order (yellow form or pink MOST form)  -  "MOST" Form in Place?  -     Family communication: Left message  with wife Disposition Plan:   Patient remained inpatient continued IV antibiotics, respiratory support.  Patient is not medically stable for discharge Consults called: None Admission status: Inpatient   Consultants:   None   Antimicrobials:   Ceftriaxone and azithromycin >10/15  Procedures:  Dg Chest Port 1 View  Result Date: 02/25/2019 CLINICAL DATA:  Pneumonia EXAM: PORTABLE CHEST 1 VIEW COMPARISON:  02/23/2019; 02/22/2019; chest CT-12/09/2006 FINDINGS: Grossly unchanged cardiac silhouette and mediastinal contours post median sternotomy and CABG. Stable position of support  apparatus. Pulmonary vasculature is less distinct on the present examination now with suspected small to moderate-sized layering effusion and worsening left mid and lower lung predominant heterogeneous/consolidative opacities. Right infrahilar heterogeneous opacities are unchanged. No pneumothorax. No acute osseous abnormalities. IMPRESSION: Findings most suggestive of worsening pulmonary edema, now with small to moderate-sized layering left-sided effusion and worsening bilateral opacities, left greater than right, likely atelectasis though underlying infection not excluded. Electronically Signed   By: Sandi Mariscal M.D.   On: 02/25/2019 06:32   Dg Chest Port 1 View  Result Date: 02/23/2019 CLINICAL DATA:  Fever and weakness EXAM: PORTABLE CHEST 1 VIEW COMPARISON:  February 22, 2019 FINDINGS: There is persistent airspace consolidation in the left lower lobe with small left pleural effusion. There is mild right base atelectasis. Heart is mildly enlarged with pulmonary vascularity within normal limits. Pacemaker leads are attached to the right atrium and right ventricle. Patient is status post coronary artery bypass grafting. There is aortic atherosclerosis. No adenopathy. No bone lesions. IMPRESSION: 1. Persistent left lower lobe pneumonia with small left pleural effusion. 2. Mild right base atelectasis. 3.  Aortic Atherosclerosis (ICD10-I70.0). 4. Stable cardiac prominence. Status post coronary artery bypass grafting. Pacemaker leads attached to right atrium and right ventricle. Electronically Signed   By: Lowella Grip III M.D.   On: 02/23/2019 14:20   Dg Chest Portable 1 View  Result Date: 02/22/2019 CLINICAL DATA:  Fever and cough EXAM: PORTABLE CHEST 1 VIEW COMPARISON:  10/23/2018 FINDINGS: Post sternotomy changes. Left-sided pacing device as before. Small left-sided pleural effusion. Patchy airspace disease at the bases. Enlarged cardiomediastinal silhouette with central vascular congestion. Aortic  atherosclerosis. No pneumothorax. IMPRESSION: 1. Patchy basilar airspace disease which may reflect atelectasis or minimal infiltrates 2. Cardiomegaly with slight central congestion. Possible small left effusion Electronically Signed   By: Donavan Foil M.D.   On: 02/22/2019 21:32      Subjective: Patient answering questions appropriately this morning more so than yesterday Still somewhat confused  Objective: Vitals:   02/24/19 1700 02/24/19 2128 02/25/19 0445 02/25/19 0939  BP: (!) 159/51 (!) 168/59 140/67 (!) 145/66  Pulse: 69 60 64 (!) 59  Resp: 18 16 15 18   Temp: 98.3 F (36.8 C) 98 F (36.7 C) (!) 97.4 F (36.3 C) 97.7 F (36.5 C)  TempSrc: Oral Oral Oral Oral  SpO2: 98% 99%  100%  Weight:      Height:        Intake/Output Summary (Last 24 hours) at 02/25/2019 1124 Last data filed at 02/25/2019 1031 Gross per 24 hour  Intake 680 ml  Output 1800 ml  Net -1120 ml   Filed Weights   02/22/19 2039  Weight: 81.6 kg    Examination:  General exam: Appears calm and comfortable  Respiratory system: Rhonchi bilaterally, rales bilaterally, Cardiovascular system: S1 & S2 heard, RRR. No JVD, murmurs, rubs, gallops or clicks. No pedal edema. Gastrointestinal system: Abdomen is nondistended, soft and nontender. No organomegaly or masses felt. Normal  bowel sounds heard. Central nervous system: Alert. No focal neurological deficits. Extremities: Does move all 4 extremities freely, warm well perfused Skin: No rashes, lesions or ulcers Psychiatry: Judgement and insight impaired with known chronic dementia mood & affect flat    Data Reviewed: I have personally reviewed following labs and imaging studies  CBC: Recent Labs  Lab 02/22/19 2046 02/23/19 0106 02/24/19 0437 02/25/19 0351  WBC 12.2* 12.2* 9.8 8.0  NEUTROABS 7.6  --  5.6 4.0  HGB 9.1* 9.5* 8.5* 8.1*  HCT 29.1* 29.4* 27.4* 24.9*  MCV 110.2* 109.7* 109.6* 106.9*  PLT 136* 131* 131* 884*   Basic Metabolic Panel:  Recent Labs  Lab 02/22/19 2046 02/23/19 0106 02/24/19 0437 02/25/19 0351  NA 137 138 137 140  K 4.4 4.3 4.4 3.9  CL 103 104 104 104  CO2 23 23 25 26   GLUCOSE 259* 196* 175* 138*  BUN 46* 45* 37* 40*  CREATININE 2.07* 1.94* 1.75* 1.73*  CALCIUM 8.5* 8.2* 8.5* 8.2*   GFR: Estimated Creatinine Clearance: 25.3 mL/min (A) (by C-G formula based on SCr of 1.73 mg/dL (H)). Liver Function Tests: Recent Labs  Lab 02/22/19 2046  AST 14*  ALT 7  ALKPHOS 102  BILITOT 0.8  PROT 6.7  ALBUMIN 3.1*   No results for input(s): LIPASE, AMYLASE in the last 168 hours. No results for input(s): AMMONIA in the last 168 hours. Coagulation Profile: Recent Labs  Lab 02/22/19 2115  INR 1.4*   Cardiac Enzymes: No results for input(s): CKTOTAL, CKMB, CKMBINDEX, TROPONINI in the last 168 hours. BNP (last 3 results) No results for input(s): PROBNP in the last 8760 hours. HbA1C: Recent Labs    02/22/19 2046  HGBA1C 7.4*   CBG: Recent Labs  Lab 02/24/19 0649 02/24/19 1137 02/24/19 1611 02/24/19 2130 02/25/19 0652  GLUCAP 143* 175* 95 125* 115*   Lipid Profile: No results for input(s): CHOL, HDL, LDLCALC, TRIG, CHOLHDL, LDLDIRECT in the last 72 hours. Thyroid Function Tests: No results for input(s): TSH, T4TOTAL, FREET4, T3FREE, THYROIDAB in the last 72 hours. Anemia Panel: No results for input(s): VITAMINB12, FOLATE, FERRITIN, TIBC, IRON, RETICCTPCT in the last 72 hours. Sepsis Labs: Recent Labs  Lab 02/22/19 0117 02/22/19 2045 02/22/19 2046  PROCALCITON  --   --  <0.10  LATICACIDVEN 1.6 2.2*  --     Recent Results (from the past 240 hour(s))  Urine culture     Status: Abnormal   Collection Time: 02/22/19  1:52 AM   Specimen: In/Out Cath Urine  Result Value Ref Range Status   Specimen Description IN/OUT CATH URINE  Final   Special Requests NONE  Final   Culture (A)  Final    <10,000 COLONIES/mL INSIGNIFICANT GROWTH Performed at [facility], [address]  [place], [place] [address]    Report Status 02/24/2019 FINAL  Final  SARS CORONAVIRUS 2 (TAT 6-24 HRS) Nasopharyngeal Nasopharyngeal Swab     Status: None   Collection Time: 02/22/19  8:46 PM   Specimen: Nasopharyngeal Swab  Result Value Ref Range Status   SARS Coronavirus 2 NEGATIVE NEGATIVE Final    Comment: (NOTE) SARS-CoV-2 target nucleic acids are NOT DETECTED. The SARS-CoV-2 RNA is generally detectable in upper and lower respiratory specimens during the acute phase of infection. Negative results do not preclude SARS-CoV-2 infection, do not rule out co-infections with other pathogens, and should not be used as the sole basis for treatment or other patient management decisions. Negative results must be combined with  clinical observations, patient history, and epidemiological information. The expected result is Negative. Fact Sheet for Patients: SugarRoll.be Fact Sheet for Healthcare Providers: https://www.woods-mathews.com/ This test is not yet approved or cleared by the [place] FDA and  has been authorized for detection and/or diagnosis of SARS-CoV-2 by FDA under an Emergency Use Authorization (EUA). This EUA will remain  in effect (meaning this test can be used) for the duration of the COVID-19 declaration under Section 56 4(b)(1) of the Act, 21 U.S.C. section 360bbb-3(b)(1), unless the authorization is terminated or revoked sooner. Performed at [facility], [place] [address] [place], [place] [address]   Culture, blood (routine x 2)     Status: None (Preliminary result)   Collection Time: 02/22/19  9:02 PM   Specimen: BLOOD RIGHT FOREARM  Result Value Ref Range Status   Specimen Description BLOOD RIGHT FOREARM  Final   Special Requests   Final    BOTTLES DRAWN AEROBIC AND ANAEROBIC Blood Culture adequate volume   Culture   Final    NO GROWTH 3 DAYS Performed at [facility], [place] [address], [place], [place] [address]     Report Status PENDING  Incomplete  Culture, blood (routine x 2)     Status: None (Preliminary result)   Collection Time: 02/22/19 10:30 PM   Specimen: BLOOD LEFT WRIST  Result Value Ref Range Status   Specimen Description BLOOD LEFT WRIST  Final   Special Requests   Final    BOTTLES DRAWN AEROBIC AND ANAEROBIC Blood Culture adequate volume   Culture   Final    NO GROWTH 2 DAYS Performed at [facility], [place] [address], [place], [place] [address]    Report Status PENDING  Incomplete         Radiology Studies: Dg Chest Port 1 View  Result Date: 02/25/2019 CLINICAL DATA:  Pneumonia EXAM: PORTABLE CHEST 1 VIEW COMPARISON:  02/23/2019; 02/22/2019; chest CT-12/09/2006 FINDINGS: Grossly unchanged cardiac silhouette and mediastinal contours post median sternotomy and CABG. Stable position of support apparatus. Pulmonary vasculature is less distinct on the present examination now with suspected small to moderate-sized layering effusion and worsening left mid and lower lung predominant heterogeneous/consolidative opacities. Right infrahilar heterogeneous opacities are unchanged. No pneumothorax. No acute osseous abnormalities. IMPRESSION: Findings most suggestive of worsening pulmonary edema, now with small to moderate-sized layering left-sided effusion and worsening bilateral opacities, left greater than right, likely atelectasis though underlying infection not excluded. Electronically Signed   By: Sandi Mariscal M.D.   On: 02/25/2019 06:32   Dg Chest Port 1 View  Result Date: 02/23/2019 CLINICAL DATA:  Fever and weakness EXAM: PORTABLE CHEST 1 VIEW COMPARISON:  February 22, 2019 FINDINGS: There is persistent airspace consolidation in the left lower lobe with small left pleural effusion. There is mild right base atelectasis. Heart is mildly enlarged with pulmonary vascularity within normal limits. Pacemaker leads are attached to the right atrium and right ventricle. Patient is status post  coronary artery bypass grafting. There is aortic atherosclerosis. No adenopathy. No bone lesions. IMPRESSION: 1. Persistent left lower lobe pneumonia with small left pleural effusion. 2. Mild right base atelectasis. 3.  Aortic Atherosclerosis (ICD10-I70.0). 4. Stable cardiac prominence. Status post coronary artery bypass grafting. Pacemaker leads attached to right atrium and right ventricle. Electronically Signed   By: Lowella Grip III M.D.   On: 02/23/2019 14:20        Scheduled Meds: . apixaban  2.5 mg Oral BID  . carbidopa-levodopa  1 tablet Oral TID  . furosemide  40 mg Oral Daily  . insulin aspart  0-15 Units Subcutaneous TID WC  . insulin aspart  0-5 Units Subcutaneous QHS  . metoprolol succinate  50 mg Oral QHS  . metoprolol tartrate  100 mg Oral Daily   Continuous Infusions: . azithromycin 500 mg (02/24/19 0054)  . cefTRIAXone (ROCEPHIN)  IV 2 g (02/24/19 2323)     LOS: 3 days    Time spent: 69 min    Nicolette Bang, MD [facility]  If 7PM-7AM, please contact night-coverage  02/25/2019, 11:24 AM

## 2019-02-26 DIAGNOSIS — I5033 Acute on chronic diastolic (congestive) heart failure: Secondary | ICD-10-CM

## 2019-02-26 DIAGNOSIS — E1121 Type 2 diabetes mellitus with diabetic nephropathy: Secondary | ICD-10-CM

## 2019-02-26 DIAGNOSIS — R410 Disorientation, unspecified: Secondary | ICD-10-CM

## 2019-02-26 DIAGNOSIS — N1832 Chronic kidney disease, stage 3b: Secondary | ICD-10-CM

## 2019-02-26 LAB — GLUCOSE, CAPILLARY
Glucose-Capillary: 136 mg/dL — ABNORMAL HIGH (ref 70–99)
Glucose-Capillary: 129 mg/dL — ABNORMAL HIGH (ref 70–99)
Glucose-Capillary: 96 mg/dL (ref 70–99)
Glucose-Capillary: 143 mg/dL — ABNORMAL HIGH (ref 70–99)

## 2019-02-26 MED ORDER — QUETIAPINE FUMARATE 25 MG PO TABS
12.5000 mg | ORAL_TABLET | Freq: Every day | ORAL | Status: DC
  Administered 2019-02-26 – 2019-02-28 (×3): 12.5 mg via ORAL
  Filled 2019-02-26 (×3): qty 1

## 2019-02-26 MED ORDER — METOLAZONE 2.5 MG PO TABS
2.5000 mg | ORAL_TABLET | Freq: Once | ORAL | Status: AC
  Administered 2019-02-26: 2.5 mg via ORAL
  Filled 2019-02-26: qty 1

## 2019-02-26 MED ORDER — AMOXICILLIN-POT CLAVULANATE 250-125 MG PO TABS
1.0000 | ORAL_TABLET | Freq: Two times a day (BID) | ORAL | Status: DC
  Administered 2019-02-26 – 2019-03-01 (×7): 1 via ORAL
  Filled 2019-02-26 (×8): qty 1

## 2019-02-26 MED ORDER — HALOPERIDOL LACTATE 5 MG/ML IJ SOLN
5.0000 mg | Freq: Once | INTRAMUSCULAR | Status: AC
  Administered 2019-02-26: 5 mg via INTRAVENOUS
  Filled 2019-02-26: qty 1

## 2019-02-26 NOTE — Progress Notes (Signed)
Pt got increasingly confused last night trying to get out of bed not following simple commands.Pt assisted to recliner and taken to nursing station ,stayed there for two hours then started to get up from chair would not following simple commands pt  high fall risk ,  pt taken back to room and assisted to bed.Pt slept for one hour and woke up combative Trying  to get out of bed ,pt continued to swing legs over railings kicking staff and punching staff with mittens on.pt tried to pull off condom cath,pulling off tele box,grabbed  his IV tubing and pulled his IV pump to him and grabbed the pump which fell on top of him then took the pole and started to swing at the staff,it took three staff people to retrieve the IV pump from pt and calm him down NP B[place]heimer notified see orders will continue to monitor

## 2019-02-26 NOTE — Progress Notes (Signed)
PROGRESS NOTE    [name] Powell  XLK:[id number] DOB: 1923/01/26 DOA: 02/22/2019 PCP: [name], MD   Brief Narrative:  [name] Powell a 85 y.o.malewith medical history significant ofHTN, DM2, A.Fib on eliquis, CHB s/p PPM, parkinson's. Patient presents to ED with fever, generalized weakness, decreased appetite, and cough. Lives in ALF with his wife. Wife reports 2 episodes of coughing up pink-tinged sputum. Temp 100.9 with EMS, got 1g tylenol. Patient's wife states that he has not been acting himself and being more quiet than normal, although with his Parkinson's and dementia,he doesn't say too much at baseline.Pt cant provide much history   Assessment & Plan:   Principal Problem:   Community acquired pneumonia Active Problems:   DM2 (diabetes mellitus, type 2) ([place])   HTN (hypertension)   Chronic diastolic congestive heart failure (HCC)   Atrial fibrillation (HCC)   CKD (chronic kidney disease), stage III ([place])   Parkinson's disease ([place])   Acute encephalopathy   CAP (community acquired pneumonia)   1. CAP - will treat bibasilar infiltrates as CAP for the moment 1. Cont PNA pathway 2. Empiric rocephin / azithromycin initiated on admission; will transition to oral antibiotics and assess tolerance. 3. COVIDnegative 4. Influenza PCRneg 5. BCxNTD 6. Continue following clinical response 2. Acute on Chronic diastolic CHF - 1. CXR - showing worsening edema 2. BNPis elevated  3. Continue IV Lasix; low-sodium diet, daily weight and strict I's and O's. 4. Close follow-up on patient's renal function and electrolytes. 3. CKD stage 3-4 1. About baseline, maybe slightly increased creat 2. Got 500cc bolus in ED,  3. As above, c/w strict Intake and output. 4. Continue IV diuresis and follow daily weights. 4. Likely mild hospital-acquired delirium on top of chronic parkinson's dementia       1 received haldol x 1.  Will start low-dose Seroquel. 6. Parkinson's  disease - 1. Continue sinemet as per home regimen. 7. DM2 - 1. Holding PO hypoglycemics while inpatient. 2. Continue mod scale SSI AC/HS 8. HTN - 1. Overall stable 2. Continue metoprolol and Lasix currently. 9. A.Fib - 1. Continue metoprolol and eliquis  2. Rate controlled 3. Continue telemetry monitoring.  DVT prophylaxis: [id number]  Code Status: Full    Code Status Orders  (From admission, onward)         Start     Ordered   02/22/19 2338  Full code  Continuous     02/22/19 2339        Code Status History    Date Active Date Inactive Code Status Order ID Comments User Context   10/23/2018 1914 10/25/2018 1601 Full Code [id number]  Lenore Cordia, MD ED   02/02/2017 1456 02/02/2017 2001 Full Code [id number]  [name], MD Inpatient   07/27/2016 0439 07/29/2016 1800 Full Code [id number]  Rise Patience, MD Inpatient   09/15/2015 1652 09/18/2015 2225 Full Code [id number]  Radene Gunning, NP ED   02/19/2015 2325 02/21/2015 1737 Full Code [id number]  Danford, Suann Larry, MD Inpatient   Advance Care Planning Activity    Advance Directive Documentation     Most Recent Value  Type of Advance Directive  Living will  Pre-existing out of facility DNR order (yellow form or pink MOST form)  --  "MOST" Form in Place?  --     Family communication: Left message with wife Disposition Plan:   Patient remained inpatient continued IV diuretics nad respiratory support.  Patient is not medically stable for discharge.  We  will start the use of Seroquel for underlying hospital-acquired delirium and will transition antibiotics to oral regimen. Consults called: None Admission status: Inpatient  Consultants:   None   Antimicrobials:   Ceftriaxone and azithromycin >10/15  Procedures:  Dg Chest Port 1 View  Result Date: 02/25/2019 CLINICAL DATA:  Pneumonia EXAM: PORTABLE CHEST 1 VIEW COMPARISON:  02/23/2019; 02/22/2019; chest CT-12/09/2006 FINDINGS: Grossly unchanged cardiac  silhouette and mediastinal contours post median sternotomy and CABG. Stable position of support apparatus. Pulmonary vasculature is less distinct on the present examination now with suspected small to moderate-sized layering effusion and worsening left mid and lower lung predominant heterogeneous/consolidative opacities. Right infrahilar heterogeneous opacities are unchanged. No pneumothorax. No acute osseous abnormalities. IMPRESSION: Findings most suggestive of worsening pulmonary edema, now with small to moderate-sized layering left-sided effusion and worsening bilateral opacities, left greater than right, likely atelectasis though underlying infection not excluded. Electronically Signed   By: Sandi Mariscal M.D.   On: 02/25/2019 06:32   Dg Chest Port 1 View  Result Date: 02/23/2019 CLINICAL DATA:  Fever and weakness EXAM: PORTABLE CHEST 1 VIEW COMPARISON:  February 22, 2019 FINDINGS: There is persistent airspace consolidation in the left lower lobe with small left pleural effusion. There is mild right base atelectasis. Heart is mildly enlarged with pulmonary vascularity within normal limits. Pacemaker leads are attached to the right atrium and right ventricle. Patient is status post coronary artery bypass grafting. There is aortic atherosclerosis. No adenopathy. No bone lesions. IMPRESSION: 1. Persistent left lower lobe pneumonia with small left pleural effusion. 2. Mild right base atelectasis. 3.  Aortic Atherosclerosis (ICD10-I70.0). 4. Stable cardiac prominence. Status post coronary artery bypass grafting. Pacemaker leads attached to right atrium and right ventricle. Electronically Signed   By: Lowella Grip III M.D.   On: 02/23/2019 14:20   Dg Chest Portable 1 View  Result Date: 02/22/2019 CLINICAL DATA:  Fever and cough EXAM: PORTABLE CHEST 1 VIEW COMPARISON:  10/23/2018 FINDINGS: Post sternotomy changes. Left-sided pacing device as before. Small left-sided pleural effusion. Patchy airspace  disease at the bases. Enlarged cardiomediastinal silhouette with central vascular congestion. Aortic atherosclerosis. No pneumothorax. IMPRESSION: 1. Patchy basilar airspace disease which may reflect atelectasis or minimal infiltrates 2. Cardiomegaly with slight central congestion. Possible small left effusion Electronically Signed   By: Donavan Foil M.D.   On: 02/22/2019 21:32     Subjective: Somnolent but able to answer questions.  Denies chest pain, no nausea, no vomiting.  Patient is afebrile.  Still using 2 L nasal cannula supplementation And having fine crackles on examination.  Objective: Vitals:   02/25/19 1606 02/25/19 2122 02/26/19 0534 02/26/19 0816  BP: (!) 156/69 (!) 148/72 (!) 161/63 (!) 139/55  Pulse: 60 61 60 (!) 59  Resp: 18 14 16 18   Temp: 98 F (36.7 C) (!) 97.4 F (36.3 C) 97.7 F (36.5 C) 97.9 F (36.6 C)  TempSrc: Oral Oral Oral Oral  SpO2: 99% 94%  99%  Weight:      Height:        Intake/Output Summary (Last 24 hours) at 02/26/2019 1630 Last data filed at 02/26/2019 0998 Gross per 24 hour  Intake 240 ml  Output 1650 ml  Net -1410 ml   Filed Weights   02/22/19 2039  Weight: 81.6 kg    Examination: General exam: Alert, awake, oriented x 3; somnolent, but able to answer questions.  Reports no chest pain, no nausea, no vomiting.  Overnight with significant episode of agitation/confusion requiring Haldol injection.  Respiratory system: Fine crackles at the bases, positive rhonchi, no wheezing, no using accessory muscles.  2 L nasal cannula supplementation in place. Cardiovascular system: irregular. No murmurs, rubs, gallops. Gastrointestinal system: Abdomen is nondistended, soft and nontender. No organomegaly or masses felt. Normal bowel sounds heard. Central nervous system: Alert and oriented. No focal neurological deficits. Extremities: No cyanosis or clubbing; trace edema appreciated bilaterally. Skin: No rashes, no open wounds. Psychiatry: Judgement  and insight appear imp[aired due to current confusion and underlying dementia.  Flat affect on exam.    Data Reviewed: I have personally reviewed following labs and imaging studies  CBC: Recent Labs  Lab 02/22/19 2046 02/23/19 0106 02/24/19 0437 02/25/19 0351  WBC 12.2* 12.2* 9.8 8.0  NEUTROABS 7.6  --  5.6 4.0  HGB 9.1* 9.5* 8.5* 8.1*  HCT 29.1* 29.4* 27.4* 24.9*  MCV 110.2* 109.7* 109.6* 106.9*  PLT 136* 131* 131* 854*   Basic Metabolic Panel: Recent Labs  Lab 02/22/19 2046 02/23/19 0106 02/24/19 0437 02/25/19 0351  NA 137 138 137 140  K 4.4 4.3 4.4 3.9  CL 103 104 104 104  CO2 23 23 25 26   GLUCOSE 259* 196* 175* 138*  BUN 46* 45* 37* 40*  CREATININE 2.07* 1.94* 1.75* 1.73*  CALCIUM 8.5* 8.2* 8.5* 8.2*   GFR: Estimated Creatinine Clearance: 25.3 mL/min (A) (by C-G formula based on SCr of 1.73 mg/dL (H)).   Liver Function Tests: Recent Labs  Lab 02/22/19 2046  AST 14*  ALT 7  ALKPHOS 102  BILITOT 0.8  PROT 6.7  ALBUMIN 3.1*   Coagulation Profile: Recent Labs  Lab 02/22/19 2115  INR 1.4*   CBG: Recent Labs  Lab 02/25/19 1123 02/25/19 1603 02/25/19 2120 02/26/19 0701 02/26/19 1140  GLUCAP 181* 106* 141* 136* 129*   Sepsis Labs: Recent Labs  Lab 02/22/19 0117 02/22/19 2045 02/22/19 2046  PROCALCITON  --   --  <0.10  LATICACIDVEN 1.6 2.2*  --     Recent Results (from the past 240 hour(s))  Urine culture     Status: Abnormal   Collection Time: 02/22/19  1:52 AM   Specimen: In/Out Cath Urine  Result Value Ref Range Status   Specimen Description IN/OUT CATH URINE  Final   Special Requests NONE  Final   Culture (A)  Final    <10,000 COLONIES/mL INSIGNIFICANT GROWTH Performed at [facility], [place] [address] [place], [place] [address]    Report Status 02/24/2019 FINAL  Final  SARS CORONAVIRUS 2 (TAT 6-24 HRS) Nasopharyngeal Nasopharyngeal Swab     Status: None   Collection Time: 02/22/19  8:46 PM   Specimen: Nasopharyngeal Swab    Result Value Ref Range Status   SARS Coronavirus 2 NEGATIVE NEGATIVE Final    Comment: (NOTE) SARS-CoV-2 target nucleic acids are NOT DETECTED. The SARS-CoV-2 RNA is generally detectable in upper and lower respiratory specimens during the acute phase of infection. Negative results do not preclude SARS-CoV-2 infection, do not rule out co-infections with other pathogens, and should not be used as the sole basis for treatment or other patient management decisions. Negative results must be combined with clinical observations, patient history, and epidemiological information. The expected result is Negative. Fact Sheet for Patients: SugarRoll.be Fact Sheet for Healthcare Providers: https://www.woods-mathews.com/ This test is not yet approved or cleared by the [place] FDA and  has been authorized for detection and/or diagnosis of SARS-CoV-2 by FDA under an Emergency Use Authorization (EUA). This EUA will remain  in  effect (meaning this test can be used) for the duration of the COVID-19 declaration under Section 56 4(b)(1) of the Act, 21 U.S.C. section 360bbb-3(b)(1), unless the authorization is terminated or revoked sooner. Performed at [facility], [place] [address] [place], [place] [address]   Culture, blood (routine x 2)     Status: None (Preliminary result)   Collection Time: 02/22/19  9:02 PM   Specimen: BLOOD RIGHT FOREARM  Result Value Ref Range Status   Specimen Description BLOOD RIGHT FOREARM  Final   Special Requests   Final    BOTTLES DRAWN AEROBIC AND ANAEROBIC Blood Culture adequate volume   Culture   Final    NO GROWTH 4 DAYS Performed at [facility], [place] [address] [place], [place] [address]    Report Status PENDING  Incomplete  Culture, blood (routine x 2)     Status: None (Preliminary result)   Collection Time: 02/22/19 10:30 PM   Specimen: BLOOD LEFT WRIST  Result Value Ref Range Status   Specimen  Description BLOOD LEFT WRIST  Final   Special Requests   Final    BOTTLES DRAWN AEROBIC AND ANAEROBIC Blood Culture adequate volume   Culture   Final    NO GROWTH 3 DAYS Performed at [facility], [place] [address], [place], [place] [address]    Report Status PENDING  Incomplete     Radiology Studies: Dg Chest Port 1 View  Result Date: 02/25/2019 CLINICAL DATA:  Pneumonia EXAM: PORTABLE CHEST 1 VIEW COMPARISON:  02/23/2019; 02/22/2019; chest CT-12/09/2006 FINDINGS: Grossly unchanged cardiac silhouette and mediastinal contours post median sternotomy and CABG. Stable position of support apparatus. Pulmonary vasculature is less distinct on the present examination now with suspected small to moderate-sized layering effusion and worsening left mid and lower lung predominant heterogeneous/consolidative opacities. Right infrahilar heterogeneous opacities are unchanged. No pneumothorax. No acute osseous abnormalities. IMPRESSION: Findings most suggestive of worsening pulmonary edema, now with small to moderate-sized layering left-sided effusion and worsening bilateral opacities, left greater than right, likely atelectasis though underlying infection not excluded. Electronically Signed   By: Sandi Mariscal M.D.   On: 02/25/2019 06:32    Scheduled Meds:  amoxicillin-clavulanate  1 tablet Oral Q12H   apixaban  2.5 mg Oral BID   carbidopa-levodopa  1 tablet Oral TID   furosemide  40 mg Intravenous BID   insulin aspart  0-15 Units Subcutaneous TID WC   insulin aspart  0-5 Units Subcutaneous QHS   metoprolol succinate  50 mg Oral QHS   metoprolol tartrate  100 mg Oral Daily   QUEtiapine  12.5 mg Oral QHS   Continuous Infusions:    LOS: 4 days    Time spent: 30 min    Barton Dubois, MD [phone] [facility]  If 7PM-7AM, please contact night-coverage  02/26/2019, 4:30 PM

## 2019-02-27 LAB — BASIC METABOLIC PANEL
Anion gap: 13 (ref 5–15)
BUN: 51 mg/dL — ABNORMAL HIGH (ref 8–23)
CO2: 31 mmol/L (ref 22–32)
Calcium: 9.2 mg/dL (ref 8.9–10.3)
Chloride: 97 mmol/L — ABNORMAL LOW (ref 98–111)
Creatinine, Ser: 2.31 mg/dL — ABNORMAL HIGH (ref 0.61–1.24)
GFR calc Af Amer: 27 mL/min — ABNORMAL LOW (ref >60)
GFR calc non Af Amer: 23 mL/min — ABNORMAL LOW (ref >60)
Glucose, Bld: 88 mg/dL (ref 70–99)
Potassium: 3.3 mmol/L — ABNORMAL LOW (ref 3.5–5.1)
Sodium: 141 mmol/L (ref 135–145)

## 2019-02-27 LAB — GLUCOSE, CAPILLARY
Glucose-Capillary: 82 mg/dL (ref 70–99)
Glucose-Capillary: 168 mg/dL — ABNORMAL HIGH (ref 70–99)
Glucose-Capillary: 112 mg/dL — ABNORMAL HIGH (ref 70–99)
Glucose-Capillary: 143 mg/dL — ABNORMAL HIGH (ref 70–99)

## 2019-02-27 LAB — CBC
HCT: 28 % — ABNORMAL LOW (ref 39.0–52.0)
Hemoglobin: 8.7 g/dL — ABNORMAL LOW (ref 13.0–17.0)
MCH: 33.9 pg (ref 26.0–34.0)
MCHC: 31.1 g/dL (ref 30.0–36.0)
MCV: 108.9 fL — ABNORMAL HIGH (ref 80.0–100.0)
Platelets: 180 10*3/uL (ref 150–400)
RBC: 2.57 MIL/uL — ABNORMAL LOW (ref 4.22–5.81)
RDW: 15.9 % — ABNORMAL HIGH (ref 11.5–15.5)
WBC: 5.3 10*3/uL (ref 4.0–10.5)
nRBC: 0 % (ref 0.0–0.2)

## 2019-02-27 LAB — CULTURE, BLOOD (ROUTINE X 2)
Culture: NO GROWTH
Special Requests: ADEQUATE

## 2019-02-27 MED ORDER — DM-GUAIFENESIN ER 30-600 MG PO TB12
1.0000 | ORAL_TABLET | Freq: Two times a day (BID) | ORAL | Status: DC
  Administered 2019-02-27 – 2019-03-01 (×5): 1 via ORAL
  Filled 2019-02-27 (×5): qty 1

## 2019-02-27 NOTE — Care Management Important Message (Signed)
Important Message  Patient Details  Name: [name] Powell MRN: [id number] Date of Birth: 03/29/1923   Medicare Important Message Given:  Yes     Tyke Outman 02/27/2019, 1:52 PM

## 2019-02-27 NOTE — Progress Notes (Addendum)
PROGRESS NOTE    YEHUDAH Powell  VZD:[id number] DOB: 07-18-1922 DOA: 02/22/2019 PCP: [name], MD   Brief Narrative:  [name] Chamber Burneyis a 85 y.o.malewith medical history significant ofHTN, DM2, A.Fib on eliquis, CHB s/p PPM, parkinson's. Patient presents to ED with fever, generalized weakness, decreased appetite, and cough. Lives in ALF with his wife. Wife reports 2 episodes of coughing up pink-tinged sputum. Temp 100.9 with EMS, got 1g tylenol. Patient's wife states that he has not been acting himself and being more quiet than normal, although with his Parkinson's and dementia,he doesn't say too much at baseline.Pt cant provide much history   Assessment & Plan:   Principal Problem:   Community acquired pneumonia Active Problems:   DM2 (diabetes mellitus, type 2) ([place])   HTN (hypertension)   Chronic diastolic congestive heart failure (HCC)   Atrial fibrillation (HCC)   CKD (chronic kidney disease), stage III ([place])   Parkinson's disease ([place])   Acute encephalopathy   CAP (community acquired pneumonia)   1. CAP - will treat bibasilar infiltrates as CAP for the moment 1. Cont PNA pathway, continues to improve 2. Empiric rocephin / azithromycin initiated on admission; will transition to oral antibiotics and assess tolerance. 3. COVIDnegative 4. Influenza PCRneg 5. BCxNTD 6. Continue following clinical response 2. Acute on Chronic diastolic CHF - 1. CXR - showed worsening edema 10/18-recheck in AM 2. BNPis elevated  3. D/c  IV Lasix, cont zarox; low-sodium diet, daily weight and strict I's and O's. 4. Close follow-up on patient's renal function and electrolytes. 3. CKD stage 3-4 1. Bump in cr 1.75>1.73>>2.31 2. Got 500cc bolus in ED, 3. As above, c/w strictIntake and output. 4. D/c IV diuresis, cont  po and follow daily weights. 4. Likely mild hospital-acquired delirium on top of chronic parkinson's dementia 1 received haldol x 1.  started  low-dose Seroquel. 10/19       2 still confused but improving 6. Parkinson's disease - 1. Continue sinemet as per home regimen. 7. DM2 - 1. Holding PO hypoglycemics while inpatient. 2. Continue mod scale SSI AC/HS 8. HTN - 1. Overall stable 2. Continue metoprolol and Lasix currently. 9. A.Fib - 1. Continue metoprolol and eliquis  2. Rate controlled 3. Continue telemetry monitoring.  DVT prophylaxis: IR:[id number]  Code Status: Full    Code Status Orders  (From admission, onward)         Start     Ordered   02/22/19 2338  Full code  Continuous     02/22/19 2339        Code Status History    Date Active Date Inactive Code Status Order ID Comments User Context   10/23/2018 1914 10/25/2018 1601 Full Code [id number]  Lenore Cordia, MD ED   02/02/2017 1456 02/02/2017 2001 Full Code [id number]  [name], MD Inpatient   07/27/2016 0439 07/29/2016 1800 Full Code [id number]  Rise Patience, MD Inpatient   09/15/2015 1652 09/18/2015 2225 Full Code [id number]  Radene Gunning, NP ED   02/19/2015 2325 02/21/2015 1737 Full Code [id number]  Danford, Suann Larry, MD Inpatient   Advance Care Planning Activity    Advance Directive Documentation     Most Recent Value  Type of Advance Directive  Living will  Pre-existing out of facility DNR order (yellow form or pink MOST form)  -  "MOST" Form in Place?  -     Family Communication: none today  Disposition Plan:   Patient remained inpatient continued IV diuretics nad  respiratory support. Patient is not medically stable for discharge with active delirium. Continuing Seroquel for underlying hospital-acquired delirium  Consults called: None Admission status: Inpatient   Consultants:   None  Procedures:  Dg Chest Port 1 View  Result Date: 02/25/2019 CLINICAL DATA:  Pneumonia EXAM: PORTABLE CHEST 1 VIEW COMPARISON:  02/23/2019; 02/22/2019; chest CT-12/09/2006 FINDINGS: Grossly unchanged cardiac silhouette and mediastinal contours  post median sternotomy and CABG. Stable position of support apparatus. Pulmonary vasculature is less distinct on the present examination now with suspected small to moderate-sized layering effusion and worsening left mid and lower lung predominant heterogeneous/consolidative opacities. Right infrahilar heterogeneous opacities are unchanged. No pneumothorax. No acute osseous abnormalities. IMPRESSION: Findings most suggestive of worsening pulmonary edema, now with small to moderate-sized layering left-sided effusion and worsening bilateral opacities, left greater than right, likely atelectasis though underlying infection not excluded. Electronically Signed   By: Sandi Mariscal M.D.   On: 02/25/2019 06:32   Dg Chest Port 1 View  Result Date: 02/23/2019 CLINICAL DATA:  Fever and weakness EXAM: PORTABLE CHEST 1 VIEW COMPARISON:  February 22, 2019 FINDINGS: There is persistent airspace consolidation in the left lower lobe with small left pleural effusion. There is mild right base atelectasis. Heart is mildly enlarged with pulmonary vascularity within normal limits. Pacemaker leads are attached to the right atrium and right ventricle. Patient is status post coronary artery bypass grafting. There is aortic atherosclerosis. No adenopathy. No bone lesions. IMPRESSION: 1. Persistent left lower lobe pneumonia with small left pleural effusion. 2. Mild right base atelectasis. 3.  Aortic Atherosclerosis (ICD10-I70.0). 4. Stable cardiac prominence. Status post coronary artery bypass grafting. Pacemaker leads attached to right atrium and right ventricle. Electronically Signed   By: Lowella Grip III M.D.   On: 02/23/2019 14:20   Dg Chest Portable 1 View  Result Date: 02/22/2019 CLINICAL DATA:  Fever and cough EXAM: PORTABLE CHEST 1 VIEW COMPARISON:  10/23/2018 FINDINGS: Post sternotomy changes. Left-sided pacing device as before. Small left-sided pleural effusion. Patchy airspace disease at the bases. Enlarged  cardiomediastinal silhouette with central vascular congestion. Aortic atherosclerosis. No pneumothorax. IMPRESSION: 1. Patchy basilar airspace disease which may reflect atelectasis or minimal infiltrates 2. Cardiomegaly with slight central congestion. Possible small left effusion Electronically Signed   By: Donavan Foil M.D.   On: 02/22/2019 21:32     Antimicrobials:   Ceftriaxone and azithromycin>10/15-10/19   augementin 10/19  Subjective: Confused but significant improvement from prior encounter on October 18   Objective: Vitals:   02/26/19 2054 02/26/19 2144 02/26/19 2144 02/27/19 0610  BP: (!) 128/55   (!) 130/55  Pulse: (!) 59 (!) 59 61 60  Resp: 18   16  Temp: 97.7 F (36.5 C)   98 F (36.7 C)  TempSrc:    Oral  SpO2: 98% 100% 100% 100%  Weight:      Height:        Intake/Output Summary (Last 24 hours) at 02/27/2019 0849 Last data filed at 02/27/2019 0610 Gross per 24 hour  Intake 360 ml  Output 1000 ml  Net -640 ml   Filed Weights   02/22/19 2039  Weight: 81.6 kg    Examination:  General exam: Alert, awake, less somnolent, mild confusion.  Reports no chest pain, no nausea, no vomiting.  Overnight with significant episode of agitation/confusion requiring Haldol injection. Respiratory system:  Continued fine crackles at the bases, positive rhonchi, no wheezing, no using accessory muscles.  2 L nasal cannula supplementation in place. Cardiovascular system: irregular.  No murmurs, rubs, gallops. Gastrointestinal system: Abdomen is nondistended, soft and nontender. No organomegaly or masses felt. Normal bowel sounds heard. Central nervous system: Alert, chronic parkinsonian features but no acute focal neurological deficits. Extremities: No cyanosis or clubbing; trace edema appreciated bilaterally. Skin: No rashes, no open wounds. Psychiatry: Judgement and insight appear impaired due to current confusion and underlying dementia.  Flat affect on exam.    Data  Reviewed: I have personally reviewed following labs and imaging studies  CBC: Recent Labs  Lab 02/22/19 2046 02/23/19 0106 02/24/19 0437 02/25/19 0351 02/27/19 0617  WBC 12.2* 12.2* 9.8 8.0 5.3  NEUTROABS 7.6  --  5.6 4.0  --   HGB 9.1* 9.5* 8.5* 8.1* 8.7*  HCT 29.1* 29.4* 27.4* 24.9* 28.0*  MCV 110.2* 109.7* 109.6* 106.9* 108.9*  PLT 136* 131* 131* 129* 751   Basic Metabolic Panel: Recent Labs  Lab 02/22/19 2046 02/23/19 0106 02/24/19 0437 02/25/19 0351 02/27/19 0617  NA 137 138 137 140 141  K 4.4 4.3 4.4 3.9 3.3*  CL 103 104 104 104 97*  CO2 23 23 25 26 31   GLUCOSE 259* 196* 175* 138* 88  BUN 46* 45* 37* 40* 51*  CREATININE 2.07* 1.94* 1.75* 1.73* 2.31*  CALCIUM 8.5* 8.2* 8.5* 8.2* 9.2   GFR: Estimated Creatinine Clearance: 18.9 mL/min (A) (by C-G formula based on SCr of 2.31 mg/dL (H)). Liver Function Tests: Recent Labs  Lab 02/22/19 2046  AST 14*  ALT 7  ALKPHOS 102  BILITOT 0.8  PROT 6.7  ALBUMIN 3.1*   No results for input(s): LIPASE, AMYLASE in the last 168 hours. No results for input(s): AMMONIA in the last 168 hours. Coagulation Profile: Recent Labs  Lab 02/22/19 2115  INR 1.4*   Cardiac Enzymes: No results for input(s): CKTOTAL, CKMB, CKMBINDEX, TROPONINI in the last 168 hours. BNP (last 3 results) No results for input(s): PROBNP in the last 8760 hours. HbA1C: No results for input(s): HGBA1C in the last 72 hours. CBG: Recent Labs  Lab 02/26/19 0701 02/26/19 1140 02/26/19 1632 02/26/19 2053 02/27/19 0650  GLUCAP 136* 129* 96 143* 82   Lipid Profile: No results for input(s): CHOL, HDL, LDLCALC, TRIG, CHOLHDL, LDLDIRECT in the last 72 hours. Thyroid Function Tests: No results for input(s): TSH, T4TOTAL, FREET4, T3FREE, THYROIDAB in the last 72 hours. Anemia Panel: No results for input(s): VITAMINB12, FOLATE, FERRITIN, TIBC, IRON, RETICCTPCT in the last 72 hours. Sepsis Labs: Recent Labs  Lab 02/22/19 0117 02/22/19 2045 02/22/19  2046  PROCALCITON  --   --  <0.10  LATICACIDVEN 1.6 2.2*  --     Recent Results (from the past 240 hour(s))  Urine culture     Status: Abnormal   Collection Time: 02/22/19  1:52 AM   Specimen: In/Out Cath Urine  Result Value Ref Range Status   Specimen Description IN/OUT CATH URINE  Final   Special Requests NONE  Final   Culture (A)  Final    <10,000 COLONIES/mL INSIGNIFICANT GROWTH Performed at [facility], [address], [place], [place] [address]    Report Status 02/24/2019 FINAL  Final  SARS CORONAVIRUS 2 (TAT 6-24 HRS) Nasopharyngeal Nasopharyngeal Swab     Status: None   Collection Time: 02/22/19  8:46 PM   Specimen: Nasopharyngeal Swab  Result Value Ref Range Status   SARS Coronavirus 2 NEGATIVE NEGATIVE Final    Comment: (NOTE) SARS-CoV-2 target nucleic acids are NOT DETECTED. The SARS-CoV-2 RNA is generally detectable in upper and lower  respiratory specimens during the acute phase of infection. Negative results do not preclude SARS-CoV-2 infection, do not rule out co-infections with other pathogens, and should not be used as the sole basis for treatment or other patient management decisions. Negative results must be combined with clinical observations, patient history, and epidemiological information. The expected result is Negative. Fact Sheet for Patients: SugarRoll.be Fact Sheet for Healthcare Providers: https://www.woods-mathews.com/ This test is not yet approved or cleared by the [place] FDA and  has been authorized for detection and/or diagnosis of SARS-CoV-2 by FDA under an Emergency Use Authorization (EUA). This EUA will remain  in effect (meaning this test can be used) for the duration of the COVID-19 declaration under Section 56 4(b)(1) of the Act, 21 U.S.C. section 360bbb-3(b)(1), unless the authorization is terminated or revoked sooner. Performed at [facility], [place] [address],  [place], [place] [address]   Culture, blood (routine x 2)     Status: None   Collection Time: 02/22/19  9:02 PM   Specimen: BLOOD RIGHT FOREARM  Result Value Ref Range Status   Specimen Description BLOOD RIGHT FOREARM  Final   Special Requests   Final    BOTTLES DRAWN AEROBIC AND ANAEROBIC Blood Culture adequate volume   Culture   Final    NO GROWTH 5 DAYS Performed at [facility], [place] [address] [place], [place] [address]    Report Status 02/27/2019 FINAL  Final  Culture, blood (routine x 2)     Status: None (Preliminary result)   Collection Time: 02/22/19 10:30 PM   Specimen: BLOOD LEFT WRIST  Result Value Ref Range Status   Specimen Description BLOOD LEFT WRIST  Final   Special Requests   Final    BOTTLES DRAWN AEROBIC AND ANAEROBIC Blood Culture adequate volume   Culture   Final    NO GROWTH 4 DAYS Performed at [facility], [place] [address] [place], [place] [address]    Report Status PENDING  Incomplete         Radiology Studies: No results found.      Scheduled Meds: . amoxicillin-clavulanate  1 tablet Oral Q12H  . apixaban  2.5 mg Oral BID  . carbidopa-levodopa  1 tablet Oral TID  . furosemide  40 mg Intravenous BID  . insulin aspart  0-15 Units Subcutaneous TID WC  . insulin aspart  0-5 Units Subcutaneous QHS  . metoprolol succinate  50 mg Oral QHS  . metoprolol tartrate  100 mg Oral Daily  . QUEtiapine  12.5 mg Oral QHS   Continuous Infusions:   LOS: 5 days    Time spent: 35 min    Nicolette Bang, MD [facility]  If 7PM-7AM, please contact night-coverage  02/27/2019, 8:49 AM

## 2019-02-27 NOTE — Plan of Care (Signed)
°  Problem: Coping: °Goal: Level of anxiety will decrease °Outcome: Progressing °  °

## 2019-02-28 ENCOUNTER — Inpatient Hospital Stay (HOSPITAL_COMMUNITY): Payer: Medicare HMO

## 2019-02-28 DIAGNOSIS — I5032 Chronic diastolic (congestive) heart failure: Secondary | ICD-10-CM

## 2019-02-28 DIAGNOSIS — I4821 Permanent atrial fibrillation: Secondary | ICD-10-CM | POA: Diagnosis not present

## 2019-02-28 LAB — GLUCOSE, CAPILLARY
Glucose-Capillary: 109 mg/dL — ABNORMAL HIGH (ref 70–99)
Glucose-Capillary: 201 mg/dL — ABNORMAL HIGH (ref 70–99)
Glucose-Capillary: 116 mg/dL — ABNORMAL HIGH (ref 70–99)
Glucose-Capillary: 146 mg/dL — ABNORMAL HIGH (ref 70–99)

## 2019-02-28 LAB — BASIC METABOLIC PANEL
Anion gap: 12 (ref 5–15)
BUN: 53 mg/dL — ABNORMAL HIGH (ref 8–23)
CO2: 32 mmol/L (ref 22–32)
Calcium: 9 mg/dL (ref 8.9–10.3)
Chloride: 96 mmol/L — ABNORMAL LOW (ref 98–111)
Creatinine, Ser: 2.34 mg/dL — ABNORMAL HIGH (ref 0.61–1.24)
GFR calc Af Amer: 27 mL/min — ABNORMAL LOW (ref >60)
GFR calc non Af Amer: 23 mL/min — ABNORMAL LOW (ref >60)
Glucose, Bld: 103 mg/dL — ABNORMAL HIGH (ref 70–99)
Potassium: 3.3 mmol/L — ABNORMAL LOW (ref 3.5–5.1)
Sodium: 140 mmol/L (ref 135–145)

## 2019-02-28 LAB — CULTURE, BLOOD (ROUTINE X 2)
Culture: NO GROWTH
Special Requests: ADEQUATE

## 2019-02-28 LAB — CBC WITH DIFFERENTIAL/PLATELET
Abs Immature Granulocytes: 0.07 10*3/uL (ref 0.00–0.07)
Basophils Absolute: 0 10*3/uL (ref 0.0–0.1)
Basophils Relative: 0 %
Eosinophils Absolute: 0.1 10*3/uL (ref 0.0–0.5)
Eosinophils Relative: 2 %
HCT: 27.7 % — ABNORMAL LOW (ref 39.0–52.0)
Hemoglobin: 9 g/dL — ABNORMAL LOW (ref 13.0–17.0)
Immature Granulocytes: 1 %
Lymphocytes Relative: 18 %
Lymphs Abs: 0.9 10*3/uL (ref 0.7–4.0)
MCH: 34.2 pg — ABNORMAL HIGH (ref 26.0–34.0)
MCHC: 32.5 g/dL (ref 30.0–36.0)
MCV: 105.3 fL — ABNORMAL HIGH (ref 80.0–100.0)
Monocytes Absolute: 1.8 10*3/uL — ABNORMAL HIGH (ref 0.1–1.0)
Monocytes Relative: 34 %
Neutro Abs: 2.4 10*3/uL (ref 1.7–7.7)
Neutrophils Relative %: 45 %
Platelets: 190 10*3/uL (ref 150–400)
RBC: 2.63 MIL/uL — ABNORMAL LOW (ref 4.22–5.81)
RDW: 15.7 % — ABNORMAL HIGH (ref 11.5–15.5)
WBC: 5.2 10*3/uL (ref 4.0–10.5)
nRBC: 0 % (ref 0.0–0.2)

## 2019-02-28 NOTE — Evaluation (Signed)
Physical Therapy Evaluation Patient Details Name: [name] Powell MRN: [id number] DOB: 04-18-1923 Today's Date: 02/28/2019   History of Present Illness  Pt is a 85 y/o male with a PMH significant for Parkinson's dementia, HTN, DMII, a-fib, CHB s/p PPM. He presents to the ED with fever, generalized weakness, and cough. He was found to have CAP and was admitted for further management.   Clinical Impression  Pt admitted with above diagnosis. At the time of PT eval pt was able to perform transfers with up to +2 mod assist for balance support and safety. We were not able to progress to gait training at this time. Wife present and provided PLOF information. Pt currently with functional limitations due to the deficits listed below (see PT Problem List). Pt will benefit from skilled PT to increase their independence and safety with mobility to allow discharge to the venue listed below.     Follow Up Recommendations SNF;Supervision/Assistance - 24 hour    Equipment Recommendations  None recommended by PT    Recommendations for Other Services       Precautions / Restrictions Precautions Precautions: Fall Restrictions Weight Bearing Restrictions: No      Mobility  Bed Mobility Overal bed mobility: Needs Assistance Bed Mobility: Rolling;Sidelying to Sit Rolling: Mod assist Sidelying to sit: Max assist;Mod assist       General bed mobility comments: assit with LEs off EOB and trunk elevation  Transfers Overall transfer level: Needs assistance Equipment used: Rolling walker (2 wheeled) Transfers: Sit to/from [facility] Sit to Stand: Mod assist Stand pivot transfers: Mod assist;+2 physical assistance;+2 safety/equipment       General transfer comment: Multimodal cues for hand placement and to facilitate standing. Pt with difficulty advancing feet around to [facility]. When standing from [facility], pt was able to hold himself steady with RW for support and min assist +1 while OT  assisted with peri-care (total assist to clean after bowel movement).   Ambulation/Gait             General Gait Details: Unable to progress to gait training at this time.   Stairs            Wheelchair Mobility    Modified Rankin (Stroke Patients Only)       Balance Overall balance assessment: Needs assistance Sitting-balance support: Bilateral upper extremity supported;Feet supported Sitting balance-Leahy Scale: Fair     Standing balance support: Bilateral upper extremity supported;During functional activity Standing balance-Leahy Scale: Poor                               Pertinent Vitals/Pain Pain Assessment: No/denies pain Pain Intervention(s): Monitored during session    Home Living Family/patient expects to be discharged to:: Private residence([facility]) Living Arrangements: Spouse/significant other Available Help at Discharge: Family;Personal care attendant;Available 24 hours/day Type of Home: Apartment Home Access: Stairs to enter Entrance Stairs-Rails: Right Entrance Stairs-Number of Steps: level entry at front, 5-6 steps in back Home Layout: One level Home Equipment: Grab bars - tub/shower;Grab bars - toilet;Shower seat - built in;Shower seat;Walker - 2 wheels;Cane - single point;Wheelchair - [employer] - 4 wheels Additional Comments: Wife with him at night and 2 aides come in ~12 hours a day    Prior Function Level of Independence: Needs assistance   Gait / Transfers Assistance Needed: Uses the cane mostly inside. Has PT coming in 2x/week. Longer distance uses rollator.   ADL's / Homemaking Assistance Needed:  Wife assists with shower (he washes upper body and she does lower body) and dressing. Aides do housework, helps him with bathroom, Dr. visits, walks for exercise.        Hand Dominance   Dominant Hand: Right    Extremity/Trunk Assessment   Upper Extremity Assessment Upper Extremity Assessment: Generalized  weakness    Lower Extremity Assessment Lower Extremity Assessment: Generalized weakness    Cervical / Trunk Assessment Cervical / Trunk Assessment: Other exceptions Cervical / Trunk Exceptions: Forward head posture with rounded shoulders  Communication   Communication: HOH  Cognition Arousal/Alertness: Awake/alert Behavior During Therapy: Flat affect Overall Cognitive Status: History of cognitive impairments - at baseline                                        General Comments      Exercises     Assessment/Plan    PT Assessment Patient needs continued PT services  PT Problem List Decreased strength;Decreased balance;Decreased activity tolerance;Decreased mobility;Decreased knowledge of use of DME;Decreased safety awareness;Decreased knowledge of precautions;Cardiopulmonary status limiting activity       PT Treatment Interventions DME instruction;Gait training;Functional mobility training;Therapeutic activities;Therapeutic exercise;Neuromuscular re-education;Patient/family education    PT Goals (Current goals can be found in the Care Plan section)  Acute Rehab PT Goals Patient Stated Goal: get stronger, go home per wife PT Goal Formulation: With family Time For Goal Achievement: 03/14/19 Potential to Achieve Goals: Fair    Frequency Min 2X/week   Barriers to discharge        Co-evaluation PT/OT/SLP Co-Evaluation/Treatment: Yes Reason for Co-Treatment: For patient/therapist safety;To address functional/ADL transfers PT goals addressed during session: Mobility/safety with mobility;Balance;Proper use of DME OT goals addressed during session: ADL's and self-care;Proper use of Adaptive equipment and DME       AM-PAC PT "6 Clicks" Mobility  Outcome Measure Help needed turning from your back to your side while in a flat bed without using bedrails?: A Little Help needed moving from lying on your back to sitting on the side of a flat bed without using  bedrails?: A Lot Help needed moving to and from a bed to a chair (including a wheelchair)?: A Lot Help needed standing up from a chair using your arms (e.g., wheelchair or bedside chair)?: A Lot Help needed to walk in hospital room?: Total Help needed climbing 3-5 steps with a railing? : Total 6 Click Score: 11    End of Session Equipment Utilized During Treatment: Gait belt Activity Tolerance: Patient tolerated treatment well Patient left: in chair;with call bell/phone within reach;with chair alarm set;with family/visitor present([place] not reapplied per wife's request. RN notified) Nurse Communication: Mobility status PT Visit Diagnosis: Unsteadiness on feet (R26.81);Muscle weakness (generalized) (M62.81)    Time: 7829-5621 PT Time Calculation (min) (ACUTE ONLY): 39 min   Charges:   PT Evaluation $PT Eval Moderate Complexity: 1 Mod PT Treatments $Gait Training: 8-22 mins        Rolinda Roan, PT, DPT Acute Rehabilitation Services Pager: [phone] Office: [phone]   Thelma Comp 02/28/2019, 2:25 PM

## 2019-02-28 NOTE — Consult Note (Signed)
   [facility] Inpatient Consult   02/28/2019  STALEY LUNZ 08/29/1922 [id number]    Patientchecked for potential [facility] care management services needed with  22% highrisk score forunplanned readmission and hospitalizations under hisHumana Medicare/ NextGenplan.  Patient had been outreached by [facility] telephonic care management coordinator in distant past for EMMI HF follow-up calls.  Chart review andMD brief narrative show as follows: Malachy Chamber Burneyis a 85 y.o.malewith medical history significant ofHTN, DM2, A.Fib on eliquis, CHB s/p PPM, parkinson's.    Patient presents to ED with fever, generalized weakness, decreased appetite, and cough. Lives in ALF ([facility]) with his wife. Wife reports 2 episodes of coughing up pink-tinged sputum. Temp 100.9 with EMS, got 1g tylenol. Patient's wife states that he has not been acting himself and being more quiet than normal, although with his Parkinson's and dementia,he doesn't say too much at baseline. [Community acquired pneumonia, Acute on Chronic diastolic CHF]   PT/ OT notes reviewed, show that patient is being recommended to SNF (skilled nursing facility)upon discharge.  Patient's primary care provideris Dr. [name] with [facility], listed as providing transition of care follow-up.  Will followforprogress anddisposition.Ifthere are anydisposition changes,and needs for appropriate community follow-up,please referto [facility] care management.   For questions and additional information, please call:  Martia Dalby A. Nekeshia Lenhardt, BSN, RN-BC [facility] Liaison Cell: [phone]

## 2019-02-28 NOTE — Progress Notes (Signed)
PROGRESS NOTE    [name] Powell  GMW:[id number] DOB: 1922-09-21 DOA: 02/22/2019 PCP: [name], MD    Brief Narrative:  85 y.o.malewith medical history significant ofHTN, DM2, A.Fib on eliquis, CHB s/p PPM, parkinson's. Patient presents to ED with fever, generalized weakness, decreased appetite, and cough. Lives in ALF with his wife. Wife reports 2 episodes of coughing up pink-tinged sputum. Temp 100.9 with EMS, got 1g tylenol. Patient's wife states that he has not been acting himself and being more quiet than normal, although with his Parkinson's and dementia,he doesn't say too much at baseline.Pt cant provide much history  Assessment & Plan:   Principal Problem:   Community acquired pneumonia Active Problems:   DM2 (diabetes mellitus, type 2) ([place])   HTN (hypertension)   Chronic diastolic congestive heart failure (HCC)   Atrial fibrillation (HCC)   CKD (chronic kidney disease), stage III ([place])   Parkinson's disease ([place])   Acute encephalopathy   CAP (community acquired pneumonia)  1. CAP - will treat bibasilar infiltrates as CAP for the moment 1. Cont PNA pathway, continues to improve 2. Had been on empiric rocephin / azithromycininitiated on admission, now on PO augmentin 3. COVIDnegative 4. Influenza PCRneg 5. BCxNTD 6. Stable 2. Acute on Chronic diastolic CHF - 1. CXR - showed worsening edema 10/18-recheck in AM 2. BNPnoted to be elevated 3. Pt was on diuretics with good response, lasix now on hold given ARF 4. Repeat bmet in AM. 3. Acute on CKD stage 3-4 1. Got 500cc bolus in ED, 2. As above, c/w strictIntake and output. 3. D/c IV diuresis, cont  po and follow daily weights. 4. Cr has peaked to 2.34 today 5. Repeat bmet in AM 4. Likely mildhospital-acquireddelirium on top of chronic parkinson's dementia 1 received haldol x 1.started low-dose Seroquel. 10/19       2 Seems stable at present 6. Parkinson's disease - 1. Continue  sinemetas per home regimen. 2. Stable at this time 7. DM2 - 1. Holding PO hypoglycemicswhile inpatient. 2. Continue mod scale SSI AC/HS 3. Currently stable 8. HTN - 1. Overall stable 2. Continue metoprolol 3. Lasix remains on hold 9. A.Fib - 1. Continue metoprolol and eliquis 2. Remains rate controlled 3. Continue telemetry monitoring.  DVT prophylaxis: Eliquis Code Status: Full Family Communication: Pt in room, family not at bedside Disposition Plan: Uncertain at this time  Consultants:     Procedures:     Antimicrobials: Anti-infectives (From admission, onward)   Start     Dose/Rate Route Frequency Ordered Stop   02/26/19 1000  amoxicillin-clavulanate (AUGMENTIN) 250-125 MG per tablet 1 tablet     1 tablet Oral Every 12 hours 02/26/19 0737 03/02/19 0959   02/22/19 2200  cefTRIAXone (ROCEPHIN) 2 g in sodium chloride 0.9 % 100 mL IVPB  Status:  Discontinued     2 g 200 mL/hr over 30 Minutes Intravenous Every 24 hours 02/22/19 2158 02/26/19 0737   02/22/19 2200  azithromycin (ZITHROMAX) 500 mg in sodium chloride 0.9 % 250 mL IVPB  Status:  Discontinued     500 mg 250 mL/hr over 60 Minutes Intravenous Every 24 hours 02/22/19 2158 02/26/19 0737       Subjective: Pleasant this AM, reports feeling tired  Objective: Vitals:   02/27/19 2144 02/28/19 0534 02/28/19 0834 02/28/19 1603  BP: (!) 159/72 (!) 143/64 (!) 141/63 123/62  Pulse: (!) 59 (!) 59 (!) 59 (!) 59  Resp: 18 18 18 18   Temp: 97.8 F (36.6 C)  98  F (36.7 C) 97.7 F (36.5 C)  TempSrc:   Oral Oral  SpO2: 97%  98% 93%  Weight:      Height:        Intake/Output Summary (Last 24 hours) at 02/28/2019 1746 Last data filed at 02/28/2019 1300 Gross per 24 hour  Intake 600 ml  Output 2450 ml  Net -1850 ml   Filed Weights   02/22/19 2039  Weight: 81.6 kg    Examination:  General exam: Appears calm and comfortable  Respiratory system: Clear to auscultation. Respiratory effort normal.  Cardiovascular system: S1 & S2 heard, RRR. Gastrointestinal system: Abdomen is nondistended, soft and nontender. No organomegaly or masses felt. Normal bowel sounds heard. Central nervous system: Alert. No focal neurological deficits. Extremities: Symmetric 5 x 5 power. Skin: No rashes, lesions  Psychiatry: Judgement and insight appear normal. Mood & affect appropriate.   Data Reviewed: I have personally reviewed following labs and imaging studies  CBC: Recent Labs  Lab 02/22/19 2046 02/23/19 0106 02/24/19 0437 02/25/19 0351 02/27/19 0617 02/28/19 0531  WBC 12.2* 12.2* 9.8 8.0 5.3 5.2  NEUTROABS 7.6  --  5.6 4.0  --  2.4  HGB 9.1* 9.5* 8.5* 8.1* 8.7* 9.0*  HCT 29.1* 29.4* 27.4* 24.9* 28.0* 27.7*  MCV 110.2* 109.7* 109.6* 106.9* 108.9* 105.3*  PLT 136* 131* 131* 129* 180 562   Basic Metabolic Panel: Recent Labs  Lab 02/23/19 0106 02/24/19 0437 02/25/19 0351 02/27/19 0617 02/28/19 0531  NA 138 137 140 141 140  K 4.3 4.4 3.9 3.3* 3.3*  CL 104 104 104 97* 96*  CO2 23 25 26 31  32  GLUCOSE 196* 175* 138* 88 103*  BUN 45* 37* 40* 51* 53*  CREATININE 1.94* 1.75* 1.73* 2.31* 2.34*  CALCIUM 8.2* 8.5* 8.2* 9.2 9.0   GFR: Estimated Creatinine Clearance: 18.7 mL/min (A) (by C-G formula based on SCr of 2.34 mg/dL (H)). Liver Function Tests: Recent Labs  Lab 02/22/19 2046  AST 14*  ALT 7  ALKPHOS 102  BILITOT 0.8  PROT 6.7  ALBUMIN 3.1*   No results for input(s): LIPASE, AMYLASE in the last 168 hours. No results for input(s): AMMONIA in the last 168 hours. Coagulation Profile: Recent Labs  Lab 02/22/19 2115  INR 1.4*   Cardiac Enzymes: No results for input(s): CKTOTAL, CKMB, CKMBINDEX, TROPONINI in the last 168 hours. BNP (last 3 results) No results for input(s): PROBNP in the last 8760 hours. HbA1C: No results for input(s): HGBA1C in the last 72 hours. CBG: Recent Labs  Lab 02/27/19 1628 02/27/19 2143 02/28/19 0731 02/28/19 1118 02/28/19 1601  GLUCAP  112* 143* 109* 201* 116*   Lipid Profile: No results for input(s): CHOL, HDL, LDLCALC, TRIG, CHOLHDL, LDLDIRECT in the last 72 hours. Thyroid Function Tests: No results for input(s): TSH, T4TOTAL, FREET4, T3FREE, THYROIDAB in the last 72 hours. Anemia Panel: No results for input(s): VITAMINB12, FOLATE, FERRITIN, TIBC, IRON, RETICCTPCT in the last 72 hours. Sepsis Labs: Recent Labs  Lab 02/22/19 0117 02/22/19 2045 02/22/19 2046  PROCALCITON  --   --  <0.10  LATICACIDVEN 1.6 2.2*  --     Recent Results (from the past 240 hour(s))  Urine culture     Status: Abnormal   Collection Time: 02/22/19  1:52 AM   Specimen: In/Out Cath Urine  Result Value Ref Range Status   Specimen Description IN/OUT CATH URINE  Final   Special Requests NONE  Final   Culture (A)  Final    <10,000 COLONIES/mL  INSIGNIFICANT GROWTH Performed at [facility], [place] [address] [place], [place] [address]    Report Status 02/24/2019 FINAL  Final  SARS CORONAVIRUS 2 (TAT 6-24 HRS) Nasopharyngeal Nasopharyngeal Swab     Status: None   Collection Time: 02/22/19  8:46 PM   Specimen: Nasopharyngeal Swab  Result Value Ref Range Status   SARS Coronavirus 2 NEGATIVE NEGATIVE Final    Comment: (NOTE) SARS-CoV-2 target nucleic acids are NOT DETECTED. The SARS-CoV-2 RNA is generally detectable in upper and lower respiratory specimens during the acute phase of infection. Negative results do not preclude SARS-CoV-2 infection, do not rule out co-infections with other pathogens, and should not be used as the sole basis for treatment or other patient management decisions. Negative results must be combined with clinical observations, patient history, and epidemiological information. The expected result is Negative. Fact Sheet for Patients: SugarRoll.be Fact Sheet for Healthcare Providers: https://www.woods-mathews.com/ This test is not yet approved or cleared by the [place] FDA and  has been authorized for detection and/or diagnosis of SARS-CoV-2 by FDA under an Emergency Use Authorization (EUA). This EUA will remain  in effect (meaning this test can be used) for the duration of the COVID-19 declaration under Section 56 4(b)(1) of the Act, 21 U.S.C. section 360bbb-3(b)(1), unless the authorization is terminated or revoked sooner. Performed at [facility], [place] [address], [place], [place] [address]   Culture, blood (routine x 2)     Status: None   Collection Time: 02/22/19  9:02 PM   Specimen: BLOOD RIGHT FOREARM  Result Value Ref Range Status   Specimen Description BLOOD RIGHT FOREARM  Final   Special Requests   Final    BOTTLES DRAWN AEROBIC AND ANAEROBIC Blood Culture adequate volume   Culture   Final    NO GROWTH 5 DAYS Performed at [facility], [place] [address], [place], [place] [address]    Report Status 02/27/2019 FINAL  Final  Culture, blood (routine x 2)     Status: None   Collection Time: 02/22/19 10:30 PM   Specimen: BLOOD LEFT WRIST  Result Value Ref Range Status   Specimen Description BLOOD LEFT WRIST  Final   Special Requests   Final    BOTTLES DRAWN AEROBIC AND ANAEROBIC Blood Culture adequate volume   Culture   Final    NO GROWTH 5 DAYS Performed at [facility], [place] [address] [place], [place] [address]    Report Status 02/28/2019 FINAL  Final     Radiology Studies: Dg Chest Port 1 View  Result Date: 02/28/2019 CLINICAL DATA:  CHF. EXAM: PORTABLE CHEST 1 VIEW COMPARISON:  Radiograph 02/25/2019 FINDINGS: Left-sided pacemaker in place. Post median sternotomy and CABG. Cardiomegaly is unchanged. Improving pulmonary edema from prior exam. Probable left pleural effusion which is improved in the interim. No pneumothorax. No progressive airspace disease. IMPRESSION: 1. Improving pulmonary edema. 2. Probable left pleural effusion, improved from prior exam. 3. Stable cardiomegaly. Electronically Signed   By:  Keith Rake M.D.   On: 02/28/2019 05:40    Scheduled Meds: . amoxicillin-clavulanate  1 tablet Oral Q12H  . apixaban  2.5 mg Oral BID  . carbidopa-levodopa  1 tablet Oral TID  . dextromethorphan-guaiFENesin  1 tablet Oral BID  . insulin aspart  0-15 Units Subcutaneous TID WC  . insulin aspart  0-5 Units Subcutaneous QHS  . metoprolol succinate  50 mg Oral QHS  . metoprolol tartrate  100 mg Oral Daily  .  QUEtiapine  12.5 mg Oral QHS   Continuous Infusions:   LOS: 6 days   Marylu Lund, MD [facility] Pager On Amion  If 7PM-7AM, please contact night-coverage 02/28/2019, 5:46 PM

## 2019-02-28 NOTE — Evaluation (Signed)
Occupational Therapy Evaluation Patient Details Name: [name] Powell MRN: [id number] DOB: 10/28/1922 Today's Date: 02/28/2019    History of Present Illness [name] Powell is a 85 y.o. male with medical history significant of HTN, DM2, A.Fib on eliquis, CHB s/p PPM, parkinson's. Patient presents to ED with fever, generalized weakness, decreased appetite, and cough. Lives in ALF with his wife.  Wife reports 2 episodes of coughing up pink-tinged sputum.  Temp 100.9 with EMS, got 1g tylenol. Patient's wife states that he has not been acting himself and being more quiet than normal, although with his Parkinson's and dementia, not very verbal at baseline.   Clinical Impression   Pt with decline in function and safety with ADLs and ADL mobility with impaired strength, balance and endurance. Pt with hx of dementia and parkinson's. Pt's wife present and states that they live at [place] and have aides 12 hours/day 8:30 am - pm to assist with toileting and anything else prn, but mostly for home mgt and to take him to appointments. Pt's wife states that she assists pt with LB bathing and dressing and that she can bathe and dress his UB with set up and that he used a RW for mobility and was le to get to EOB using a rail on bed. Pt currently requires max - mod A with bed mobility, mod a to stand to RW, mod A + 2 for transfers and min A with UB ADLs; continues to require total A with LB ADLs and toileting. Pt would benefit from acute OT services to address impairments to maximize level of function and safety    Follow Up Recommendations  SNF    Equipment Recommendations  Other (comment)(TBD at next venue of care)    Recommendations for Other Services       Precautions / Restrictions Precautions Precautions: Fall      Mobility Bed Mobility Overal bed mobility: Needs Assistance Bed Mobility: Rolling;Sidelying to Sit Rolling: Mod assist Sidelying to sit: Max assist;Mod assist        General bed mobility comments: assit with LEs off EOB and trunk elevation  Transfers Overall transfer level: Needs assistance Equipment used: Rolling walker (2 wheeled) Transfers: Sit to/from [facility] Sit to Stand: Mod assist Stand pivot transfers: Mod assist;+2 physical assistance;+2 safety/equipment            Balance Overall balance assessment: Needs assistance Sitting-balance support: Bilateral upper extremity supported;Feet supported Sitting balance-Leahy Scale: Fair     Standing balance support: Bilateral upper extremity supported;During functional activity Standing balance-Leahy Scale: Poor                             ADL either performed or assessed with clinical judgement   ADL Overall ADL's : Needs assistance/impaired Eating/Feeding: Set up;Sitting;With caregiver independent assisting   Grooming: Wash/dry hands;Wash/dry face;Min guard;Sitting   Upper Body Bathing: Minimal assistance;Sitting   Lower Body Bathing: Total assistance   Upper Body Dressing : Minimal assistance;Sitting   Lower Body Dressing: Total assistance   Toilet Transfer: Moderate assistance;+2 for physical assistance;+2 for safety/equipment;Ambulation;RW;Stand-pivot;BSC;Cueing for safety;Cueing for sequencing   Toileting- Clothing Manipulation and Hygiene: Total assistance;Sit to/from stand       Functional mobility during ADLs: Moderate assistance General ADL Comments: min - min guard A for sitting balance during functional tasks. Pt able to stand at [facility] with RW for pericare after BM     Vision Baseline Vision/History: Wears glasses Wears  Glasses: At all times Patient Visual Report: No change from baseline       Perception     Praxis      Pertinent Vitals/Pain Pain Assessment: No/denies pain     Hand Dominance Right   Extremity/Trunk Assessment Upper Extremity Assessment Upper Extremity Assessment: Generalized weakness   Lower Extremity  Assessment Lower Extremity Assessment: Defer to PT evaluation       Communication Communication Communication: HOH   Cognition Arousal/Alertness: Awake/alert Behavior During Therapy: Flat affect Overall Cognitive Status: History of cognitive impairments - at baseline                                     General Comments       Exercises     Shoulder Instructions      Home Living Family/patient expects to be discharged to:: Private residence([facility]) Living Arrangements: Spouse/significant other Available Help at Discharge: Family;Personal care attendant;Available 24 hours/day Type of Home: Apartment Home Access: Stairs to enter [employer] of Steps: level entry at front, 5-6 steps in back Entrance Stairs-Rails: Right Home Layout: One level     Bathroom Shower/Tub: Occupational psychologist: Standard     Home Equipment: Grab bars - tub/shower;Grab bars - toilet;Shower seat - built in;Shower seat;Walker - 2 wheels;Cane - single point;Wheelchair - [employer] - 4 wheels   Additional Comments: Wife with him at night and 2 aides come in ~12 hours a day      Prior Functioning/Environment Level of Independence: Needs assistance  Gait / Transfers Assistance Needed: Uses the cane mostly inside. Has PT coming in 2x/week. Longer distance uses rollator.  ADL's / Homemaking Assistance Needed: Wife assists with shower (he washes upper body and she does lower body) and dressing. Aides do housework, helps him with bathroom, Dr. visits, walks for exercise.            OT Problem List: Decreased strength;Impaired balance (sitting and/or standing);Decreased cognition;Impaired tone;Decreased safety awareness;Decreased activity tolerance;Decreased knowledge of use of DME or AE;Decreased coordination      OT Treatment/Interventions: Self-care/ADL training;DME and/or AE instruction;Therapeutic activities;Patient/family education    OT  Goals(Current goals can be found in the care plan section) Acute Rehab OT Goals Patient Stated Goal: get stronger, go home per wife OT Goal Formulation: With patient/family Time For Goal Achievement: 03/14/19 Potential to Achieve Goals: Good ADL Goals Pt Will Perform Grooming: with supervision;with set-up;sitting;with caregiver independent in assisting Pt Will Perform Upper Body Bathing: with min guard assist;sitting;with caregiver independent in assisting Pt Will Perform Upper Body Dressing: with min guard assist;sitting;with caregiver independent in assisting Pt Will Transfer to Toilet: with mod assist;with min assist;bedside commode Additional ADL Goal #1: Pt will complete bed mobility with min A to sit EOB for functional tasks  OT Frequency: Min 2X/week   Barriers to D/C: Decreased caregiver support          Co-evaluation PT/OT/SLP Co-Evaluation/Treatment: Yes Reason for Co-Treatment: For patient/therapist safety;To address functional/ADL transfers   OT goals addressed during session: ADL's and self-care;Proper use of Adaptive equipment and DME      AM-PAC OT "6 Clicks" Daily Activity     Outcome Measure Help from another person eating meals?: A Little Help from another person taking care of personal grooming?: A Little Help from another person toileting, which includes using toliet, bedpan, or urinal?: Total Help from another person bathing (including washing, rinsing, drying)?: Total  Help from another person to put on and taking off regular upper body clothing?: A Little Help from another person to put on and taking off regular lower body clothing?: Total 6 Click Score: 12   End of Session Equipment Utilized During Treatment: Gait belt;Rolling walker;Other (comment)(BSC)  Activity Tolerance: Patient tolerated treatment well Patient left: in chair;with call bell/phone within reach;with chair alarm set  OT Visit Diagnosis: Unsteadiness on feet (R26.81);Other abnormalities  of gait and mobility (R26.89);Muscle weakness (generalized) (M62.81);Other symptoms and signs involving cognitive function;Other symptoms and signs involving the nervous system (R29.898)                Time: 2162-4469 OT Time Calculation (min): 41 min Charges:  OT General Charges $OT Visit: 1 Visit OT Evaluation $OT Eval Moderate Complexity: 1 Mod    Britt Bottom 02/28/2019, 2:09 PM

## 2019-03-01 LAB — BASIC METABOLIC PANEL
Anion gap: 12 (ref 5–15)
BUN: 55 mg/dL — ABNORMAL HIGH (ref 8–23)
CO2: 31 mmol/L (ref 22–32)
Calcium: 8.8 mg/dL — ABNORMAL LOW (ref 8.9–10.3)
Chloride: 93 mmol/L — ABNORMAL LOW (ref 98–111)
Creatinine, Ser: 2.15 mg/dL — ABNORMAL HIGH (ref 0.61–1.24)
GFR calc Af Amer: 29 mL/min — ABNORMAL LOW (ref >60)
GFR calc non Af Amer: 25 mL/min — ABNORMAL LOW (ref >60)
Glucose, Bld: 154 mg/dL — ABNORMAL HIGH (ref 70–99)
Potassium: 3.4 mmol/L — ABNORMAL LOW (ref 3.5–5.1)
Sodium: 136 mmol/L (ref 135–145)

## 2019-03-01 LAB — CBC
HCT: 27.9 % — ABNORMAL LOW (ref 39.0–52.0)
Hemoglobin: 9 g/dL — ABNORMAL LOW (ref 13.0–17.0)
MCH: 34.4 pg — ABNORMAL HIGH (ref 26.0–34.0)
MCHC: 32.3 g/dL (ref 30.0–36.0)
MCV: 106.5 fL — ABNORMAL HIGH (ref 80.0–100.0)
Platelets: 232 10*3/uL (ref 150–400)
RBC: 2.62 MIL/uL — ABNORMAL LOW (ref 4.22–5.81)
RDW: 15.3 % (ref 11.5–15.5)
WBC: 7.6 10*3/uL (ref 4.0–10.5)
nRBC: 0 % (ref 0.0–0.2)

## 2019-03-01 LAB — GLUCOSE, CAPILLARY
Glucose-Capillary: 120 mg/dL — ABNORMAL HIGH (ref 70–99)
Glucose-Capillary: 174 mg/dL — ABNORMAL HIGH (ref 70–99)

## 2019-03-01 MED ORDER — POTASSIUM CHLORIDE CRYS ER 20 MEQ PO TBCR
40.0000 meq | EXTENDED_RELEASE_TABLET | Freq: Once | ORAL | Status: AC
  Administered 2019-03-01: 40 meq via ORAL
  Filled 2019-03-01: qty 2

## 2019-03-01 MED ORDER — QUETIAPINE FUMARATE 25 MG PO TABS: 12.5000 mg | ORAL_TABLET | Freq: Every day | ORAL | 0 refills | Status: DC

## 2019-03-01 MED ORDER — METOPROLOL TARTRATE 50 MG PO TABS: 50.0000 mg | ORAL_TABLET | Freq: Every day | ORAL | Status: DC

## 2019-03-01 NOTE — Discharge Summary (Addendum)
Physician Discharge Summary  [name] Powell FXJ:[id number] DOB: [birth year] DOA: 02/22/2019  PCP: [name], MD  Admit date: 02/22/2019 Discharge date: 03/01/2019  Admitted From: ILF Disposition:  ILF  Recommendations for Outpatient Follow-up:  1. Follow up with PCP in 1-2 weeks 2. Would resume 40mg  PO lasix on 10/24  Discharge Condition:Improved CODE STATUS:Full Diet recommendation: Diabetic   Brief/Interim Summary: 85 y.o.malewith medical history significant ofHTN, DM2, A.Fib on eliquis, CHB s/p PPM, parkinson's. Patient presents to ED with fever, generalized weakness, decreased appetite, and cough. Lives in ALF with his wife. Wife reports 2 episodes of coughing up pink-tinged sputum. Temp 100.9 with EMS, got 1g tylenol. Patient's wife states that he has not been acting himself and being more quiet than normal, although with his Parkinson's and dementia,he doesn't say too much at baseline.Pt cant provide much history  Discharge Diagnoses:  Principal Problem:   Community acquired pneumonia Active Problems:   DM2 (diabetes mellitus, type 2) ([place])   HTN (hypertension)   Chronic diastolic congestive heart failure (HCC)   Atrial fibrillation (HCC)   CKD (chronic kidney disease), stage III ([place])   Parkinson's disease ([place])   Acute encephalopathy   CAP (community acquired pneumonia)  1. CAP - will treat bibasilar infiltrates as CAP for the moment 1. Cont PNA pathway, continues to improve 2. Had been on empiric rocephin / azithromycininitiated on admission, completed course with augmentin 3. COVIDnegative 4. Influenza PCRneg 5. BCxNTD 6. Remains clinically stable 2. Acute on Chronic diastolic CHF - 1. CXR - showedworsening edema 10/18-recheck in AM 2. BNPnoted to be elevated 3. Pt was on diuretics with good response, lasix was on hold given ARF 4. Renal function improved 3. Acute on CKD stage 3-4 1. Got 500cc bolus in ED, 2. As above, c/w  strictIntake and output. 3. D/cIV diuresis, cont poand follow daily weights. 4. Cr has peaked to 2.34 on 10/21, now improving 5. Repeat bmet in AM 4. Likely mildhospital-acquireddelirium on top of chronic parkinson's dementia 1 received haldol x 1 this admit.startedlow-dose Seroquel. 10/19 2 Currently stable 6. Parkinson's disease - 1. Continue sinemetas per home regimen. 2. Remains stable at this time 7. DM2 - 1. Holding PO hypoglycemicswhile inpatient. 2. Continue mod scale SSI AC/HS while in hospital 3. Presently stable 8. HTN - 1. Overall stable 2. Continue metoprolol 3. Lasix was on hold given rising Cr 9. A.Fib - 1. Continue metoprolol and eliquis 2. Remains rate controlled 3. Remains stable  Patient suffers from parkinson's which impairs their ability to perform daily activities like dressing in the home.  A cane will not resolve issue with performing activities of daily living. A wheelchair will allow patient to safely perform daily activities. Patient can safely propel the wheelchair in the home or has a caregiver who can provide assistance. Length of need Lifetime. Accessories: elevating leg rests (ELRs), wheel locks, extensions and anti-tippers.  Discharge Instructions   Allergies as of 03/01/2019      Reactions   Milk-related Compounds Cough   Lanoxin [digoxin] Rash   Eyelid rash   Mexitil [mexiletine] Other (See Comments)   unknown      Medication List    STOP taking these medications   furosemide 40 MG tablet Commonly known as: LASIX   metFORMIN 500 MG 24 hr tablet Commonly known as: GLUCOPHAGE-XR   potassium chloride 10 MEQ tablet Commonly known as: KLOR-CON     TAKE these medications   atorvastatin 20 MG tablet Commonly known as: LIPITOR Take 20 mg  by mouth daily.   carbidopa-levodopa 25-100 MG tablet Commonly known as: SINEMET IR Take 1 tablet by mouth 3 (three) times daily.   Eliquis 2.5 MG Tabs tablet Generic  drug: apixaban TAKE 1 TABLET TWICE DAILY What changed: how much to take   glimepiride 4 MG tablet Commonly known as: AMARYL Take 4 mg by mouth daily before breakfast.   Januvia 50 MG tablet Generic drug: sitaGLIPtin Take 1 tablet by mouth every evening.   metoprolol tartrate 50 MG tablet Commonly known as: LOPRESSOR TAKE 2 TABLETS IN THE MORNING  AND TAKE 1 TABLET IN THE EVENING What changed: See the new instructions.   polyethylene glycol 17 g packet Commonly known as: MIRALAX / GLYCOLAX Take 17 g by mouth daily. To prevent constipation   PreserVision AREDS Caps Take 1 capsule by mouth 2 (two) times daily.   QUEtiapine 25 MG tablet Commonly known as: SEROQUEL Take 0.5 tablets (12.5 mg total) by mouth at bedtime.   vitamin B-12 1000 MCG tablet Commonly known as: CYANOCOBALAMIN Take 1,000 mcg by mouth daily.   VITAMIN D PO Take 10,000 Units by mouth every Friday. On Friday      Follow-up Information    [facility] Follow up.   Specialty: [place] Why: Continue with PT Contact information: [address] [place] [address] [phone]          Allergies  Allergen Reactions  . Milk-Related Compounds Cough  . Lanoxin [Digoxin] Rash    Eyelid rash  . Mexitil [Mexiletine] Other (See Comments)    unknown     Procedures/Studies: Dg Chest Port 1 View  Result Date: 02/28/2019 CLINICAL DATA:  CHF. EXAM: PORTABLE CHEST 1 VIEW COMPARISON:  Radiograph 02/25/2019 FINDINGS: Left-sided pacemaker in place. Post median sternotomy and CABG. Cardiomegaly is unchanged. Improving pulmonary edema from prior exam. Probable left pleural effusion which is improved in the interim. No pneumothorax. No progressive airspace disease. IMPRESSION: 1. Improving pulmonary edema. 2. Probable left pleural effusion, improved from prior exam. 3. Stable cardiomegaly. Electronically Signed   By: Keith Rake M.D.   On: 02/28/2019 05:40   Dg Chest Port  1 View  Result Date: 02/25/2019 CLINICAL DATA:  Pneumonia EXAM: PORTABLE CHEST 1 VIEW COMPARISON:  02/23/2019; 02/22/2019; chest CT-12/09/2006 FINDINGS: Grossly unchanged cardiac silhouette and mediastinal contours post median sternotomy and CABG. Stable position of support apparatus. Pulmonary vasculature is less distinct on the present examination now with suspected small to moderate-sized layering effusion and worsening left mid and lower lung predominant heterogeneous/consolidative opacities. Right infrahilar heterogeneous opacities are unchanged. No pneumothorax. No acute osseous abnormalities. IMPRESSION: Findings most suggestive of worsening pulmonary edema, now with small to moderate-sized layering left-sided effusion and worsening bilateral opacities, left greater than right, likely atelectasis though underlying infection not excluded. Electronically Signed   By: Sandi Mariscal M.D.   On: 02/25/2019 06:32   Dg Chest Port 1 View  Result Date: 02/23/2019 CLINICAL DATA:  Fever and weakness EXAM: PORTABLE CHEST 1 VIEW COMPARISON:  February 22, 2019 FINDINGS: There is persistent airspace consolidation in the left lower lobe with small left pleural effusion. There is mild right base atelectasis. Heart is mildly enlarged with pulmonary vascularity within normal limits. Pacemaker leads are attached to the right atrium and right ventricle. Patient is status post coronary artery bypass grafting. There is aortic atherosclerosis. No adenopathy. No bone lesions. IMPRESSION: 1. Persistent left lower lobe pneumonia with small left pleural effusion. 2. Mild right base atelectasis. 3.  Aortic Atherosclerosis (ICD10-I70.0). 4. Stable cardiac prominence. Status post coronary artery bypass grafting. Pacemaker leads attached to right atrium and right ventricle. Electronically Signed   By: Lowella Grip III M.D.   On: 02/23/2019 14:20   Dg Chest Portable 1 View  Result Date: 02/22/2019 CLINICAL DATA:  Fever and  cough EXAM: PORTABLE CHEST 1 VIEW COMPARISON:  10/23/2018 FINDINGS: Post sternotomy changes. Left-sided pacing device as before. Small left-sided pleural effusion. Patchy airspace disease at the bases. Enlarged cardiomediastinal silhouette with central vascular congestion. Aortic atherosclerosis. No pneumothorax. IMPRESSION: 1. Patchy basilar airspace disease which may reflect atelectasis or minimal infiltrates 2. Cardiomegaly with slight central congestion. Possible small left effusion Electronically Signed   By: Donavan Foil M.D.   On: 02/22/2019 21:32    Subjective: Without complaints this AM  Discharge Exam: Vitals:   03/01/19 0615 03/01/19 1100  BP: (!) 156/74 (!) 146/75  Pulse: 64 (!) 56  Resp: 14 18  Temp: 98.2 F (36.8 C) 98.5 F (36.9 C)  SpO2: 100% 100%   Vitals:   02/28/19 1603 02/28/19 2129 03/01/19 0615 03/01/19 1100  BP: 123/62 (!) 162/65 (!) 156/74 (!) 146/75  Pulse: (!) 59 60 64 (!) 56  Resp: 18 16 14 18   Temp: 97.7 F (36.5 C) 97.7 F (36.5 C) 98.2 F (36.8 C) 98.5 F (36.9 C)  TempSrc: Oral Oral Oral Oral  SpO2: 93% (!) 86% 100% 100%  Weight:      Height:        General: Pt is alert, awake, not in acute distress Cardiovascular: RRR, S1/S2 +, no rubs, no gallops Respiratory: CTA bilaterally, no wheezing, no rhonchi Abdominal: Soft, NT, ND, bowel sounds + Extremities: no edema, no cyanosis   The results of significant diagnostics from this hospitalization (including imaging, microbiology, ancillary and laboratory) are listed below for reference.     Microbiology: Recent Results (from the past 240 hour(s))  Urine culture     Status: Abnormal   Collection Time: 02/22/19  1:52 AM   Specimen: In/Out Cath Urine  Result Value Ref Range Status   Specimen Description IN/OUT CATH URINE  Final   Special Requests NONE  Final   Culture (A)  Final    <10,000 COLONIES/mL INSIGNIFICANT GROWTH Performed at [facility], [place] [address] [place], [place]  [address]    Report Status 02/24/2019 FINAL  Final  SARS CORONAVIRUS 2 (TAT 6-24 HRS) Nasopharyngeal Nasopharyngeal Swab     Status: None   Collection Time: 02/22/19  8:46 PM   Specimen: Nasopharyngeal Swab  Result Value Ref Range Status   SARS Coronavirus 2 NEGATIVE NEGATIVE Final    Comment: (NOTE) SARS-CoV-2 target nucleic acids are NOT DETECTED. The SARS-CoV-2 RNA is generally detectable in upper and lower respiratory specimens during the acute phase of infection. Negative results do not preclude SARS-CoV-2 infection, do not rule out co-infections with other pathogens, and should not be used as the sole basis for treatment or other patient management decisions. Negative results must be combined with clinical observations, patient history, and epidemiological information. The expected result is Negative. Fact Sheet for Patients: SugarRoll.be Fact Sheet for Healthcare Providers: https://www.woods-mathews.com/ This test is not yet approved or cleared by the [place] FDA and  has been authorized for detection and/or diagnosis of SARS-CoV-2 by FDA under an Emergency Use Authorization (EUA). This EUA will remain  in effect (meaning this test can be used) for the duration of the COVID-19 declaration under Section 56 4(b)(1) of the  Act, 21 U.S.C. section 360bbb-3(b)(1), unless the authorization is terminated or revoked sooner. Performed at [facility], [place] [address], [place], [place] [address]   Culture, blood (routine x 2)     Status: None   Collection Time: 02/22/19  9:02 PM   Specimen: BLOOD RIGHT FOREARM  Result Value Ref Range Status   Specimen Description BLOOD RIGHT FOREARM  Final   Special Requests   Final    BOTTLES DRAWN AEROBIC AND ANAEROBIC Blood Culture adequate volume   Culture   Final    NO GROWTH 5 DAYS Performed at [facility], [place] [address], [place], [place] [address]    Report Status 02/27/2019 FINAL   Final  Culture, blood (routine x 2)     Status: None   Collection Time: 02/22/19 10:30 PM   Specimen: BLOOD LEFT WRIST  Result Value Ref Range Status   Specimen Description BLOOD LEFT WRIST  Final   Special Requests   Final    BOTTLES DRAWN AEROBIC AND ANAEROBIC Blood Culture adequate volume   Culture   Final    NO GROWTH 5 DAYS Performed at [facility], [place] [address], [place], [place] [address]    Report Status 02/28/2019 FINAL  Final     Labs: BNP (last 3 results) Recent Labs    10/23/18 2118 02/22/19 2348  BNP 906.8* 631.4*   Basic Metabolic Panel: Recent Labs  Lab 02/24/19 0437 02/25/19 0351 02/27/19 0617 02/28/19 0531 03/01/19 0338  NA 137 140 141 140 136  K 4.4 3.9 3.3* 3.3* 3.4*  CL 104 104 97* 96* 93*  CO2 25 26 31  32 31  GLUCOSE 175* 138* 88 103* 154*  BUN 37* 40* 51* 53* 55*  CREATININE 1.75* 1.73* 2.31* 2.34* 2.15*  CALCIUM 8.5* 8.2* 9.2 9.0 8.8*   Liver Function Tests: Recent Labs  Lab 02/22/19 2046  AST 14*  ALT 7  ALKPHOS 102  BILITOT 0.8  PROT 6.7  ALBUMIN 3.1*   No results for input(s): LIPASE, AMYLASE in the last 168 hours. No results for input(s): AMMONIA in the last 168 hours. CBC: Recent Labs  Lab 02/22/19 2046  02/24/19 0437 02/25/19 0351 02/27/19 0617 02/28/19 0531 03/01/19 0338  WBC 12.2*   < > 9.8 8.0 5.3 5.2 7.6  NEUTROABS 7.6  --  5.6 4.0  --  2.4  --   HGB 9.1*   < > 8.5* 8.1* 8.7* 9.0* 9.0*  HCT 29.1*   < > 27.4* 24.9* 28.0* 27.7* 27.9*  MCV 110.2*   < > 109.6* 106.9* 108.9* 105.3* 106.5*  PLT 136*   < > 131* 129* 180 190 232   < > = values in this interval not displayed.   Cardiac Enzymes: No results for input(s): CKTOTAL, CKMB, CKMBINDEX, TROPONINI in the last 168 hours. BNP: Invalid input(s): POCBNP CBG: Recent Labs  Lab 02/28/19 1118 02/28/19 1601 02/28/19 2127 03/01/19 0616 03/01/19 1120  GLUCAP 201* 116* 146* 120* 174*   D-Dimer No results for input(s): DDIMER in the last 72 hours. Hgb  A1c No results for input(s): HGBA1C in the last 72 hours. Lipid Profile No results for input(s): CHOL, HDL, LDLCALC, TRIG, CHOLHDL, LDLDIRECT in the last 72 hours. Thyroid function studies No results for input(s): TSH, T4TOTAL, T3FREE, THYROIDAB in the last 72 hours.  Invalid input(s): FREET3 Anemia work up No results for input(s): VITAMINB12, FOLATE, FERRITIN, TIBC, IRON, RETICCTPCT in the last 72 hours. Urinalysis    Component Value  Date/Time   COLORURINE YELLOW 02/23/2019 0118   APPEARANCEUR CLEAR 02/23/2019 0118   LABSPEC 1.015 02/23/2019 0118   PHURINE 5.0 02/23/2019 0118   GLUCOSEU 50 (A) 02/23/2019 0118   HGBUR NEGATIVE 02/23/2019 0118   BILIRUBINUR NEGATIVE 02/23/2019 0118   KETONESUR NEGATIVE 02/23/2019 0118   PROTEINUR 30 (A) 02/23/2019 0118   UROBILINOGEN 1.0 02/19/2015 2354   NITRITE NEGATIVE 02/23/2019 0118   LEUKOCYTESUR NEGATIVE 02/23/2019 0118   Sepsis Labs Invalid input(s): PROCALCITONIN,  WBC,  LACTICIDVEN Microbiology Recent Results (from the past 240 hour(s))  Urine culture     Status: Abnormal   Collection Time: 02/22/19  1:52 AM   Specimen: In/Out Cath Urine  Result Value Ref Range Status   Specimen Description IN/OUT CATH URINE  Final   Special Requests NONE  Final   Culture (A)  Final    <10,000 COLONIES/mL INSIGNIFICANT GROWTH Performed at [facility], [address] [place], [place] [address]    Report Status 02/24/2019 FINAL  Final  SARS CORONAVIRUS 2 (TAT 6-24 HRS) Nasopharyngeal Nasopharyngeal Swab     Status: None   Collection Time: 02/22/19  8:46 PM   Specimen: Nasopharyngeal Swab  Result Value Ref Range Status   SARS Coronavirus 2 NEGATIVE NEGATIVE Final    Comment: (NOTE) SARS-CoV-2 target nucleic acids are NOT DETECTED. The SARS-CoV-2 RNA is generally detectable in upper and lower respiratory specimens during the acute phase of infection. Negative results do not preclude SARS-CoV-2 infection, do not rule out co-infections  with other pathogens, and should not be used as the sole basis for treatment or other patient management decisions. Negative results must be combined with clinical observations, patient history, and epidemiological information. The expected result is Negative. Fact Sheet for Patients: SugarRoll.be Fact Sheet for Healthcare Providers: https://www.woods-mathews.com/ This test is not yet approved or cleared by the [place] FDA and  has been authorized for detection and/or diagnosis of SARS-CoV-2 by FDA under an Emergency Use Authorization (EUA). This EUA will remain  in effect (meaning this test can be used) for the duration of the COVID-19 declaration under Section 56 4(b)(1) of the Act, 21 U.S.C. section 360bbb-3(b)(1), unless the authorization is terminated or revoked sooner. Performed at [facility], [place] [address] [place], [place] [address]   Culture, blood (routine x 2)     Status: None   Collection Time: 02/22/19  9:02 PM   Specimen: BLOOD RIGHT FOREARM  Result Value Ref Range Status   Specimen Description BLOOD RIGHT FOREARM  Final   Special Requests   Final    BOTTLES DRAWN AEROBIC AND ANAEROBIC Blood Culture adequate volume   Culture   Final    NO GROWTH 5 DAYS Performed at [facility], [place] [address], [place], [place] [address]    Report Status 02/27/2019 FINAL  Final  Culture, blood (routine x 2)     Status: None   Collection Time: 02/22/19 10:30 PM   Specimen: BLOOD LEFT WRIST  Result Value Ref Range Status   Specimen Description BLOOD LEFT WRIST  Final   Special Requests   Final    BOTTLES DRAWN AEROBIC AND ANAEROBIC Blood Culture adequate volume   Culture   Final    NO GROWTH 5 DAYS Performed at [facility], [place] [address] [place], [place] [address]    Report Status 02/28/2019 FINAL  Final   Time spent: 28min  SIGNED:   Marylu Lund, MD  [facility] 03/01/2019, 3:10 PM  If  7PM-7AM, please contact night-coverage

## 2019-03-01 NOTE — NC FL2 (Signed)
[place] LEVEL OF CARE SCREENING TOOL     IDENTIFICATION  Patient Name: [name] Powell Birthdate: 09-14-1922 Sex: male Admission Date (Current Location): 02/22/2019  [facility] and [place] Number:  Herbalist and Address:  [facility] [place], [place] [address] [place], [place] [address]      Provider Number: [id number]  Attending Physician Name and Address:  Donne Hazel, MD  Relative Name and Phone Number:       Current Level of Care: Hospital Recommended Level of Care: [place] Prior Approval Number:    Date Approved/Denied:   PASRR Number: [id number]  Discharge Plan: SNF    Current Diagnoses: Patient Active Problem List   Diagnosis Date Noted  . CAP (community acquired pneumonia) 02/23/2019  . Parkinson's disease ([place]) 02/22/2019  . Acute encephalopathy 02/22/2019  . Symptomatic anemia 10/23/2018  . Pacemaker battery depletion 02/02/2017  . CHF (congestive heart failure) ([place]) 07/27/2016  . Weakness generalized 07/27/2016  . Generalized weakness 07/27/2016  . Long term current use of anticoagulant 01/16/2016  . Acute respiratory failure with hypoxia ([place]) 09/15/2015  . Acute on chronic diastolic congestive heart failure ([place]) 09/15/2015  . CKD (chronic kidney disease), stage III ([place]) 09/15/2015  . Atrial fibrillation ([place])   . Pleural effusion, left   . Acute diastolic (congestive) heart failure ([place])   . Community acquired pneumonia 02/19/2015  . Atrial flutter ([place]) 01/20/2015  . New onset atrial flutter, persistent 07/04/2014  . Ventricular tachycardia (paroxysmal) ([place]) 03/28/2014  . Chronic diastolic congestive heart failure ([place]) 01/02/2014  . Pacemaker dependent - Medtronic 01/10/2013  . CAD s/p CABG 2003 01/10/2013  . Sinus arrest 01/10/2013  . CHB (complete heart block) ([place]) 01/10/2013  . DM2 (diabetes mellitus, type 2) ([place]) 01/10/2013  . Dyslipidemia 01/10/2013  . HTN (hypertension)  01/10/2013    Orientation RESPIRATION BLADDER Height & Weight     Self  O2(Nasal Cannula 2L) External catheter, Incontinent(placed 10/17) Weight: 180 lb (81.6 kg) Height:  5\' 8"  (172.7 cm)  BEHAVIORAL SYMPTOMS/MOOD NEUROLOGICAL BOWEL NUTRITION STATUS      Incontinent Diet(carb modified)  AMBULATORY STATUS COMMUNICATION OF NEEDS Skin   Extensive Assist Verbally Normal                       Personal Care Assistance Level of Assistance  Bathing, Feeding, Dressing Bathing Assistance: Maximum assistance Feeding assistance: Limited assistance Dressing Assistance: Maximum assistance     Functional Limitations Info  Sight, Hearing, Speech Sight Info: Adequate Hearing Info: Adequate Speech Info: Adequate    SPECIAL CARE FACTORS FREQUENCY  PT (By licensed PT), OT (By licensed OT)     PT Frequency: 5x OT Frequency: 5x            Contractures Contractures Info: Not present    Additional Factors Info  Code Status, Allergies Code Status Info: Full Code Allergies Info: Milk-related Compounds, Lanoxin (Digoxin), Mexitil (Mexiletine)           Current Medications (03/01/2019):  This is the current hospital active medication list Current Facility-Administered Medications  Medication Dose Route Frequency Provider Last Rate Last Dose  . acetaminophen (TYLENOL) tablet 650 mg  650 mg Oral Q6H PRN Etta Quill, DO       Or  . acetaminophen (TYLENOL) suppository 650 mg  650 mg Rectal Q6H PRN Etta Quill, DO      . amoxicillin-clavulanate (AUGMENTIN) 250-125 MG per tablet 1 tablet  1  tablet Oral Q12H Barton Dubois, MD   1 tablet at 03/01/19 [phone]  . apixaban (ELIQUIS) tablet 2.5 mg  2.5 mg Oral BID Jennette Kettle M, DO   2.5 mg at 03/01/19 1224  . carbidopa-levodopa (SINEMET IR) 25-100 MG per tablet immediate release 1 tablet  1 tablet Oral TID Etta Quill, DO   1 tablet at 03/01/19 0855  . dextromethorphan-guaiFENesin (MUCINEX DM) 30-600 MG per 12 hr tablet 1  tablet  1 tablet Oral BID Marcell Anger, MD   1 tablet at 03/01/19 0854  . insulin aspart (novoLOG) injection 0-15 Units  0-15 Units Subcutaneous TID WC Etta Quill, DO   5 Units at 02/28/19 1328  . insulin aspart (novoLOG) injection 0-5 Units  0-5 Units Subcutaneous QHS Etta Quill, DO   Stopped at 02/23/19 0109  . metoprolol tartrate (LOPRESSOR) tablet 100 mg  100 mg Oral Daily Jennette Kettle M, DO   100 mg at 03/01/19 0855  . metoprolol tartrate (LOPRESSOR) tablet 50 mg  50 mg Oral QHS Donne Hazel, MD      . ondansetron [facility] [facility] tablet 4 mg  4 mg Oral Q6H PRN Etta Quill, DO       Or  . ondansetron [facility]) injection 4 mg  4 mg Intravenous Q6H PRN Etta Quill, DO      . QUEtiapine (SEROQUEL) tablet 12.5 mg  12.5 mg Oral QHS Barton Dubois, MD   12.5 mg at 02/28/19 2229     Discharge Medications: Please see discharge summary for a list of discharge medications.  Relevant Imaging Results:  Relevant Lab Results:   Additional Information SSN:[id number]  Gerrianne Scale Glennice Marcos, LCSW

## 2019-03-01 NOTE — Consult Note (Signed)
   [facility] Inpatient Consult   03/01/2019  YADER CRIGER 03/17/1923 [id number]    Follow-up Note:  Patient is being followed for dispositionand needsfor [facility] managementservices for community follow-up as a benefit ofhis Hydrographic surveyor.  Noted change in disposition to home with home health services instead of previous recommendation for skilled nursing facility per PT/OT recommendation.  Transition of care SW note shows that patient is a resident at [facility].  Patient's daughter thinks that he would be better off to return to ILF, that they have [facility] through [facility] and they have a "sitter" who stays all day  with patient, through [facility] (also getting someone through the night to be there). Daughter doesn't believe that patient will benefit from SNF due to his Parkinson's and would do better in a familiar location. Patient's daughter also concerned about COVID. Patient was discharged to home with home health PT ([facility]).   Patient had transitionedto home ([facility]) prior to speaking with him.  Plan: Will follow patient with General EMMI calls to monitor recovery.  Of note, [facility] Care Management services does not replace or interfere with any services that are arranged by transition of care case management or social work.    For questions and referral, please call:  Edwena Felty A. Adreena Willits, BSN, RN-BC [facility] Liaison Cell: [phone]

## 2019-03-01 NOTE — Progress Notes (Signed)
[name] Powell to be discharged Home per MD order. Discussed prescriptions and follow up appointments with the patient. Prescriptions given to patient; medication list explained in detail. Patient verbalized understanding.  Skin clean, dry and intact without evidence of skin break down, no evidence of skin tears noted. IV catheter discontinued intact. Site without signs and symptoms of complications. Dressing and pressure applied. Pt denies pain at the site currently. No complaints noted.  Patient free of lines, drains, and wounds.   An After Visit Summary (AVS) was printed and given to the patient. Patient escorted via wheelchair, and discharged home via private auto.  Shela Commons, RN

## 2019-03-01 NOTE — TOC Transition Note (Signed)
Transition of Care [facility]) - CM/SW Discharge Note   Patient Details  Name: MASAHIRO IGLESIA MRN: [id number] Date of Birth: [birth year]  Transition of Care [facility]) CM/SW Contact:  Eileen Stanford, LCSW Phone Number: 03/01/2019, 3:23 PM   Clinical Narrative:   Pt stable for d/c from CSW standpoint. Wheelchair will be delievered to pt's home tomorrow. Pt will resume HH with [facility]. Pt's spouse aware. Pt's spouse and pt's son in law will transport pt home.      Barriers to Discharge: Continued Medical Work up   Patient Goals and CMS Choice Patient states their goals for this hospitalization and ongoing recovery are:: "to get dad back to his familar location"      Discharge Placement                Patient to be transferred to facility by: Spouse car Name of family member notified: Pt's spouse Inez Catalina aware of transport Patient and family notified of of transfer: 03/01/19  Discharge Plan and Services In-house Referral: NA   Post Acute Care Choice: Home Health          DME Arranged: Wheelchair manual DME Agency: [facility]) Date DME Agency Contacted: 03/01/19 Time DME Agency Contacted: 37   Cotati: PT Enlow: [facility] Date HH Agency Contacted: 03/01/19 Time HH Agency Contacted: 28    Social Determinants of Health (SDOH) Interventions     Readmission Risk Interventions No flowsheet data found.

## 2019-03-01 NOTE — TOC Initial Note (Addendum)
Transition of Care [facility]) - Initial/Assessment Note    Patient Details  Name: [name] Powell MRN: [id number] Date of Birth: [birth year]  Transition of Care [facility]) CM/SW Contact:    Eileen Stanford, LCSW Phone Number: 03/01/2019, 2:35 PM  Clinical Narrative:  Pt is a resident at [facility].  CSW spoke with pt's wife, present at bedside. CSW explained recommendation for SNF however, pt's spouse ask that CSW please contact pt's daughter, Nevin Bloodgood at [phone].         CSW spoke with Nevin Bloodgood and she thinks that pt would be better off to return to ILF. Nevin Bloodgood states they have [name] through [facility] and they have a Geophysical data processor" who stays all day, through [employer]. Pt's daughter states she doesn't believe pt will benefit from SNF due to his Parkinson's. Nevin Bloodgood states he does better in a familiar location. Pt's daughter is also concerned about COVID. Pt's daughter has been in a SNF before and she states "they almost killed me." Pt's daughter believes pt would be better off returning to ILF with [facility] and sitter that he has now. Pt's daughter is going to call pt's spouse and discuss with her.       CSW has spoken with pt's spouse and she states she is agreeable to pt returning back to ILF with her with the help they already have established there. Pt's spouse states they are also getting someone through the night to be there. Pt's spouse is requesting a wheelchair. Pt's spouse/son in law will transport pt home.  Expected Discharge Plan: [place] Barriers to Discharge: Continued Medical Work up   Patient Goals and CMS Choice Patient states their goals for this hospitalization and ongoing recovery are:: "to get dad back to his familar location"      Expected Discharge Plan and Services Expected Discharge Plan: [place] In-house Referral: NA   Post Acute Care Choice: [place] arrangements for the past 2 months: Independent Living  Facility([facility])                           HH Arranged: PT HH Agency: [facility] Date HH Agency Contacted: 03/01/19 Time HH Agency Contacted: [phone]    Prior Living Arrangements/Services Living arrangements for the past 2 months: Independent Living Facility([facility]) Lives with:: Spouse Patient language and need for interpreter reviewed:: Yes Do you feel safe going back to the place where you live?: Yes      Need for Family Participation in Patient Care: Yes (Comment) Care giver support system in place?: Yes (comment)   Criminal Activity/Legal Involvement Pertinent to Current Situation/Hospitalization: No - Comment as needed  Activities of Daily Living      Permission Sought/Granted Permission sought to share information with : Family Supports    Share Information with NAME: Inez Catalina  Permission granted to share info w AGENCY: [facility] granted to share info w Relationship: Spouse  Permission granted to share info w Contact Information: [phone]  Emotional Assessment Appearance:: Appears stated age Attitude/Demeanor/Rapport: Unable to Assess Affect (typically observed): Unable to Assess Orientation: : Oriented to Self Alcohol / Substance Use: Not Applicable Psych Involvement: No (comment)  Admission diagnosis:  Pneumonia [J18.9] Community acquired pneumonia, unspecified laterality [J18.9] CAP (community acquired pneumonia) [J18.9] Patient Active Problem List   Diagnosis Date Noted  . CAP (community acquired pneumonia) 02/23/2019  . Parkinson's disease ([place]) 02/22/2019  . Acute encephalopathy 02/22/2019  .  Symptomatic anemia 10/23/2018  . Pacemaker battery depletion 02/02/2017  . CHF (congestive heart failure) ([place]) 07/27/2016  . Weakness generalized 07/27/2016  . Generalized weakness 07/27/2016  . Long term current use of anticoagulant 01/16/2016  . Acute respiratory failure with hypoxia ([place]) 09/15/2015  . Acute on chronic  diastolic congestive heart failure ([place]) 09/15/2015  . CKD (chronic kidney disease), stage III ([place]) 09/15/2015  . Atrial fibrillation ([place])   . Pleural effusion, left   . Acute diastolic (congestive) heart failure ([place])   . Community acquired pneumonia 02/19/2015  . Atrial flutter ([place]) 01/20/2015  . New onset atrial flutter, persistent 07/04/2014  . Ventricular tachycardia (paroxysmal) ([place]) 03/28/2014  . Chronic diastolic congestive heart failure ([place]) 01/02/2014  . Pacemaker dependent - Medtronic 01/10/2013  . CAD s/p CABG 2003 01/10/2013  . Sinus arrest 01/10/2013  . CHB (complete heart block) ([place]) 01/10/2013  . DM2 (diabetes mellitus, type 2) ([place]) 01/10/2013  . Dyslipidemia 01/10/2013  . HTN (hypertension) 01/10/2013   PCP:  [name], MD Pharmacy:   [place] [place], [place] [address] Phone: [phone] Fax: [phone]     Social Determinants of Health (SDOH) Interventions    Readmission Risk Interventions No flowsheet data found.

## 2019-03-02 DIAGNOSIS — G20 Parkinson's disease: Secondary | ICD-10-CM | POA: Diagnosis not present

## 2019-03-03 DIAGNOSIS — G20 Parkinson's disease: Secondary | ICD-10-CM | POA: Diagnosis not present

## 2019-03-03 DIAGNOSIS — E1140 Type 2 diabetes mellitus with diabetic neuropathy, unspecified: Secondary | ICD-10-CM | POA: Diagnosis not present

## 2019-03-03 DIAGNOSIS — I509 Heart failure, unspecified: Secondary | ICD-10-CM | POA: Diagnosis not present

## 2019-03-03 DIAGNOSIS — Z950 Presence of cardiac pacemaker: Secondary | ICD-10-CM | POA: Diagnosis not present

## 2019-03-03 DIAGNOSIS — E119 Type 2 diabetes mellitus without complications: Secondary | ICD-10-CM | POA: Diagnosis not present

## 2019-03-03 DIAGNOSIS — R269 Unspecified abnormalities of gait and mobility: Secondary | ICD-10-CM | POA: Diagnosis not present

## 2019-03-03 DIAGNOSIS — M6281 Muscle weakness (generalized): Secondary | ICD-10-CM | POA: Diagnosis not present

## 2019-03-03 DIAGNOSIS — I2510 Atherosclerotic heart disease of native coronary artery without angina pectoris: Secondary | ICD-10-CM | POA: Diagnosis not present

## 2019-03-03 DIAGNOSIS — I10 Essential (primary) hypertension: Secondary | ICD-10-CM | POA: Diagnosis not present

## 2019-03-05 ENCOUNTER — Other Ambulatory Visit: Payer: Self-pay

## 2019-03-05 ENCOUNTER — Emergency Department (HOSPITAL_COMMUNITY): Payer: Medicare HMO

## 2019-03-05 ENCOUNTER — Emergency Department (HOSPITAL_COMMUNITY)
Admission: EM | Admit: 2019-03-05 | Discharge: 2019-03-06 | Disposition: A | Discharge status: Home / Self Care | Payer: Medicare HMO | Attending: Emergency Medicine | Admitting: Emergency Medicine

## 2019-03-05 DIAGNOSIS — N183 Chronic kidney disease, stage 3 unspecified: Secondary | ICD-10-CM | POA: Insufficient documentation

## 2019-03-05 DIAGNOSIS — Z7984 Long term (current) use of oral hypoglycemic drugs: Secondary | ICD-10-CM | POA: Insufficient documentation

## 2019-03-05 DIAGNOSIS — G20 Parkinson's disease: Secondary | ICD-10-CM | POA: Insufficient documentation

## 2019-03-05 DIAGNOSIS — E1122 Type 2 diabetes mellitus with diabetic chronic kidney disease: Secondary | ICD-10-CM | POA: Insufficient documentation

## 2019-03-05 DIAGNOSIS — Z20828 Contact with and (suspected) exposure to other viral communicable diseases: Secondary | ICD-10-CM | POA: Insufficient documentation

## 2019-03-05 DIAGNOSIS — E119 Type 2 diabetes mellitus without complications: Secondary | ICD-10-CM | POA: Diagnosis not present

## 2019-03-05 DIAGNOSIS — J189 Pneumonia, unspecified organism: Secondary | ICD-10-CM

## 2019-03-05 DIAGNOSIS — R531 Weakness: Secondary | ICD-10-CM | POA: Insufficient documentation

## 2019-03-05 DIAGNOSIS — Z87891 Personal history of nicotine dependence: Secondary | ICD-10-CM | POA: Diagnosis not present

## 2019-03-05 DIAGNOSIS — E860 Dehydration: Secondary | ICD-10-CM

## 2019-03-05 DIAGNOSIS — I2510 Atherosclerotic heart disease of native coronary artery without angina pectoris: Secondary | ICD-10-CM | POA: Diagnosis not present

## 2019-03-05 DIAGNOSIS — D649 Anemia, unspecified: Secondary | ICD-10-CM | POA: Diagnosis not present

## 2019-03-05 DIAGNOSIS — Z950 Presence of cardiac pacemaker: Secondary | ICD-10-CM | POA: Diagnosis not present

## 2019-03-05 DIAGNOSIS — I5030 Unspecified diastolic (congestive) heart failure: Secondary | ICD-10-CM | POA: Diagnosis not present

## 2019-03-05 DIAGNOSIS — G479 Sleep disorder, unspecified: Secondary | ICD-10-CM | POA: Diagnosis not present

## 2019-03-05 DIAGNOSIS — Z7409 Other reduced mobility: Secondary | ICD-10-CM | POA: Diagnosis not present

## 2019-03-05 DIAGNOSIS — R413 Other amnesia: Secondary | ICD-10-CM | POA: Diagnosis not present

## 2019-03-05 DIAGNOSIS — R5383 Other fatigue: Secondary | ICD-10-CM | POA: Diagnosis present

## 2019-03-05 DIAGNOSIS — E1165 Type 2 diabetes mellitus with hyperglycemia: Secondary | ICD-10-CM | POA: Diagnosis not present

## 2019-03-05 DIAGNOSIS — R509 Fever, unspecified: Secondary | ICD-10-CM | POA: Diagnosis not present

## 2019-03-05 DIAGNOSIS — R609 Edema, unspecified: Secondary | ICD-10-CM | POA: Diagnosis not present

## 2019-03-05 DIAGNOSIS — R05 Cough: Secondary | ICD-10-CM | POA: Diagnosis not present

## 2019-03-05 DIAGNOSIS — I130 Hypertensive heart and chronic kidney disease with heart failure and stage 1 through stage 4 chronic kidney disease, or unspecified chronic kidney disease: Secondary | ICD-10-CM | POA: Insufficient documentation

## 2019-03-05 DIAGNOSIS — Z7901 Long term (current) use of anticoagulants: Secondary | ICD-10-CM | POA: Insufficient documentation

## 2019-03-05 DIAGNOSIS — Z951 Presence of aortocoronary bypass graft: Secondary | ICD-10-CM | POA: Diagnosis not present

## 2019-03-05 DIAGNOSIS — N179 Acute kidney failure, unspecified: Secondary | ICD-10-CM | POA: Diagnosis not present

## 2019-03-05 DIAGNOSIS — R404 Transient alteration of awareness: Secondary | ICD-10-CM | POA: Diagnosis not present

## 2019-03-05 DIAGNOSIS — Z79899 Other long term (current) drug therapy: Secondary | ICD-10-CM | POA: Insufficient documentation

## 2019-03-05 DIAGNOSIS — Z8546 Personal history of malignant neoplasm of prostate: Secondary | ICD-10-CM | POA: Insufficient documentation

## 2019-03-05 MED ORDER — SODIUM CHLORIDE 0.9 % IV SOLN
1.0000 g | Freq: Once | INTRAVENOUS | Status: AC
  Administered 2019-03-06: 1 g via INTRAVENOUS
  Filled 2019-03-05: qty 10

## 2019-03-05 MED ORDER — AZITHROMYCIN 250 MG PO TABS
500.0000 mg | ORAL_TABLET | Freq: Once | ORAL | Status: AC
  Administered 2019-03-06: 500 mg via ORAL
  Filled 2019-03-05: qty 2

## 2019-03-05 MED ORDER — SODIUM CHLORIDE 0.9 % IV SOLN
1000.0000 mL | INTRAVENOUS | Status: DC
  Administered 2019-03-06: 1000 mL via INTRAVENOUS

## 2019-03-05 MED ORDER — SODIUM CHLORIDE 0.9 % IV BOLUS (SEPSIS)
500.0000 mL | Freq: Once | INTRAVENOUS | Status: AC
  Administered 2019-03-05: 500 mL via INTRAVENOUS

## 2019-03-05 NOTE — Patient Outreach (Signed)
[facility]) Care Management  03/05/2019  [name] Powell September 02, 1922 [id number]    EMMI-General Discharge RED ON EMMI ALERT Day #1 Date: 03/03/2019 Red Alert Reason: "Unfilled prescriptions? Yes Able to fill today/tomorrow? No"   Outreach attempt # 1 to patient. Spoke with spouse(DPR on file). Reviewed and addressed red alerts with spouse. She denies any issues with meds. She reports that patient has all his meds in the home. They are getting ready to go to PCP follow up appt shortly. Spouse confirms that she will be taking all of his meds/med list to PCP appt for med review. They reside in [place]. Spouse voices that they have paid caregivers in the home throughout the day. She denies any RN CM needs or concerns and declines services.        Plan: RN CM will close case at this time.  Enzo Montgomery, RN,BSN,CCM [place] Management Telephonic Care Management Coordinator Direct Phone: [phone] Toll Free: [phone] Fax: [phone]

## 2019-03-05 NOTE — ED Provider Notes (Signed)
[facility] DEPT Provider Note   CSN: [id number] Arrival date & time: 03/05/19  2143     History   Chief Complaint Chief Complaint  Patient presents with  . Fatigue    per family    HPI [name] Powell is a 85 y.o. male.     HPI Patient presents to the ED for evaluation of fatigue.  Patient is a resident of [facility].  At baseline he has Parkinson's and dementia.  Patient was recently admitted to the hospital from October 15 to October 22.  Patient had fever and generalized weakness.  He was diagnosed with pneumonia and was treated with a course of antibiotics.  According to the EMS report family members called EMS because the patient's had a steady decline in his strength and ability to perform ADLs.  According to the discharge summary notes the patient had impaired mobility.  They were recommending wheelchair assistance.  Patient himself, denies any complaints.  He states he is feeling fine.  He denies any headache or chest pain.  No abdominal pain.  No vomiting or diarrhea.  No fevers or chills. Past Medical History:  Diagnosis Date  . Anemia    chronic  . Atrial fibrillation ([place])   . Cancer ([place])   . CHB (complete heart block) ([place])   . Coronary artery disease   . Diabetes mellitus   . Dyslipidemia   . H/O prostate cancer   . Hypertension   . New onset atrial flutter, persistent 07/04/2014  . Pacemaker 12/12/2006   Medtronic adapta  . Pleural effusion, left   . Pneumonia 09/2015  . Prostate cancer ([place])   . 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 Ventricular tachycardia (paroxysmal) ([place]) 03/28/2014    Patient Active Problem List   Diagnosis Date Noted  . CAP (community acquired pneumonia) 02/23/2019  . Parkinson's disease ([place]) 02/22/2019  . Acute encephalopathy 02/22/2019  . Symptomatic anemia 10/23/2018  . Pacemaker battery depletion 02/02/2017  . CHF (congestive heart  failure) ([place]) 07/27/2016  . Weakness generalized 07/27/2016  . Generalized weakness 07/27/2016  . Long term current use of anticoagulant 01/16/2016  . Acute respiratory failure with hypoxia ([place]) 09/15/2015  . Acute on chronic diastolic congestive heart failure ([place]) 09/15/2015  . CKD (chronic kidney disease), stage III ([place]) 09/15/2015  . Atrial fibrillation ([place])   . Pleural effusion, left   . Acute diastolic (congestive) heart failure ([place])   . Community acquired pneumonia 02/19/2015  . Atrial flutter ([place]) 01/20/2015  . New onset atrial flutter, persistent 07/04/2014  . Ventricular tachycardia (paroxysmal) ([place]) 03/28/2014  . Chronic diastolic congestive heart failure ([place]) 01/02/2014  . Pacemaker dependent - Medtronic 01/10/2013  . CAD s/p CABG 2003 01/10/2013  . Sinus arrest 01/10/2013  . CHB (complete heart block) ([place]) 01/10/2013  . DM2 (diabetes mellitus, type 2) ([place]) 01/10/2013  . Dyslipidemia 01/10/2013  . HTN (hypertension) 01/10/2013    Past Surgical History:  Procedure Laterality Date  . CORONARY ARTERY BYPASS GRAFT  09/04/2001   LIMA to LAD,SVG to diagonal,SVG to ramus intermedius,SVG to PDA  . NM MYOVIEW LTD  01/09/2010   no ischemia  . PACEMAKER INSERTION  12/12/2006   Medtronic adapta  . PPM GENERATOR CHANGEOUT N/A 02/02/2017   Procedure: PPM GENERATOR CHANGEOUT - DUAL CHAMBER;  Surgeon: [name], MD;  Location: [facility];  Service: Cardiovascular;  Laterality: N/A;  . PROSTATE SURGERY  2001   cancer  . TONSILLECTOMY          Home Medications    Prior to Admission medications   Medication Sig Start Date End Date Taking? Authorizing Provider  atorvastatin (LIPITOR) 20 MG tablet Take 20 mg by mouth daily.   Yes [provider]  carbidopa-levodopa (SINEMET IR) 25-100 MG tablet Take 1 tablet by mouth 3 (three) times daily. 01/03/19  Yes Penumalli, Earlean Polka, MD  Cholecalciferol (VITAMIN D PO) Take 10,000 Units by mouth every Friday. On  Friday    Yes [provider]  ELIQUIS 2.5 MG TABS tablet TAKE 1 TABLET TWICE DAILY Patient taking differently: Take 2.5 mg by mouth 2 (two) times daily.  10/26/18  Yes Croitoru, Mihai, MD  furosemide (LASIX) 40 MG tablet Take 40 mg by mouth daily.   Yes [provider]  glimepiride (AMARYL) 4 MG tablet Take 4 mg by mouth daily before breakfast.   Yes [provider]  JANUVIA 50 MG tablet Take 1 tablet by mouth every evening.  05/27/15  Yes [provider]  metoprolol tartrate (LOPRESSOR) 50 MG tablet TAKE 2 TABLETS IN THE MORNING  AND TAKE 1 TABLET IN THE EVENING Patient taking differently: Take 50-100 mg by mouth See admin instructions. Take 2 tablets by mouth in the morning and 1 tablet in the evening 05/29/18  Yes Croitoru, Mihai, MD  Multiple Vitamins-Minerals (PRESERVISION AREDS) CAPS Take 1 capsule by mouth 2 (two) times daily.   Yes [provider]  polyethylene glycol (MIRALAX / GLYCOLAX) packet Take 17 g by mouth daily. To prevent constipation    Yes [provider]  QUEtiapine (SEROQUEL) 25 MG tablet Take 0.5 tablets (12.5 mg total) by mouth at bedtime. 03/01/19 03/31/19 Yes Donne Hazel, MD  vitamin B-12 (CYANOCOBALAMIN) 1000 MCG tablet Take 1,000 mcg by mouth daily.   Yes [provider]    Family History Family History  Problem Relation Age of Onset  . Heart disease Mother   . Heart attack Mother   . Other Father        sepsis  . Other Sister        brain tumor    Social History Social History   Tobacco Use  . Smoking status: Former Smoker    Types: Cigarettes, Cigars    Quit date: 05/14/1962    Years since quitting: 56.8  . Smokeless tobacco: Never Used  Substance Use Topics  . Alcohol use: Yes    Comment: seldom  . Drug use: No     Allergies   Milk-related compounds, Lanoxin [digoxin], and Mexitil [mexiletine]   Review of Systems Review of Systems  All other systems reviewed and are negative.     Physical Exam Updated Vital Signs BP (!) 156/75 (BP Location: Right Arm)   Pulse 61   Temp 97.6 F (36.4 C) (Oral)   Resp 12   Ht 1.727 m (5\' 8" )   Wt 82 kg   SpO2 99%   BMI 27.49 kg/m   Physical Exam Vitals signs and nursing note reviewed.  Constitutional:      General: He is not in acute distress.    Comments: Elderly, frail  HENT:     Head: Normocephalic and atraumatic.     Right Ear: External ear normal.     Left Ear: External ear normal.  Eyes:     General: No scleral icterus.       Right eye: No discharge.  Left eye: No discharge.     Conjunctiva/sclera: Conjunctivae normal.  Neck:     Musculoskeletal: Neck supple.     Trachea: No tracheal deviation.  Cardiovascular:     Rate and Rhythm: Normal rate and regular rhythm.  Pulmonary:     Effort: Pulmonary effort is normal. No respiratory distress.     Breath sounds: Normal breath sounds. No stridor. No wheezing or rales.  Abdominal:     General: Bowel sounds are normal. There is no distension.     Palpations: Abdomen is soft.     Tenderness: There is no abdominal tenderness. There is no guarding or rebound.  Musculoskeletal:        General: No tenderness.  Skin:    General: Skin is warm and dry.     Findings: No rash.  Neurological:     General: No focal deficit present.     Mental Status: He is alert.     Cranial Nerves: No cranial nerve deficit (no facial droop, extraocular movements intact, no slurred speech).     Sensory: No sensory deficit.     Motor: No abnormal muscle tone or seizure activity.     Coordination: Coordination normal.     Comments: Equal grip strength bilaterally, normal plantar flexion strength bilaterally      ED Treatments / Results  Labs (all labs ordered are listed, but only abnormal results are displayed) Labs Reviewed  SARS CORONAVIRUS 2 (TAT 6-24 HRS)  CBC WITH DIFFERENTIAL/PLATELET  COMPREHENSIVE METABOLIC PANEL  URINALYSIS, ROUTINE W REFLEX MICROSCOPIC     EKG None  Radiology Dg Chest Portable 1 View  Result Date: 03/05/2019 CLINICAL DATA:  Fatigue and cough EXAM: PORTABLE CHEST 1 VIEW COMPARISON:  02/28/2019 FINDINGS: Cardiac shadow is enlarged but stable. Pacing device and postsurgical changes are noted. The overall inspiratory effort is poor. Patchy parenchymal opacities are noted consistent with multifocal pneumonia. No bony abnormality is seen. IMPRESSION: Multifocal pneumonia. Electronically Signed   By: Inez Catalina M.D.   On: 03/05/2019 23:09    Procedures Procedures (including critical care time)  Medications Ordered in ED Medications  sodium chloride 0.9 % bolus 500 mL (500 mLs Intravenous New Bag/Given 03/05/19 2345)    Followed by  0.9 %  sodium chloride infusion (has no administration in time range)  cefTRIAXone (ROCEPHIN) 1 g in sodium chloride 0.9 % 100 mL IVPB (has no administration in time range)  azithromycin (ZITHROMAX) tablet 500 mg (has no administration in time range)     Initial Impression / Assessment and Plan / ED Course  I have reviewed the triage vital signs and the nursing notes.  Pertinent labs & imaging results that were available during my care of the patient were reviewed by me and considered in my medical decision making (see chart for details).   Pt with recent admission for pneumonia.  Now with increasing fatigue.  CXR suggests multifocal pna.  Labs pending.  Hemodynamically stable.  Dr Rolland Porter will assume care.  Anticipate admission  Final Clinical Impressions(s) / ED Diagnoses   Final diagnoses:  Multifocal pneumonia    ED Discharge Orders    None       Dorie Rank, MD 03/05/19 2346

## 2019-03-05 NOTE — ED Triage Notes (Addendum)
Pt presents to ED via [facility]. EMS reports that pt was admitted recently for HCAP but according to family on scene who called for EMS pt has had a steady decline in his strength and ability to perform ADLs. Pt has no c/o injury or illness at this time and VS WNL. EMS reports that staff and family reports pt is at mental baseline

## 2019-03-06 DIAGNOSIS — I2510 Atherosclerotic heart disease of native coronary artery without angina pectoris: Secondary | ICD-10-CM | POA: Diagnosis not present

## 2019-03-06 DIAGNOSIS — E119 Type 2 diabetes mellitus without complications: Secondary | ICD-10-CM | POA: Diagnosis not present

## 2019-03-06 DIAGNOSIS — G20 Parkinson's disease: Secondary | ICD-10-CM | POA: Diagnosis not present

## 2019-03-06 DIAGNOSIS — I10 Essential (primary) hypertension: Secondary | ICD-10-CM | POA: Diagnosis not present

## 2019-03-06 DIAGNOSIS — M6281 Muscle weakness (generalized): Secondary | ICD-10-CM | POA: Diagnosis not present

## 2019-03-06 DIAGNOSIS — I509 Heart failure, unspecified: Secondary | ICD-10-CM | POA: Diagnosis not present

## 2019-03-06 DIAGNOSIS — E1140 Type 2 diabetes mellitus with diabetic neuropathy, unspecified: Secondary | ICD-10-CM | POA: Diagnosis not present

## 2019-03-06 DIAGNOSIS — R5381 Other malaise: Secondary | ICD-10-CM | POA: Diagnosis not present

## 2019-03-06 DIAGNOSIS — Z743 Need for continuous supervision: Secondary | ICD-10-CM | POA: Diagnosis not present

## 2019-03-06 DIAGNOSIS — Z950 Presence of cardiac pacemaker: Secondary | ICD-10-CM | POA: Diagnosis not present

## 2019-03-06 DIAGNOSIS — R269 Unspecified abnormalities of gait and mobility: Secondary | ICD-10-CM | POA: Diagnosis not present

## 2019-03-06 DIAGNOSIS — R279 Unspecified lack of coordination: Secondary | ICD-10-CM | POA: Diagnosis not present

## 2019-03-06 LAB — URINALYSIS, ROUTINE W REFLEX MICROSCOPIC
Bacteria, UA: NONE SEEN
Bilirubin Urine: NEGATIVE
Glucose, UA: NEGATIVE mg/dL
Hgb urine dipstick: NEGATIVE
Ketones, ur: NEGATIVE mg/dL
Leukocytes,Ua: NEGATIVE
Nitrite: NEGATIVE
Protein, ur: 30 mg/dL — AB
Specific Gravity, Urine: 1.012 (ref 1.005–1.030)
pH: 7 (ref 5.0–8.0)

## 2019-03-06 LAB — CBC WITH DIFFERENTIAL/PLATELET
Abs Immature Granulocytes: 0.29 10*3/uL — ABNORMAL HIGH (ref 0.00–0.07)
Basophils Absolute: 0 10*3/uL (ref 0.0–0.1)
Basophils Relative: 1 %
Eosinophils Absolute: 0.1 10*3/uL (ref 0.0–0.5)
Eosinophils Relative: 1 %
HCT: 30 % — ABNORMAL LOW (ref 39.0–52.0)
Hemoglobin: 9.5 g/dL — ABNORMAL LOW (ref 13.0–17.0)
Immature Granulocytes: 4 %
Lymphocytes Relative: 18 %
Lymphs Abs: 1.4 10*3/uL (ref 0.7–4.0)
MCH: 34.5 pg — ABNORMAL HIGH (ref 26.0–34.0)
MCHC: 31.7 g/dL (ref 30.0–36.0)
MCV: 109.1 fL — ABNORMAL HIGH (ref 80.0–100.0)
Monocytes Absolute: 2.6 10*3/uL — ABNORMAL HIGH (ref 0.1–1.0)
Monocytes Relative: 33 %
Neutro Abs: 3.6 10*3/uL (ref 1.7–7.7)
Neutrophils Relative %: 43 %
Platelets: 306 10*3/uL (ref 150–400)
RBC: 2.75 MIL/uL — ABNORMAL LOW (ref 4.22–5.81)
RDW: 16.3 % — ABNORMAL HIGH (ref 11.5–15.5)
WBC: 8 10*3/uL (ref 4.0–10.5)
nRBC: 0 % (ref 0.0–0.2)

## 2019-03-06 LAB — COMPREHENSIVE METABOLIC PANEL
ALT: 17 U/L (ref 0–44)
AST: 23 U/L (ref 15–41)
Albumin: 3.1 g/dL — ABNORMAL LOW (ref 3.5–5.0)
Alkaline Phosphatase: 96 U/L (ref 38–126)
Anion gap: 9 (ref 5–15)
BUN: 63 mg/dL — ABNORMAL HIGH (ref 8–23)
CO2: 27 mmol/L (ref 22–32)
Calcium: 8.7 mg/dL — ABNORMAL LOW (ref 8.9–10.3)
Chloride: 101 mmol/L (ref 98–111)
Creatinine, Ser: 2.16 mg/dL — ABNORMAL HIGH (ref 0.61–1.24)
GFR calc Af Amer: 29 mL/min — ABNORMAL LOW (ref >60)
GFR calc non Af Amer: 25 mL/min — ABNORMAL LOW (ref >60)
Glucose, Bld: 170 mg/dL — ABNORMAL HIGH (ref 70–99)
Potassium: 4.4 mmol/L (ref 3.5–5.1)
Sodium: 137 mmol/L (ref 135–145)
Total Bilirubin: 0.5 mg/dL (ref 0.3–1.2)
Total Protein: 7 g/dL (ref 6.5–8.1)

## 2019-03-06 LAB — SARS CORONAVIRUS 2 (TAT 6-24 HRS): SARS Coronavirus 2: NEGATIVE

## 2019-03-06 MED ORDER — SODIUM CHLORIDE 0.9 % IV BOLUS: 500.0000 mL | Freq: Once | INTRAVENOUS | Status: DC

## 2019-03-06 NOTE — Discharge Instructions (Signed)
Try to encourage him to drink more fluids. Recheck if he gets worse such as fever, struggling to breathe or not acting like his usual self.

## 2019-03-06 NOTE — ED Provider Notes (Signed)
Patient left that patient left a change of shift to get results of his blood work.  When I rechecked patient at 1:19 AM his son is in the room.  He states patient has not been drinking as much as he should.  He is having decreased urinary output.  He had an appointment with his primary care doctor this morning who felt like looking at his blood work that he was dehydrated and wanted him to come to the ED for IV fluids.  He has not had any coughing or shortness of breath.  He was discharged from the hospital a few days ago for pneumonia and he had finished his antibiotics in the hospital.  Son states his understanding was patient was to come get IV fluids and then he could go back to his facility.  Patient has had a 500 cc bolus.  He was given a second 500 cc bolus.  I reviewed his blood work which is basically similar to what he had before.  We also discussed that can take several weeks before a pneumonia will disappear from a chest x-ray.  Patient seems to be confused however he is interactive.  His pulse ox was 100% on room air.  Heart rate was 60 and blood pressure was 167/75.  Son does admit that they would not of brought him to the hospital if his primary care doctor did not think he needed some IV fluids.  02:20 AM nurse reports he has had a total of 1000 cc of NS. Will discharge patient back to his facility and wife.   Results for orders placed or performed during the hospital encounter of 03/05/19  CBC with Differential  Result Value Ref Range   WBC 8.0 4.0 - 10.5 K/uL   RBC 2.75 (L) 4.22 - 5.81 MIL/uL   Hemoglobin 9.5 (L) 13.0 - 17.0 g/dL   HCT 30.0 (L) 39.0 - 52.0 %   MCV 109.1 (H) 80.0 - 100.0 fL   MCH 34.5 (H) 26.0 - 34.0 pg   MCHC 31.7 30.0 - 36.0 g/dL   RDW 16.3 (H) 11.5 - 15.5 %   Platelets 306 150 - 400 K/uL   nRBC 0.0 0.0 - 0.2 %   Neutrophils Relative % 43 %   Neutro Abs 3.6 1.7 - 7.7 K/uL   Lymphocytes Relative 18 %   Lymphs Abs 1.4 0.7 - 4.0 K/uL   Monocytes Relative 33 %    Monocytes Absolute 2.6 (H) 0.1 - 1.0 K/uL   Eosinophils Relative 1 %   Eosinophils Absolute 0.1 0.0 - 0.5 K/uL   Basophils Relative 1 %   Basophils Absolute 0.0 0.0 - 0.1 K/uL   Immature Granulocytes 4 %   Abs Immature Granulocytes 0.29 (H) 0.00 - 0.07 K/uL  Comprehensive metabolic panel  Result Value Ref Range   Sodium 137 135 - 145 mmol/L   Potassium 4.4 3.5 - 5.1 mmol/L   Chloride 101 98 - 111 mmol/L   CO2 27 22 - 32 mmol/L   Glucose, Bld 170 (H) 70 - 99 mg/dL   BUN 63 (H) 8 - 23 mg/dL   Creatinine, Ser 2.16 (H) 0.61 - 1.24 mg/dL   Calcium 8.7 (L) 8.9 - 10.3 mg/dL   Total Protein 7.0 6.5 - 8.1 g/dL   Albumin 3.1 (L) 3.5 - 5.0 g/dL   AST 23 15 - 41 U/L   ALT 17 0 - 44 U/L   Alkaline Phosphatase 96 38 - 126 U/L   Total Bilirubin  0.5 0.3 - 1.2 mg/dL   GFR calc non Af Amer 25 (L) >60 mL/min   GFR calc Af Amer 29 (L) >60 mL/min   Anion gap 9 5 - 15   Laboratory interpretation all normal except stable anemia, stable renal insufficiency   Dg Chest Portable 1 View  Result Date: 03/05/2019 CLINICAL DATA:  Fatigue and cough EXAM: PORTABLE CHEST 1 VIEW COMPARISON:  02/28/2019 FINDINGS: Cardiac shadow is enlarged but stable. Pacing device and postsurgical changes are noted. The overall inspiratory effort is poor. Patchy parenchymal opacities are noted consistent with multifocal pneumonia. No bony abnormality is seen. IMPRESSION: Multifocal pneumonia. Electronically Signed   By: Inez Catalina M.D.   On: 03/05/2019 23:09   Dg Chest Portable 1 View  Result Date: 02/22/2019 CLINICAL DATA:  Fever and cough NGS: Post sternotomy changes. Left-sided pacing device as before. Small left-sided pleural effusion. Patchy airspace disease at the bases. Enlarged cardiomediastinal silhouette with central vascular congestion. Aortic atherosclerosis. No pneumothorax. IMPRESSION: 1. Patchy basilar airspace disease which may reflect atelectasis or minimal infiltrates 2. Cardiomegaly with slight central  congestion. Possible small left effusion Electronically Signed   By: Donavan Foil M.D.   On: 02/22/2019 21:32   Diagnoses that have been ruled out:  None  Diagnoses that are still under consideration:  None  Final diagnoses:  Multifocal pneumonia  Dehydration   Plan discharge  Rolland Porter, MD, Barbette Or, MD 03/06/19 Reece Agar

## 2019-03-06 NOTE — ED Notes (Signed)
[facility] contacted for transport back to [facility].

## 2019-03-06 NOTE — ED Notes (Signed)
Urine culture sent to lab with specimen

## 2019-03-09 ENCOUNTER — Other Ambulatory Visit: Payer: Self-pay | Admitting: Cardiovascular Disease

## 2019-03-09 DIAGNOSIS — M6281 Muscle weakness (generalized): Secondary | ICD-10-CM | POA: Diagnosis not present

## 2019-03-09 DIAGNOSIS — I10 Essential (primary) hypertension: Secondary | ICD-10-CM | POA: Diagnosis not present

## 2019-03-09 DIAGNOSIS — I509 Heart failure, unspecified: Secondary | ICD-10-CM | POA: Diagnosis not present

## 2019-03-09 DIAGNOSIS — R269 Unspecified abnormalities of gait and mobility: Secondary | ICD-10-CM | POA: Diagnosis not present

## 2019-03-09 DIAGNOSIS — E1140 Type 2 diabetes mellitus with diabetic neuropathy, unspecified: Secondary | ICD-10-CM | POA: Diagnosis not present

## 2019-03-09 DIAGNOSIS — E119 Type 2 diabetes mellitus without complications: Secondary | ICD-10-CM | POA: Diagnosis not present

## 2019-03-09 DIAGNOSIS — Z950 Presence of cardiac pacemaker: Secondary | ICD-10-CM | POA: Diagnosis not present

## 2019-03-09 DIAGNOSIS — G20 Parkinson's disease: Secondary | ICD-10-CM | POA: Diagnosis not present

## 2019-03-09 DIAGNOSIS — I2510 Atherosclerotic heart disease of native coronary artery without angina pectoris: Secondary | ICD-10-CM | POA: Diagnosis not present

## 2019-03-09 NOTE — Telephone Encounter (Signed)
89 M 79.7 kg, SCr 2.16 (10/20), LOV MC 1/20

## 2019-03-09 NOTE — Telephone Encounter (Signed)
Refill request

## 2019-03-13 DIAGNOSIS — E119 Type 2 diabetes mellitus without complications: Secondary | ICD-10-CM | POA: Diagnosis not present

## 2019-03-13 DIAGNOSIS — N183 Chronic kidney disease, stage 3 unspecified: Secondary | ICD-10-CM | POA: Diagnosis not present

## 2019-03-13 DIAGNOSIS — I2510 Atherosclerotic heart disease of native coronary artery without angina pectoris: Secondary | ICD-10-CM | POA: Diagnosis not present

## 2019-03-13 DIAGNOSIS — E1140 Type 2 diabetes mellitus with diabetic neuropathy, unspecified: Secondary | ICD-10-CM | POA: Diagnosis not present

## 2019-03-13 DIAGNOSIS — I509 Heart failure, unspecified: Secondary | ICD-10-CM | POA: Diagnosis not present

## 2019-03-13 DIAGNOSIS — R269 Unspecified abnormalities of gait and mobility: Secondary | ICD-10-CM | POA: Diagnosis not present

## 2019-03-13 DIAGNOSIS — M6281 Muscle weakness (generalized): Secondary | ICD-10-CM | POA: Diagnosis not present

## 2019-03-13 DIAGNOSIS — G20 Parkinson's disease: Secondary | ICD-10-CM | POA: Diagnosis not present

## 2019-03-13 DIAGNOSIS — Z950 Presence of cardiac pacemaker: Secondary | ICD-10-CM | POA: Diagnosis not present

## 2019-03-16 DIAGNOSIS — Z950 Presence of cardiac pacemaker: Secondary | ICD-10-CM | POA: Diagnosis not present

## 2019-03-16 DIAGNOSIS — R269 Unspecified abnormalities of gait and mobility: Secondary | ICD-10-CM | POA: Diagnosis not present

## 2019-03-16 DIAGNOSIS — E1140 Type 2 diabetes mellitus with diabetic neuropathy, unspecified: Secondary | ICD-10-CM | POA: Diagnosis not present

## 2019-03-16 DIAGNOSIS — E119 Type 2 diabetes mellitus without complications: Secondary | ICD-10-CM | POA: Diagnosis not present

## 2019-03-16 DIAGNOSIS — I2510 Atherosclerotic heart disease of native coronary artery without angina pectoris: Secondary | ICD-10-CM | POA: Diagnosis not present

## 2019-03-16 DIAGNOSIS — G20 Parkinson's disease: Secondary | ICD-10-CM | POA: Diagnosis not present

## 2019-03-16 DIAGNOSIS — N183 Chronic kidney disease, stage 3 unspecified: Secondary | ICD-10-CM | POA: Diagnosis not present

## 2019-03-16 DIAGNOSIS — I509 Heart failure, unspecified: Secondary | ICD-10-CM | POA: Diagnosis not present

## 2019-03-16 DIAGNOSIS — M6281 Muscle weakness (generalized): Secondary | ICD-10-CM | POA: Diagnosis not present

## 2019-03-19 ENCOUNTER — Ambulatory Visit: Payer: Medicare HMO | Admitting: Diagnostic Neuroimaging

## 2019-03-20 DIAGNOSIS — R269 Unspecified abnormalities of gait and mobility: Secondary | ICD-10-CM | POA: Diagnosis not present

## 2019-03-20 DIAGNOSIS — I509 Heart failure, unspecified: Secondary | ICD-10-CM | POA: Diagnosis not present

## 2019-03-20 DIAGNOSIS — G20 Parkinson's disease: Secondary | ICD-10-CM | POA: Diagnosis not present

## 2019-03-20 DIAGNOSIS — I2510 Atherosclerotic heart disease of native coronary artery without angina pectoris: Secondary | ICD-10-CM | POA: Diagnosis not present

## 2019-03-20 DIAGNOSIS — M6281 Muscle weakness (generalized): Secondary | ICD-10-CM | POA: Diagnosis not present

## 2019-03-20 DIAGNOSIS — E1140 Type 2 diabetes mellitus with diabetic neuropathy, unspecified: Secondary | ICD-10-CM | POA: Diagnosis not present

## 2019-03-20 DIAGNOSIS — Z950 Presence of cardiac pacemaker: Secondary | ICD-10-CM | POA: Diagnosis not present

## 2019-03-20 DIAGNOSIS — I10 Essential (primary) hypertension: Secondary | ICD-10-CM | POA: Diagnosis not present

## 2019-03-20 DIAGNOSIS — E119 Type 2 diabetes mellitus without complications: Secondary | ICD-10-CM | POA: Diagnosis not present

## 2019-03-23 DIAGNOSIS — M6281 Muscle weakness (generalized): Secondary | ICD-10-CM | POA: Diagnosis not present

## 2019-03-23 DIAGNOSIS — I509 Heart failure, unspecified: Secondary | ICD-10-CM | POA: Diagnosis not present

## 2019-03-23 DIAGNOSIS — I10 Essential (primary) hypertension: Secondary | ICD-10-CM | POA: Diagnosis not present

## 2019-03-23 DIAGNOSIS — Z8546 Personal history of malignant neoplasm of prostate: Secondary | ICD-10-CM | POA: Diagnosis not present

## 2019-03-23 DIAGNOSIS — E1140 Type 2 diabetes mellitus with diabetic neuropathy, unspecified: Secondary | ICD-10-CM | POA: Diagnosis not present

## 2019-03-23 DIAGNOSIS — G20 Parkinson's disease: Secondary | ICD-10-CM | POA: Diagnosis not present

## 2019-03-23 DIAGNOSIS — E782 Mixed hyperlipidemia: Secondary | ICD-10-CM | POA: Diagnosis not present

## 2019-03-23 DIAGNOSIS — E119 Type 2 diabetes mellitus without complications: Secondary | ICD-10-CM | POA: Diagnosis not present

## 2019-03-23 DIAGNOSIS — I2510 Atherosclerotic heart disease of native coronary artery without angina pectoris: Secondary | ICD-10-CM | POA: Diagnosis not present

## 2019-03-23 DIAGNOSIS — N400 Enlarged prostate without lower urinary tract symptoms: Secondary | ICD-10-CM | POA: Diagnosis not present

## 2019-03-23 DIAGNOSIS — Z950 Presence of cardiac pacemaker: Secondary | ICD-10-CM | POA: Diagnosis not present

## 2019-03-23 DIAGNOSIS — R269 Unspecified abnormalities of gait and mobility: Secondary | ICD-10-CM | POA: Diagnosis not present

## 2019-03-23 DIAGNOSIS — D649 Anemia, unspecified: Secondary | ICD-10-CM | POA: Diagnosis not present

## 2019-03-26 ENCOUNTER — Encounter: Payer: Medicare HMO | Admitting: Cardiovascular Disease

## 2019-03-26 DIAGNOSIS — Z950 Presence of cardiac pacemaker: Secondary | ICD-10-CM | POA: Diagnosis not present

## 2019-03-26 DIAGNOSIS — I509 Heart failure, unspecified: Secondary | ICD-10-CM | POA: Diagnosis not present

## 2019-03-26 DIAGNOSIS — E1140 Type 2 diabetes mellitus with diabetic neuropathy, unspecified: Secondary | ICD-10-CM | POA: Diagnosis not present

## 2019-03-26 DIAGNOSIS — R269 Unspecified abnormalities of gait and mobility: Secondary | ICD-10-CM | POA: Diagnosis not present

## 2019-03-26 DIAGNOSIS — I2510 Atherosclerotic heart disease of native coronary artery without angina pectoris: Secondary | ICD-10-CM | POA: Diagnosis not present

## 2019-03-26 DIAGNOSIS — E119 Type 2 diabetes mellitus without complications: Secondary | ICD-10-CM | POA: Diagnosis not present

## 2019-03-26 DIAGNOSIS — I10 Essential (primary) hypertension: Secondary | ICD-10-CM | POA: Diagnosis not present

## 2019-03-26 DIAGNOSIS — G20 Parkinson's disease: Secondary | ICD-10-CM | POA: Diagnosis not present

## 2019-03-26 DIAGNOSIS — M6281 Muscle weakness (generalized): Secondary | ICD-10-CM | POA: Diagnosis not present

## 2019-03-30 DIAGNOSIS — I509 Heart failure, unspecified: Secondary | ICD-10-CM | POA: Diagnosis not present

## 2019-03-30 DIAGNOSIS — E119 Type 2 diabetes mellitus without complications: Secondary | ICD-10-CM | POA: Diagnosis not present

## 2019-03-30 DIAGNOSIS — Z950 Presence of cardiac pacemaker: Secondary | ICD-10-CM | POA: Diagnosis not present

## 2019-03-30 DIAGNOSIS — M6281 Muscle weakness (generalized): Secondary | ICD-10-CM | POA: Diagnosis not present

## 2019-03-30 DIAGNOSIS — R269 Unspecified abnormalities of gait and mobility: Secondary | ICD-10-CM | POA: Diagnosis not present

## 2019-03-30 DIAGNOSIS — G20 Parkinson's disease: Secondary | ICD-10-CM | POA: Diagnosis not present

## 2019-03-30 DIAGNOSIS — I2510 Atherosclerotic heart disease of native coronary artery without angina pectoris: Secondary | ICD-10-CM | POA: Diagnosis not present

## 2019-03-30 DIAGNOSIS — E1140 Type 2 diabetes mellitus with diabetic neuropathy, unspecified: Secondary | ICD-10-CM | POA: Diagnosis not present

## 2019-03-30 DIAGNOSIS — I10 Essential (primary) hypertension: Secondary | ICD-10-CM | POA: Diagnosis not present

## 2019-04-02 ENCOUNTER — Other Ambulatory Visit: Payer: Self-pay

## 2019-04-02 ENCOUNTER — Ambulatory Visit (INDEPENDENT_AMBULATORY_CARE_PROVIDER_SITE_OTHER): Payer: Medicare HMO | Admitting: Cardiovascular Disease

## 2019-04-02 ENCOUNTER — Encounter: Payer: Self-pay | Admitting: Cardiovascular Disease

## 2019-04-02 VITALS — BP 138/50 | HR 65 | Temp 97.0°F | Ht 68.0 in | Wt 174.0 lb

## 2019-04-02 DIAGNOSIS — E785 Hyperlipidemia, unspecified: Secondary | ICD-10-CM

## 2019-04-02 DIAGNOSIS — E119 Type 2 diabetes mellitus without complications: Secondary | ICD-10-CM | POA: Diagnosis not present

## 2019-04-02 DIAGNOSIS — I4821 Permanent atrial fibrillation: Secondary | ICD-10-CM | POA: Diagnosis not present

## 2019-04-02 DIAGNOSIS — N184 Chronic kidney disease, stage 4 (severe): Secondary | ICD-10-CM

## 2019-04-02 DIAGNOSIS — I2510 Atherosclerotic heart disease of native coronary artery without angina pectoris: Secondary | ICD-10-CM

## 2019-04-02 DIAGNOSIS — I472 Ventricular tachycardia: Secondary | ICD-10-CM | POA: Diagnosis not present

## 2019-04-02 DIAGNOSIS — I442 Atrioventricular block, complete: Secondary | ICD-10-CM | POA: Diagnosis not present

## 2019-04-02 DIAGNOSIS — Z950 Presence of cardiac pacemaker: Secondary | ICD-10-CM

## 2019-04-02 DIAGNOSIS — I5032 Chronic diastolic (congestive) heart failure: Secondary | ICD-10-CM

## 2019-04-02 DIAGNOSIS — E1122 Type 2 diabetes mellitus with diabetic chronic kidney disease: Secondary | ICD-10-CM

## 2019-04-02 DIAGNOSIS — R269 Unspecified abnormalities of gait and mobility: Secondary | ICD-10-CM | POA: Diagnosis not present

## 2019-04-02 DIAGNOSIS — E1140 Type 2 diabetes mellitus with diabetic neuropathy, unspecified: Secondary | ICD-10-CM | POA: Diagnosis not present

## 2019-04-02 DIAGNOSIS — I4729 Other ventricular tachycardia: Secondary | ICD-10-CM

## 2019-04-02 DIAGNOSIS — M6281 Muscle weakness (generalized): Secondary | ICD-10-CM | POA: Diagnosis not present

## 2019-04-02 DIAGNOSIS — Z7901 Long term (current) use of anticoagulants: Secondary | ICD-10-CM

## 2019-04-02 DIAGNOSIS — I10 Essential (primary) hypertension: Secondary | ICD-10-CM | POA: Diagnosis not present

## 2019-04-02 DIAGNOSIS — I509 Heart failure, unspecified: Secondary | ICD-10-CM | POA: Diagnosis not present

## 2019-04-02 DIAGNOSIS — G20 Parkinson's disease: Secondary | ICD-10-CM | POA: Diagnosis not present

## 2019-04-02 NOTE — Progress Notes (Signed)
Patient ID: [name] Powell, male   DOB: June 10, 1922, 85 y.o.   MRN: [id number]    Cardiology Office Note    Date:  04/04/2019   ID:  [name] Powell, [name] Powell 1922-07-14, MRN [id number]  PCP:  [name], MD  Cardiologist:   [name], MD   No chief complaint on file.   History of Present Illness:  [name] Powell is a 85 y.o. male with CAD s/p CABG, sinus arrest and complete heart block (pacemaker dependent), now in long-term persistent atrial fibrillation, chronic diastolic heart failure, Parkinson's disease. He has a dual-chamber permanent pacemaker implanted in 2008, generator change out September 2018 (Medtronic Azure) and is pacemaker dependent.  He was hospitalized with pneumonia in late October.  He then was seen back in the emergency room on October 26 for dehydration and required intravenous fluids.  He is bounced back from the illness for the most part, but sleeps a lot.  Today he appears quite well.  He does not speak much and his wife fills in most of the review of systems.  The patient specifically denies any chest pain at rest exertion, dyspnea at rest or with exertion, orthopnea, paroxysmal nocturnal dyspnea, syncope, palpitations, focal neurological deficits, intermittent claudication, lower extremity edema, unexplained weight gain, cough, hemoptysis or wheezing.  Activity level continues to be very low, limited by Parkinson's disease.  He has not had falls or serious injuries  Interrogation of his dual-chamber permanent pacemaker (Medtronic Azure implanted September 2018) shows ongoing persistent atrial fibrillation and 96 % ventricular paced rhythm.  There is no ventricular escape rhythm when pacing is taken down to 30 bpm today.  There are frequent PVCs.  No episodes of high ventricular rate have been recorded.  Estimated generator longevity is just over 9 years.  Activity is only 0.1 hours/day, even lower than at his last visit.  Over 17 years have passed since his  CABG surgery in 2003. His last nuclear study in August 2015 was low risk (small apical scar, no ischemia, EF 44%). Echo August 2015 confirmed apical akinesis, but EF was estimated at 55%. 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  He has been in long-term persistent atrial fibrillation for well over a year. He is very sensitive to programming changes. He feels better at lower rate limit programmed at 60 rather than 70 bpm. He has treated coronary risk factors (hypertension, hyperlipidemia, diabetes mellitus type 2 on oral antidiabetics).   Past Medical History:  Diagnosis Date  . Anemia    chronic  . Atrial fibrillation ([place])   . Cancer ([place])   . CHB (complete heart block) ([place])   . Coronary artery disease   . Diabetes mellitus   . Dyslipidemia   . H/O prostate cancer   . Hypertension   . New onset atrial flutter, persistent 07/04/2014  . Pacemaker 12/12/2006   Medtronic adapta  . Pleural effusion, left   . Pneumonia 09/2015  . Prostate cancer ([place])   . 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 Ventricular tachycardia (paroxysmal) ([place]) 03/28/2014    Past Surgical History:  Procedure Laterality Date  . CORONARY ARTERY BYPASS GRAFT  09/04/2001   LIMA to LAD,SVG to diagonal,SVG to ramus intermedius,SVG to PDA  . NM MYOVIEW LTD  01/09/2010   no ischemia  . PACEMAKER INSERTION  12/12/2006   Medtronic adapta  . PPM GENERATOR CHANGEOUT N/A 02/02/2017  Procedure: PPM GENERATOR [place];  Surgeon: [name], MD;  Location: [facility];  Service: Cardiovascular;  Laterality: N/A;  . PROSTATE SURGERY  2001   cancer  . TONSILLECTOMY      Current Medications: Outpatient Medications Prior to Visit  Medication Sig Dispense Refill  . atorvastatin (LIPITOR) 20 MG tablet Take 20 mg by mouth daily.    . carbidopa-levodopa (SINEMET IR) 25-100 MG tablet Take 1 tablet by mouth 3 (three)  times daily. 270 tablet 4  . Cholecalciferol (VITAMIN D PO) Take 10,000 Units by mouth every Friday. On Friday     . ELIQUIS 2.5 MG TABS tablet TAKE 1 TABLET TWICE DAILY 180 tablet 1  . furosemide (LASIX) 40 MG tablet Take 40 mg by mouth daily.    Marland Kitchen glimepiride (AMARYL) 4 MG tablet Take 4 mg by mouth daily before breakfast.    . JANUVIA 50 MG tablet Take 1 tablet by mouth every evening.     . metoprolol tartrate (LOPRESSOR) 50 MG tablet TAKE 2 TABLETS IN THE MORNING  AND TAKE 1 TABLET IN THE EVENING 270 tablet 0  . Multiple Vitamins-Minerals (PRESERVISION AREDS) CAPS Take 1 capsule by mouth 2 (two) times daily.    . polyethylene glycol (MIRALAX / GLYCOLAX) packet Take 17 g by mouth daily. To prevent constipation     . QUEtiapine (SEROQUEL) 25 MG tablet Take 0.5 tablets (12.5 mg total) by mouth at bedtime. 15 tablet 0  . vitamin B-12 (CYANOCOBALAMIN) 1000 MCG tablet Take 1,000 mcg by mouth daily.     No facility-administered medications prior to visit.      Allergies:   Milk-related compounds, Lanoxin [digoxin], and Mexitil [mexiletine]   Social History   Socioeconomic History  . Marital status: Married    Spouse name: Inez Catalina  . Number of children: 2  . Years of education: 64  . Highest education level: Not on file  Occupational History    Comment: retired Art gallery manager  Social Needs  . Financial resource strain: Not on file  . Food insecurity    Worry: Not on file    Inability: Not on file  . Transportation needs    Medical: Not on file    Non-medical: Not on file  Tobacco Use  . Smoking status: Former Smoker    Types: Cigarettes, Cigars    Quit date: 05/14/1962    Years since quitting: 56.9  . Smokeless tobacco: Never Used  Substance and Sexual Activity  . Alcohol use: Yes    Comment: seldom  . Drug use: No  . Sexual activity: Not on file  Lifestyle  . Physical activity    Days per week: Not on file    Minutes per session: Not on file  . Stress: Not on file   Relationships  . Social Herbalist on phone: Not on file    Gets together: Not on file    Attends religious service: Not on file    Active member of club or organization: Not on file    Attends meetings of clubs or organizations: Not on file    Relationship status: Not on file  Other Topics Concern  . 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   ROS:   Please see the history of present illness.    ROS All other systems are reviewed and are negative.    PHYSICAL EXAM:   VS:  BP (!) 138/50 (BP Location: Left Arm, Patient Position: Sitting, Cuff Size: Normal)   Pulse 65   Temp (!) 97 F (36.1 C)   Ht 5\' 8"  (1.727 m)   Wt 174 lb (78.9 kg)   BMI 26.46 kg/m      General: Alert, oriented x3, no distress, appears comfortable.  Healthy with subclavian pacemaker site Head: no evidence of trauma, PERRL, EOMI, no exophtalmos or lid lag, no myxedema, no xanthelasma; normal ears, nose and oropharynx Neck: normal jugular venous pulsations and no hepatojugular reflux; brisk carotid pulses without delay and no carotid bruits Chest: clear to auscultation, no signs of consolidation by percussion or palpation, normal fremitus, symmetrical and full respiratory excursions Cardiovascular: normal position and quality of the apical impulse, regular rhythm, normal first and second heart sounds, no murmurs, rubs or gallops Abdomen: no tenderness or distention, no masses by palpation, no abnormal pulsatility or arterial bruits, normal bowel sounds, no hepatosplenomegaly Extremities: no clubbing, cyanosis or edema; 2+ radial, ulnar and brachial pulses bilaterally; 2+ right femoral, posterior tibial and dorsalis pedis pulses; 2+ left femoral, posterior tibial and dorsalis  pedis pulses; no subclavian or femoral bruits Neurological: grossly nonfocal, but he has slow mobility and very fine resting tremor Psych: Normal mood and affect     Wt Readings from Last 3 Encounters:  04/02/19 174 lb (78.9 kg)  03/05/19 180 lb 12.4 oz (82 kg)  02/22/19 180 lb (81.6 kg)      Studies/Labs Reviewed:   EKG:  EKG is not ordered today.  The tracing from February 22, 2019 shows background atrial fibrillation and ventricular paced rhythm with a very broad QRS Recent Labs: 02/22/2019: B Natriuretic Peptide 577.5 03/05/2019: ALT 17; BUN 63; Creatinine, Ser 2.16; Hemoglobin 9.5; Platelets 306; Potassium 4.4; Sodium 137  Most recent lipids 10/17/2018  Total cholesterol 107, triglycerides 109, LDL 56, HDL 30  ASSESSMENT:    1. CHB (complete heart block) (HCC)   2. Pacemaker dependent - Medtronic   3. NSVT (nonsustained ventricular tachycardia) ([place])   4. Permanent atrial fibrillation ([place])   5. Long term current use of anticoagulant   6. Coronary artery disease involving native coronary artery of native heart without angina pectoris   7. Chronic diastolic congestive heart failure ([place])   8. Dyslipidemia   9. Type 2 diabetes mellitus with stage 4 chronic kidney disease, without long-term current use of insulin (HCC)      PLAN:  In order of problems listed above:  1. CHB: Usually found to be pacemaker dependent when checked in the office. 2. PPM: Normal device function, he prefers lower rate limit at 60 bpm.  Continue remote downloads every 3 months has an MRI conditional system.  Can have MRI scans, but needs appropriate device reprogramming. 3. NSVT: None recorded recently.  Continue beta-blockers.  He did not tolerate mexiletine. 4. AFib: permanent atrial fibrillation.  Rate controlled not an issue due to AV block CHADSVasc 6 (age 50, CAD, CHF, HTN, DM).   5. Eliquis: No bleeding complications.  Dose adjusted for age and renal function. 6. CAD s/p CABG: Does not  have angina pectoris but is extremely sedentary..  Not receiving aspirin due to concomitant anticoagulation.  On statin. 7. CHF: Due to sedentary lifestyle, hard to assess functional status but  he appears to be clinically euvolemic.  On higher diuretic doses he had issues with orthostatic hypotension but now appears to be well compensated.  Note remarkably low diastolic blood pressure. 8. HLP on statin, LDL at target.  Low HDL is a chronic problem we have not been able to satisfactorily improve 9. DM: Good glycemic control with A1c of 7.4% in October (per his endocrinologist note, target under 8%, not a candidate for aggressive glycemic control) 10. CKD stage 4: Continues gradual worsening.  Creatinine peaked at 2.34 after his episode of GI bleeding, received IV fluids on October 26 and not sure if this has been rechecked  Medication Adjustments/Labs and Tests Ordered: Current medicines are reviewed at length with the patient today.  Concerns regarding medicines are outlined above.  Medication changes, Labs and Tests ordered today are listed in the Patient Instructions below. Patient Instructions  Medication Instructions:  No changes *If you need a refill on your cardiac medications before your next appointment, please call your pharmacy*  Lab Work: None ordered If you have labs (blood work) drawn today and your tests are completely normal, you will receive your results only by: Marland Kitchen MyChart Message (if you have MyChart) OR . A paper copy in the mail If you have any lab test that is abnormal or we need to change your treatment, we will call you to review the results.  Testing/Procedures: None ordered  Follow-Up: At [facility], you and your health needs are our priority.  As part of our continuing mission to provide you with exceptional heart care, we have created designated Provider Care Teams.  These Care Teams include your primary Cardiologist (physician) and Advanced Practice Providers  (APPs -  Physician Assistants and Nurse Practitioners) who all work together to provide you with the care you need, when you need it.  Your next appointment:   12 month(s)  The format for your next appointment:   In Person  Provider:   [name], MD      Signed, [name], MD  04/04/2019 1:00 PM    [address], [place], [place]  [address] Phone: [phone]; Fax: [phone]

## 2019-04-02 NOTE — Patient Instructions (Signed)

## 2019-04-06 ENCOUNTER — Other Ambulatory Visit: Payer: Self-pay | Admitting: Cardiovascular Disease

## 2019-04-06 DIAGNOSIS — R269 Unspecified abnormalities of gait and mobility: Secondary | ICD-10-CM | POA: Diagnosis not present

## 2019-04-06 DIAGNOSIS — E119 Type 2 diabetes mellitus without complications: Secondary | ICD-10-CM | POA: Diagnosis not present

## 2019-04-06 DIAGNOSIS — Z950 Presence of cardiac pacemaker: Secondary | ICD-10-CM | POA: Diagnosis not present

## 2019-04-06 DIAGNOSIS — M6281 Muscle weakness (generalized): Secondary | ICD-10-CM | POA: Diagnosis not present

## 2019-04-06 DIAGNOSIS — I10 Essential (primary) hypertension: Secondary | ICD-10-CM | POA: Diagnosis not present

## 2019-04-06 DIAGNOSIS — G20 Parkinson's disease: Secondary | ICD-10-CM | POA: Diagnosis not present

## 2019-04-06 DIAGNOSIS — E1140 Type 2 diabetes mellitus with diabetic neuropathy, unspecified: Secondary | ICD-10-CM | POA: Diagnosis not present

## 2019-04-06 DIAGNOSIS — I509 Heart failure, unspecified: Secondary | ICD-10-CM | POA: Diagnosis not present

## 2019-04-06 DIAGNOSIS — I2510 Atherosclerotic heart disease of native coronary artery without angina pectoris: Secondary | ICD-10-CM | POA: Diagnosis not present

## 2019-04-09 ENCOUNTER — Inpatient Hospital Stay (HOSPITAL_COMMUNITY)
Admission: EM | Admit: 2019-04-09 | Discharge: 2019-04-13 | DRG: 177 | Disposition: A | Discharge status: Home Health | Payer: Medicare HMO | Attending: Internal Medicine | Admitting: Internal Medicine

## 2019-04-09 ENCOUNTER — Other Ambulatory Visit: Payer: Self-pay

## 2019-04-09 ENCOUNTER — Emergency Department (HOSPITAL_COMMUNITY): Payer: Medicare HMO

## 2019-04-09 ENCOUNTER — Encounter (HOSPITAL_COMMUNITY): Payer: Self-pay

## 2019-04-09 ENCOUNTER — Ambulatory Visit (INDEPENDENT_AMBULATORY_CARE_PROVIDER_SITE_OTHER): Payer: Medicare HMO | Admitting: *Deleted

## 2019-04-09 DIAGNOSIS — Z7984 Long term (current) use of oral hypoglycemic drugs: Secondary | ICD-10-CM | POA: Diagnosis not present

## 2019-04-09 DIAGNOSIS — G9340 Encephalopathy, unspecified: Secondary | ICD-10-CM | POA: Diagnosis present

## 2019-04-09 DIAGNOSIS — N1832 Chronic kidney disease, stage 3b: Secondary | ICD-10-CM | POA: Diagnosis not present

## 2019-04-09 DIAGNOSIS — R531 Weakness: Secondary | ICD-10-CM | POA: Diagnosis not present

## 2019-04-09 DIAGNOSIS — Z20828 Contact with and (suspected) exposure to other viral communicable diseases: Secondary | ICD-10-CM | POA: Diagnosis not present

## 2019-04-09 DIAGNOSIS — E785 Hyperlipidemia, unspecified: Secondary | ICD-10-CM | POA: Diagnosis present

## 2019-04-09 DIAGNOSIS — Z8546 Personal history of malignant neoplasm of prostate: Secondary | ICD-10-CM | POA: Diagnosis not present

## 2019-04-09 DIAGNOSIS — G20 Parkinson's disease: Secondary | ICD-10-CM | POA: Diagnosis not present

## 2019-04-09 DIAGNOSIS — Z950 Presence of cardiac pacemaker: Secondary | ICD-10-CM

## 2019-04-09 DIAGNOSIS — Z8701 Personal history of pneumonia (recurrent): Secondary | ICD-10-CM

## 2019-04-09 DIAGNOSIS — Z7901 Long term (current) use of anticoagulants: Secondary | ICD-10-CM

## 2019-04-09 DIAGNOSIS — Z7401 Bed confinement status: Secondary | ICD-10-CM | POA: Diagnosis not present

## 2019-04-09 DIAGNOSIS — R404 Transient alteration of awareness: Secondary | ICD-10-CM | POA: Diagnosis not present

## 2019-04-09 DIAGNOSIS — R918 Other nonspecific abnormal finding of lung field: Secondary | ICD-10-CM | POA: Diagnosis not present

## 2019-04-09 DIAGNOSIS — I442 Atrioventricular block, complete: Secondary | ICD-10-CM | POA: Diagnosis not present

## 2019-04-09 DIAGNOSIS — I10 Essential (primary) hypertension: Secondary | ICD-10-CM | POA: Diagnosis present

## 2019-04-09 DIAGNOSIS — N184 Chronic kidney disease, stage 4 (severe): Secondary | ICD-10-CM

## 2019-04-09 DIAGNOSIS — N183 Chronic kidney disease, stage 3 unspecified: Secondary | ICD-10-CM | POA: Diagnosis not present

## 2019-04-09 DIAGNOSIS — I4821 Permanent atrial fibrillation: Secondary | ICD-10-CM

## 2019-04-09 DIAGNOSIS — I4892 Unspecified atrial flutter: Secondary | ICD-10-CM | POA: Diagnosis present

## 2019-04-09 DIAGNOSIS — D539 Nutritional anemia, unspecified: Secondary | ICD-10-CM | POA: Diagnosis not present

## 2019-04-09 DIAGNOSIS — F0280 Dementia in other diseases classified elsewhere without behavioral disturbance: Secondary | ICD-10-CM | POA: Diagnosis present

## 2019-04-09 DIAGNOSIS — E1165 Type 2 diabetes mellitus with hyperglycemia: Secondary | ICD-10-CM | POA: Diagnosis not present

## 2019-04-09 DIAGNOSIS — I2510 Atherosclerotic heart disease of native coronary artery without angina pectoris: Secondary | ICD-10-CM | POA: Diagnosis present

## 2019-04-09 DIAGNOSIS — J181 Lobar pneumonia, unspecified organism: Secondary | ICD-10-CM | POA: Diagnosis not present

## 2019-04-09 DIAGNOSIS — J9601 Acute respiratory failure with hypoxia: Secondary | ICD-10-CM | POA: Diagnosis not present

## 2019-04-09 DIAGNOSIS — J690 Pneumonitis due to inhalation of food and vomit: Principal | ICD-10-CM | POA: Diagnosis present

## 2019-04-09 DIAGNOSIS — I5033 Acute on chronic diastolic (congestive) heart failure: Secondary | ICD-10-CM | POA: Diagnosis present

## 2019-04-09 DIAGNOSIS — E1122 Type 2 diabetes mellitus with diabetic chronic kidney disease: Secondary | ICD-10-CM | POA: Diagnosis present

## 2019-04-09 DIAGNOSIS — R402411 Glasgow coma scale score 13-15, in the field [EMT or ambulance]: Secondary | ICD-10-CM | POA: Diagnosis not present

## 2019-04-09 DIAGNOSIS — Z951 Presence of aortocoronary bypass graft: Secondary | ICD-10-CM | POA: Diagnosis not present

## 2019-04-09 DIAGNOSIS — Z79899 Other long term (current) drug therapy: Secondary | ICD-10-CM | POA: Diagnosis not present

## 2019-04-09 DIAGNOSIS — E119 Type 2 diabetes mellitus without complications: Secondary | ICD-10-CM

## 2019-04-09 DIAGNOSIS — R5381 Other malaise: Secondary | ICD-10-CM | POA: Diagnosis not present

## 2019-04-09 DIAGNOSIS — I472 Ventricular tachycardia: Secondary | ICD-10-CM | POA: Diagnosis not present

## 2019-04-09 DIAGNOSIS — I4891 Unspecified atrial fibrillation: Secondary | ICD-10-CM | POA: Diagnosis present

## 2019-04-09 DIAGNOSIS — R410 Disorientation, unspecified: Secondary | ICD-10-CM | POA: Diagnosis not present

## 2019-04-09 DIAGNOSIS — Z9079 Acquired absence of other genital organ(s): Secondary | ICD-10-CM

## 2019-04-09 DIAGNOSIS — Z87891 Personal history of nicotine dependence: Secondary | ICD-10-CM | POA: Diagnosis not present

## 2019-04-09 DIAGNOSIS — Z888 Allergy status to other drugs, medicaments and biological substances status: Secondary | ICD-10-CM

## 2019-04-09 DIAGNOSIS — M2550 Pain in unspecified joint: Secondary | ICD-10-CM | POA: Diagnosis not present

## 2019-04-09 DIAGNOSIS — J189 Pneumonia, unspecified organism: Secondary | ICD-10-CM | POA: Diagnosis present

## 2019-04-09 DIAGNOSIS — I5032 Chronic diastolic (congestive) heart failure: Secondary | ICD-10-CM | POA: Diagnosis present

## 2019-04-09 DIAGNOSIS — Z91011 Allergy to milk products: Secondary | ICD-10-CM

## 2019-04-09 DIAGNOSIS — Z9981 Dependence on supplemental oxygen: Secondary | ICD-10-CM | POA: Diagnosis not present

## 2019-04-09 DIAGNOSIS — R0902 Hypoxemia: Secondary | ICD-10-CM | POA: Diagnosis not present

## 2019-04-09 DIAGNOSIS — R509 Fever, unspecified: Secondary | ICD-10-CM | POA: Diagnosis not present

## 2019-04-09 DIAGNOSIS — G20A1 Parkinson's disease without dyskinesia, without mention of fluctuations: Secondary | ICD-10-CM | POA: Diagnosis present

## 2019-04-09 DIAGNOSIS — I130 Hypertensive heart and chronic kidney disease with heart failure and stage 1 through stage 4 chronic kidney disease, or unspecified chronic kidney disease: Secondary | ICD-10-CM | POA: Diagnosis present

## 2019-04-09 DIAGNOSIS — I959 Hypotension, unspecified: Secondary | ICD-10-CM | POA: Diagnosis not present

## 2019-04-09 DIAGNOSIS — R4182 Altered mental status, unspecified: Secondary | ICD-10-CM | POA: Diagnosis not present

## 2019-04-09 DIAGNOSIS — I4510 Unspecified right bundle-branch block: Secondary | ICD-10-CM | POA: Diagnosis not present

## 2019-04-09 DIAGNOSIS — Z8249 Family history of ischemic heart disease and other diseases of the circulatory system: Secondary | ICD-10-CM

## 2019-04-09 LAB — CBC WITH DIFFERENTIAL/PLATELET
Abs Immature Granulocytes: 0.19 10*3/uL — ABNORMAL HIGH (ref 0.00–0.07)
Basophils Absolute: 0 10*3/uL (ref 0.0–0.1)
Basophils Relative: 0 %
Eosinophils Absolute: 0.1 10*3/uL (ref 0.0–0.5)
Eosinophils Relative: 1 %
HCT: 27 % — ABNORMAL LOW (ref 39.0–52.0)
Hemoglobin: 8.1 g/dL — ABNORMAL LOW (ref 13.0–17.0)
Immature Granulocytes: 2 %
Lymphocytes Relative: 15 %
Lymphs Abs: 1.7 10*3/uL (ref 0.7–4.0)
MCH: 33.8 pg (ref 26.0–34.0)
MCHC: 30 g/dL (ref 30.0–36.0)
MCV: 112.5 fL — ABNORMAL HIGH (ref 80.0–100.0)
Monocytes Absolute: 1.7 10*3/uL — ABNORMAL HIGH (ref 0.1–1.0)
Monocytes Relative: 16 %
Neutro Abs: 7.3 10*3/uL (ref 1.7–7.7)
Neutrophils Relative %: 66 %
Platelets: 264 10*3/uL (ref 150–400)
RBC: 2.4 MIL/uL — ABNORMAL LOW (ref 4.22–5.81)
RDW: 17.3 % — ABNORMAL HIGH (ref 11.5–15.5)
WBC: 11 10*3/uL — ABNORMAL HIGH (ref 4.0–10.5)
nRBC: 0 % (ref 0.0–0.2)

## 2019-04-09 LAB — COMPREHENSIVE METABOLIC PANEL
ALT: 10 U/L (ref 0–44)
AST: 15 U/L (ref 15–41)
Albumin: 3.1 g/dL — ABNORMAL LOW (ref 3.5–5.0)
Alkaline Phosphatase: 113 U/L (ref 38–126)
Anion gap: 9 (ref 5–15)
BUN: 44 mg/dL — ABNORMAL HIGH (ref 8–23)
CO2: 25 mmol/L (ref 22–32)
Calcium: 8.3 mg/dL — ABNORMAL LOW (ref 8.9–10.3)
Chloride: 102 mmol/L (ref 98–111)
Creatinine, Ser: 1.99 mg/dL — ABNORMAL HIGH (ref 0.61–1.24)
GFR calc Af Amer: 32 mL/min — ABNORMAL LOW (ref >60)
GFR calc non Af Amer: 28 mL/min — ABNORMAL LOW (ref >60)
Glucose, Bld: 188 mg/dL — ABNORMAL HIGH (ref 70–99)
Potassium: 4.2 mmol/L (ref 3.5–5.1)
Sodium: 136 mmol/L (ref 135–145)
Total Bilirubin: 0.8 mg/dL (ref 0.3–1.2)
Total Protein: 6.9 g/dL (ref 6.5–8.1)

## 2019-04-09 LAB — CUP PACEART REMOTE DEVICE CHECK
Battery Remaining Longevity: 113 mo
Battery Voltage: 3.01 V
Brady Statistic AP VP Percent: 0 %
Brady Statistic AP VS Percent: 0 %
Brady Statistic AS VP Percent: 95.51 %
Brady Statistic AS VS Percent: 4.49 %
Brady Statistic RA Percent Paced: 0 %
Brady Statistic RV Percent Paced: 95.51 %
Date Time Interrogation Session: 20201130003907
Implantable Lead Implant Date: 20080804
Implantable Lead Implant Date: 20080804
Implantable Lead Location: 753859
Implantable Lead Location: 753860
Implantable Lead Model: 5076
Implantable Lead Model: 5076
Implantable Pulse Generator Implant Date: 20180926
Lead Channel Impedance Value: 323 Ohm
Lead Channel Impedance Value: 342 Ohm
Lead Channel Impedance Value: 361 Ohm
Lead Channel Impedance Value: 418 Ohm
Lead Channel Pacing Threshold Amplitude: 0.75 V
Lead Channel Pacing Threshold Pulse Width: 0.4 ms
Lead Channel Sensing Intrinsic Amplitude: 8.125 mV
Lead Channel Sensing Intrinsic Amplitude: 8.125 mV
Lead Channel Setting Pacing Amplitude: 2.5 V
Lead Channel Setting Pacing Pulse Width: 0.4 ms
Lead Channel Setting Sensing Sensitivity: 4 mV

## 2019-04-09 LAB — PROCALCITONIN: Procalcitonin: <0.1 ng/mL

## 2019-04-09 LAB — TROPONIN I (HIGH SENSITIVITY)
Troponin I (High Sensitivity): 36 ng/L — ABNORMAL HIGH (ref <18)
Troponin I (High Sensitivity): 39 ng/L — ABNORMAL HIGH (ref <18)

## 2019-04-09 LAB — LACTIC ACID, PLASMA: Lactic Acid, Venous: 0.9 mmol/L (ref 0.5–1.9)

## 2019-04-09 LAB — CBG MONITORING, ED: Glucose-Capillary: 186 mg/dL — ABNORMAL HIGH (ref 70–99)

## 2019-04-09 LAB — POC SARS CORONAVIRUS 2 AG -  ED: SARS Coronavirus 2 Ag: NEGATIVE

## 2019-04-09 LAB — BRAIN NATRIURETIC PEPTIDE: B Natriuretic Peptide: 565.8 pg/mL — ABNORMAL HIGH (ref 0.0–100.0)

## 2019-04-09 MED ORDER — VITAMIN B-12 1000 MCG PO TABS
1000.0000 ug | ORAL_TABLET | Freq: Every day | ORAL | Status: DC
  Administered 2019-04-10 – 2019-04-13 (×4): 1000 ug via ORAL
  Filled 2019-04-09 (×4): qty 1

## 2019-04-09 MED ORDER — FUROSEMIDE 10 MG/ML IJ SOLN
40.0000 mg | Freq: Once | INTRAMUSCULAR | Status: AC
  Administered 2019-04-09: 40 mg via INTRAVENOUS
  Filled 2019-04-09: qty 4

## 2019-04-09 MED ORDER — QUETIAPINE FUMARATE 25 MG PO TABS
12.5000 mg | ORAL_TABLET | Freq: Every day | ORAL | Status: DC
  Administered 2019-04-10 – 2019-04-12 (×4): 12.5 mg via ORAL
  Filled 2019-04-09 (×4): qty 1

## 2019-04-09 MED ORDER — METOPROLOL TARTRATE 50 MG PO TABS
50.0000 mg | ORAL_TABLET | Freq: Every day | ORAL | Status: DC
  Administered 2019-04-12: 50 mg via ORAL
  Filled 2019-04-09 (×2): qty 1

## 2019-04-09 MED ORDER — INSULIN ASPART 100 UNIT/ML ~~LOC~~ SOLN
0.0000 [IU] | Freq: Every day | SUBCUTANEOUS | Status: DC
  Filled 2019-04-09: qty 0.05

## 2019-04-09 MED ORDER — METOPROLOL TARTRATE 50 MG PO TABS
100.0000 mg | ORAL_TABLET | Freq: Every day | ORAL | Status: DC
  Administered 2019-04-10 – 2019-04-13 (×4): 100 mg via ORAL
  Filled 2019-04-09 (×4): qty 2

## 2019-04-09 MED ORDER — CARBIDOPA-LEVODOPA 25-100 MG PO TABS
1.0000 | ORAL_TABLET | Freq: Three times a day (TID) | ORAL | Status: DC
  Administered 2019-04-10 – 2019-04-13 (×10): 1 via ORAL
  Filled 2019-04-09 (×11): qty 1

## 2019-04-09 MED ORDER — SODIUM CHLORIDE 0.9 % IV SOLN
500.0000 mg | INTRAVENOUS | Status: DC
  Administered 2019-04-10 – 2019-04-12 (×4): 500 mg via INTRAVENOUS
  Filled 2019-04-09 (×5): qty 500

## 2019-04-09 MED ORDER — FUROSEMIDE 40 MG PO TABS
40.0000 mg | ORAL_TABLET | Freq: Every day | ORAL | Status: DC
  Administered 2019-04-10: 40 mg via ORAL
  Filled 2019-04-09: qty 1

## 2019-04-09 MED ORDER — ATORVASTATIN CALCIUM 20 MG PO TABS
20.0000 mg | ORAL_TABLET | Freq: Every day | ORAL | Status: DC
  Administered 2019-04-10 – 2019-04-13 (×4): 20 mg via ORAL
  Filled 2019-04-09 (×4): qty 1

## 2019-04-09 MED ORDER — APIXABAN 2.5 MG PO TABS
2.5000 mg | ORAL_TABLET | Freq: Two times a day (BID) | ORAL | Status: DC
  Administered 2019-04-10 – 2019-04-13 (×8): 2.5 mg via ORAL
  Filled 2019-04-09 (×9): qty 1

## 2019-04-09 MED ORDER — POLYETHYLENE GLYCOL 3350 17 G PO PACK
17.0000 g | PACK | Freq: Every day | ORAL | Status: DC
  Administered 2019-04-10 – 2019-04-13 (×3): 17 g via ORAL
  Filled 2019-04-09 (×4): qty 1

## 2019-04-09 MED ORDER — SODIUM CHLORIDE 0.9 % IV SOLN
2.0000 g | INTRAVENOUS | Status: DC
  Administered 2019-04-10 – 2019-04-13 (×4): 2 g via INTRAVENOUS
  Filled 2019-04-09: qty 2
  Filled 2019-04-09: qty 20
  Filled 2019-04-09 (×3): qty 2

## 2019-04-09 MED ORDER — VANCOMYCIN HCL IN DEXTROSE 1-5 GM/200ML-% IV SOLN
1000.0000 mg | Freq: Once | INTRAVENOUS | Status: AC
  Administered 2019-04-09: 23:00:00 1000 mg via INTRAVENOUS
  Filled 2019-04-09: qty 200

## 2019-04-09 MED ORDER — PIPERACILLIN-TAZOBACTAM 3.375 G IVPB 30 MIN
3.3750 g | Freq: Once | INTRAVENOUS | Status: AC
  Administered 2019-04-09: 3.375 g via INTRAVENOUS
  Filled 2019-04-09: qty 50

## 2019-04-09 MED ORDER — PROSIGHT PO TABS
1.0000 | ORAL_TABLET | Freq: Two times a day (BID) | ORAL | Status: DC
  Administered 2019-04-10 – 2019-04-13 (×7): 1 via ORAL
  Filled 2019-04-09 (×7): qty 1

## 2019-04-09 MED ORDER — INSULIN ASPART 100 UNIT/ML ~~LOC~~ SOLN
0.0000 [IU] | Freq: Three times a day (TID) | SUBCUTANEOUS | Status: DC
  Administered 2019-04-10: 3 [IU] via SUBCUTANEOUS
  Administered 2019-04-10 – 2019-04-11 (×3): 2 [IU] via SUBCUTANEOUS
  Administered 2019-04-12 (×2): 3 [IU] via SUBCUTANEOUS
  Administered 2019-04-13: 2 [IU] via SUBCUTANEOUS
  Filled 2019-04-09: qty 0.15

## 2019-04-09 NOTE — H&P (Signed)
History and Physical    [name] Powell PJA:[id number] DOB: 08-Jun-1922 DOA: 04/09/2019  PCP: [name], MD  Patient coming from: ALF  I have personally briefly reviewed patient's old medical records in [place]  Chief Complaint: Cough, AMS, fever  HPI: [name] Powell is a 85 y.o. male with medical history significant of HTN, DM2, A.Fib on eliquis, CHB s/p PPM, parkinson's.  Patient was admitted in October for multifocal CAP (COVID and influenza were negative), as well as acute on chronic diastolic CHF.  He improved with CAP ABx as well as diuresis.  Today patient brought in to ED by EMS due to generalized weakness, inability to stand for past couple of days.  Has had cough and low grade intermittent fever per wife.  Nothing makes symptoms better or worse, cough non-productive and dry.   ED Course: BNP 565 (was 577 in Oct), WBC 11k, Creat 1.99 (baseline), HGB 8.1 (9 range in oct) with macrocytosis.   CXR shows multifocal PNA.  Review of Systems: As per HPI, otherwise all review of systems negative.  Past Medical History:  Diagnosis Date  . Anemia    chronic  . Atrial fibrillation ([place])   . Cancer ([place])   . CHB (complete heart block) ([place])   . Coronary artery disease   . Diabetes mellitus   . Dyslipidemia   . H/O prostate cancer   . Hypertension   . New onset atrial flutter, persistent 07/04/2014  . Pacemaker 12/12/2006   Medtronic adapta  . Pleural effusion, left   . Pneumonia 09/2015  . Prostate cancer ([place])   . 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 Ventricular tachycardia (paroxysmal) ([place]) 03/28/2014    Past Surgical History:  Procedure Laterality Date  . CORONARY ARTERY BYPASS GRAFT  09/04/2001   LIMA to LAD,SVG to diagonal,SVG to ramus intermedius,SVG to PDA  . NM MYOVIEW LTD  01/09/2010   no ischemia  . PACEMAKER INSERTION  12/12/2006   Medtronic adapta  . PPM GENERATOR CHANGEOUT N/A 02/02/2017    Procedure: PPM GENERATOR CHANGEOUT - DUAL CHAMBER;  Surgeon: [name], MD;  Location: [facility];  Service: Cardiovascular;  Laterality: N/A;  . PROSTATE SURGERY  2001   cancer  . TONSILLECTOMY       reports that he quit smoking about 56 years ago. His smoking use included cigarettes and cigars. He has never used smokeless tobacco. He reports current alcohol use. He reports that he does not use drugs.  Allergies  Allergen Reactions  . Milk-Related Compounds Cough  . Lanoxin [Digoxin] Rash    Eyelid rash  . Mexitil [Mexiletine] Other (See Comments)    unknown    Family History  Problem Relation Age of Onset  . Heart disease Mother   . Heart attack Mother   . Other Father        sepsis  . Other Sister        brain tumor     Prior to Admission medications   Medication Sig Start Date End Date Taking? Authorizing Provider  atorvastatin (LIPITOR) 20 MG tablet Take 20 mg by mouth daily.   Yes [provider]  carbidopa-levodopa (SINEMET IR) 25-100 MG tablet Take 1 tablet by mouth 3 (three) times daily. 01/03/19  Yes Penumalli, Earlean Polka, MD  Cholecalciferol (VITAMIN D PO) Take 10,000 Units by mouth every Friday. On Friday    Yes [provider]  ELIQUIS 2.5 MG TABS tablet TAKE 1 TABLET TWICE DAILY Patient taking differently: Take 2.5 mg by mouth 2 (two) times daily.  03/09/19  Yes Croitoru, Mihai, MD  furosemide (LASIX) 40 MG tablet Take 40 mg by mouth daily.   Yes [provider]  glimepiride (AMARYL) 4 MG tablet Take 4 mg by mouth daily before breakfast.   Yes [provider]  JANUVIA 50 MG tablet Take 1 tablet by mouth every evening.  05/27/15  Yes [provider]  metoprolol tartrate (LOPRESSOR) 50 MG tablet TAKE 2 TABLETS IN THE MORNING  AND TAKE 1 TABLET IN THE EVENING Patient taking differently: Take 50-100 mg by mouth See admin instructions. 100mg  in am & 50mg  in evening. 03/09/19  Yes Croitoru, Mihai, MD  Multiple  Vitamins-Minerals (PRESERVISION AREDS) CAPS Take 1 capsule by mouth 2 (two) times daily.   Yes [provider]  polyethylene glycol (MIRALAX / GLYCOLAX) packet Take 17 g by mouth daily. To prevent constipation    Yes [provider]  QUEtiapine (SEROQUEL) 25 MG tablet Take 0.5 tablets (12.5 mg total) by mouth at bedtime. 03/01/19 04/09/19 Yes Donne Hazel, MD  vitamin B-12 (CYANOCOBALAMIN) 1000 MCG tablet Take 1,000 mcg by mouth daily.   Yes [provider]    Physical Exam: Vitals:   04/09/19 1757 04/09/19 2030 04/09/19 2100 04/09/19 2143  BP: (!) 144/61 (!) 149/63 (!) 146/72 (!) 140/54  Pulse: 60 64 66 (!) 59  Resp: 18 18  (!) 21  Temp: 98.9 F (37.2 C)     TempSrc: Oral     SpO2: 92% 93% 96% 92%    Constitutional: NAD, calm, comfortable Eyes: PERRL, lids and conjunctivae normal ENMT: Mucous membranes are moist. Posterior pharynx clear of any exudate or lesions.Normal dentition.  Neck: normal, supple, no masses, no thyromegaly Respiratory: clear to auscultation bilaterally, no wheezing, no crackles. Normal respiratory effort. No accessory muscle use.  Cardiovascular: Regular rate and rhythm, no murmurs / rubs / gallops. 3+ BLE pitting edema. 2+ pedal pulses. No carotid bruits.  Abdomen: no tenderness, no masses palpated. No hepatosplenomegaly. Bowel sounds positive.  Musculoskeletal: no clubbing / cyanosis. No joint deformity upper and lower extremities. Good ROM, no contractures. Normal muscle tone.  Skin: no rashes, lesions, ulcers. No induration Neurologic: CN 2-12 grossly intact. Sensation intact, DTR normal. Strength 5/5 in all 4.  Psychiatric: Normal judgment and insight. Alert and oriented x 3. Normal mood.    Labs on Admission: I have personally reviewed following labs and imaging studies  CBC: Recent Labs  Lab 04/09/19 1846  WBC 11.0*  NEUTROABS 7.3  HGB 8.1*  HCT 27.0*  MCV 112.5*  PLT 793   Basic Metabolic Panel: Recent Labs   Lab 04/09/19 1846  NA 136  K 4.2  CL 102  CO2 25  GLUCOSE 188*  BUN 44*  CREATININE 1.99*  CALCIUM 8.3*   GFR: Estimated Creatinine Clearance: 22 mL/min (A) (by C-G formula based on SCr of 1.99 mg/dL (H)). Liver Function Tests: Recent Labs  Lab 04/09/19 1846  AST 15  ALT 10  ALKPHOS 113  BILITOT 0.8  PROT 6.9  ALBUMIN 3.1*   No results for input(s): LIPASE, AMYLASE in the last 168 hours. No results for input(s): AMMONIA in the last 168 hours. Coagulation Profile: No results for input(s): INR, PROTIME in the last 168 hours. Cardiac Enzymes: No results for input(s): CKTOTAL, CKMB, CKMBINDEX, TROPONINI in the last 168 hours. BNP (last 3 results) No results for  input(s): PROBNP in the last 8760 hours. HbA1C: No results for input(s): HGBA1C in the last 72 hours. CBG: No results for input(s): GLUCAP in the last 168 hours. Lipid Profile: No results for input(s): CHOL, HDL, LDLCALC, TRIG, CHOLHDL, LDLDIRECT in the last 72 hours. Thyroid Function Tests: No results for input(s): TSH, T4TOTAL, FREET4, T3FREE, THYROIDAB in the last 72 hours. Anemia Panel: No results for input(s): VITAMINB12, FOLATE, FERRITIN, TIBC, IRON, RETICCTPCT in the last 72 hours. Urine analysis:    Component Value Date/Time   COLORURINE STRAW (A) 03/05/2019 2225   APPEARANCEUR CLEAR 03/05/2019 2225   LABSPEC 1.012 03/05/2019 2225   PHURINE 7.0 03/05/2019 2225   GLUCOSEU NEGATIVE 03/05/2019 2225   HGBUR NEGATIVE 03/05/2019 2225   BILIRUBINUR NEGATIVE 03/05/2019 2225   KETONESUR NEGATIVE 03/05/2019 2225   PROTEINUR 30 (A) 03/05/2019 2225   UROBILINOGEN 1.0 02/19/2015 2354   NITRITE NEGATIVE 03/05/2019 2225   LEUKOCYTESUR NEGATIVE 03/05/2019 2225    Radiological Exams on Admission: Dg Chest Port 1 View  Result Date: 04/09/2019 CLINICAL DATA:  85 year old male with weakness. EXAM: PORTABLE CHEST 1 VIEW COMPARISON:  Chest radiograph dated 03/05/2019 FINDINGS: Bilateral mid to lower lung field  opacities most concerning for pneumonia. Clinical correlation and follow-up to resolution recommended. There is no large pleural effusion. No pneumothorax. Stable mild cardiomegaly. Median sternotomy wires and CABG vascular clips. Left pectoral pacemaker device. No acute osseous pathology. IMPRESSION: Bilateral mid to lower lung field opacities most concerning for pneumonia. Clinical correlation and follow-up to resolution recommended. Electronically Signed   By: Anner Crete M.D.   On: 04/09/2019 18:49    EKG: Independently reviewed.  Assessment/Plan Principal Problem:   CAP (community acquired pneumonia) Active Problems:   DM2 (diabetes mellitus, type 2) ([place])   HTN (hypertension)   Chronic diastolic congestive heart failure (HCC)   Atrial fibrillation (HCC)   CKD (chronic kidney disease), stage III (HCC)   Parkinson's disease ([place])   Acute encephalopathy   Macrocytic anemia    1. Acute encephalopathy - secondary to CAP vs acute on chronic diastolic CHF 1. CAP pathway 2. Rocephin / azithro 3. Check procalcitonin 4. Pt also with clinical evidence of fluid overload (peripheral edema) 5. Giving dose of IV lasix tonight 6. Continue home PO lasix daily 7. Strict intake and output 8. Tele monitor 9. Repeat BMP in AM 2. A.Fib - 1. Cont eliquis 2. Cont BB 3. HTN - 1. Cont home BP meds 4. DM2 - 1. Hold home PO hypoglycemics 2. Mod scale SSI AC/HS 5. Parkinson's 1. Cont Sinemet 2. Getting SLP eval to make sure he isnt aspirating with 2 admits for similar symptoms in 2 months now 6. Macrocytic anemia - 1. Check folate and thiamine levels 2. Folate and B12 were nl a couple of months ago though. 3. Repeat CBC in AM. 7. CKD stage 3 - chronic and stable  DVT prophylaxis: Eliquis Code Status: Full Family Communication: No family in room Disposition Plan: Home after admit Consults called: None Admission status: Admit to inpatient  Severity of Illness: The appropriate  patient status for this patient is INPATIENT. Inpatient status is judged to be reasonable and necessary in order to provide the required intensity of service to ensure the patient's safety. The patient's presenting symptoms, physical exam findings, and initial radiographic and laboratory data in the context of their chronic comorbidities is felt to place them at high risk for further clinical deterioration. Furthermore, it is not anticipated that the patient will be medically stable  for discharge from the hospital within 2 midnights of admission. The following factors support the patient status of inpatient.   IP status due to AMS secondary to CAP, possibly with acute on chronic diastolic CHF as well.   * I certify that at the point of admission it is my clinical judgment that the patient will require inpatient hospital care spanning beyond 2 midnights from the point of admission due to high intensity of service, high risk for further deterioration and high frequency of surveillance required.*    Safira Proffit M. DO [facility]  How to contact the [facility] Attending or Consulting provider [place] or covering provider during after hours [place], for this patient?  1. Check the care team in [facility] and look for a) attending/consulting [facility] provider listed and b) the [facility] team listed 2. Log into [email or web address]  Amion Physician Scheduling and messaging for groups and whole hospitals  On call and physician scheduling software for group practices, residents, hospitalists and other medical providers for call, clinic, rotation and shift schedules. On[employer] is a hospital-wide system for scheduling doctors and paging doctors on call. EasyPlot is for scientific plotting and data analysis.  [email or web address]  and use [place]'s universal password to access. If you do not have the password, please contact the hospital operator.  3. Locate the [facility] provider you are looking for under [facility] and  page to a number that you can be directly reached. 4. If you still have difficulty reaching the provider, please page the [facility] (Director on Call) for the Hospitalists listed on amion for assistance.  04/09/2019, 10:13 PM

## 2019-04-09 NOTE — ED Triage Notes (Signed)
EMS reports from home, called for Pt "sick". Hx of dementia and Parkinsons, on arrival EMS reports AMS beyond baseline per wife. States Pt recently finished PNA meds, Dx 3 weeks ago per wife.  BP 159/83 HR 85 RR 18 Sp02 90 RA   18ga LAC

## 2019-04-09 NOTE — ED Provider Notes (Addendum)
[name]   CSN: [id number] Arrival date & time: 04/09/19  1738     History   Chief Complaint Chief Complaint  Patient presents with  . Altered Mental Status    HPI [name] Powell is a 85 y.o. male.     Patient was brought to the emergency department because of weakness and inability to stand last couple days.  He has had a cough and a fever  The history is provided by the patient. No language interpreter was used.  Cough Cough characteristics:  Dry Sputum characteristics:  Nondescript Severity:  Mild Onset quality:  Sudden Timing:  Constant Progression:  Waxing and waning Chronicity:  New Smoker: no   Context: not animal exposure   Relieved by:  Nothing Worsened by:  Nothing   Past Medical History:  Diagnosis Date  . Anemia    chronic  . Atrial fibrillation ([place])   . Cancer ([place])   . CHB (complete heart block) ([place])   . Coronary artery disease   . Diabetes mellitus   . Dyslipidemia   . H/O prostate cancer   . Hypertension   . New onset atrial flutter, persistent 07/04/2014  . Pacemaker 12/12/2006   Medtronic adapta  . Pleural effusion, left   . Pneumonia 09/2015  . Prostate cancer ([place])   . 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 Ventricular tachycardia (paroxysmal) ([place]) 03/28/2014    Patient Active Problem List   Diagnosis Date Noted  . Macrocytic anemia 04/09/2019  . CAP (community acquired pneumonia) 02/23/2019  . Parkinson's disease ([place]) 02/22/2019  . Acute encephalopathy 02/22/2019  . Symptomatic anemia 10/23/2018  . Pacemaker battery depletion 02/02/2017  . CHF (congestive heart failure) ([place]) 07/27/2016  . Weakness generalized 07/27/2016  . Generalized weakness 07/27/2016  . Long term current use of anticoagulant 01/16/2016  . Acute respiratory failure with hypoxia ([place]) 09/15/2015  . Acute on chronic diastolic congestive heart  failure ([place]) 09/15/2015  . CKD (chronic kidney disease), stage III ([place]) 09/15/2015  . Atrial fibrillation ([place])   . Pleural effusion, left   . Acute diastolic (congestive) heart failure ([place])   . Community acquired pneumonia 02/19/2015  . Atrial flutter ([place]) 01/20/2015  . New onset atrial flutter, persistent 07/04/2014  . Ventricular tachycardia (paroxysmal) ([place]) 03/28/2014  . Chronic diastolic congestive heart failure ([place]) 01/02/2014  . Pacemaker dependent - Medtronic 01/10/2013  . CAD s/p CABG 2003 01/10/2013  . Sinus arrest 01/10/2013  . CHB (complete heart block) ([place]) 01/10/2013  . DM2 (diabetes mellitus, type 2) ([place]) 01/10/2013  . Dyslipidemia 01/10/2013  . HTN (hypertension) 01/10/2013    Past Surgical History:  Procedure Laterality Date  . CORONARY ARTERY BYPASS GRAFT  09/04/2001   LIMA to LAD,SVG to diagonal,SVG to ramus intermedius,SVG to PDA  . NM MYOVIEW LTD  01/09/2010   no ischemia  . PACEMAKER INSERTION  12/12/2006   Medtronic adapta  . PPM GENERATOR CHANGEOUT N/A 02/02/2017   Procedure: PPM GENERATOR CHANGEOUT - DUAL CHAMBER;  Surgeon: [name], MD;  Location: [facility];  Service: Cardiovascular;  Laterality: N/A;  . PROSTATE SURGERY  2001   cancer  . TONSILLECTOMY          Home Medications    Prior to Admission medications   Medication Sig Start Date End Date Taking? Authorizing Provider  atorvastatin (LIPITOR) 20 MG tablet Take 20 mg by mouth daily.  Yes [provider]  carbidopa-levodopa (SINEMET IR) 25-100 MG tablet Take 1 tablet by mouth 3 (three) times daily. 01/03/19  Yes Penumalli, Earlean Polka, MD  Cholecalciferol (VITAMIN D PO) Take 10,000 Units by mouth every Friday. On Friday    Yes [provider]  ELIQUIS 2.5 MG TABS tablet TAKE 1 TABLET TWICE DAILY Patient taking differently: Take 2.5 mg by mouth 2 (two) times daily.  03/09/19  Yes Croitoru, Mihai, MD  furosemide (LASIX) 40 MG tablet Take 40 mg by mouth daily.    Yes [provider]  glimepiride (AMARYL) 4 MG tablet Take 4 mg by mouth daily before breakfast.   Yes [provider]  JANUVIA 50 MG tablet Take 1 tablet by mouth every evening.  05/27/15  Yes [provider]  metoprolol tartrate (LOPRESSOR) 50 MG tablet TAKE 2 TABLETS IN THE MORNING  AND TAKE 1 TABLET IN THE EVENING Patient taking differently: Take 50-100 mg by mouth See admin instructions. 100mg  in am & 50mg  in evening. 03/09/19  Yes Croitoru, Mihai, MD  Multiple Vitamins-Minerals (PRESERVISION AREDS) CAPS Take 1 capsule by mouth 2 (two) times daily.   Yes [provider]  polyethylene glycol (MIRALAX / GLYCOLAX) packet Take 17 g by mouth daily. To prevent constipation    Yes [provider]  QUEtiapine (SEROQUEL) 25 MG tablet Take 0.5 tablets (12.5 mg total) by mouth at bedtime. 03/01/19 04/09/19 Yes Donne Hazel, MD  vitamin B-12 (CYANOCOBALAMIN) 1000 MCG tablet Take 1,000 mcg by mouth daily.   Yes [provider]    Family History Family History  Problem Relation Age of Onset  . Heart disease Mother   . Heart attack Mother   . Other Father        sepsis  . Other Sister        brain tumor    Social History Social History   Tobacco Use  . Smoking status: Former Smoker    Types: Cigarettes, Cigars    Quit date: 05/14/1962    Years since quitting: 56.9  . Smokeless tobacco: Never Used  Substance Use Topics  . Alcohol use: Yes    Comment: seldom  . Drug use: No     Allergies   Milk-related compounds, Lanoxin [digoxin], and Mexitil [mexiletine]   Review of Systems Review of Systems  Unable to perform ROS: Dementia  Respiratory: Positive for cough.      Physical Exam Updated Vital Signs BP (!) 140/54   Pulse (!) 59   Temp 98.9 F (37.2 C) (Oral)   Resp (!) 21   SpO2 92%   Physical Exam Vitals signs reviewed.  Constitutional:      Appearance: He is well-developed.  HENT:     Head: Normocephalic.   Eyes:     General: No scleral icterus.    Conjunctiva/sclera: Conjunctivae normal.  Neck:     Musculoskeletal: Neck supple.     Thyroid: No thyromegaly.  Cardiovascular:     Rate and Rhythm: Normal rate and regular rhythm.     Heart sounds: No murmur. No friction rub. No gallop.   Pulmonary:     Breath sounds: No stridor. No wheezing or rales.  Chest:     Chest wall: No tenderness.  Abdominal:     General: There is no distension.     Tenderness: There is no abdominal tenderness. There is no rebound.  Musculoskeletal: Normal range of motion.  Lymphadenopathy:     Cervical: No cervical adenopathy.  Skin:  Findings: No erythema or rash.  Neurological:     Mental Status: He is alert.     Motor: No abnormal muscle tone.     Coordination: Coordination normal.     Comments: Oriented to person   CRITICAL CARE Performed by: Milton Ferguson Total critical care time: 35 minutes Critical care time was exclusive of separately billable procedures and treating other patients. Critical care was necessary to treat or prevent imminent or life-threatening deterioration. Critical care was time spent personally by me on the following activities: development of treatment plan with patient and/or surrogate as well as nursing, discussions with consultants, evaluation of patient's response to treatment, examination of patient, obtaining history from patient or surrogate, ordering and performing treatments and interventions, ordering and review of laboratory studies, ordering and review of radiographic studies, pulse oximetry and re-evaluation of patient's condition.    ED Treatments / Results  Labs (all labs ordered are listed, but only abnormal results are displayed) Labs Reviewed  CBC WITH DIFFERENTIAL/PLATELET - Abnormal; Notable for the following components:      Result Value   WBC 11.0 (*)    RBC 2.40 (*)    Hemoglobin 8.1 (*)    HCT 27.0 (*)    MCV 112.5 (*)    RDW 17.3 (*)    Monocytes  Absolute 1.7 (*)    Abs Immature Granulocytes 0.19 (*)    All other components within normal limits  COMPREHENSIVE METABOLIC PANEL - Abnormal; Notable for the following components:   Glucose, Bld 188 (*)    BUN 44 (*)    Creatinine, Ser 1.99 (*)    Calcium 8.3 (*)    Albumin 3.1 (*)    GFR calc non Af Amer 28 (*)    GFR calc Af Amer 32 (*)    All other components within normal limits  BRAIN NATRIURETIC PEPTIDE - Abnormal; Notable for the following components:   B Natriuretic Peptide 565.8 (*)    All other components within normal limits  TROPONIN I (HIGH SENSITIVITY) - Abnormal; Notable for the following components:   Troponin I (High Sensitivity) 36 (*)    All other components within normal limits  CULTURE, BLOOD (ROUTINE X 2)  CULTURE, BLOOD (ROUTINE X 2)  LACTIC ACID, PLASMA  URINALYSIS, ROUTINE W REFLEX MICROSCOPIC  VITAMIN B1  FOLATE RBC  PROCALCITONIN  HIV ANTIBODY (ROUTINE TESTING W REFLEX)  CBC  BASIC METABOLIC PANEL  POC SARS CORONAVIRUS 2 AG -  ED  TROPONIN I (HIGH SENSITIVITY)    EKG EKG Interpretation  Date/Time:  Monday April 09 2019 21:30:22 EST Ventricular Rate:  60 PR Interval:    QRS Duration: 177 QT Interval:  499 QTC Calculation: 499 R Axis:   -143 Text Interpretation: Junctional rhythm Right bundle branch block Inferior infarct, acute Anterolateral infarct, old >>> Acute MI <<< Confirmed by Milton Ferguson [phone]) on 04/09/2019 9:38:20 PM   Radiology Dg Chest Port 1 View  Result Date: 04/09/2019 CLINICAL DATA:  85 year old male with weakness. EXAM: PORTABLE CHEST 1 VIEW COMPARISON:  Chest radiograph dated 03/05/2019 FINDINGS: Bilateral mid to lower lung field opacities most concerning for pneumonia. Clinical correlation and follow-up to resolution recommended. There is no large pleural effusion. No pneumothorax. Stable mild cardiomegaly. Median sternotomy wires and CABG vascular clips. Left pectoral pacemaker device. No acute osseous pathology.  IMPRESSION: Bilateral mid to lower lung field opacities most concerning for pneumonia. Clinical correlation and follow-up to resolution recommended. Electronically Signed   By: Laren Everts.D.  On: 04/09/2019 18:49    Procedures Procedures (including critical care time)  Medications Ordered in ED Medications  vancomycin (VANCOCIN) IVPB 1000 mg/200 mL premix (has no administration in time range)  piperacillin-tazobactam (ZOSYN) IVPB 3.375 g (3.375 g Intravenous New Bag/Given (Non-Interop) 04/09/19 2124)  atorvastatin (LIPITOR) tablet 20 mg (has no administration in time range)  carbidopa-levodopa (SINEMET IR) 25-100 MG per tablet immediate release 1 tablet (has no administration in time range)  apixaban (ELIQUIS) tablet 2.5 mg (has no administration in time range)  furosemide (LASIX) tablet 40 mg (has no administration in time range)  metoprolol tartrate (LOPRESSOR) tablet 50-100 mg (has no administration in time range)  QUEtiapine (SEROQUEL) tablet 12.5 mg (has no administration in time range)  polyethylene glycol (MIRALAX / GLYCOLAX) packet 17 g (has no administration in time range)  PreserVision AREDS CAPS 1 capsule (has no administration in time range)  vitamin B-12 (CYANOCOBALAMIN) tablet 1,000 mcg (has no administration in time range)  cefTRIAXone (ROCEPHIN) 2 g in sodium chloride 0.9 % 100 mL IVPB (has no administration in time range)  azithromycin (ZITHROMAX) 500 mg in sodium chloride 0.9 % 250 mL IVPB (has no administration in time range)     Initial Impression / Assessment and Plan / ED Course  I have reviewed the triage vital signs and the nursing notes.  Pertinent labs & imaging results that were available during my care of the patient were reviewed by me and considered in my medical decision making (see chart for details).        Patient with possible pneumonia.  Also may be some heart failure.  He will be admitted for weakness and pneumonia treatment Final  Clinical Impressions(s) / ED Diagnoses   Final diagnoses:  Healthcare-associated pneumonia    ED Discharge Orders    None       Milton Ferguson, MD 04/09/19 2156    Milton Ferguson, MD 04/20/19 1037

## 2019-04-09 NOTE — ED Notes (Signed)
Pt found standing at bedside. PT redirected into bed and is currently resting with no complaints at this time.

## 2019-04-10 LAB — GLUCOSE, CAPILLARY
Glucose-Capillary: 152 mg/dL — ABNORMAL HIGH (ref 70–99)
Glucose-Capillary: 149 mg/dL — ABNORMAL HIGH (ref 70–99)
Glucose-Capillary: 83 mg/dL (ref 70–99)
Glucose-Capillary: 146 mg/dL — ABNORMAL HIGH (ref 70–99)

## 2019-04-10 LAB — BASIC METABOLIC PANEL
Anion gap: 10 (ref 5–15)
BUN: 44 mg/dL — ABNORMAL HIGH (ref 8–23)
CO2: 27 mmol/L (ref 22–32)
Calcium: 8.4 mg/dL — ABNORMAL LOW (ref 8.9–10.3)
Chloride: 102 mmol/L (ref 98–111)
Creatinine, Ser: 2.17 mg/dL — ABNORMAL HIGH (ref 0.61–1.24)
GFR calc Af Amer: 29 mL/min — ABNORMAL LOW (ref >60)
GFR calc non Af Amer: 25 mL/min — ABNORMAL LOW (ref >60)
Glucose, Bld: 233 mg/dL — ABNORMAL HIGH (ref 70–99)
Potassium: 3.9 mmol/L (ref 3.5–5.1)
Sodium: 139 mmol/L (ref 135–145)

## 2019-04-10 LAB — CBC
HCT: 26 % — ABNORMAL LOW (ref 39.0–52.0)
Hemoglobin: 7.9 g/dL — ABNORMAL LOW (ref 13.0–17.0)
MCH: 34.2 pg — ABNORMAL HIGH (ref 26.0–34.0)
MCHC: 30.4 g/dL (ref 30.0–36.0)
MCV: 112.6 fL — ABNORMAL HIGH (ref 80.0–100.0)
Platelets: 267 10*3/uL (ref 150–400)
RBC: 2.31 MIL/uL — ABNORMAL LOW (ref 4.22–5.81)
RDW: 17.4 % — ABNORMAL HIGH (ref 11.5–15.5)
WBC: 11.5 10*3/uL — ABNORMAL HIGH (ref 4.0–10.5)
nRBC: 0 % (ref 0.0–0.2)

## 2019-04-10 LAB — INFLUENZA PANEL BY PCR (TYPE A & B)
Influenza A By PCR: NEGATIVE
Influenza B By PCR: NEGATIVE

## 2019-04-10 LAB — URINALYSIS, ROUTINE W REFLEX MICROSCOPIC
Bilirubin Urine: NEGATIVE
Glucose, UA: 50 mg/dL — AB
Hgb urine dipstick: NEGATIVE
Ketones, ur: NEGATIVE mg/dL
Leukocytes,Ua: NEGATIVE
Nitrite: NEGATIVE
Protein, ur: NEGATIVE mg/dL
Specific Gravity, Urine: 1.008 (ref 1.005–1.030)
pH: 6 (ref 5.0–8.0)

## 2019-04-10 LAB — HIV ANTIBODY (ROUTINE TESTING W REFLEX): HIV Screen 4th Generation wRfx: NONREACTIVE

## 2019-04-10 LAB — SARS CORONAVIRUS 2 (TAT 6-24 HRS): SARS Coronavirus 2: NEGATIVE

## 2019-04-10 MED ORDER — HALOPERIDOL LACTATE 5 MG/ML IJ SOLN
2.0000 mg | Freq: Once | INTRAMUSCULAR | Status: AC
  Administered 2019-04-11: 2 mg via INTRAVENOUS

## 2019-04-10 MED ORDER — PRO-STAT SUGAR FREE PO LIQD
30.0000 mL | Freq: Two times a day (BID) | ORAL | Status: DC
  Administered 2019-04-10 – 2019-04-13 (×6): 30 mL via ORAL
  Filled 2019-04-10 (×6): qty 30

## 2019-04-10 MED ORDER — BOOST / RESOURCE BREEZE PO LIQD CUSTOM
1.0000 | Freq: Two times a day (BID) | ORAL | Status: DC
  Administered 2019-04-12 – 2019-04-13 (×3): 1 via ORAL

## 2019-04-10 NOTE — Progress Notes (Addendum)
PROGRESS NOTE    [name] Powell  WUJ:[id number] DOB: [birth year] DOA: 04/09/2019 PCP: [name], MD   Brief Narrative:  Patient is a 85 year old male with history of hypertension, diabetes type 2, A. fib, complete heart block status post pacemaker, Parkinson's disease, advanced dementia who was brought to the emergency department with complaints of generalized weakness, difficulty in ambulation, cough, low-grade intermittent fever.  He was admitted in October for multifocal community-acquired pneumonia .  At the time Covid test 19 was negative.  He was also found to be in acute on chronic diastolic CHF.  Improved with antibiotics and diuresis. Chest x-ray on this admission showed multifocal pneumonia.  Started on antibiotics.  Covid- 19 test is pending.   Assessment & Plan:   Principal Problem:   CAP (community acquired pneumonia) Active Problems:   DM2 (diabetes mellitus, type 2) ([place])   HTN (hypertension)   Chronic diastolic congestive heart failure (HCC)   Atrial fibrillation (HCC)   CKD (chronic kidney disease), stage III ([place])   Parkinson's disease ([place])   Acute encephalopathy   Macrocytic anemia   Acute hypoxic respiratory failure: Chest x-ray showed multifocal pneumonia.  Could also be pulmonary edema due to acute on chronic diastolic CHF.  Started on antibiotics.  This morning he was on nasal cannula at 1 L/min.  He was not in any kind of respiratory distress. Covid-19 pending.  Continue isolation. Aspiration pneumonia is also possibility.  Speech therapy consulted. Procalcitonin negative.infleunza negative  Acute on chronic diastolic CHF: Given a dose of IV Lasix.  Continue Lasix oral at home dose.  Continue to monitor input/output.  A. fib: Currently heart rate is well controlled.  Continue Eliquis, beta-blockers.S/P pacemaker.Follows with cardiology.  CAD: S/P CABG  Prostate cancer: S/P prostatectomy.On remission  Hypertension: Blood pressure stable.   Continue current medicines.  Diabetes type 2: Continue sliding scale insulin.  Macrocytic anemia: Folate, thiamine level pending.  Currently H&H is stable.  CKD stage III: Currently kidney function at baseline.  Advanced dementia/Parkinson's disease: Continue Sinemet.Will request for PT evaluation         DVT prophylaxis: Eliquis Code Status:Full  Family Communication: Wife on phone Disposition Plan: Back to home after clinical improvement   Consultants: None   Procedures:None  Antimicrobials:  Anti-infectives (From admission, onward)   Start     Dose/Rate Route Frequency Ordered Stop   04/10/19 0600  cefTRIAXone (ROCEPHIN) 2 g in sodium chloride 0.9 % 100 mL IVPB     2 g 200 mL/hr over 30 Minutes Intravenous Every 24 hours 04/09/19 2146 04/15/19 0559   04/09/19 2200  azithromycin (ZITHROMAX) 500 mg in sodium chloride 0.9 % 250 mL IVPB     500 mg 250 mL/hr over 60 Minutes Intravenous Every 24 hours 04/09/19 2146 04/14/19 2159   04/09/19 2015  vancomycin (VANCOCIN) IVPB 1000 mg/200 mL premix     1,000 mg 200 mL/hr over 60 Minutes Intravenous  Once 04/09/19 2008 04/10/19 0019   04/09/19 2015  piperacillin-tazobactam (ZOSYN) IVPB 3.375 g     3.375 g 100 mL/hr over 30 Minutes Intravenous  Once 04/09/19 2008 04/09/19 2213      Subjective: Patient seen and examined at the bed side this morning.Hemodynamically stable.Not in respiratory distress.Didnot participate with any meaning full conversation.  Objective: Vitals:   04/10/19 0046 04/10/19 0058 04/10/19 0209 04/10/19 0559  BP: (!) 146/66   117/63  Pulse: 61  (!) 59 60  Resp: 16   18  Temp: 98.2 F (36.8 C)  98.7 F (37.1 C)  TempSrc: Oral   Axillary  SpO2: 100%   97%  Weight:  75.6 kg    Height:  5\' 8"  (1.727 m)      Intake/Output Summary (Last 24 hours) at 04/10/2019 1353 Last data filed at 04/10/2019 1010 Gross per 24 hour  Intake 820 ml  Output 150 ml  Net 670 ml   Filed Weights   04/10/19 0058   Weight: 75.6 kg    Examination:  General exam: Elderly demented male.deconditioned HEENT:Ear/Nose normal on gross exam Respiratory system: Bilateral diminished air entry Cardiovascular system: AfibNo pedal edema. Gastrointestinal system: Abdomen is nondistended, soft and nontender.  Central nervous system: Alert and awake but not oriented.  Extremities: No edema, no clubbing ,no cyanosis, distal peripheral pulses palpable. Skin: No rashes, lesions or ulcers,no icterus ,no pallor    Data Reviewed: I have personally reviewed following labs and imaging studies  CBC: Recent Labs  Lab 04/09/19 1846 04/10/19 0134  WBC 11.0* 11.5*  NEUTROABS 7.3  --   HGB 8.1* 7.9*  HCT 27.0* 26.0*  MCV 112.5* 112.6*  PLT 264 096   Basic Metabolic Panel: Recent Labs  Lab 04/09/19 1846 04/10/19 0134  NA 136 139  K 4.2 3.9  CL 102 102  CO2 25 27  GLUCOSE 188* 233*  BUN 44* 44*  CREATININE 1.99* 2.17*  CALCIUM 8.3* 8.4*   GFR: Estimated Creatinine Clearance: 20.1 mL/min (A) (by C-G formula based on SCr of 2.17 mg/dL (H)). Liver Function Tests: Recent Labs  Lab 04/09/19 1846  AST 15  ALT 10  ALKPHOS 113  BILITOT 0.8  PROT 6.9  ALBUMIN 3.1*   No results for input(s): LIPASE, AMYLASE in the last 168 hours. No results for input(s): AMMONIA in the last 168 hours. Coagulation Profile: No results for input(s): INR, PROTIME in the last 168 hours. Cardiac Enzymes: No results for input(s): CKTOTAL, CKMB, CKMBINDEX, TROPONINI in the last 168 hours. BNP (last 3 results) No results for input(s): PROBNP in the last 8760 hours. HbA1C: No results for input(s): HGBA1C in the last 72 hours. CBG: Recent Labs  Lab 04/09/19 2301 04/10/19 0806 04/10/19 1212  GLUCAP 186* 152* 149*   Lipid Profile: No results for input(s): CHOL, HDL, LDLCALC, TRIG, CHOLHDL, LDLDIRECT in the last 72 hours. Thyroid Function Tests: No results for input(s): TSH, T4TOTAL, FREET4, T3FREE, THYROIDAB in the last  72 hours. Anemia Panel: No results for input(s): VITAMINB12, FOLATE, FERRITIN, TIBC, IRON, RETICCTPCT in the last 72 hours. Sepsis Labs: Recent Labs  Lab 04/09/19 1846 04/09/19 2107  PROCALCITON  --  <0.10  LATICACIDVEN 0.9  --     Recent Results (from the past 240 hour(s))  Blood culture (routine x 2)     Status: None (Preliminary result)   Collection Time: 04/09/19  6:46 PM   Specimen: Left Antecubital; Blood  Result Value Ref Range Status   Specimen Description   Final    LEFT ANTECUBITAL Performed at [facility], [place] [address] [place], [place] [address]    Special Requests   Final    BOTTLES DRAWN AEROBIC AND ANAEROBIC Blood Culture adequate volume Performed at [facility] [address], [place], [place] [address]    Culture   Final    NO GROWTH < 12 HOURS Performed at [facility] [address] [place], [place] [address]    Report Status PENDING  Incomplete         Radiology Studies:  Dg Chest Port 1 View  Result Date: 04/09/2019 CLINICAL DATA:  85 year old male with weakness. EXAM: PORTABLE CHEST 1 VIEW COMPARISON:  Chest radiograph dated 03/05/2019 FINDINGS: Bilateral mid to lower lung field opacities most concerning for pneumonia. Clinical correlation and follow-up to resolution recommended. There is no large pleural effusion. No pneumothorax. Stable mild cardiomegaly. Median sternotomy wires and CABG vascular clips. Left pectoral pacemaker device. No acute osseous pathology. IMPRESSION: Bilateral mid to lower lung field opacities most concerning for pneumonia. Clinical correlation and follow-up to resolution recommended. Electronically Signed   By: Anner Crete M.D.   On: 04/09/2019 18:49        Scheduled Meds: . apixaban  2.5 mg Oral BID  . atorvastatin  20 mg Oral Daily  . carbidopa-levodopa  1 tablet Oral TID AC  . furosemide  40 mg Oral Daily  . insulin aspart  0-15 Units Subcutaneous TID WC  .  insulin aspart  0-5 Units Subcutaneous QHS  . metoprolol tartrate  100 mg Oral Daily  . metoprolol tartrate  50 mg Oral QHS  . multivitamin  1 tablet Oral BID  . polyethylene glycol  17 g Oral Daily  . QUEtiapine  12.5 mg Oral QHS  . vitamin B-12  1,000 mcg Oral Daily   Continuous Infusions: . azithromycin 500 mg (04/10/19 0209)  . cefTRIAXone (ROCEPHIN)  IV 2 g (04/10/19 0503)     LOS: 1 day    Time spent:35 mins. More than 50% of that time was spent in counseling and/or coordination of care.      Shelly Coss, MD [facility] Pager [phone]  If 7PM-7AM, please contact night-coverage [email or web address] Password [email or web address] 04/10/2019, 1:53 PM

## 2019-04-10 NOTE — Progress Notes (Signed)
Pt found climbing out of the bed, using teeth to take off mittens. MD made aware.

## 2019-04-10 NOTE — Evaluation (Signed)
Clinical/Bedside Swallow Evaluation Patient Details  Name: [name] Powell MRN: [id number] Date of Birth: 1922/05/16  Today's Date: 04/10/2019 Time: SLP Start Time (ACUTE ONLY): 1400 SLP Stop Time (ACUTE ONLY): 1430 SLP Time Calculation (min) (ACUTE ONLY): 30 min  Past Medical History:  Past Medical History:  Diagnosis Date  . Anemia    chronic  . Atrial fibrillation ([place])   . Cancer ([place])   . CHB (complete heart block) ([place])   . Coronary artery disease   . Diabetes mellitus   . Dyslipidemia   . H/O prostate cancer   . Hypertension   . New onset atrial flutter, persistent 07/04/2014  . Pacemaker 12/12/2006   Medtronic adapta  . Pleural effusion, left   . Pneumonia 09/2015  . Prostate cancer ([place])   . 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 Ventricular tachycardia (paroxysmal) ([place]) 03/28/2014   Past Surgical History:  Past Surgical History:  Procedure Laterality Date  . CORONARY ARTERY BYPASS GRAFT  09/04/2001   LIMA to LAD,SVG to diagonal,SVG to ramus intermedius,SVG to PDA  . NM MYOVIEW LTD  01/09/2010   no ischemia  . PACEMAKER INSERTION  12/12/2006   Medtronic adapta  . PPM GENERATOR CHANGEOUT N/A 02/02/2017   Procedure: PPM GENERATOR CHANGEOUT - DUAL CHAMBER;  Surgeon: [name], MD;  Location: [facility];  Service: Cardiovascular;  Laterality: N/A;  . PROSTATE SURGERY  2001   cancer  . TONSILLECTOMY     HPI:  85yo male admitted 04/08/3029 with generalized weakness, cough, AMS, fever. PMH: HTN, prostate cancer, DM2, AFib, CHB s/p PPM, Parkinson's disease, CAP (02/2019), CHF, CAD s/p CABG. CXR = Bilateral mid to lower lung field opacities most concerning for pneumonia.   Assessment / Plan / Recommendation Clinical Impression  Pt presents with functional oral motor strength, and adequate dentition. CN exam unremarkable. Pt tolerates trials of thin liquid, puree, and solid textures without overt s/s  aspiration, however, extended oral prep of solids raises the risk of aspiration with fatigue, often associated with Parkinson's disease. Will downgrade diet to mechanical soft (dys3) with chopped meats and extra gravy. Will continue thin liquids. SLP will follow up at bedside to assess diet tolerance and continue education    SLP Visit Diagnosis: Dysphagia, unspecified (R13.10)    Aspiration Risk  Mild aspiration risk    Diet Recommendation Dysphagia 3 (Mech soft);Thin liquid chop meats for energy conservation  Liquid Administration via: Cup;Straw Medication Administration: Whole meds with liquid Supervision: Staff to assist with self feeding;Full supervision/cueing for compensatory strategies Compensations: Slow rate;Minimize environmental distractions;Small sips/bites;Lingual sweep for clearance of pocketing Postural Changes: Seated upright at 90 degrees;Remain upright for at least 30 minutes after po intake    Other  Recommendations Oral Care Recommendations: Oral care BID   Follow up Recommendations  24 hour supervision     Frequency and Duration min 1 x/week  2 weeks;1 week       Prognosis Prognosis for Safe Diet Advancement: Fair Barriers to Reach Goals: Cognitive deficits      Swallow Study   General Date of Onset: 04/10/19 HPI: 85yo male admitted 04/08/3029 with generalized weakness, cough, AMS, fever. PMH: HTN, prostate cancer, DM2, AFib, CHB s/p PPM, Parkinson's disease, CAP (02/2019), CHF, CAD s/p CABG. CXR = Bilateral mid to lower lung field opacities most concerning for pneumonia. Type of Study: Bedside Swallow Evaluation Previous Swallow Assessment: none Diet Prior to this Study: Regular;Thin liquids  Temperature Spikes Noted: No Respiratory Status: Nasal cannula History of Recent Intubation: No Behavior/Cognition: Alert;Cooperative;Pleasant mood Oral Cavity Assessment: Within Functional Limits Oral Care Completed by SLP: No Oral Cavity - Dentition: Adequate  natural dentition Self-Feeding Abilities: Total assist Patient Positioning: Upright in bed Baseline Vocal Quality: Low vocal intensity Volitional Cough: Weak Volitional Swallow: Able to elicit    Oral/Motor/Sensory Function Overall Oral Motor/Sensory Function: Within functional limits   Ice Chips Ice chips: Not tested   Thin Liquid Thin Liquid: Within functional limits Presentation: Straw    Nectar Thick Nectar Thick Liquid: Not tested   Honey Thick Honey Thick Liquid: Not tested   Puree Puree: Within functional limits Presentation: Spoon   Solid     Solid: Impaired Oral Phase Functional Implications: Prolonged oral transit     [name] Powell, [place], [place] Pathologist Office: [phone] Pager: [phone]  Shonna Chock 04/10/2019,2:30 PM

## 2019-04-10 NOTE — Progress Notes (Signed)
Initial Nutrition Assessment  DOCUMENTATION CODES:   Not applicable  INTERVENTION:  -Boost Breeze po BID, each supplement provides 250 kcal and 9 grams of protein  -Prostat 30 ml po BID, each supplement provides 100 kcal and 15 grams of protein  -MVI with minerals daily   NUTRITION DIAGNOSIS:   Increased nutrient needs related to acute illness, chronic illness(CAP; chronic diastolic CHF) as evidenced by percent weight loss(50% po intake of meals insufficient to meet needs).   GOAL:   Patient will meet greater than or equal to 90% of their needs    MONITOR:   PO intake, Labs, I & O's, Supplement acceptance, Weight trends  REASON FOR ASSESSMENT:   Malnutrition Screening Tool    ASSESSMENT:  RD working remotely.  85 year old male with past medical history significant of HTN, T2DM, A-fib, CHB s/p PPM, Parkinson's disease, recent admission in October for multifocal CAP and acute on chronic diastolic CHF who was brought to ED via EMS with generalized weakness; reporting inability to stand for the past couple of days, low grade intermittent fever per wife and AMS. Patient admitted with AMS secondary to CAP.  Per chart review patient also found to be in acute on chronic diastolic CHF, improved with antibiotics and diuresis. Covid-19 test is pending.  Unable to obtain nutrition history at this time. Patient with AMS, MD noted patient did not participate with any meaningful conversation. SLP evaluation today recommending D3;thin for energy conservation and mild aspiration risk. Patient eating 50% of meals at this time. Given recent severe weight losses, history of present illness and current poor po intake, highly suspect malnutrition. NFPE to be completed at follow up to assess for fat/muscle depletions.   Pt with poor oral intake and would benefit from nutrition supplement. Patient noted to have coughing with milk related products per contraindications when ordering. Given CAP and  mild aspiration risk will provide Boost Breeze and continue to monitor CBGs.   Medications reviewed and include: Laisx 40 mg daily, Sinemet IR TID, SSI, Miralax, Seroquel, Vitamin B12 Zithromax Rocephin Labs: CBGs 83-186 x 24 hrs, Cr 2.17 (H), BUN 44 (H), WBC 11.5 (H), Hgb 7.9 (L)  Current wt 75.6 kg (166.3 lb) non-pitting BLE edema per review of RN assessment.  Weight history reviewed; noted 7.3 lb (4.2%) wt loss in 2 weeks which is severe for time frame. UBW appears to fluctuate between 77.1-82.8 kg over the past year. On 6/17 pt wt 77.1 kg (169.6 lb), 8/26 pt wt 79,7 kg (175.3 lb), 10/15 pt wt 81.6 kg (179.5 lb), 11/23 pt wt 78.9 kg (173.6 lb)  NUTRITION - FOCUSED PHYSICAL EXAM: Unable to complete at this time, RD working remotely.  Diet Order:   Diet Order            DIET DYS 3 Room service appropriate? No; Fluid consistency: Thin  Diet effective now              EDUCATION NEEDS:   No education needs have been identified at this time  Skin:  Skin Assessment: Reviewed RN Assessment  Last BM:  11/30  Height:   Ht Readings from Last 1 Encounters:  04/10/19 5\' 8"  (1.727 m)    Weight:   Wt Readings from Last 1 Encounters:  04/10/19 75.6 kg    Ideal Body Weight:  70 kg  BMI:  Body mass index is 25.34 kg/m.  Estimated Nutritional Needs:   Kcal:  1800-2000  Protein:  90-100  Fluid:  >/= 1.8  [place], RD, LDN Clinical Nutrition [place] Telephone [phone] After Hours/Weekend Pager: [phone]

## 2019-04-11 LAB — BASIC METABOLIC PANEL
Anion gap: 10 (ref 5–15)
BUN: 42 mg/dL — ABNORMAL HIGH (ref 8–23)
CO2: 28 mmol/L (ref 22–32)
Calcium: 8.6 mg/dL — ABNORMAL LOW (ref 8.9–10.3)
Chloride: 105 mmol/L (ref 98–111)
Creatinine, Ser: 1.87 mg/dL — ABNORMAL HIGH (ref 0.61–1.24)
GFR calc Af Amer: 35 mL/min — ABNORMAL LOW (ref >60)
GFR calc non Af Amer: 30 mL/min — ABNORMAL LOW (ref >60)
Glucose, Bld: 107 mg/dL — ABNORMAL HIGH (ref 70–99)
Potassium: 4.3 mmol/L (ref 3.5–5.1)
Sodium: 143 mmol/L (ref 135–145)

## 2019-04-11 LAB — FOLATE RBC
Folate, Hemolysate: 325 ng/mL
Folate, RBC: 1310 ng/mL (ref >498)
Hematocrit: 24.8 % — ABNORMAL LOW (ref 37.5–51.0)

## 2019-04-11 LAB — GLUCOSE, CAPILLARY
Glucose-Capillary: 75 mg/dL (ref 70–99)
Glucose-Capillary: 144 mg/dL — ABNORMAL HIGH (ref 70–99)
Glucose-Capillary: 140 mg/dL — ABNORMAL HIGH (ref 70–99)
Glucose-Capillary: 157 mg/dL — ABNORMAL HIGH (ref 70–99)

## 2019-04-11 LAB — CBC WITH DIFFERENTIAL/PLATELET
Abs Immature Granulocytes: 0.11 10*3/uL — ABNORMAL HIGH (ref 0.00–0.07)
Basophils Absolute: 0 10*3/uL (ref 0.0–0.1)
Basophils Relative: 0 %
Eosinophils Absolute: 0.1 10*3/uL (ref 0.0–0.5)
Eosinophils Relative: 1 %
HCT: 27.9 % — ABNORMAL LOW (ref 39.0–52.0)
Hemoglobin: 8.2 g/dL — ABNORMAL LOW (ref 13.0–17.0)
Immature Granulocytes: 1 %
Lymphocytes Relative: 14 %
Lymphs Abs: 1.2 10*3/uL (ref 0.7–4.0)
MCH: 33.6 pg (ref 26.0–34.0)
MCHC: 29.4 g/dL — ABNORMAL LOW (ref 30.0–36.0)
MCV: 114.3 fL — ABNORMAL HIGH (ref 80.0–100.0)
Monocytes Absolute: 2.8 10*3/uL — ABNORMAL HIGH (ref 0.1–1.0)
Monocytes Relative: 32 %
Neutro Abs: 4.6 10*3/uL (ref 1.7–7.7)
Neutrophils Relative %: 52 %
Platelets: 250 10*3/uL (ref 150–400)
RBC: 2.44 MIL/uL — ABNORMAL LOW (ref 4.22–5.81)
RDW: 17.2 % — ABNORMAL HIGH (ref 11.5–15.5)
WBC: 8.8 10*3/uL (ref 4.0–10.5)
nRBC: 0 % (ref 0.0–0.2)

## 2019-04-11 MED ORDER — HALOPERIDOL LACTATE 5 MG/ML IJ SOLN
INTRAMUSCULAR | Status: AC
  Filled 2019-04-11: qty 1

## 2019-04-11 MED ORDER — FUROSEMIDE 10 MG/ML IJ SOLN
40.0000 mg | Freq: Two times a day (BID) | INTRAMUSCULAR | Status: DC
  Administered 2019-04-11 – 2019-04-13 (×4): 40 mg via INTRAVENOUS
  Filled 2019-04-11 (×4): qty 4

## 2019-04-11 MED ORDER — FUROSEMIDE 10 MG/ML IJ SOLN: 40.0000 mg | Freq: Every day | INTRAMUSCULAR | Status: DC

## 2019-04-11 NOTE — Evaluation (Signed)
Physical Therapy Evaluation Patient Details Name: [name] Powell MRN: [id number] DOB: [birth year] Today's Date: 04/11/2019   History of Present Illness  85 year old male with history of hypertension, diabetes type 2, A. fib, complete heart block status post pacemaker, Parkinson's disease, advanced dementia and admitted for CAP and acute on chronic diastolic heart failure  Clinical Impression  Pt admitted with above diagnosis.  Pt currently with functional limitations due to the deficits listed below (see PT Problem List). Pt will benefit from skilled PT to increase their independence and safety with mobility to allow discharge to the venue listed below.  Pt assisted with sitting EOB and able to sit without trunk support.  Pt agreeable to standing with RW and able to perform marching in place however declined ambulating due to fatigue.  Pt from home with spouse per chart.  Uncertain of assist available at home, so pt may need SNF upon d/c.     Follow Up Recommendations SNF;Supervision/Assistance - 24 hour    Equipment Recommendations  None recommended by PT    Recommendations for Other Services       Precautions / Restrictions Precautions Precautions: Fall Precaution Comments: currently on 2L O2      Mobility  Bed Mobility Overal bed mobility: Needs Assistance Bed Mobility: Supine to Sit;Sit to Supine     Supine to sit: Total assist;+2 for physical assistance Sit to supine: Mod assist   General bed mobility comments: pt resting upon arrival and agreeable to mobilize however kept eyes closed, total assist for sitting however only mod for return to bed for assisting LEs  Transfers Overall transfer level: Needs assistance Equipment used: Rolling walker (2 wheeled) Transfers: Sit to/from Stand Sit to Stand: Min assist;+2 safety/equipment         General transfer comment: multimodal cues for safe technique, pt assisted with transfers, able to march in place and take a few  steps toward [facility] however declined ambulating due to fatigue; remained on 2L O2 [place]; pt denies SOB  Ambulation/Gait                Stairs            Wheelchair Mobility    Modified Rankin (Stroke Patients Only)       Balance Overall balance assessment: Needs assistance         Standing balance support: Bilateral upper extremity supported Standing balance-Leahy Scale: Poor Standing balance comment: reliant on UE support                             Pertinent Vitals/Pain Pain Assessment: Faces Faces Pain Scale: No hurt    Home Living Family/patient expects to be discharged to:: Private residence Living Arrangements: Spouse/significant other Available Help at Discharge: Family;Personal care attendant;Available 24 hours/day Type of Home: Apartment Home Access: Stairs to enter Entrance Stairs-Rails: Right Entrance Stairs-Number of Steps: level entry at front, 5-6 steps in back Home Layout: One level Home Equipment: Grab bars - tub/shower;Grab bars - toilet;Shower seat - built in;Shower seat;Walker - 2 wheels;Cane - single point;Wheelchair - [employer] - 4 wheels Additional Comments: Wife with him at night and 2 aides come in ~12 hours a day    Prior Function Level of Independence: Needs assistance   Gait / Transfers Assistance Needed: Uses the cane mostly inside. Has PT coming in 2x/week. Longer distance uses rollator.  ADL's / Homemaking Assistance Needed: Wife assists with shower (he washes upper body and she  does lower body) and dressing. Aides do housework, helps him with bathroom, Dr. visits, walks for exercise.  Comments: Above from admission one month ago     Hand Dominance        Extremity/Trunk Assessment        Lower Extremity Assessment Lower Extremity Assessment: Generalized weakness       Communication   Communication: HOH  Cognition Arousal/Alertness: Awake/alert Behavior During Therapy: WFL for tasks  assessed/performed Overall Cognitive Status: History of cognitive impairments - at baseline                                 General Comments: able to follow simple commands, hx of advanced dementia      General Comments      Exercises     Assessment/Plan    PT Assessment Patient needs continued PT services  PT Problem List Decreased strength;Decreased mobility;Decreased activity tolerance;Decreased balance;Decreased knowledge of use of DME       PT Treatment Interventions DME instruction;Gait training;Therapeutic exercise;Therapeutic activities;Patient/family education;Functional mobility training;Balance training    PT Goals (Current goals can be found in the Care Plan section)  Acute Rehab PT Goals PT Goal Formulation: Patient unable to participate in goal setting Time For Goal Achievement: 04/25/19 Potential to Achieve Goals: Fair    Frequency Min 3X/week   Barriers to discharge        Co-evaluation               AM-PAC PT "6 Clicks" Mobility  Outcome Measure Help needed turning from your back to your side while in a flat bed without using bedrails?: A Little Help needed moving from lying on your back to sitting on the side of a flat bed without using bedrails?: A Lot Help needed moving to and from a bed to a chair (including a wheelchair)?: A Lot Help needed standing up from a chair using your arms (e.g., wheelchair or bedside chair)?: A Little Help needed to walk in hospital room?: A Lot Help needed climbing 3-5 steps with a railing? : A Lot 6 Click Score: 14    End of Session Equipment Utilized During Treatment: Gait belt;Oxygen Activity Tolerance: Patient tolerated treatment well Patient left: in bed;with call bell/phone within reach;with bed alarm set Nurse Communication: Mobility status PT Visit Diagnosis: Unsteadiness on feet (R26.81);Muscle weakness (generalized) (M62.81)    Time: 8341-9622 PT Time Calculation (min) (ACUTE ONLY):  13 min   Charges:   PT Evaluation $PT Eval Low Complexity: [place], PT, DPT Acute Rehabilitation Services Office: [phone] Pager: [phone]  Trena Platt 04/11/2019, 11:45 AM

## 2019-04-11 NOTE — Progress Notes (Signed)
PROGRESS NOTE    [name] Powell  QQV:[id number] DOB: [birth year] DOA: 04/09/2019 PCP: [name], MD   Brief Narrative:  Patient is a 85 year old male with history of hypertension, diabetes type 2, A. fib, complete heart block status post pacemaker, Parkinson's disease, advanced dementia who was brought to the emergency department with complaints of generalized weakness, difficulty in ambulation, cough, low-grade intermittent fever.  He was admitted in October for multifocal community-acquired pneumonia .  At the time Covid test 19 was negative.  He was also found to be in acute on chronic diastolic CHF.  Improved with antibiotics and diuresis. Chest x-ray on this admission showed multifocal pneumonia.  Started on antibiotics.  Covid- 19 test is negative   Assessment & Plan:   Principal Problem:   CAP (community acquired pneumonia) Active Problems:   DM2 (diabetes mellitus, type 2) ([place])   HTN (hypertension)   Chronic diastolic congestive heart failure (HCC)   Atrial fibrillation (HCC)   CKD (chronic kidney disease), stage III ([place])   Parkinson's disease ([place])   Acute encephalopathy   Macrocytic anemia   Acute hypoxic respiratory failure: Chest x-ray showed multifocal pneumonia.  Started on antibiotics.  This morning he was on nasal cannula at 2 L/min.  He was not in any kind of respiratory distress. Covid-19 negative. Aspiration pneumonia is also possibility.  Speech therapy consulted and recommended dysphagia 3 diet.. Procalcitonin negative.infleunza negative  Acute on chronic diastolic CHF: He has bilateral coarse crackles.  I will continue Lasix 40 mg IV twice a day for now.  He is on Lasix 40 mg daily at home.  Continue to monitor input and output.  A. fib: Currently heart rate is well controlled.  Continue Eliquis, beta-blockers.S/P pacemaker.Follows with cardiology.  CAD: S/P CABG  Prostate cancer: S/P prostatectomy.On remission  Hypertension: Blood pressure  stable.  Continue current medicines.  Diabetes type 2: Continue sliding scale insulin.  Macrocytic anemia: Folate, thiamine level pending.  Currently H&H is stable.  CKD stage III: Baseline creatinine around 2.  Currently kidney function at baseline.  Advanced dementia/Parkinson's disease: Continue Sinemet.  Debility/deconditioning: Physical therapy evaluated the patient recommended skilled nursing facility. High chance of declined by his insurance due to his custodial status.  I talked to his wife this morning and she wants to take him home with home health.  Nutrition Problem: Increased nutrient needs Etiology: acute illness, chronic illness(CAP; chronic diastolic CHF)      DVT prophylaxis: Eliquis Code Status:Full  Family Communication: Wife on phone Disposition Plan: Back to home with home health after clinical improvement   Consultants: None   Procedures:None  Antimicrobials:  Anti-infectives (From admission, onward)   Start     Dose/Rate Route Frequency Ordered Stop   04/10/19 0600  cefTRIAXone (ROCEPHIN) 2 g in sodium chloride 0.9 % 100 mL IVPB     2 g 200 mL/hr over 30 Minutes Intravenous Every 24 hours 04/09/19 2146 04/15/19 0559   04/09/19 2200  azithromycin (ZITHROMAX) 500 mg in sodium chloride 0.9 % 250 mL IVPB     500 mg 250 mL/hr over 60 Minutes Intravenous Every 24 hours 04/09/19 2146 04/14/19 2159   04/09/19 2015  vancomycin (VANCOCIN) IVPB 1000 mg/200 mL premix     1,000 mg 200 mL/hr over 60 Minutes Intravenous  Once 04/09/19 2008 04/10/19 0019   04/09/19 2015  piperacillin-tazobactam (ZOSYN) IVPB 3.375 g     3.375 g 100 mL/hr over 30 Minutes Intravenous  Once 04/09/19 2008 04/09/19 2213  Subjective: Patient seen and examined at bedside this morning.  hemodynamically stable.  On 2 L of oxygen per minute this morning.  He is not in any kind of respiratory stress.  Not coughing.  Objective: Vitals:   04/10/19 0559 04/10/19 1402 04/10/19 2221  04/11/19 1253  BP: 117/63 (!) 157/63 (!) 158/69 (!) 150/61  Pulse: 60 60 (!) 59 (!) 59  Resp: 18 20 18 16   Temp: 98.7 F (37.1 C) 98 F (36.7 C) 98.1 F (36.7 C) 97.8 F (36.6 C)  TempSrc: Axillary Oral Oral Oral  SpO2: 97% 96% 95% 100%  Weight:      Height:        Intake/Output Summary (Last 24 hours) at 04/11/2019 1306 Last data filed at 04/11/2019 1040 Gross per 24 hour  Intake 689.23 ml  Output 850 ml  Net -160.77 ml   Filed Weights   04/10/19 0058  Weight: 75.6 kg    Examination:  General exam: Elderly demented male.deconditioned HEENT:Ear/Nose normal on gross exam Respiratory system: Bilateral diminished air entry, bilateral crackles Cardiovascular system: Afib,No pedal edema. Gastrointestinal system: Abdomen is nondistended, soft and nontender.  Central nervous system: Alert and awake but not oriented.  Extremities: No edema, no clubbing ,no cyanosis, distal peripheral pulses palpable. Skin: No rashes, lesions or ulcers,no icterus ,no pallor    Data Reviewed: I have personally reviewed following labs and imaging studies  CBC: Recent Labs  Lab 04/09/19 1846 04/10/19 0134 04/11/19 0829  WBC 11.0* 11.5* 8.8  NEUTROABS 7.3  --  4.6  HGB 8.1* 7.9* 8.2*  HCT 27.0* 26.0* 27.9*  MCV 112.5* 112.6* 114.3*  PLT 264 267 740   Basic Metabolic Panel: Recent Labs  Lab 04/09/19 1846 04/10/19 0134 04/11/19 0829  NA 136 139 143  K 4.2 3.9 4.3  CL 102 102 105  CO2 25 27 28   GLUCOSE 188* 233* 107*  BUN 44* 44* 42*  CREATININE 1.99* 2.17* 1.87*  CALCIUM 8.3* 8.4* 8.6*   GFR: Estimated Creatinine Clearance: 23.4 mL/min (A) (by C-G formula based on SCr of 1.87 mg/dL (H)). Liver Function Tests: Recent Labs  Lab 04/09/19 1846  AST 15  ALT 10  ALKPHOS 113  BILITOT 0.8  PROT 6.9  ALBUMIN 3.1*   No results for input(s): LIPASE, AMYLASE in the last 168 hours. No results for input(s): AMMONIA in the last 168 hours. Coagulation Profile: No results for  input(s): INR, PROTIME in the last 168 hours. Cardiac Enzymes: No results for input(s): CKTOTAL, CKMB, CKMBINDEX, TROPONINI in the last 168 hours. BNP (last 3 results) No results for input(s): PROBNP in the last 8760 hours. HbA1C: No results for input(s): HGBA1C in the last 72 hours. CBG: Recent Labs  Lab 04/10/19 1212 04/10/19 1809 04/10/19 2227 04/11/19 0751 04/11/19 1146  GLUCAP 149* 83 146* 75 144*   Lipid Profile: No results for input(s): CHOL, HDL, LDLCALC, TRIG, CHOLHDL, LDLDIRECT in the last 72 hours. Thyroid Function Tests: No results for input(s): TSH, T4TOTAL, FREET4, T3FREE, THYROIDAB in the last 72 hours. Anemia Panel: No results for input(s): VITAMINB12, FOLATE, FERRITIN, TIBC, IRON, RETICCTPCT in the last 72 hours. Sepsis Labs: Recent Labs  Lab 04/09/19 1846 04/09/19 2107  PROCALCITON  --  <0.10  LATICACIDVEN 0.9  --     Recent Results (from the past 240 hour(s))  Blood culture (routine x 2)     Status: None (Preliminary result)   Collection Time: 04/09/19  6:46 PM   Specimen: Left Antecubital; Blood  Result Value  Ref Range Status   Specimen Description   Final    LEFT ANTECUBITAL Performed at [facility] [address] [place], [place] [address]    Special Requests   Final    BOTTLES DRAWN AEROBIC AND ANAEROBIC Blood Culture adequate volume Performed at [facility] [address], [place], [place] [address]    Culture   Final    NO GROWTH 2 DAYS Performed at [facility] [address], [place], [place] [address]    Report Status PENDING  Incomplete  Blood culture (routine x 2)     Status: None (Preliminary result)   Collection Time: 04/09/19  9:07 PM   Specimen: BLOOD  Result Value Ref Range Status   Specimen Description   Final    BLOOD LEFT ANTECUBITAL Performed at [facility] [address], [place], [place] [address]    Special Requests   Final    BOTTLES DRAWN  AEROBIC AND ANAEROBIC Blood Culture adequate volume Performed at [facility] [address], [place], [place] [address]    Culture   Final    NO GROWTH 1 DAY Performed at [facility], [place] [address] [place], [place] [address]    Report Status PENDING  Incomplete  SARS CORONAVIRUS 2 (TAT 6-24 HRS) Nasopharyngeal Nasopharyngeal Swab     Status: None   Collection Time: 04/09/19 10:35 PM   Specimen: Nasopharyngeal Swab  Result Value Ref Range Status   SARS Coronavirus 2 NEGATIVE NEGATIVE Final    Comment: (NOTE) SARS-CoV-2 target nucleic acids are NOT DETECTED. The SARS-CoV-2 RNA is generally detectable in upper and lower respiratory specimens during the acute phase of infection. Negative results do not preclude SARS-CoV-2 infection, do not rule out co-infections with other pathogens, and should not be used as the sole basis for treatment or other patient management decisions. Negative results must be combined with clinical observations, patient history, and epidemiological information. The expected result is Negative. Fact Sheet for Patients: SugarRoll.be Fact Sheet for Healthcare Providers: https://www.woods-mathews.com/ This test is not yet approved or cleared by the [place] FDA and  has been authorized for detection and/or diagnosis of SARS-CoV-2 by FDA under an Emergency Use Authorization (EUA). This EUA will remain  in effect (meaning this test can be used) for the duration of the COVID-19 declaration under Section 56 4(b)(1) of the Act, 21 U.S.C. section 360bbb-3(b)(1), unless the authorization is terminated or revoked sooner. Performed at [facility], [place] [address] [place], [place] [address]          Radiology Studies: Dg Chest [place] 1 View  Result Date: 04/09/2019 CLINICAL DATA:  85 year old male with weakness. EXAM: PORTABLE CHEST 1 VIEW COMPARISON:  Chest radiograph dated  03/05/2019 FINDINGS: Bilateral mid to lower lung field opacities most concerning for pneumonia. Clinical correlation and follow-up to resolution recommended. There is no large pleural effusion. No pneumothorax. Stable mild cardiomegaly. Median sternotomy wires and CABG vascular clips. Left pectoral pacemaker device. No acute osseous pathology. IMPRESSION: Bilateral mid to lower lung field opacities most concerning for pneumonia. Clinical correlation and follow-up to resolution recommended. Electronically Signed   By: Anner Crete M.D.   On: 04/09/2019 18:49        Scheduled Meds: . apixaban  2.5 mg Oral BID  . atorvastatin  20 mg Oral Daily  . carbidopa-levodopa  1 tablet Oral TID AC  . feeding supplement  1 Container Oral BID BM  .  feeding supplement (PRO-STAT SUGAR FREE 64)  30 mL Oral BID  . furosemide  40 mg Intravenous Q12H  . insulin aspart  0-15 Units Subcutaneous TID WC  . insulin aspart  0-5 Units Subcutaneous QHS  . metoprolol tartrate  100 mg Oral Daily  . metoprolol tartrate  50 mg Oral QHS  . multivitamin  1 tablet Oral BID  . polyethylene glycol  17 g Oral Daily  . QUEtiapine  12.5 mg Oral QHS  . vitamin B-12  1,000 mcg Oral Daily   Continuous Infusions: . azithromycin Stopped (04/11/19 0038)  . cefTRIAXone (ROCEPHIN)  IV 200 mL/hr at 04/11/19 0600     LOS: 2 days    Time spent:35 mins. More than 50% of that time was spent in counseling and/or coordination of care.      Shelly Coss, MD [facility] Pager [phone]  If 7PM-7AM, please contact night-coverage [email or web address] Password [email or web address] 04/11/2019, 1:06 PM

## 2019-04-12 ENCOUNTER — Inpatient Hospital Stay (HOSPITAL_COMMUNITY): Payer: Medicare HMO

## 2019-04-12 LAB — BASIC METABOLIC PANEL
Anion gap: 12 (ref 5–15)
BUN: 45 mg/dL — ABNORMAL HIGH (ref 8–23)
CO2: 29 mmol/L (ref 22–32)
Calcium: 8.7 mg/dL — ABNORMAL LOW (ref 8.9–10.3)
Chloride: 101 mmol/L (ref 98–111)
Creatinine, Ser: 1.87 mg/dL — ABNORMAL HIGH (ref 0.61–1.24)
GFR calc Af Amer: 35 mL/min — ABNORMAL LOW (ref >60)
GFR calc non Af Amer: 30 mL/min — ABNORMAL LOW (ref >60)
Glucose, Bld: 97 mg/dL (ref 70–99)
Potassium: 3.9 mmol/L (ref 3.5–5.1)
Sodium: 142 mmol/L (ref 135–145)

## 2019-04-12 LAB — GLUCOSE, CAPILLARY
Glucose-Capillary: 68 mg/dL — ABNORMAL LOW (ref 70–99)
Glucose-Capillary: 110 mg/dL — ABNORMAL HIGH (ref 70–99)
Glucose-Capillary: 172 mg/dL — ABNORMAL HIGH (ref 70–99)
Glucose-Capillary: 152 mg/dL — ABNORMAL HIGH (ref 70–99)
Glucose-Capillary: 177 mg/dL — ABNORMAL HIGH (ref 70–99)

## 2019-04-12 MED ORDER — HALOPERIDOL LACTATE 5 MG/ML IJ SOLN
5.0000 mg | Freq: Once | INTRAMUSCULAR | Status: AC
  Administered 2019-04-12: 5 mg via INTRAVENOUS
  Filled 2019-04-12: qty 1

## 2019-04-12 NOTE — Care Management Important Message (Signed)
Important Message  Patient Details IM Letter given to [name] to present to the Patient Name: [name] Powell MRN: [id number] Date of Birth: July 02, 1922   Medicare Important Message Given:  Yes     Kerin Salen 04/12/2019, 12:42 PM

## 2019-04-12 NOTE — Progress Notes (Signed)
Patient hypoglycemic this AM at 70 - no symtoms, possibly related to decreased PO intake.  Gave Apple juice, on re check blood sugar now 110.  MD at bedside and made aware.  Patient awake and alert - feeding himself breakfast at this time

## 2019-04-12 NOTE — Progress Notes (Signed)
Pt's oxygen saturation spot checked while sleeping. Pt noted to be 85% on room air. Pt not in any respiratory distress, resting comfortably. 2L [place] applied to patient. Pt's oxygen saturation now 96%. Will continue to monitor closely.

## 2019-04-12 NOTE — Progress Notes (Signed)
PROGRESS NOTE    [name] Powell  XNA:[id number] DOB: [birth year] DOA: 04/09/2019 PCP: [name], MD   Brief Narrative:  Patient is a 85 year old male with history of hypertension, diabetes type 2, A. fib, complete heart block status post pacemaker, Parkinson's disease, advanced dementia who was brought to the emergency department with complaints of generalized weakness, difficulty in ambulation, cough, low-grade intermittent fever.  He was admitted in October for multifocal community-acquired pneumonia .  At the time Covid test 19 was negative.  He was also found to be in acute on chronic diastolic CHF.  Improved with antibiotics and diuresis. Chest x-ray on this admission showed multifocal pneumonia.  Started on antibiotics.  Covid- 19 test is negative His respiratory status has significantly improved today.  He still has bilateral crackles but he is feeling much better today mental status has improved.  Plan for discharge home tomorrow with home health.   Assessment & Plan:   Principal Problem:   CAP (community acquired pneumonia) Active Problems:   DM2 (diabetes mellitus, type 2) ([place])   HTN (hypertension)   Chronic diastolic congestive heart failure (HCC)   Atrial fibrillation (HCC)   CKD (chronic kidney disease), stage III ([place])   Parkinson's disease ([place])   Acute encephalopathy   Macrocytic anemia   Acute hypoxic respiratory failure: Chest x-ray showed multifocal pneumonia.  Started on antibiotics.  This morning he was on nasal cannula at 2 L/min.  He was not in any kind of respiratory distress. Covid-19 negative. Aspiration pneumonia is also possibility.  Speech therapy consulted and recommended dysphagia 3 diet. Procalcitonin negative.infleunza negative Continue current antibiotics.  Respiratory status has significantly improved.  Acute on chronic diastolic CHF: He has bilateral coarse crackles.  I will continue Lasix 40 mg IV twice a day for today.  He is on Lasix  40 mg daily at home.  Continue to monitor input and output.  A. fib: Currently heart rate is well controlled.  Continue Eliquis, beta-blockers.S/P pacemaker.Follows with cardiology.  CAD: S/P CABG  Prostate cancer: S/P prostatectomy.On remission  Hypertension: Blood pressure stable.  Continue current medicines.  Diabetes type 2: Continue sliding scale insulin.  Macrocytic anemia: Folate, thiamine level pending.  Currently H&H is stable.  CKD stage III: Baseline creatinine around 2.  Currently kidney function at baseline.  Advanced dementia/Parkinson's disease: Continue Sinemet.  Debility/deconditioning: Physical therapy evaluated the patient recommended skilled nursing facility. High chance of decline by his insurance due to his custodial status.  I talked to his wife  and she wants to take him home with home health.  Nutrition Problem: Increased nutrient needs Etiology: acute illness, chronic illness(CAP; chronic diastolic CHF)      DVT prophylaxis: Eliquis Code Status:Full  Family Communication: Wife on phone Disposition Plan: Back to home with home health after clinical improvement,likely tomorrow   Consultants: None   Procedures:None  Antimicrobials:  Anti-infectives (From admission, onward)   Start     Dose/Rate Route Frequency Ordered Stop   04/10/19 0600  cefTRIAXone (ROCEPHIN) 2 g in sodium chloride 0.9 % 100 mL IVPB     2 g 200 mL/hr over 30 Minutes Intravenous Every 24 hours 04/09/19 2146 04/15/19 0559   04/09/19 2200  azithromycin (ZITHROMAX) 500 mg in sodium chloride 0.9 % 250 mL IVPB     500 mg 250 mL/hr over 60 Minutes Intravenous Every 24 hours 04/09/19 2146 04/14/19 2159   04/09/19 2015  vancomycin (VANCOCIN) IVPB 1000 mg/200 mL premix     1,000 mg 200  mL/hr over 60 Minutes Intravenous  Once 04/09/19 2008 04/10/19 0019   04/09/19 2015  piperacillin-tazobactam (ZOSYN) IVPB 3.375 g     3.375 g 100 mL/hr over 30 Minutes Intravenous  Once 04/09/19 2008  04/09/19 2213      Subjective:  Patient seen and examined the bedside this morning.  Looks much better today.  Sitting on the bed and eating his breakfast.  He is very alert, awake and communicates.  Denies any complaints.  Objective: Vitals:   04/12/19 0005 04/12/19 0050 04/12/19 0437 04/12/19 1241  BP:  (!) 134/57 139/60 (!) 164/71  Pulse:  (!) 59 (!) 59 (!) 59  Resp:  18 18 20   Temp:  97.9 F (36.6 C) 98.5 F (36.9 C) 97.9 F (36.6 C)  TempSrc:  Oral Oral Oral  SpO2: 96% 100% 100% 100%  Weight:      Height:        Intake/Output Summary (Last 24 hours) at 04/12/2019 1319 Last data filed at 04/12/2019 0900 Gross per 24 hour  Intake 750.86 ml  Output 2175 ml  Net -1424.14 ml   Filed Weights   04/10/19 0058  Weight: 75.6 kg    Examination:  General exam: Elderly demented male.deconditioned,, debilitated HEENT:Ear/Nose normal on gross exam Respiratory system: Bilateral diminished air entry, bilateral crackles Cardiovascular system: Afib,No pedal edema. Gastrointestinal system: Abdomen is nondistended, soft and nontender.  Central nervous system: Alert and awake but not oriented.  Extremities: No edema, no clubbing ,no cyanosis, distal peripheral pulses palpable. Skin: No rashes, lesions or ulcers,no icterus ,no pallor    Data Reviewed: I have personally reviewed following labs and imaging studies  CBC: Recent Labs  Lab 04/09/19 1846 04/10/19 0134 04/11/19 0829  WBC 11.0* 11.5* 8.8  NEUTROABS 7.3  --  4.6  HGB 8.1* 7.9* 8.2*  HCT 27.0* 26.0*  24.8* 27.9*  MCV 112.5* 112.6* 114.3*  PLT 264 267 671   Basic Metabolic Panel: Recent Labs  Lab 04/09/19 1846 04/10/19 0134 04/11/19 0829 04/12/19 0356  NA 136 139 143 142  K 4.2 3.9 4.3 3.9  CL 102 102 105 101  CO2 25 27 28 29   GLUCOSE 188* 233* 107* 97  BUN 44* 44* 42* 45*  CREATININE 1.99* 2.17* 1.87* 1.87*  CALCIUM 8.3* 8.4* 8.6* 8.7*   GFR: Estimated Creatinine Clearance: 23.4 mL/min (A) (by C-G  formula based on SCr of 1.87 mg/dL (H)). Liver Function Tests: Recent Labs  Lab 04/09/19 1846  AST 15  ALT 10  ALKPHOS 113  BILITOT 0.8  PROT 6.9  ALBUMIN 3.1*   No results for input(s): LIPASE, AMYLASE in the last 168 hours. No results for input(s): AMMONIA in the last 168 hours. Coagulation Profile: No results for input(s): INR, PROTIME in the last 168 hours. Cardiac Enzymes: No results for input(s): CKTOTAL, CKMB, CKMBINDEX, TROPONINI in the last 168 hours. BNP (last 3 results) No results for input(s): PROBNP in the last 8760 hours. HbA1C: No results for input(s): HGBA1C in the last 72 hours. CBG: Recent Labs  Lab 04/11/19 1613 04/11/19 2037 04/12/19 0745 04/12/19 0835 04/12/19 1118  GLUCAP 140* 157* 68* 110* 172*   Lipid Profile: No results for input(s): CHOL, HDL, LDLCALC, TRIG, CHOLHDL, LDLDIRECT in the last 72 hours. Thyroid Function Tests: No results for input(s): TSH, T4TOTAL, FREET4, T3FREE, THYROIDAB in the last 72 hours. Anemia Panel: No results for input(s): VITAMINB12, FOLATE, FERRITIN, TIBC, IRON, RETICCTPCT in the last 72 hours. Sepsis Labs: Recent Labs  Lab 04/09/19 1846  04/09/19 2107  PROCALCITON  --  <0.10  LATICACIDVEN 0.9  --     Recent Results (from the past 240 hour(s))  Blood culture (routine x 2)     Status: None (Preliminary result)   Collection Time: 04/09/19  6:46 PM   Specimen: Left Antecubital; Blood  Result Value Ref Range Status   Specimen Description   Final    LEFT ANTECUBITAL Performed at [facility] [address], [place], [place] [address]    Special Requests   Final    BOTTLES DRAWN AEROBIC AND ANAEROBIC Blood Culture adequate volume Performed at [facility] [address], [place], [place] [address]    Culture   Final    NO GROWTH 3 DAYS Performed at [facility], [place] [address], [place], [place] [address]    Report Status PENDING  Incomplete  Blood culture (routine  x 2)     Status: None (Preliminary result)   Collection Time: 04/09/19  9:07 PM   Specimen: BLOOD  Result Value Ref Range Status   Specimen Description   Final    BLOOD LEFT ANTECUBITAL Performed at [facility] [address], [place], [place] [address]    Special Requests   Final    BOTTLES DRAWN AEROBIC AND ANAEROBIC Blood Culture adequate volume Performed at [facility] [address], [place], [place] [address]    Culture   Final    NO GROWTH 2 DAYS Performed at [facility] [address], [place], [place] [address]    Report Status PENDING  Incomplete  SARS CORONAVIRUS 2 (TAT 6-24 HRS) Nasopharyngeal Nasopharyngeal Swab     Status: None   Collection Time: 04/09/19 10:35 PM   Specimen: Nasopharyngeal Swab  Result Value Ref Range Status   SARS Coronavirus 2 NEGATIVE NEGATIVE Final    Comment: (NOTE) SARS-CoV-2 target nucleic acids are NOT DETECTED. The SARS-CoV-2 RNA is generally detectable in upper and lower respiratory specimens during the acute phase of infection. Negative results do not preclude SARS-CoV-2 infection, do not rule out co-infections with other pathogens, and should not be used as the sole basis for treatment or other patient management decisions. Negative results must be combined with clinical observations, patient history, and epidemiological information. The expected result is Negative. Fact Sheet for Patients: SugarRoll.be Fact Sheet for Healthcare Providers: https://www.woods-mathews.com/ This test is not yet approved or cleared by the [place] FDA and  has been authorized for detection and/or diagnosis of SARS-CoV-2 by FDA under an Emergency Use Authorization (EUA). This EUA will remain  in effect (meaning this test can be used) for the duration of the COVID-19 declaration under Section 56 4(b)(1) of the Act, 21 U.S.C. section 360bbb-3(b)(1), unless the  authorization is terminated or revoked sooner. Performed at [facility], [place] [address] [place], [place] [address]          Radiology Studies: No results found.      Scheduled Meds: . apixaban  2.5 mg Oral BID  . atorvastatin  20 mg Oral Daily  . carbidopa-levodopa  1 tablet Oral TID AC  . feeding supplement  1 Container Oral BID BM  . feeding supplement (PRO-STAT SUGAR FREE 64)  30 mL Oral BID  . furosemide  40 mg Intravenous Q12H  . insulin aspart  0-15 Units Subcutaneous TID WC  . insulin aspart  0-5 Units Subcutaneous QHS  . metoprolol tartrate  100 mg Oral Daily  .  metoprolol tartrate  50 mg Oral QHS  . multivitamin  1 tablet Oral BID  . polyethylene glycol  17 g Oral Daily  . QUEtiapine  12.5 mg Oral QHS  . vitamin B-12  1,000 mcg Oral Daily   Continuous Infusions: . azithromycin 500 mg (04/11/19 2125)  . cefTRIAXone (ROCEPHIN)  IV 2 g (04/12/19 0600)     LOS: 3 days    Time spent:35 mins. More than 50% of that time was spent in counseling and/or coordination of care.      Shelly Coss, MD [facility] Pager [phone]  If 7PM-7AM, please contact night-coverage [email or web address] Password [email or web address] 04/12/2019, 1:19 PM

## 2019-04-13 ENCOUNTER — Emergency Department (HOSPITAL_COMMUNITY)
Admission: EM | Admit: 2019-04-13 | Discharge: 2019-04-13 | Disposition: A | Discharge status: Home / Self Care | Payer: Medicare HMO | Attending: Emergency Medicine | Admitting: Emergency Medicine

## 2019-04-13 ENCOUNTER — Other Ambulatory Visit: Payer: Self-pay

## 2019-04-13 DIAGNOSIS — Z950 Presence of cardiac pacemaker: Secondary | ICD-10-CM | POA: Insufficient documentation

## 2019-04-13 DIAGNOSIS — Z7901 Long term (current) use of anticoagulants: Secondary | ICD-10-CM | POA: Insufficient documentation

## 2019-04-13 DIAGNOSIS — E1122 Type 2 diabetes mellitus with diabetic chronic kidney disease: Secondary | ICD-10-CM | POA: Diagnosis not present

## 2019-04-13 DIAGNOSIS — N183 Chronic kidney disease, stage 3 unspecified: Secondary | ICD-10-CM | POA: Insufficient documentation

## 2019-04-13 DIAGNOSIS — I5032 Chronic diastolic (congestive) heart failure: Secondary | ICD-10-CM | POA: Diagnosis not present

## 2019-04-13 DIAGNOSIS — Z9981 Dependence on supplemental oxygen: Secondary | ICD-10-CM

## 2019-04-13 DIAGNOSIS — Z79899 Other long term (current) drug therapy: Secondary | ICD-10-CM | POA: Insufficient documentation

## 2019-04-13 DIAGNOSIS — Z87891 Personal history of nicotine dependence: Secondary | ICD-10-CM | POA: Insufficient documentation

## 2019-04-13 DIAGNOSIS — Z8546 Personal history of malignant neoplasm of prostate: Secondary | ICD-10-CM | POA: Diagnosis not present

## 2019-04-13 DIAGNOSIS — I130 Hypertensive heart and chronic kidney disease with heart failure and stage 1 through stage 4 chronic kidney disease, or unspecified chronic kidney disease: Secondary | ICD-10-CM | POA: Insufficient documentation

## 2019-04-13 DIAGNOSIS — Z951 Presence of aortocoronary bypass graft: Secondary | ICD-10-CM | POA: Insufficient documentation

## 2019-04-13 DIAGNOSIS — I2510 Atherosclerotic heart disease of native coronary artery without angina pectoris: Secondary | ICD-10-CM | POA: Diagnosis not present

## 2019-04-13 LAB — CBC WITH DIFFERENTIAL/PLATELET
Abs Immature Granulocytes: 0.05 10*3/uL (ref 0.00–0.07)
Basophils Absolute: 0 10*3/uL (ref 0.0–0.1)
Basophils Relative: 0 %
Eosinophils Absolute: 0.1 10*3/uL (ref 0.0–0.5)
Eosinophils Relative: 2 %
HCT: 24 % — ABNORMAL LOW (ref 39.0–52.0)
Hemoglobin: 7.2 g/dL — ABNORMAL LOW (ref 13.0–17.0)
Immature Granulocytes: 1 %
Lymphocytes Relative: 15 %
Lymphs Abs: 1 10*3/uL (ref 0.7–4.0)
MCH: 33.3 pg (ref 26.0–34.0)
MCHC: 30 g/dL (ref 30.0–36.0)
MCV: 111.1 fL — ABNORMAL HIGH (ref 80.0–100.0)
Monocytes Absolute: 2 10*3/uL — ABNORMAL HIGH (ref 0.1–1.0)
Monocytes Relative: 29 %
Neutro Abs: 3.7 10*3/uL (ref 1.7–7.7)
Neutrophils Relative %: 53 %
Platelets: 214 10*3/uL (ref 150–400)
RBC: 2.16 MIL/uL — ABNORMAL LOW (ref 4.22–5.81)
RDW: 16.7 % — ABNORMAL HIGH (ref 11.5–15.5)
WBC: 6.8 10*3/uL (ref 4.0–10.5)
nRBC: 0 % (ref 0.0–0.2)

## 2019-04-13 LAB — BASIC METABOLIC PANEL
Anion gap: 12 (ref 5–15)
BUN: 51 mg/dL — ABNORMAL HIGH (ref 8–23)
CO2: 30 mmol/L (ref 22–32)
Calcium: 8.6 mg/dL — ABNORMAL LOW (ref 8.9–10.3)
Chloride: 101 mmol/L (ref 98–111)
Creatinine, Ser: 1.98 mg/dL — ABNORMAL HIGH (ref 0.61–1.24)
GFR calc Af Amer: 33 mL/min — ABNORMAL LOW (ref >60)
GFR calc non Af Amer: 28 mL/min — ABNORMAL LOW (ref >60)
Glucose, Bld: 119 mg/dL — ABNORMAL HIGH (ref 70–99)
Potassium: 3.8 mmol/L (ref 3.5–5.1)
Sodium: 143 mmol/L (ref 135–145)

## 2019-04-13 LAB — GLUCOSE, CAPILLARY
Glucose-Capillary: 103 mg/dL — ABNORMAL HIGH (ref 70–99)
Glucose-Capillary: 90 mg/dL (ref 70–99)
Glucose-Capillary: 143 mg/dL — ABNORMAL HIGH (ref 70–99)
Glucose-Capillary: 180 mg/dL — ABNORMAL HIGH (ref 70–99)

## 2019-04-13 LAB — VITAMIN B12: Vitamin B-12: 1209 pg/mL — ABNORMAL HIGH (ref 180–914)

## 2019-04-13 LAB — VITAMIN B1: Vitamin B1 (Thiamine): 122.5 nmol/L (ref 66.5–200.0)

## 2019-04-13 MED ORDER — CEFDINIR 300 MG PO CAPS: 300.0000 mg | ORAL_CAPSULE | Freq: Two times a day (BID) | ORAL | 0 refills | Status: DC

## 2019-04-13 NOTE — ED Triage Notes (Signed)
Pt was discharged from inpatient, when pt arrived at home, there was no oxygen. Pt was brought back to ED. Social Work is working on arranging oxygen to be delivered to the home.

## 2019-04-13 NOTE — ED Provider Notes (Signed)
[facility] DEPT Provider Note   CSN: [id number] Arrival date & time: 04/13/19  1513     History   Chief Complaint Chief Complaint  Patient presents with  . Medical Equipment    HPI [name] Powell is a 85 y.o. male with a past medical history of hypertension, DM 2, A. fib with complete heart block status post pacemaker, Parkinson's disease, dementia, who was discharged today at 35 after being admitted for HCAP.  According to reports when they arrived at home there was no oxygen and he is requiring 2 L of home oxygen therefore he was brought back to the emergency room.  He denies any new complaints or concerns, feels unchanged from when he was discharged. He denies any pain, is asking for his wife.     HPI  Past Medical History:  Diagnosis Date  . Anemia    chronic  . Atrial fibrillation ([place])   . Cancer ([place])   . CHB (complete heart block) ([place])   . Coronary artery disease   . Diabetes mellitus   . Dyslipidemia   . H/O prostate cancer   . Hypertension   . New onset atrial flutter, persistent 07/04/2014  . Pacemaker 12/12/2006   Medtronic adapta  . Pleural effusion, left   . Pneumonia 09/2015  . Prostate cancer ([place])   . 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 Ventricular tachycardia (paroxysmal) ([place]) 03/28/2014    Patient Active Problem List   Diagnosis Date Noted  . Macrocytic anemia 04/09/2019  . CAP (community acquired pneumonia) 02/23/2019  . Parkinson's disease ([place]) 02/22/2019  . Acute encephalopathy 02/22/2019  . Symptomatic anemia 10/23/2018  . Pacemaker battery depletion 02/02/2017  . CHF (congestive heart failure) ([place]) 07/27/2016  . Weakness generalized 07/27/2016  . Generalized weakness 07/27/2016  . Long term current use of anticoagulant 01/16/2016  . Acute respiratory failure with hypoxia ([place]) 09/15/2015  . Acute on chronic diastolic congestive heart failure ([place])  09/15/2015  . CKD (chronic kidney disease), stage III ([place]) 09/15/2015  . Atrial fibrillation ([place])   . Pleural effusion, left   . Acute diastolic (congestive) heart failure ([place])   . Community acquired pneumonia 02/19/2015  . Atrial flutter ([place]) 01/20/2015  . New onset atrial flutter, persistent 07/04/2014  . Ventricular tachycardia (paroxysmal) ([place]) 03/28/2014  . Chronic diastolic congestive heart failure ([place]) 01/02/2014  . Pacemaker dependent - Medtronic 01/10/2013  . CAD s/p CABG 2003 01/10/2013  . Sinus arrest 01/10/2013  . CHB (complete heart block) ([place]) 01/10/2013  . DM2 (diabetes mellitus, type 2) ([place]) 01/10/2013  . Dyslipidemia 01/10/2013  . HTN (hypertension) 01/10/2013    Past Surgical History:  Procedure Laterality Date  . CORONARY ARTERY BYPASS GRAFT  09/04/2001   LIMA to LAD,SVG to diagonal,SVG to ramus intermedius,SVG to PDA  . NM MYOVIEW LTD  01/09/2010   no ischemia  . PACEMAKER INSERTION  12/12/2006   Medtronic adapta  . PPM GENERATOR CHANGEOUT N/A 02/02/2017   Procedure: PPM GENERATOR CHANGEOUT - DUAL CHAMBER;  Surgeon: [name], MD;  Location: [facility];  Service: Cardiovascular;  Laterality: N/A;  . PROSTATE SURGERY  2001   cancer  . TONSILLECTOMY          Home Medications    Prior to Admission medications   Medication Sig Start Date End Date Taking? Authorizing Provider  atorvastatin (LIPITOR) 20 MG tablet Take 20 mg by mouth daily.  [provider]  carbidopa-levodopa (SINEMET IR) 25-100 MG tablet Take 1 tablet by mouth 3 (three) times daily. 01/03/19   Penumalli, Earlean Polka, MD  cefdinir (OMNICEF) 300 MG capsule Take 1 capsule (300 mg total) by mouth 2 (two) times daily. 04/14/19   Shelly Coss, MD  Cholecalciferol (VITAMIN D PO) Take 10,000 Units by mouth every Friday. On Friday     [provider]  ELIQUIS 2.5 MG TABS tablet TAKE 1 TABLET TWICE DAILY Patient taking differently: Take 2.5 mg by mouth 2 (two) times  daily.  03/09/19   Croitoru, Mihai, MD  furosemide (LASIX) 40 MG tablet Take 40 mg by mouth daily.    [provider]  glimepiride (AMARYL) 4 MG tablet Take 4 mg by mouth daily before breakfast.    [provider]  JANUVIA 50 MG tablet Take 1 tablet by mouth every evening.  05/27/15   [provider]  metoprolol tartrate (LOPRESSOR) 50 MG tablet TAKE 2 TABLETS IN THE MORNING  AND TAKE 1 TABLET IN THE EVENING Patient taking differently: Take 50-100 mg by mouth See admin instructions. 100mg  in am & 50mg  in evening. 03/09/19   Croitoru, Mihai, MD  Multiple Vitamins-Minerals (PRESERVISION AREDS) CAPS Take 1 capsule by mouth 2 (two) times daily.    [provider]  polyethylene glycol (MIRALAX / GLYCOLAX) packet Take 17 g by mouth daily. To prevent constipation     [provider]  QUEtiapine (SEROQUEL) 25 MG tablet Take 0.5 tablets (12.5 mg total) by mouth at bedtime. 03/01/19 04/09/19  Donne Hazel, MD  vitamin B-12 (CYANOCOBALAMIN) 1000 MCG tablet Take 1,000 mcg by mouth daily.    [provider]    Family History Family History  Problem Relation Age of Onset  . Heart disease Mother   . Heart attack Mother   . Other Father        sepsis  . Other Sister        brain tumor    Social History Social History   Tobacco Use  . Smoking status: Former Smoker    Types: Cigarettes, Cigars    Quit date: 05/14/1962    Years since quitting: 56.9  . Smokeless tobacco: Never Used  Substance Use Topics  . Alcohol use: Yes    Comment: seldom  . Drug use: No     Allergies   Milk-related compounds, Lanoxin [digoxin], and Mexitil [mexiletine]   Review of Systems Review of Systems  Respiratory: Positive for shortness of breath (Unchanged).    All systems are reviewed without any significant changes from the time of discharge. Physical Exam Updated Vital Signs BP (!) 151/73   Pulse (!) 59   Temp 97.8 F (36.6 C) (Oral)   Resp 18    SpO2 100%   Physical Exam Vitals signs and nursing note reviewed.  Constitutional:      General: He is not in acute distress.    Appearance: He is well-developed. He is not diaphoretic.     Comments: Appears frail, chronically ill-appearing.  HENT:     Head: Normocephalic and atraumatic.  Eyes:     General: No scleral icterus.       Right eye: No discharge.        Left eye: No discharge.     Conjunctiva/sclera: Conjunctivae normal.  Neck:     Musculoskeletal: Normal range of motion and neck supple.  Cardiovascular:     Rate and Rhythm: Normal rate and regular rhythm.  Pulmonary:  Effort: Pulmonary effort is normal. No respiratory distress.     Breath sounds: No stridor.     Comments: Bilateral crackles Abdominal:     General: Abdomen is flat. There is no distension.     Tenderness: There is no guarding.  Musculoskeletal:        General: No deformity.  Skin:    General: Skin is warm and dry.  Neurological:     Mental Status: He is alert. Mental status is at baseline.     Motor: No abnormal muscle tone.     Comments: He is awake and alert.    Psychiatric:        Mood and Affect: Mood normal.        Behavior: Behavior normal.      ED Treatments / Results  Labs (all labs ordered are listed, but only abnormal results are displayed) Labs Reviewed - No data to display  EKG None  Radiology   Procedures Procedures (including critical care time)  Medications Ordered in ED Medications - No data to display   Initial Impression / Assessment and Plan / ED Course  I have reviewed the triage vital signs and the nursing notes.  Pertinent labs & imaging results that were available during my care of the patient were reviewed by me and considered in my medical decision making (see chart for details).  Clinical Course as of Apr 12 1653  Fri Apr 13, 2019  1648 Report from patients RN that she spoke with CM and patient has oxygen at home and is ready for discharge.  [facility]  has been called.    [EH]    Clinical Course User Index [EH] Lorin Glass, PA-C      Patient presents today for lack of oxygen at home. He was discharged and upon arriving at home it was discovered that he did not have oxygen delivered as was previously reported so transport company brought him to the emergency room. Here he denies any complaints or concerns and has had no significant events since his discharge. Case management was able to obtain oxygen and reports that there is oxygen at home waiting for patient. Patient remained hemodynamically stable while in my care. Patient discharged home.    Final Clinical Impressions(s) / ED Diagnoses   Final diagnoses:  Oxygen dependent    ED Discharge Orders    None       Ollen Gross 04/13/19 2300    Valarie Merino, MD 04/16/19 1026

## 2019-04-13 NOTE — Progress Notes (Signed)
Mittens removed. Pt calm and cooperative at this time. Will CTM.

## 2019-04-13 NOTE — Progress Notes (Signed)
SATURATION QUALIFICATIONS: (This note is used to comply with regulatory documentation for home oxygen)  Patient Saturations on Room Air at Rest = 88%  Patient Saturations on Room Air while Ambulating = Pt unable to ambulate, but with movement/activity in the bed SpO2 at 86%.  Patient Saturations on 2 Liters of oxygen while Ambulating = 100%

## 2019-04-13 NOTE — TOC Initial Note (Signed)
Transition of Care [facility]) - Initial/Assessment Note    Patient Details  Name: [name] Powell MRN: [id number] Date of Birth: Oct 07, 1922  [facility] CM/SW Contact:    Wende Neighbors, LCSW Phone Number: 04/13/2019, 10:00 AM  Clinical Narrative:         CSW spoke with patients spouse via phone. Inez Catalina stated she wants patient to come home with [facility]. Inez Catalina stated that patient was being followed by [facility] prior to coming to the hospital and she would like to continue with them. CSW spoke with [facility] liaison to restart [facility] PT and HHRN.  Spouse stated that patient also receives 24 hour care from a private home health agency. Oxygen has been ordered through [employer] and will be delivered to patients home. Patient spouse stated she would like patient to be transported via [facility].        Expected Discharge Plan: [place] Barriers to Discharge: No Barriers Identified   Patient Goals and CMS Choice        Expected Discharge Plan and Services Expected Discharge Plan: [place] In-house Referral: Clinical Social Work Discharge Planning Services: CM Consult Post Acute Care Choice: [place] arrangements for the past 2 months: Single Family Home                 DME Arranged: Oxygen DME Agency: [facility] Date DME Agency Contacted: 04/13/19 Time DME Agency Contacted: [phone] Representative spoke with at DME Agency: zack HH Arranged: PT, Refused SNF, [facility] Agency: [facility] Date HH Agency Contacted: 04/13/19 Time [place]: [id number] Representative spoke with at [place]: [place] Arrangements/Services Living arrangements for the past 2 months: [place] with:: Spouse Patient language and need for interpreter reviewed:: Yes Do you feel safe going back to the place where you live?: Yes      Need for Family Participation in Patient Care: Yes (Comment) Care giver support system in place?: Yes  (comment) Current home services: Homehealth aide Criminal Activity/Legal Involvement Pertinent to Current Situation/Hospitalization: No - Comment as needed  Activities of Daily Living Home Assistive Devices/Equipment: Walker (specify type), CBG Meter ADL Screening (condition at time of admission) Patient's cognitive ability adequate to safely complete daily activities?: No Is the patient deaf or have difficulty hearing?: No Does the patient have difficulty seeing, even when wearing glasses/contacts?: No Does the patient have difficulty concentrating, remembering, or making decisions?: Yes Patient able to express need for assistance with ADLs?: Yes Does the patient have difficulty dressing or bathing?: No Independently performs ADLs?: No Communication: Independent Dressing (OT): Needs assistance Is this a change from baseline?: Change from baseline, expected to last <3days Grooming: Needs assistance Is this a change from baseline?: Change from baseline, expected to last <3 days Feeding: Independent Bathing: Needs assistance Is this a change from baseline?: Change from baseline, expected to last <3 days Toileting: Needs assistance Is this a change from baseline?: Change from baseline, expected to last <3 days In/Out Bed: Needs assistance Is this a change from baseline?: Change from baseline, expected to last <3 days Walks in Home: Needs assistance Is this a change from baseline?: Change from baseline, expected to last <3 days Does the patient have difficulty walking or climbing stairs?: Yes Weakness of Legs: Both Weakness of Arms/Hands: None  Permission Sought/Granted Permission sought to share information with : Family Supports Permission granted to share information with : Yes, Verbal Permission Granted  Share  Information with NAME: Kaenan Jake     Permission granted to share info w Relationship: spouse  Permission granted to share info w Contact Information:  [phone]  Emotional Assessment Appearance:: Appears older than stated age Attitude/Demeanor/Rapport: Unable to Assess Affect (typically observed): Unable to Assess Orientation: : Oriented to Self, Oriented to  Time, Oriented to Place Alcohol / Substance Use: Not Applicable Psych Involvement: No (comment)  Admission diagnosis:  Healthcare-associated pneumonia [J18.9] Patient Active Problem List   Diagnosis Date Noted  . Macrocytic anemia 04/09/2019  . CAP (community acquired pneumonia) 02/23/2019  . Parkinson's disease ([place]) 02/22/2019  . Acute encephalopathy 02/22/2019  . Symptomatic anemia 10/23/2018  . Pacemaker battery depletion 02/02/2017  . CHF (congestive heart failure) ([place]) 07/27/2016  . Weakness generalized 07/27/2016  . Generalized weakness 07/27/2016  . Long term current use of anticoagulant 01/16/2016  . Acute respiratory failure with hypoxia ([place]) 09/15/2015  . Acute on chronic diastolic congestive heart failure ([place]) 09/15/2015  . CKD (chronic kidney disease), stage III ([place]) 09/15/2015  . Atrial fibrillation ([place])   . Pleural effusion, left   . Acute diastolic (congestive) heart failure ([place])   . Community acquired pneumonia 02/19/2015  . Atrial flutter ([place]) 01/20/2015  . New onset atrial flutter, persistent 07/04/2014  . Ventricular tachycardia (paroxysmal) ([place]) 03/28/2014  . Chronic diastolic congestive heart failure ([place]) 01/02/2014  . Pacemaker dependent - Medtronic 01/10/2013  . CAD s/p CABG 2003 01/10/2013  . Sinus arrest 01/10/2013  . CHB (complete heart block) ([place]) 01/10/2013  . DM2 (diabetes mellitus, type 2) ([place]) 01/10/2013  . Dyslipidemia 01/10/2013  . HTN (hypertension) 01/10/2013   PCP:  [name], MD Pharmacy:   [facility], [place] [address] Phone: [phone] Fax: [phone]     Social Determinants of Health (SDOH) Interventions     Readmission Risk Interventions No flowsheet data found.

## 2019-04-13 NOTE — Progress Notes (Addendum)
2:30 pm Spoke to dc CSW and oxygen was not delivered to home. [facility] had to bring pt back to hospital. [facility] CM received referral to arrange oxygen as previous agency did not have a delivery time until 5 pm or after. CM contacted [facility] and they can arrange delivery to home within hour to patient's home. Spoke to [employer], Tommi Rumps will have delivered to home. Jonnie Finner RN CCM, [facility] ED TOC CM [phone]  4:30 pm [facility] delivered oxygen to home. [facility] arranged. [place], [place] ED TOC CM [phone]

## 2019-04-13 NOTE — Discharge Summary (Signed)
Physician Discharge Summary  [name] Powell PXT:[id number] DOB: 19-May-1922 DOA: 04/09/2019  PCP: [name], MD  Admit date: 04/09/2019 Discharge date: 04/13/2019  Admitted From: Home Disposition:  Home  Discharge Condition:Stable CODE STATUS:FULL Diet recommendation:Dysphagia 3  Brief/Interim Summary:  Patient is a 85 year old male with history of hypertension, diabetes type 2, A. fib, complete heart block status post pacemaker, Parkinson's disease, advanced dementia who was brought to the emergency department with complaints of generalized weakness, difficulty in ambulation, cough, low-grade intermittent fever.  He was admitted in October for multifocal community-acquired pneumonia .  At the time Covid test 19 was negative.  He was also found to be in acute on chronic diastolic CHF.  He has improved with antibiotics and diuresis. Chest x-ray on this admission showed multifocal pneumonia.  Started on antibiotics.  Covid- 19 test is negative His respiratory status has significantly improved .  He still has bilateral crackles but he is feeling much better today and mental status has improved.  CT chest showed mixed picture of groundglass, consolidations, pulmonary edema, lung scaring.  He has crackles on bases which I suspect is chronic.  Patient qualified for home oxygen.  He was also seen by speech therapy and recommended dysphagia 3 diet.  Patient seen by PT and recommended skilled nursing facility but family wants to take him home. He is hemodynamically stable for discharge to home today.  Following problems were addressed during his hospitalization:  Acute hypoxic respiratory failure: Chest x-ray showed multifocal pneumonia.  Started on antibiotics.  This morning he was on nasal cannula at 2 L/min.  He was not in any kind of respiratory distress. Covid-19 negative. Aspiration pneumonia is also possibility.  Speech therapy consulted and recommended dysphagia 3 diet. Procalcitonin  negative.infleunza negative Respiratory status has significantly improved. CT chest showed mixed picture of groundglass, consolidations, pulmonary edema, lung scaring.  He has crackles on bases which I suspect is chronic.  Patient qualified for home oxygen  Acute on chronic diastolic CHF: He has bilateral coarse crackles. He was treated with IV lasix.  He is on Lasix 40 mg daily at home.    A. fib: Currently heart rate is well controlled.  Continue Eliquis, beta-blockers.S/P pacemaker.Follows with cardiology.  CAD: S/P CABG  Prostate cancer: S/P prostatectomy.On remission  Hypertension: Blood pressure stable.  Continue current medicines.  Diabetes type 2: Continue home regimen  Macrocytic anemia: Currently H&H is stable.Folic acid level normal  CKD stage III: Baseline creatinine around 2.  Currently kidney function at baseline.  Advanced dementia/Parkinson's disease: Continue Sinemet.  Debility/deconditioning: Physical therapy evaluated the patient recommended skilled nursing facility. High chance of decline by his insurance due to his custodial status.  I talked to his wife  and she wants to take him home with home health.   Discharge Diagnoses:  Principal Problem:   CAP (community acquired pneumonia) Active Problems:   DM2 (diabetes mellitus, type 2) ([place])   HTN (hypertension)   Chronic diastolic congestive heart failure (HCC)   Atrial fibrillation (HCC)   CKD (chronic kidney disease), stage III ([place])   Parkinson's disease ([place])   Acute encephalopathy   Macrocytic anemia    Discharge Instructions  Discharge Instructions    Diet general   Complete by: As directed    Dysphagia 3   Discharge instructions   Complete by: As directed    1)Take prescribed medications as instructed. 2)Follow up with your PCP in a week.  Do a CBC, BMP test during the follow-up.  Increase activity slowly   Complete by: As directed      Allergies as of 04/13/2019       Reactions   Milk-related Compounds Cough   Lanoxin [digoxin] Rash   Eyelid rash   Mexitil [mexiletine] Other (See Comments)   unknown      Medication List    TAKE these medications   atorvastatin 20 MG tablet Commonly known as: LIPITOR Take 20 mg by mouth daily.   carbidopa-levodopa 25-100 MG tablet Commonly known as: SINEMET IR Take 1 tablet by mouth 3 (three) times daily.   cefdinir 300 MG capsule Commonly known as: OMNICEF Take 1 capsule (300 mg total) by mouth 2 (two) times daily. Start taking on: April 14, 2019   Eliquis 2.5 MG Tabs tablet Generic drug: apixaban TAKE 1 TABLET TWICE DAILY What changed: how much to take   furosemide 40 MG tablet Commonly known as: LASIX Take 40 mg by mouth daily.   glimepiride 4 MG tablet Commonly known as: AMARYL Take 4 mg by mouth daily before breakfast.   Januvia 50 MG tablet Generic drug: sitaGLIPtin Take 1 tablet by mouth every evening.   metoprolol tartrate 50 MG tablet Commonly known as: LOPRESSOR TAKE 2 TABLETS IN THE MORNING  AND TAKE 1 TABLET IN THE EVENING What changed: See the new instructions.   polyethylene glycol 17 g packet Commonly known as: MIRALAX / GLYCOLAX Take 17 g by mouth daily. To prevent constipation   PreserVision AREDS Caps Take 1 capsule by mouth 2 (two) times daily.   QUEtiapine 25 MG tablet Commonly known as: SEROQUEL Take 0.5 tablets (12.5 mg total) by mouth at bedtime.   vitamin B-12 1000 MCG tablet Commonly known as: CYANOCOBALAMIN Take 1,000 mcg by mouth daily.   VITAMIN D PO Take 10,000 Units by mouth every Friday. On Friday            Durable Medical Equipment  (From admission, onward)         Start     Ordered   04/13/19 1004  For home use only DME oxygen  Once    Question Answer Comment  Length of Need Lifetime   Mode or (Route) Nasal cannula   Liters per Minute 2   Frequency Continuous (stationary and portable oxygen unit needed)   Oxygen delivery system Gas       04/13/19 1003         Follow-up Information    [name], MD. Schedule an appointment as soon as possible for a visit in 1 week(s).   Specialty: Family Medicine Contact information: [place] [address] [phone]          Allergies  Allergen Reactions  . Milk-Related Compounds Cough  . Lanoxin [Digoxin] Rash    Eyelid rash  . Mexitil [Mexiletine] Other (See Comments)    unknown    Consultations:  None   Procedures/Studies: Ct Chest Wo Contrast  Result Date: 04/12/2019 CLINICAL DATA:  85 year old with history of hypertension, chest x-ray on current admission showing multifocal pneumonia. EXAM: CT CHEST WITHOUT CONTRAST TECHNIQUE: Multidetector CT imaging of the chest was performed following the standard protocol without IV contrast. COMPARISON:  Chest x-ray of 04/09/2019, CT chest of December 09, 2006. FINDINGS: Cardiovascular: Left-sided entry for dual lead pacer device, leads in right ventricle and right atrium. Heart size is enlarged without signs of pericardial effusion. Four-chamber enlargement. Main pulmonary artery at 3.8 cm. Atherosclerotic calcification of the thoracic aorta. Mediastinum/Nodes: No sign of  mediastinal lymphadenopathy. Lungs/Pleura: Signs of septal thickening. Evidence of ground-glass with some mild consolidative changes in the right middle lobe. Chronic pleural thickening in the left lung base slightly worse than in 2008, associated with some parenchymal distortion which is unchanged. Airways are patent though there is moderate material within the dependent aspect of the trachea. Small right-sided pleural effusion layers dependently. Upper Abdomen: Cholelithiasis. No acute findings in the upper abdomen. Incidental note is made of moderate gastric distension. Musculoskeletal: Spinal degenerative changes without acute or destructive bone process. IMPRESSION: Consolidation and ground-glass in the right middle lobe may reflect  mild pneumonia. Pneumonitis from aspiration is considered given material in the trachea. Signs of suspected background pulmonary edema suspected with small right-sided effusion. Chronic pleural thickening and scarring in the left chest. Moderate gastric distention. Signs of cardiomegaly in the setting of CABG and dual lead pacer with calcified generalized atherosclerosis and coronary artery disease. Findings that likely represent pulmonary arterial hypertension with dilation of the main pulmonary artery. Aortic Atherosclerosis (ICD10-I70.0). Electronically Signed   By: Zetta Bills M.D.   On: 04/12/2019 16:21   Dg Chest Port 1 View  Result Date: 04/09/2019 CLINICAL DATA:  85 year old male with weakness. EXAM: PORTABLE CHEST 1 VIEW COMPARISON:  Chest radiograph dated 03/05/2019 FINDINGS: Bilateral mid to lower lung field opacities most concerning for pneumonia. Clinical correlation and follow-up to resolution recommended. There is no large pleural effusion. No pneumothorax. Stable mild cardiomegaly. Median sternotomy wires and CABG vascular clips. Left pectoral pacemaker device. No acute osseous pathology. IMPRESSION: Bilateral mid to lower lung field opacities most concerning for pneumonia. Clinical correlation and follow-up to resolution recommended. Electronically Signed   By: Anner Crete M.D.   On: 04/09/2019 18:49       Subjective: Patient seen and examined at the bedside this morning.  Hemodynamically stable for discharge today to home.  Discharge Exam: Vitals:   04/13/19 0132 04/13/19 0816  BP: (!) 123/52 (!) 153/71  Pulse: 60 60  Resp:    Temp:    SpO2:     Vitals:   04/12/19 1241 04/12/19 2109 04/13/19 0132 04/13/19 0816  BP: (!) 164/71 (!) 163/69 (!) 123/52 (!) 153/71  Pulse: (!) 59 60 60 60  Resp: 20 19    Temp: 97.9 F (36.6 C) 98 F (36.7 C)    TempSrc: Oral Oral    SpO2: 100% 100%    Weight:      Height:        General: Pt is alert, awake, not in acute  distress Cardiovascular: RRR, S1/S2 +, no rubs, no gallops Respiratory: CTA bilaterally, no wheezing, no rhonchi Abdominal: Soft, NT, ND, bowel sounds + Extremities: no edema, no cyanosis    The results of significant diagnostics from this hospitalization (including imaging, microbiology, ancillary and laboratory) are listed below for reference.     Microbiology: Recent Results (from the past 240 hour(s))  Blood culture (routine x 2)     Status: None (Preliminary result)   Collection Time: 04/09/19  6:46 PM   Specimen: Left Antecubital; Blood  Result Value Ref Range Status   Specimen Description   Final    LEFT ANTECUBITAL Performed at [facility], [place] [address] [place], [place] [address]    Special Requests   Final    BOTTLES DRAWN AEROBIC AND ANAEROBIC Blood Culture adequate volume Performed at [facility] [address] [place], [place] [address]    Culture   Final    NO  GROWTH 4 DAYS Performed at [facility], [place] [address], [place], [place] [address]    Report Status PENDING  Incomplete  Blood culture (routine x 2)     Status: None (Preliminary result)   Collection Time: 04/09/19  9:07 PM   Specimen: BLOOD  Result Value Ref Range Status   Specimen Description   Final    BLOOD LEFT ANTECUBITAL Performed at [facility] [address], [place], [place] [address]    Special Requests   Final    BOTTLES DRAWN AEROBIC AND ANAEROBIC Blood Culture adequate volume Performed at [facility] [address], [place], [place] [address]    Culture   Final    NO GROWTH 3 DAYS Performed at [facility], [place] [address] [place], [place] [address]    Report Status PENDING  Incomplete  SARS CORONAVIRUS 2 (TAT 6-24 HRS) Nasopharyngeal Nasopharyngeal Swab     Status: None   Collection Time: 04/09/19 10:35 PM   Specimen: Nasopharyngeal Swab  Result Value Ref Range Status   SARS  Coronavirus 2 NEGATIVE NEGATIVE Final    Comment: (NOTE) SARS-CoV-2 target nucleic acids are NOT DETECTED. The SARS-CoV-2 RNA is generally detectable in upper and lower respiratory specimens during the acute phase of infection. Negative results do not preclude SARS-CoV-2 infection, do not rule out co-infections with other pathogens, and should not be used as the sole basis for treatment or other patient management decisions. Negative results must be combined with clinical observations, patient history, and epidemiological information. The expected result is Negative. Fact Sheet for Patients: SugarRoll.be Fact Sheet for Healthcare Providers: https://www.woods-mathews.com/ This test is not yet approved or cleared by the [place] FDA and  has been authorized for detection and/or diagnosis of SARS-CoV-2 by FDA under an Emergency Use Authorization (EUA). This EUA will remain  in effect (meaning this test can be used) for the duration of the COVID-19 declaration under Section 56 4(b)(1) of the Act, 21 U.S.C. section 360bbb-3(b)(1), unless the authorization is terminated or revoked sooner. Performed at [facility], [place] [address] [place], [place] [address]      Labs: BNP (last 3 results) Recent Labs    10/23/18 2118 02/22/19 2348 04/09/19 1846  BNP 906.8* 577.5* 401.0*   Basic Metabolic Panel: Recent Labs  Lab 04/09/19 1846 04/10/19 0134 04/11/19 0829 04/12/19 0356 04/13/19 0346  NA 136 139 143 142 143  K 4.2 3.9 4.3 3.9 3.8  CL 102 102 105 101 101  CO2 25 27 28 29 30   GLUCOSE 188* 233* 107* 97 119*  BUN 44* 44* 42* 45* 51*  CREATININE 1.99* 2.17* 1.87* 1.87* 1.98*  CALCIUM 8.3* 8.4* 8.6* 8.7* 8.6*   Liver Function Tests: Recent Labs  Lab 04/09/19 1846  AST 15  ALT 10  ALKPHOS 113  BILITOT 0.8  PROT 6.9  ALBUMIN 3.1*   No results for input(s): LIPASE, AMYLASE in the last 168 hours. No results for input(s):  AMMONIA in the last 168 hours. CBC: Recent Labs  Lab 04/09/19 1846 04/10/19 0134 04/11/19 0829 04/13/19 0346  WBC 11.0* 11.5* 8.8 6.8  NEUTROABS 7.3  --  4.6 3.7  HGB 8.1* 7.9* 8.2* 7.2*  HCT 27.0* 26.0*  24.8* 27.9* 24.0*  MCV 112.5* 112.6* 114.3* 111.1*  PLT 264 267 250 214   Cardiac Enzymes: No results for input(s): CKTOTAL, CKMB, CKMBINDEX, TROPONINI in the last 168 hours. BNP: Invalid input(s): POCBNP CBG: Recent Labs  Lab  04/12/19 1118 04/12/19 1707 04/12/19 2130 04/13/19 0442 04/13/19 0811  GLUCAP 172* 152* 177* 103* 90   D-Dimer No results for input(s): DDIMER in the last 72 hours. Hgb A1c No results for input(s): HGBA1C in the last 72 hours. Lipid Profile No results for input(s): CHOL, HDL, LDLCALC, TRIG, CHOLHDL, LDLDIRECT in the last 72 hours. Thyroid function studies No results for input(s): TSH, T4TOTAL, T3FREE, THYROIDAB in the last 72 hours.  Invalid input(s): FREET3 Anemia work up No results for input(s): VITAMINB12, FOLATE, FERRITIN, TIBC, IRON, RETICCTPCT in the last 72 hours. Urinalysis    Component Value Date/Time   COLORURINE STRAW (A) 04/10/2019 0104   APPEARANCEUR CLEAR 04/10/2019 0104   LABSPEC 1.008 04/10/2019 0104   PHURINE 6.0 04/10/2019 0104   GLUCOSEU 50 (A) 04/10/2019 0104   HGBUR NEGATIVE 04/10/2019 0104   BILIRUBINUR NEGATIVE 04/10/2019 0104   KETONESUR NEGATIVE 04/10/2019 0104   PROTEINUR NEGATIVE 04/10/2019 0104   UROBILINOGEN 1.0 02/19/2015 2354   NITRITE NEGATIVE 04/10/2019 0104   LEUKOCYTESUR NEGATIVE 04/10/2019 0104   Sepsis Labs Invalid input(s): PROCALCITONIN,  WBC,  LACTICIDVEN Microbiology Recent Results (from the past 240 hour(s))  Blood culture (routine x 2)     Status: None (Preliminary result)   Collection Time: 04/09/19  6:46 PM   Specimen: Left Antecubital; Blood  Result Value Ref Range Status   Specimen Description   Final    LEFT ANTECUBITAL Performed at [facility], [place]  [address] [place], [place] [address]    Special Requests   Final    BOTTLES DRAWN AEROBIC AND ANAEROBIC Blood Culture adequate volume Performed at [facility] [address], [place], [place] [address]    Culture   Final    NO GROWTH 4 DAYS Performed at [facility], [place] [address], [place], [place] [address]    Report Status PENDING  Incomplete  Blood culture (routine x 2)     Status: None (Preliminary result)   Collection Time: 04/09/19  9:07 PM   Specimen: BLOOD  Result Value Ref Range Status   Specimen Description   Final    BLOOD LEFT ANTECUBITAL Performed at [facility] [address] [place], [place] [address]    Special Requests   Final    BOTTLES DRAWN AEROBIC AND ANAEROBIC Blood Culture adequate volume Performed at [facility] [address], [place], [place] [address]    Culture   Final    NO GROWTH 3 DAYS Performed at [facility], [place] [address] [place], [place] [address]    Report Status PENDING  Incomplete  SARS CORONAVIRUS 2 (TAT 6-24 HRS) Nasopharyngeal Nasopharyngeal Swab     Status: None   Collection Time: 04/09/19 10:35 PM   Specimen: Nasopharyngeal Swab  Result Value Ref Range Status   SARS Coronavirus 2 NEGATIVE NEGATIVE Final    Comment: (NOTE) SARS-CoV-2 target nucleic acids are NOT DETECTED. The SARS-CoV-2 RNA is generally detectable in upper and lower respiratory specimens during the acute phase of infection. Negative results do not preclude SARS-CoV-2 infection, do not rule out co-infections with other pathogens, and should not be used as the sole basis for treatment or other patient management decisions. Negative results must be combined with clinical observations, patient history, and epidemiological information. The expected result is Negative. Fact Sheet for Patients: SugarRoll.be Fact Sheet for Healthcare  Providers: https://www.woods-mathews.com/ This test is not yet approved or cleared by the [place] FDA and  has  been authorized for detection and/or diagnosis of SARS-CoV-2 by FDA under an Emergency Use Authorization (EUA). This EUA will remain  in effect (meaning this test can be used) for the duration of the COVID-19 declaration under Section 56 4(b)(1) of the Act, 21 U.S.C. section 360bbb-3(b)(1), unless the authorization is terminated or revoked sooner. Performed at [facility], [place] [address], [place], [place] [address]     Please note: You were cared for by a hospitalist during your hospital stay. Once you are discharged, your primary care physician will handle any further medical issues. Please note that NO REFILLS for any discharge medications will be authorized once you are discharged, as it is imperative that you return to your primary care physician (or establish a relationship with a primary care physician if you do not have one) for your post hospital discharge needs so that they can reassess your need for medications and monitor your lab values.    Time coordinating discharge: 40 minutes  SIGNED:   Shelly Coss, MD  [facility] 04/13/2019, 11:26 AM Pager [phone]  If 7PM-7AM, please contact night-coverage [email or web address] Password [email or web address]

## 2019-04-13 NOTE — ED Notes (Signed)
Social work is arranging oxygen to be transported to the home.

## 2019-04-13 NOTE — Progress Notes (Signed)
Pt discharged earlier around 12:00pm. [employer] was scheduled to deliver oxygen to home and pt was discharged via [facility]. CN just received call from wife stating the oxygen was never delivered and [facility] cannot leave the pt without oxygen. Pt was transported back to ED due to low oxygen levels. SW was informed and currently trying to arrange for oxygen to be delivered to the house.

## 2019-04-14 LAB — CULTURE, BLOOD (ROUTINE X 2)
Culture: NO GROWTH
Special Requests: ADEQUATE

## 2019-04-15 LAB — CULTURE, BLOOD (ROUTINE X 2)
Culture: NO GROWTH
Special Requests: ADEQUATE

## 2019-04-18 DIAGNOSIS — G20 Parkinson's disease: Secondary | ICD-10-CM | POA: Diagnosis not present

## 2019-04-18 DIAGNOSIS — M6281 Muscle weakness (generalized): Secondary | ICD-10-CM | POA: Diagnosis not present

## 2019-04-18 DIAGNOSIS — Z950 Presence of cardiac pacemaker: Secondary | ICD-10-CM | POA: Diagnosis not present

## 2019-04-18 DIAGNOSIS — I509 Heart failure, unspecified: Secondary | ICD-10-CM | POA: Diagnosis not present

## 2019-04-18 DIAGNOSIS — E1140 Type 2 diabetes mellitus with diabetic neuropathy, unspecified: Secondary | ICD-10-CM | POA: Diagnosis not present

## 2019-04-18 DIAGNOSIS — E119 Type 2 diabetes mellitus without complications: Secondary | ICD-10-CM | POA: Diagnosis not present

## 2019-04-18 DIAGNOSIS — I2510 Atherosclerotic heart disease of native coronary artery without angina pectoris: Secondary | ICD-10-CM | POA: Diagnosis not present

## 2019-04-18 DIAGNOSIS — R269 Unspecified abnormalities of gait and mobility: Secondary | ICD-10-CM | POA: Diagnosis not present

## 2019-04-18 DIAGNOSIS — I10 Essential (primary) hypertension: Secondary | ICD-10-CM | POA: Diagnosis not present

## 2019-04-20 DIAGNOSIS — E119 Type 2 diabetes mellitus without complications: Secondary | ICD-10-CM | POA: Diagnosis not present

## 2019-04-20 DIAGNOSIS — I10 Essential (primary) hypertension: Secondary | ICD-10-CM | POA: Diagnosis not present

## 2019-04-20 DIAGNOSIS — Z9981 Dependence on supplemental oxygen: Secondary | ICD-10-CM | POA: Diagnosis not present

## 2019-04-20 DIAGNOSIS — R269 Unspecified abnormalities of gait and mobility: Secondary | ICD-10-CM | POA: Diagnosis not present

## 2019-04-20 DIAGNOSIS — R2681 Unsteadiness on feet: Secondary | ICD-10-CM | POA: Diagnosis not present

## 2019-04-20 DIAGNOSIS — I509 Heart failure, unspecified: Secondary | ICD-10-CM | POA: Diagnosis not present

## 2019-04-20 DIAGNOSIS — G20 Parkinson's disease: Secondary | ICD-10-CM | POA: Diagnosis not present

## 2019-04-20 DIAGNOSIS — E1140 Type 2 diabetes mellitus with diabetic neuropathy, unspecified: Secondary | ICD-10-CM | POA: Diagnosis not present

## 2019-04-20 DIAGNOSIS — D649 Anemia, unspecified: Secondary | ICD-10-CM | POA: Diagnosis not present

## 2019-04-20 DIAGNOSIS — R413 Other amnesia: Secondary | ICD-10-CM | POA: Diagnosis not present

## 2019-04-20 DIAGNOSIS — Z950 Presence of cardiac pacemaker: Secondary | ICD-10-CM | POA: Diagnosis not present

## 2019-04-20 DIAGNOSIS — I2510 Atherosclerotic heart disease of native coronary artery without angina pectoris: Secondary | ICD-10-CM | POA: Diagnosis not present

## 2019-04-20 DIAGNOSIS — J189 Pneumonia, unspecified organism: Secondary | ICD-10-CM | POA: Diagnosis not present

## 2019-04-20 DIAGNOSIS — M6281 Muscle weakness (generalized): Secondary | ICD-10-CM | POA: Diagnosis not present

## 2019-04-20 DIAGNOSIS — F0391 Unspecified dementia with behavioral disturbance: Secondary | ICD-10-CM | POA: Diagnosis not present

## 2019-04-21 DIAGNOSIS — Z20828 Contact with and (suspected) exposure to other viral communicable diseases: Secondary | ICD-10-CM | POA: Diagnosis not present

## 2019-04-21 DIAGNOSIS — Z1159 Encounter for screening for other viral diseases: Secondary | ICD-10-CM | POA: Diagnosis not present

## 2019-04-23 DIAGNOSIS — Z950 Presence of cardiac pacemaker: Secondary | ICD-10-CM | POA: Diagnosis not present

## 2019-04-23 DIAGNOSIS — I2510 Atherosclerotic heart disease of native coronary artery without angina pectoris: Secondary | ICD-10-CM | POA: Diagnosis not present

## 2019-04-23 DIAGNOSIS — I10 Essential (primary) hypertension: Secondary | ICD-10-CM | POA: Diagnosis not present

## 2019-04-23 DIAGNOSIS — M6281 Muscle weakness (generalized): Secondary | ICD-10-CM | POA: Diagnosis not present

## 2019-04-23 DIAGNOSIS — I509 Heart failure, unspecified: Secondary | ICD-10-CM | POA: Diagnosis not present

## 2019-04-23 DIAGNOSIS — G20 Parkinson's disease: Secondary | ICD-10-CM | POA: Diagnosis not present

## 2019-04-23 DIAGNOSIS — R269 Unspecified abnormalities of gait and mobility: Secondary | ICD-10-CM | POA: Diagnosis not present

## 2019-04-23 DIAGNOSIS — E1140 Type 2 diabetes mellitus with diabetic neuropathy, unspecified: Secondary | ICD-10-CM | POA: Diagnosis not present

## 2019-04-23 DIAGNOSIS — E119 Type 2 diabetes mellitus without complications: Secondary | ICD-10-CM | POA: Diagnosis not present

## 2019-04-24 DIAGNOSIS — E878 Other disorders of electrolyte and fluid balance, not elsewhere classified: Secondary | ICD-10-CM | POA: Diagnosis not present

## 2019-04-24 DIAGNOSIS — D649 Anemia, unspecified: Secondary | ICD-10-CM | POA: Diagnosis not present

## 2019-04-26 DIAGNOSIS — M6281 Muscle weakness (generalized): Secondary | ICD-10-CM | POA: Diagnosis not present

## 2019-04-26 DIAGNOSIS — G20 Parkinson's disease: Secondary | ICD-10-CM | POA: Diagnosis not present

## 2019-04-26 DIAGNOSIS — I10 Essential (primary) hypertension: Secondary | ICD-10-CM | POA: Diagnosis not present

## 2019-04-26 DIAGNOSIS — E119 Type 2 diabetes mellitus without complications: Secondary | ICD-10-CM | POA: Diagnosis not present

## 2019-04-26 DIAGNOSIS — E1140 Type 2 diabetes mellitus with diabetic neuropathy, unspecified: Secondary | ICD-10-CM | POA: Diagnosis not present

## 2019-04-26 DIAGNOSIS — R269 Unspecified abnormalities of gait and mobility: Secondary | ICD-10-CM | POA: Diagnosis not present

## 2019-04-26 DIAGNOSIS — I2510 Atherosclerotic heart disease of native coronary artery without angina pectoris: Secondary | ICD-10-CM | POA: Diagnosis not present

## 2019-04-26 DIAGNOSIS — I509 Heart failure, unspecified: Secondary | ICD-10-CM | POA: Diagnosis not present

## 2019-04-26 DIAGNOSIS — Z950 Presence of cardiac pacemaker: Secondary | ICD-10-CM | POA: Diagnosis not present

## 2019-04-30 DIAGNOSIS — G20 Parkinson's disease: Secondary | ICD-10-CM | POA: Diagnosis not present

## 2019-04-30 DIAGNOSIS — E119 Type 2 diabetes mellitus without complications: Secondary | ICD-10-CM | POA: Diagnosis not present

## 2019-04-30 DIAGNOSIS — I2510 Atherosclerotic heart disease of native coronary artery without angina pectoris: Secondary | ICD-10-CM | POA: Diagnosis not present

## 2019-04-30 DIAGNOSIS — E1140 Type 2 diabetes mellitus with diabetic neuropathy, unspecified: Secondary | ICD-10-CM | POA: Diagnosis not present

## 2019-04-30 DIAGNOSIS — I509 Heart failure, unspecified: Secondary | ICD-10-CM | POA: Diagnosis not present

## 2019-04-30 DIAGNOSIS — I10 Essential (primary) hypertension: Secondary | ICD-10-CM | POA: Diagnosis not present

## 2019-04-30 DIAGNOSIS — Z20828 Contact with and (suspected) exposure to other viral communicable diseases: Secondary | ICD-10-CM | POA: Diagnosis not present

## 2019-04-30 DIAGNOSIS — Z950 Presence of cardiac pacemaker: Secondary | ICD-10-CM | POA: Diagnosis not present

## 2019-04-30 DIAGNOSIS — R269 Unspecified abnormalities of gait and mobility: Secondary | ICD-10-CM | POA: Diagnosis not present

## 2019-04-30 DIAGNOSIS — M6281 Muscle weakness (generalized): Secondary | ICD-10-CM | POA: Diagnosis not present

## 2019-04-30 DIAGNOSIS — Z1159 Encounter for screening for other viral diseases: Secondary | ICD-10-CM | POA: Diagnosis not present

## 2019-05-02 DIAGNOSIS — M6281 Muscle weakness (generalized): Secondary | ICD-10-CM | POA: Diagnosis not present

## 2019-05-02 DIAGNOSIS — G20 Parkinson's disease: Secondary | ICD-10-CM | POA: Diagnosis not present

## 2019-05-02 DIAGNOSIS — I2510 Atherosclerotic heart disease of native coronary artery without angina pectoris: Secondary | ICD-10-CM | POA: Diagnosis not present

## 2019-05-02 DIAGNOSIS — N183 Chronic kidney disease, stage 3 unspecified: Secondary | ICD-10-CM | POA: Diagnosis not present

## 2019-05-02 DIAGNOSIS — R269 Unspecified abnormalities of gait and mobility: Secondary | ICD-10-CM | POA: Diagnosis not present

## 2019-05-02 DIAGNOSIS — E119 Type 2 diabetes mellitus without complications: Secondary | ICD-10-CM | POA: Diagnosis not present

## 2019-05-02 DIAGNOSIS — I509 Heart failure, unspecified: Secondary | ICD-10-CM | POA: Diagnosis not present

## 2019-05-02 DIAGNOSIS — I10 Essential (primary) hypertension: Secondary | ICD-10-CM | POA: Diagnosis not present

## 2019-05-02 DIAGNOSIS — Z950 Presence of cardiac pacemaker: Secondary | ICD-10-CM | POA: Diagnosis not present

## 2019-05-02 DIAGNOSIS — D649 Anemia, unspecified: Secondary | ICD-10-CM | POA: Diagnosis not present

## 2019-05-02 DIAGNOSIS — E1140 Type 2 diabetes mellitus with diabetic neuropathy, unspecified: Secondary | ICD-10-CM | POA: Diagnosis not present

## 2019-05-02 NOTE — Progress Notes (Signed)
PPM remote 

## 2019-05-03 DIAGNOSIS — E119 Type 2 diabetes mellitus without complications: Secondary | ICD-10-CM | POA: Diagnosis not present

## 2019-05-03 DIAGNOSIS — Z950 Presence of cardiac pacemaker: Secondary | ICD-10-CM | POA: Diagnosis not present

## 2019-05-03 DIAGNOSIS — I2510 Atherosclerotic heart disease of native coronary artery without angina pectoris: Secondary | ICD-10-CM | POA: Diagnosis not present

## 2019-05-03 DIAGNOSIS — E1140 Type 2 diabetes mellitus with diabetic neuropathy, unspecified: Secondary | ICD-10-CM | POA: Diagnosis not present

## 2019-05-03 DIAGNOSIS — M6281 Muscle weakness (generalized): Secondary | ICD-10-CM | POA: Diagnosis not present

## 2019-05-03 DIAGNOSIS — G20 Parkinson's disease: Secondary | ICD-10-CM | POA: Diagnosis not present

## 2019-05-03 DIAGNOSIS — I509 Heart failure, unspecified: Secondary | ICD-10-CM | POA: Diagnosis not present

## 2019-05-03 DIAGNOSIS — I10 Essential (primary) hypertension: Secondary | ICD-10-CM | POA: Diagnosis not present

## 2019-05-03 DIAGNOSIS — R269 Unspecified abnormalities of gait and mobility: Secondary | ICD-10-CM | POA: Diagnosis not present

## 2019-05-07 DIAGNOSIS — M6281 Muscle weakness (generalized): Secondary | ICD-10-CM | POA: Diagnosis not present

## 2019-05-07 DIAGNOSIS — R269 Unspecified abnormalities of gait and mobility: Secondary | ICD-10-CM | POA: Diagnosis not present

## 2019-05-07 DIAGNOSIS — I509 Heart failure, unspecified: Secondary | ICD-10-CM | POA: Diagnosis not present

## 2019-05-07 DIAGNOSIS — Z1159 Encounter for screening for other viral diseases: Secondary | ICD-10-CM | POA: Diagnosis not present

## 2019-05-07 DIAGNOSIS — E1140 Type 2 diabetes mellitus with diabetic neuropathy, unspecified: Secondary | ICD-10-CM | POA: Diagnosis not present

## 2019-05-07 DIAGNOSIS — E119 Type 2 diabetes mellitus without complications: Secondary | ICD-10-CM | POA: Diagnosis not present

## 2019-05-07 DIAGNOSIS — I2510 Atherosclerotic heart disease of native coronary artery without angina pectoris: Secondary | ICD-10-CM | POA: Diagnosis not present

## 2019-05-07 DIAGNOSIS — I10 Essential (primary) hypertension: Secondary | ICD-10-CM | POA: Diagnosis not present

## 2019-05-07 DIAGNOSIS — Z950 Presence of cardiac pacemaker: Secondary | ICD-10-CM | POA: Diagnosis not present

## 2019-05-07 DIAGNOSIS — Z20828 Contact with and (suspected) exposure to other viral communicable diseases: Secondary | ICD-10-CM | POA: Diagnosis not present

## 2019-05-07 DIAGNOSIS — G20 Parkinson's disease: Secondary | ICD-10-CM | POA: Diagnosis not present

## 2019-05-08 DIAGNOSIS — E782 Mixed hyperlipidemia: Secondary | ICD-10-CM | POA: Diagnosis not present

## 2019-05-08 DIAGNOSIS — Z9981 Dependence on supplemental oxygen: Secondary | ICD-10-CM | POA: Diagnosis not present

## 2019-05-08 DIAGNOSIS — F0391 Unspecified dementia with behavioral disturbance: Secondary | ICD-10-CM | POA: Diagnosis not present

## 2019-05-08 DIAGNOSIS — G20 Parkinson's disease: Secondary | ICD-10-CM | POA: Diagnosis not present

## 2019-05-08 DIAGNOSIS — N183 Chronic kidney disease, stage 3 unspecified: Secondary | ICD-10-CM | POA: Diagnosis not present

## 2019-05-08 DIAGNOSIS — I2510 Atherosclerotic heart disease of native coronary artery without angina pectoris: Secondary | ICD-10-CM | POA: Diagnosis not present

## 2019-05-08 DIAGNOSIS — R609 Edema, unspecified: Secondary | ICD-10-CM | POA: Diagnosis not present

## 2019-05-08 DIAGNOSIS — E559 Vitamin D deficiency, unspecified: Secondary | ICD-10-CM | POA: Diagnosis not present

## 2019-05-08 DIAGNOSIS — C44519 Basal cell carcinoma of skin of other part of trunk: Secondary | ICD-10-CM | POA: Diagnosis not present

## 2019-05-08 DIAGNOSIS — E119 Type 2 diabetes mellitus without complications: Secondary | ICD-10-CM | POA: Diagnosis not present

## 2019-05-09 DIAGNOSIS — M6281 Muscle weakness (generalized): Secondary | ICD-10-CM | POA: Diagnosis not present

## 2019-05-09 DIAGNOSIS — R269 Unspecified abnormalities of gait and mobility: Secondary | ICD-10-CM | POA: Diagnosis not present

## 2019-05-09 DIAGNOSIS — I10 Essential (primary) hypertension: Secondary | ICD-10-CM | POA: Diagnosis not present

## 2019-05-09 DIAGNOSIS — E1140 Type 2 diabetes mellitus with diabetic neuropathy, unspecified: Secondary | ICD-10-CM | POA: Diagnosis not present

## 2019-05-09 DIAGNOSIS — I2510 Atherosclerotic heart disease of native coronary artery without angina pectoris: Secondary | ICD-10-CM | POA: Diagnosis not present

## 2019-05-09 DIAGNOSIS — G20 Parkinson's disease: Secondary | ICD-10-CM | POA: Diagnosis not present

## 2019-05-09 DIAGNOSIS — E119 Type 2 diabetes mellitus without complications: Secondary | ICD-10-CM | POA: Diagnosis not present

## 2019-05-09 DIAGNOSIS — Z950 Presence of cardiac pacemaker: Secondary | ICD-10-CM | POA: Diagnosis not present

## 2019-05-09 DIAGNOSIS — I509 Heart failure, unspecified: Secondary | ICD-10-CM | POA: Diagnosis not present

## 2019-05-10 DIAGNOSIS — I10 Essential (primary) hypertension: Secondary | ICD-10-CM | POA: Diagnosis not present

## 2019-05-10 DIAGNOSIS — G20 Parkinson's disease: Secondary | ICD-10-CM | POA: Diagnosis not present

## 2019-05-10 DIAGNOSIS — R269 Unspecified abnormalities of gait and mobility: Secondary | ICD-10-CM | POA: Diagnosis not present

## 2019-05-10 DIAGNOSIS — I509 Heart failure, unspecified: Secondary | ICD-10-CM | POA: Diagnosis not present

## 2019-05-10 DIAGNOSIS — E1140 Type 2 diabetes mellitus with diabetic neuropathy, unspecified: Secondary | ICD-10-CM | POA: Diagnosis not present

## 2019-05-10 DIAGNOSIS — E119 Type 2 diabetes mellitus without complications: Secondary | ICD-10-CM | POA: Diagnosis not present

## 2019-05-10 DIAGNOSIS — I2510 Atherosclerotic heart disease of native coronary artery without angina pectoris: Secondary | ICD-10-CM | POA: Diagnosis not present

## 2019-05-10 DIAGNOSIS — Z950 Presence of cardiac pacemaker: Secondary | ICD-10-CM | POA: Diagnosis not present

## 2019-05-10 DIAGNOSIS — M6281 Muscle weakness (generalized): Secondary | ICD-10-CM | POA: Diagnosis not present

## 2019-05-14 DIAGNOSIS — Z950 Presence of cardiac pacemaker: Secondary | ICD-10-CM | POA: Diagnosis not present

## 2019-05-14 DIAGNOSIS — G20 Parkinson's disease: Secondary | ICD-10-CM | POA: Diagnosis not present

## 2019-05-14 DIAGNOSIS — D649 Anemia, unspecified: Secondary | ICD-10-CM | POA: Diagnosis not present

## 2019-05-14 DIAGNOSIS — E1165 Type 2 diabetes mellitus with hyperglycemia: Secondary | ICD-10-CM | POA: Diagnosis not present

## 2019-05-14 DIAGNOSIS — I10 Essential (primary) hypertension: Secondary | ICD-10-CM | POA: Diagnosis not present

## 2019-05-14 DIAGNOSIS — E559 Vitamin D deficiency, unspecified: Secondary | ICD-10-CM | POA: Diagnosis not present

## 2019-05-14 DIAGNOSIS — E538 Deficiency of other specified B group vitamins: Secondary | ICD-10-CM | POA: Diagnosis not present

## 2019-05-14 DIAGNOSIS — E782 Mixed hyperlipidemia: Secondary | ICD-10-CM | POA: Diagnosis not present

## 2019-05-14 DIAGNOSIS — I509 Heart failure, unspecified: Secondary | ICD-10-CM | POA: Diagnosis not present

## 2019-05-14 DIAGNOSIS — M6281 Muscle weakness (generalized): Secondary | ICD-10-CM | POA: Diagnosis not present

## 2019-05-14 DIAGNOSIS — R269 Unspecified abnormalities of gait and mobility: Secondary | ICD-10-CM | POA: Diagnosis not present

## 2019-05-14 DIAGNOSIS — E119 Type 2 diabetes mellitus without complications: Secondary | ICD-10-CM | POA: Diagnosis not present

## 2019-05-14 DIAGNOSIS — I2510 Atherosclerotic heart disease of native coronary artery without angina pectoris: Secondary | ICD-10-CM | POA: Diagnosis not present

## 2019-05-14 DIAGNOSIS — I5032 Chronic diastolic (congestive) heart failure: Secondary | ICD-10-CM | POA: Diagnosis not present

## 2019-05-14 DIAGNOSIS — E1140 Type 2 diabetes mellitus with diabetic neuropathy, unspecified: Secondary | ICD-10-CM | POA: Diagnosis not present

## 2019-05-14 DIAGNOSIS — I5033 Acute on chronic diastolic (congestive) heart failure: Secondary | ICD-10-CM | POA: Diagnosis not present

## 2019-05-15 DIAGNOSIS — R269 Unspecified abnormalities of gait and mobility: Secondary | ICD-10-CM | POA: Diagnosis not present

## 2019-05-15 DIAGNOSIS — M6281 Muscle weakness (generalized): Secondary | ICD-10-CM | POA: Diagnosis not present

## 2019-05-15 DIAGNOSIS — I10 Essential (primary) hypertension: Secondary | ICD-10-CM | POA: Diagnosis not present

## 2019-05-15 DIAGNOSIS — Z950 Presence of cardiac pacemaker: Secondary | ICD-10-CM | POA: Diagnosis not present

## 2019-05-15 DIAGNOSIS — E1140 Type 2 diabetes mellitus with diabetic neuropathy, unspecified: Secondary | ICD-10-CM | POA: Diagnosis not present

## 2019-05-15 DIAGNOSIS — I2510 Atherosclerotic heart disease of native coronary artery without angina pectoris: Secondary | ICD-10-CM | POA: Diagnosis not present

## 2019-05-15 DIAGNOSIS — I509 Heart failure, unspecified: Secondary | ICD-10-CM | POA: Diagnosis not present

## 2019-05-15 DIAGNOSIS — G20 Parkinson's disease: Secondary | ICD-10-CM | POA: Diagnosis not present

## 2019-05-15 DIAGNOSIS — E119 Type 2 diabetes mellitus without complications: Secondary | ICD-10-CM | POA: Diagnosis not present

## 2019-05-16 DIAGNOSIS — R509 Fever, unspecified: Secondary | ICD-10-CM | POA: Diagnosis not present

## 2019-05-16 DIAGNOSIS — C4442 Squamous cell carcinoma of skin of scalp and neck: Secondary | ICD-10-CM | POA: Diagnosis not present

## 2019-05-16 DIAGNOSIS — Z950 Presence of cardiac pacemaker: Secondary | ICD-10-CM | POA: Diagnosis not present

## 2019-05-16 DIAGNOSIS — R269 Unspecified abnormalities of gait and mobility: Secondary | ICD-10-CM | POA: Diagnosis not present

## 2019-05-16 DIAGNOSIS — I10 Essential (primary) hypertension: Secondary | ICD-10-CM | POA: Diagnosis not present

## 2019-05-16 DIAGNOSIS — E119 Type 2 diabetes mellitus without complications: Secondary | ICD-10-CM | POA: Diagnosis not present

## 2019-05-16 DIAGNOSIS — R319 Hematuria, unspecified: Secondary | ICD-10-CM | POA: Diagnosis not present

## 2019-05-16 DIAGNOSIS — G20 Parkinson's disease: Secondary | ICD-10-CM | POA: Diagnosis not present

## 2019-05-16 DIAGNOSIS — M6281 Muscle weakness (generalized): Secondary | ICD-10-CM | POA: Diagnosis not present

## 2019-05-16 DIAGNOSIS — E1140 Type 2 diabetes mellitus with diabetic neuropathy, unspecified: Secondary | ICD-10-CM | POA: Diagnosis not present

## 2019-05-16 DIAGNOSIS — I509 Heart failure, unspecified: Secondary | ICD-10-CM | POA: Diagnosis not present

## 2019-05-16 DIAGNOSIS — I2510 Atherosclerotic heart disease of native coronary artery without angina pectoris: Secondary | ICD-10-CM | POA: Diagnosis not present

## 2019-05-17 ENCOUNTER — Telehealth: Payer: Self-pay | Admitting: Cardiovascular Disease

## 2019-05-17 ENCOUNTER — Telehealth: Payer: Self-pay | Admitting: Diagnostic Neuroimaging

## 2019-05-17 DIAGNOSIS — Z20828 Contact with and (suspected) exposure to other viral communicable diseases: Secondary | ICD-10-CM | POA: Diagnosis not present

## 2019-05-17 DIAGNOSIS — C61 Malignant neoplasm of prostate: Secondary | ICD-10-CM | POA: Diagnosis not present

## 2019-05-17 DIAGNOSIS — E538 Deficiency of other specified B group vitamins: Secondary | ICD-10-CM | POA: Diagnosis not present

## 2019-05-17 DIAGNOSIS — I2510 Atherosclerotic heart disease of native coronary artery without angina pectoris: Secondary | ICD-10-CM | POA: Diagnosis not present

## 2019-05-17 DIAGNOSIS — E1122 Type 2 diabetes mellitus with diabetic chronic kidney disease: Secondary | ICD-10-CM | POA: Diagnosis not present

## 2019-05-17 DIAGNOSIS — N1832 Chronic kidney disease, stage 3b: Secondary | ICD-10-CM | POA: Diagnosis not present

## 2019-05-17 DIAGNOSIS — D649 Anemia, unspecified: Secondary | ICD-10-CM | POA: Diagnosis not present

## 2019-05-17 DIAGNOSIS — E559 Vitamin D deficiency, unspecified: Secondary | ICD-10-CM | POA: Diagnosis not present

## 2019-05-17 DIAGNOSIS — Z1159 Encounter for screening for other viral diseases: Secondary | ICD-10-CM | POA: Diagnosis not present

## 2019-05-17 DIAGNOSIS — E782 Mixed hyperlipidemia: Secondary | ICD-10-CM | POA: Diagnosis not present

## 2019-05-17 NOTE — Telephone Encounter (Signed)
Received labs from PCP. Placed on Dr [employer] desk for review.

## 2019-05-17 NOTE — Telephone Encounter (Signed)
April from Dr. Drema Dallas office called stating that she will be faxing over some labs and they are for the provider to review them and then to let Dr. Drema Dallas know when does provider want to follow up with pt. Please advise.

## 2019-05-17 NOTE — Telephone Encounter (Signed)
Dr Leta Baptist reviewed labs. Called PCP, and phone rang for 5 minutes with no answer. Will attempt to call later.

## 2019-05-17 NOTE — Telephone Encounter (Signed)
Attempted to reach [name] Powell at the number provided. There was no answer.

## 2019-05-17 NOTE — Telephone Encounter (Signed)
April form Dr. Drema Dallas' office calling stating Dr. Drema Dallas would like Dr. Sallyanne Kuster to review the labs faxed over today. He would like to know when Dr. Sallyanne Kuster wants to follow up with the patient. April can be reached at [phone]

## 2019-05-18 ENCOUNTER — Other Ambulatory Visit: Payer: Self-pay | Admitting: *Deleted

## 2019-05-18 DIAGNOSIS — E119 Type 2 diabetes mellitus without complications: Secondary | ICD-10-CM | POA: Diagnosis not present

## 2019-05-18 DIAGNOSIS — I10 Essential (primary) hypertension: Secondary | ICD-10-CM | POA: Diagnosis not present

## 2019-05-18 DIAGNOSIS — Z950 Presence of cardiac pacemaker: Secondary | ICD-10-CM | POA: Diagnosis not present

## 2019-05-18 DIAGNOSIS — I509 Heart failure, unspecified: Secondary | ICD-10-CM | POA: Diagnosis not present

## 2019-05-18 DIAGNOSIS — E1140 Type 2 diabetes mellitus with diabetic neuropathy, unspecified: Secondary | ICD-10-CM | POA: Diagnosis not present

## 2019-05-18 DIAGNOSIS — I2510 Atherosclerotic heart disease of native coronary artery without angina pectoris: Secondary | ICD-10-CM | POA: Diagnosis not present

## 2019-05-18 DIAGNOSIS — G20 Parkinson's disease: Secondary | ICD-10-CM | POA: Diagnosis not present

## 2019-05-18 DIAGNOSIS — M6281 Muscle weakness (generalized): Secondary | ICD-10-CM | POA: Diagnosis not present

## 2019-05-18 DIAGNOSIS — R269 Unspecified abnormalities of gait and mobility: Secondary | ICD-10-CM | POA: Diagnosis not present

## 2019-05-21 NOTE — Telephone Encounter (Addendum)
Received office notes from Dr Altheimer, endocrinologist. Notes placed on Dr Gladstone Lighter desk. Called PCP office, unable to reach anyone after 5 minutes of holding.

## 2019-05-22 DIAGNOSIS — G20 Parkinson's disease: Secondary | ICD-10-CM | POA: Diagnosis not present

## 2019-05-22 DIAGNOSIS — M6281 Muscle weakness (generalized): Secondary | ICD-10-CM | POA: Diagnosis not present

## 2019-05-22 DIAGNOSIS — I2510 Atherosclerotic heart disease of native coronary artery without angina pectoris: Secondary | ICD-10-CM | POA: Diagnosis not present

## 2019-05-22 DIAGNOSIS — I10 Essential (primary) hypertension: Secondary | ICD-10-CM | POA: Diagnosis not present

## 2019-05-22 DIAGNOSIS — E1140 Type 2 diabetes mellitus with diabetic neuropathy, unspecified: Secondary | ICD-10-CM | POA: Diagnosis not present

## 2019-05-22 DIAGNOSIS — E119 Type 2 diabetes mellitus without complications: Secondary | ICD-10-CM | POA: Diagnosis not present

## 2019-05-22 DIAGNOSIS — I509 Heart failure, unspecified: Secondary | ICD-10-CM | POA: Diagnosis not present

## 2019-05-22 DIAGNOSIS — Z950 Presence of cardiac pacemaker: Secondary | ICD-10-CM | POA: Diagnosis not present

## 2019-05-22 DIAGNOSIS — R269 Unspecified abnormalities of gait and mobility: Secondary | ICD-10-CM | POA: Diagnosis not present

## 2019-05-23 DIAGNOSIS — M25551 Pain in right hip: Secondary | ICD-10-CM | POA: Diagnosis not present

## 2019-05-23 DIAGNOSIS — W19XXXA Unspecified fall, initial encounter: Secondary | ICD-10-CM | POA: Diagnosis not present

## 2019-05-25 DIAGNOSIS — M6281 Muscle weakness (generalized): Secondary | ICD-10-CM | POA: Diagnosis not present

## 2019-05-25 DIAGNOSIS — I509 Heart failure, unspecified: Secondary | ICD-10-CM | POA: Diagnosis not present

## 2019-05-25 DIAGNOSIS — I10 Essential (primary) hypertension: Secondary | ICD-10-CM | POA: Diagnosis not present

## 2019-05-25 DIAGNOSIS — Z950 Presence of cardiac pacemaker: Secondary | ICD-10-CM | POA: Diagnosis not present

## 2019-05-25 DIAGNOSIS — G20 Parkinson's disease: Secondary | ICD-10-CM | POA: Diagnosis not present

## 2019-05-25 DIAGNOSIS — E1140 Type 2 diabetes mellitus with diabetic neuropathy, unspecified: Secondary | ICD-10-CM | POA: Diagnosis not present

## 2019-05-25 DIAGNOSIS — E119 Type 2 diabetes mellitus without complications: Secondary | ICD-10-CM | POA: Diagnosis not present

## 2019-05-25 DIAGNOSIS — I2510 Atherosclerotic heart disease of native coronary artery without angina pectoris: Secondary | ICD-10-CM | POA: Diagnosis not present

## 2019-05-25 DIAGNOSIS — R269 Unspecified abnormalities of gait and mobility: Secondary | ICD-10-CM | POA: Diagnosis not present

## 2019-05-28 ENCOUNTER — Emergency Department (HOSPITAL_COMMUNITY): Payer: Medicare HMO

## 2019-05-28 ENCOUNTER — Inpatient Hospital Stay (HOSPITAL_COMMUNITY): Payer: Medicare HMO

## 2019-05-28 ENCOUNTER — Inpatient Hospital Stay (HOSPITAL_COMMUNITY)
Admission: EM | Admit: 2019-05-28 | Discharge: 2019-06-01 | DRG: 177 | Disposition: A | Discharge status: Home Health | Payer: Medicare HMO | Attending: Internal Medicine | Admitting: Internal Medicine

## 2019-05-28 ENCOUNTER — Encounter (HOSPITAL_COMMUNITY): Payer: Self-pay | Admitting: Emergency Medicine

## 2019-05-28 ENCOUNTER — Other Ambulatory Visit: Payer: Self-pay

## 2019-05-28 DIAGNOSIS — Z7984 Long term (current) use of oral hypoglycemic drugs: Secondary | ICD-10-CM | POA: Diagnosis not present

## 2019-05-28 DIAGNOSIS — N179 Acute kidney failure, unspecified: Secondary | ICD-10-CM | POA: Diagnosis not present

## 2019-05-28 DIAGNOSIS — D649 Anemia, unspecified: Secondary | ICD-10-CM

## 2019-05-28 DIAGNOSIS — Z8546 Personal history of malignant neoplasm of prostate: Secondary | ICD-10-CM | POA: Diagnosis not present

## 2019-05-28 DIAGNOSIS — G9340 Encephalopathy, unspecified: Secondary | ICD-10-CM | POA: Diagnosis not present

## 2019-05-28 DIAGNOSIS — W19XXXA Unspecified fall, initial encounter: Secondary | ICD-10-CM | POA: Diagnosis present

## 2019-05-28 DIAGNOSIS — I442 Atrioventricular block, complete: Secondary | ICD-10-CM | POA: Diagnosis present

## 2019-05-28 DIAGNOSIS — J189 Pneumonia, unspecified organism: Secondary | ICD-10-CM | POA: Diagnosis not present

## 2019-05-28 DIAGNOSIS — Z8673 Personal history of transient ischemic attack (TIA), and cerebral infarction without residual deficits: Secondary | ICD-10-CM

## 2019-05-28 DIAGNOSIS — I4892 Unspecified atrial flutter: Secondary | ICD-10-CM | POA: Diagnosis present

## 2019-05-28 DIAGNOSIS — I5032 Chronic diastolic (congestive) heart failure: Secondary | ICD-10-CM | POA: Diagnosis not present

## 2019-05-28 DIAGNOSIS — I2510 Atherosclerotic heart disease of native coronary artery without angina pectoris: Secondary | ICD-10-CM | POA: Diagnosis present

## 2019-05-28 DIAGNOSIS — Z91011 Allergy to milk products: Secondary | ICD-10-CM

## 2019-05-28 DIAGNOSIS — R531 Weakness: Secondary | ICD-10-CM

## 2019-05-28 DIAGNOSIS — I4891 Unspecified atrial fibrillation: Secondary | ICD-10-CM | POA: Diagnosis not present

## 2019-05-28 DIAGNOSIS — J690 Pneumonitis due to inhalation of food and vomit: Secondary | ICD-10-CM | POA: Diagnosis not present

## 2019-05-28 DIAGNOSIS — Z8249 Family history of ischemic heart disease and other diseases of the circulatory system: Secondary | ICD-10-CM

## 2019-05-28 DIAGNOSIS — Z20822 Contact with and (suspected) exposure to covid-19: Secondary | ICD-10-CM | POA: Diagnosis present

## 2019-05-28 DIAGNOSIS — R1310 Dysphagia, unspecified: Secondary | ICD-10-CM | POA: Diagnosis not present

## 2019-05-28 DIAGNOSIS — E1122 Type 2 diabetes mellitus with diabetic chronic kidney disease: Secondary | ICD-10-CM | POA: Diagnosis present

## 2019-05-28 DIAGNOSIS — Z888 Allergy status to other drugs, medicaments and biological substances status: Secondary | ICD-10-CM

## 2019-05-28 DIAGNOSIS — R52 Pain, unspecified: Secondary | ICD-10-CM | POA: Diagnosis not present

## 2019-05-28 DIAGNOSIS — E860 Dehydration: Secondary | ICD-10-CM | POA: Diagnosis present

## 2019-05-28 DIAGNOSIS — E1151 Type 2 diabetes mellitus with diabetic peripheral angiopathy without gangrene: Secondary | ICD-10-CM | POA: Diagnosis present

## 2019-05-28 DIAGNOSIS — Z87891 Personal history of nicotine dependence: Secondary | ICD-10-CM | POA: Diagnosis not present

## 2019-05-28 DIAGNOSIS — Z7401 Bed confinement status: Secondary | ICD-10-CM | POA: Diagnosis not present

## 2019-05-28 DIAGNOSIS — S79911A Unspecified injury of right hip, initial encounter: Secondary | ICD-10-CM | POA: Diagnosis not present

## 2019-05-28 DIAGNOSIS — I10 Essential (primary) hypertension: Secondary | ICD-10-CM | POA: Diagnosis present

## 2019-05-28 DIAGNOSIS — I4821 Permanent atrial fibrillation: Secondary | ICD-10-CM | POA: Diagnosis not present

## 2019-05-28 DIAGNOSIS — Z951 Presence of aortocoronary bypass graft: Secondary | ICD-10-CM | POA: Diagnosis not present

## 2019-05-28 DIAGNOSIS — R627 Adult failure to thrive: Secondary | ICD-10-CM | POA: Diagnosis present

## 2019-05-28 DIAGNOSIS — M25551 Pain in right hip: Secondary | ICD-10-CM | POA: Diagnosis present

## 2019-05-28 DIAGNOSIS — Z515 Encounter for palliative care: Secondary | ICD-10-CM | POA: Diagnosis not present

## 2019-05-28 DIAGNOSIS — E785 Hyperlipidemia, unspecified: Secondary | ICD-10-CM | POA: Diagnosis present

## 2019-05-28 DIAGNOSIS — D62 Acute posthemorrhagic anemia: Secondary | ICD-10-CM | POA: Diagnosis present

## 2019-05-28 DIAGNOSIS — Z794 Long term (current) use of insulin: Secondary | ICD-10-CM

## 2019-05-28 DIAGNOSIS — Z7901 Long term (current) use of anticoagulants: Secondary | ICD-10-CM

## 2019-05-28 DIAGNOSIS — N1832 Chronic kidney disease, stage 3b: Secondary | ICD-10-CM | POA: Diagnosis present

## 2019-05-28 DIAGNOSIS — J9601 Acute respiratory failure with hypoxia: Secondary | ICD-10-CM | POA: Diagnosis not present

## 2019-05-28 DIAGNOSIS — R630 Anorexia: Secondary | ICD-10-CM | POA: Diagnosis not present

## 2019-05-28 DIAGNOSIS — M4812 Ankylosing hyperostosis [Forestier], cervical region: Secondary | ICD-10-CM | POA: Diagnosis present

## 2019-05-28 DIAGNOSIS — M25572 Pain in left ankle and joints of left foot: Secondary | ICD-10-CM | POA: Diagnosis not present

## 2019-05-28 DIAGNOSIS — D631 Anemia in chronic kidney disease: Secondary | ICD-10-CM | POA: Diagnosis present

## 2019-05-28 DIAGNOSIS — Y95 Nosocomial condition: Secondary | ICD-10-CM

## 2019-05-28 DIAGNOSIS — S7001XA Contusion of right hip, initial encounter: Secondary | ICD-10-CM | POA: Diagnosis present

## 2019-05-28 DIAGNOSIS — I480 Paroxysmal atrial fibrillation: Secondary | ICD-10-CM | POA: Diagnosis not present

## 2019-05-28 DIAGNOSIS — G20 Parkinson's disease: Secondary | ICD-10-CM | POA: Diagnosis present

## 2019-05-28 DIAGNOSIS — R0902 Hypoxemia: Secondary | ICD-10-CM | POA: Diagnosis not present

## 2019-05-28 DIAGNOSIS — S299XXA Unspecified injury of thorax, initial encounter: Secondary | ICD-10-CM | POA: Diagnosis not present

## 2019-05-28 DIAGNOSIS — E119 Type 2 diabetes mellitus without complications: Secondary | ICD-10-CM

## 2019-05-28 DIAGNOSIS — I130 Hypertensive heart and chronic kidney disease with heart failure and stage 1 through stage 4 chronic kidney disease, or unspecified chronic kidney disease: Secondary | ICD-10-CM | POA: Diagnosis not present

## 2019-05-28 DIAGNOSIS — Z950 Presence of cardiac pacemaker: Secondary | ICD-10-CM

## 2019-05-28 DIAGNOSIS — Z7189 Other specified counseling: Secondary | ICD-10-CM

## 2019-05-28 DIAGNOSIS — M2550 Pain in unspecified joint: Secondary | ICD-10-CM | POA: Diagnosis not present

## 2019-05-28 DIAGNOSIS — I959 Hypotension, unspecified: Secondary | ICD-10-CM | POA: Diagnosis not present

## 2019-05-28 DIAGNOSIS — F0280 Dementia in other diseases classified elsewhere without behavioral disturbance: Secondary | ICD-10-CM | POA: Diagnosis present

## 2019-05-28 DIAGNOSIS — R404 Transient alteration of awareness: Secondary | ICD-10-CM | POA: Diagnosis not present

## 2019-05-28 DIAGNOSIS — R4182 Altered mental status, unspecified: Secondary | ICD-10-CM | POA: Diagnosis not present

## 2019-05-28 DIAGNOSIS — R6251 Failure to thrive (child): Secondary | ICD-10-CM

## 2019-05-28 DIAGNOSIS — I469 Cardiac arrest, cause unspecified: Secondary | ICD-10-CM | POA: Diagnosis not present

## 2019-05-28 LAB — URINALYSIS, ROUTINE W REFLEX MICROSCOPIC
Bilirubin Urine: NEGATIVE
Glucose, UA: NEGATIVE mg/dL
Hgb urine dipstick: NEGATIVE
Ketones, ur: NEGATIVE mg/dL
Leukocytes,Ua: NEGATIVE
Nitrite: NEGATIVE
Protein, ur: 30 mg/dL — AB
Specific Gravity, Urine: 1.014 (ref 1.005–1.030)
pH: 5 (ref 5.0–8.0)

## 2019-05-28 LAB — CBC WITH DIFFERENTIAL/PLATELET
Abs Immature Granulocytes: 0.21 10*3/uL — ABNORMAL HIGH (ref 0.00–0.07)
Basophils Absolute: 0 10*3/uL (ref 0.0–0.1)
Basophils Relative: 0 %
Eosinophils Absolute: 0 10*3/uL (ref 0.0–0.5)
Eosinophils Relative: 0 %
HCT: 23.3 % — ABNORMAL LOW (ref 39.0–52.0)
Hemoglobin: 7 g/dL — ABNORMAL LOW (ref 13.0–17.0)
Immature Granulocytes: 2 %
Lymphocytes Relative: 11 %
Lymphs Abs: 1.2 10*3/uL (ref 0.7–4.0)
MCH: 34.1 pg — ABNORMAL HIGH (ref 26.0–34.0)
MCHC: 30 g/dL (ref 30.0–36.0)
MCV: 113.7 fL — ABNORMAL HIGH (ref 80.0–100.0)
Monocytes Absolute: 3.4 10*3/uL — ABNORMAL HIGH (ref 0.1–1.0)
Monocytes Relative: 29 %
Neutro Abs: 6.6 10*3/uL (ref 1.7–7.7)
Neutrophils Relative %: 58 %
Platelets: 193 10*3/uL (ref 150–400)
RBC: 2.05 MIL/uL — ABNORMAL LOW (ref 4.22–5.81)
RDW: 17.6 % — ABNORMAL HIGH (ref 11.5–15.5)
WBC: 11.4 10*3/uL — ABNORMAL HIGH (ref 4.0–10.5)
nRBC: 0.2 % (ref 0.0–0.2)

## 2019-05-28 LAB — COMPREHENSIVE METABOLIC PANEL
ALT: 7 U/L (ref 0–44)
AST: 15 U/L (ref 15–41)
Albumin: 3.1 g/dL — ABNORMAL LOW (ref 3.5–5.0)
Alkaline Phosphatase: 107 U/L (ref 38–126)
Anion gap: 9 (ref 5–15)
BUN: 59 mg/dL — ABNORMAL HIGH (ref 8–23)
CO2: 25 mmol/L (ref 22–32)
Calcium: 8.4 mg/dL — ABNORMAL LOW (ref 8.9–10.3)
Chloride: 104 mmol/L (ref 98–111)
Creatinine, Ser: 2.97 mg/dL — ABNORMAL HIGH (ref 0.61–1.24)
GFR calc Af Amer: 20 mL/min — ABNORMAL LOW (ref >60)
GFR calc non Af Amer: 17 mL/min — ABNORMAL LOW (ref >60)
Glucose, Bld: 188 mg/dL — ABNORMAL HIGH (ref 70–99)
Potassium: 5.4 mmol/L — ABNORMAL HIGH (ref 3.5–5.1)
Sodium: 138 mmol/L (ref 135–145)
Total Bilirubin: 0.5 mg/dL (ref 0.3–1.2)
Total Protein: 7 g/dL (ref 6.5–8.1)

## 2019-05-28 LAB — IRON AND TIBC
Iron: 17 ug/dL — ABNORMAL LOW (ref 45–182)
Saturation Ratios: 6 % — ABNORMAL LOW (ref 17.9–39.5)
TIBC: 305 ug/dL (ref 250–450)
UIBC: 288 ug/dL

## 2019-05-28 LAB — PROCALCITONIN: Procalcitonin: <0.1 ng/mL

## 2019-05-28 LAB — PREPARE RBC (CROSSMATCH)

## 2019-05-28 LAB — STREP PNEUMONIAE URINARY ANTIGEN: Strep Pneumo Urinary Antigen: NEGATIVE

## 2019-05-28 LAB — C-REACTIVE PROTEIN: CRP: 6.9 mg/dL — ABNORMAL HIGH (ref <1.0)

## 2019-05-28 LAB — RETICULOCYTES
Immature Retic Fract: 32.2 % — ABNORMAL HIGH (ref 2.3–15.9)
RBC.: 2.03 MIL/uL — ABNORMAL LOW (ref 4.22–5.81)
Retic Count, Absolute: 56.8 10*3/uL (ref 19.0–186.0)
Retic Ct Pct: 2.8 % (ref 0.4–3.1)

## 2019-05-28 LAB — VITAMIN B12: Vitamin B-12: 746 pg/mL (ref 180–914)

## 2019-05-28 LAB — FOLATE: Folate: 12.5 ng/mL (ref >5.9)

## 2019-05-28 LAB — PROTIME-INR
INR: 1.5 — ABNORMAL HIGH (ref 0.8–1.2)
Prothrombin Time: 17.8 seconds — ABNORMAL HIGH (ref 11.4–15.2)

## 2019-05-28 LAB — RESPIRATORY PANEL BY RT PCR (FLU A&B, COVID)
Influenza A by PCR: NEGATIVE
Influenza B by PCR: NEGATIVE
SARS Coronavirus 2 by RT PCR: NEGATIVE

## 2019-05-28 LAB — LACTIC ACID, PLASMA: Lactic Acid, Venous: 2.5 mmol/L (ref 0.5–1.9)

## 2019-05-28 LAB — FERRITIN: Ferritin: 120 ng/mL (ref 24–336)

## 2019-05-28 LAB — D-DIMER, QUANTITATIVE: D-Dimer, Quant: 1.77 ug/mL-FEU — ABNORMAL HIGH (ref 0.00–0.50)

## 2019-05-28 LAB — ABO/RH: ABO/RH(D): O NEG

## 2019-05-28 MED ORDER — SODIUM CHLORIDE 0.9 % IV SOLN: INTRAVENOUS | Status: DC

## 2019-05-28 MED ORDER — VANCOMYCIN HCL IN DEXTROSE 1-5 GM/200ML-% IV SOLN
1000.0000 mg | Freq: Once | INTRAVENOUS | Status: AC
  Administered 2019-05-28: 1000 mg via INTRAVENOUS
  Filled 2019-05-28: qty 200

## 2019-05-28 MED ORDER — CARBIDOPA-LEVODOPA 25-100 MG PO TABS
1.0000 | ORAL_TABLET | Freq: Three times a day (TID) | ORAL | Status: DC
  Administered 2019-05-28 – 2019-06-01 (×11): 1 via ORAL
  Filled 2019-05-28 (×13): qty 1

## 2019-05-28 MED ORDER — SODIUM CHLORIDE 0.9% IV SOLUTION: Freq: Once | INTRAVENOUS | Status: AC

## 2019-05-28 MED ORDER — QUETIAPINE FUMARATE 25 MG PO TABS
12.5000 mg | ORAL_TABLET | Freq: Every day | ORAL | Status: DC
  Administered 2019-05-29 – 2019-05-31 (×4): 12.5 mg via ORAL
  Filled 2019-05-28 (×4): qty 1

## 2019-05-28 MED ORDER — ATORVASTATIN CALCIUM 20 MG PO TABS
20.0000 mg | ORAL_TABLET | Freq: Every day | ORAL | Status: DC
  Administered 2019-05-28 – 2019-06-01 (×5): 20 mg via ORAL
  Filled 2019-05-28 (×5): qty 1

## 2019-05-28 MED ORDER — APIXABAN 2.5 MG PO TABS
2.5000 mg | ORAL_TABLET | Freq: Two times a day (BID) | ORAL | Status: DC
  Administered 2019-05-28 – 2019-05-29 (×2): 2.5 mg via ORAL
  Filled 2019-05-28 (×3): qty 1

## 2019-05-28 MED ORDER — VANCOMYCIN HCL 750 MG/150ML IV SOLN
750.0000 mg | INTRAVENOUS | Status: DC
  Filled 2019-05-28: qty 150

## 2019-05-28 MED ORDER — SODIUM CHLORIDE 0.9 % IV SOLN
2.0000 g | INTRAVENOUS | Status: DC
  Administered 2019-05-29 – 2019-06-01 (×4): 2 g via INTRAVENOUS
  Filled 2019-05-28 (×5): qty 2

## 2019-05-28 MED ORDER — SODIUM ZIRCONIUM CYCLOSILICATE 10 G PO PACK
10.0000 g | PACK | Freq: Once | ORAL | Status: AC
  Administered 2019-05-28: 10 g via ORAL
  Filled 2019-05-28: qty 1

## 2019-05-28 MED ORDER — SODIUM CHLORIDE 0.9 % IV SOLN
1.0000 g | Freq: Once | INTRAVENOUS | Status: AC
  Administered 2019-05-28: 1 g via INTRAVENOUS
  Filled 2019-05-28: qty 1

## 2019-05-28 MED ORDER — METOPROLOL TARTRATE 25 MG PO TABS
25.0000 mg | ORAL_TABLET | Freq: Two times a day (BID) | ORAL | Status: DC
  Administered 2019-05-29 – 2019-06-01 (×8): 25 mg via ORAL
  Filled 2019-05-28 (×8): qty 1

## 2019-05-28 MED ORDER — SODIUM CHLORIDE 0.9 % IV SOLN
1.0000 g | Freq: Three times a day (TID) | INTRAVENOUS | Status: DC
  Filled 2019-05-28 (×2): qty 1

## 2019-05-28 MED ORDER — VITAMIN B-12 1000 MCG PO TABS
1000.0000 ug | ORAL_TABLET | Freq: Every day | ORAL | Status: DC
  Administered 2019-05-28 – 2019-06-01 (×5): 1000 ug via ORAL
  Filled 2019-05-28 (×5): qty 1

## 2019-05-28 NOTE — ED Provider Notes (Signed)
[facility] DEPT Provider Note   CSN: [id number] Arrival date & time: 05/28/19  1039     History Chief Complaint  Patient presents with  . Failure To Thrive  . Fall    Chaden Doom Spray is a 86 y.o. male.  86 y.o male with a PMH of Anemia, Afib, DM, Pleural effusion presents to the ED via EMS for failure to thrive. Patient brought in from [facility] facility, for increasing confusion, fall on Saturday.  Does report right hip pain, currently on blood thinners, no head injury noted.  Level 5 caveat.  The history is provided by the patient.  Fall       Past Medical History:  Diagnosis Date  . Anemia    chronic  . Atrial fibrillation ([place])   . Cancer ([place])   . CHB (complete heart block) ([place])   . Coronary artery disease   . Diabetes mellitus   . Dyslipidemia   . H/O prostate cancer   . Hypertension   . New onset atrial flutter, persistent 07/04/2014  . Pacemaker 12/12/2006   Medtronic adapta  . Pleural effusion, left   . Pneumonia 09/2015  . Prostate cancer ([place])   . 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 Ventricular tachycardia (paroxysmal) ([place]) 03/28/2014    Patient Active Problem List   Diagnosis Date Noted  . Macrocytic anemia 04/09/2019  . CAP (community acquired pneumonia) 02/23/2019  . Parkinson's disease ([place]) 02/22/2019  . Acute encephalopathy 02/22/2019  . Symptomatic anemia 10/23/2018  . Pacemaker battery depletion 02/02/2017  . CHF (congestive heart failure) ([place]) 07/27/2016  . Weakness generalized 07/27/2016  . Generalized weakness 07/27/2016  . Long term current use of anticoagulant 01/16/2016  . Acute respiratory failure with hypoxia ([place]) 09/15/2015  . Acute on chronic diastolic congestive heart failure ([place]) 09/15/2015  . CKD (chronic kidney disease), stage III ([place]) 09/15/2015  . Atrial fibrillation ([place])   . Pleural effusion, left   . Acute  diastolic (congestive) heart failure ([place])   . Community acquired pneumonia 02/19/2015  . Atrial flutter ([place]) 01/20/2015  . New onset atrial flutter, persistent 07/04/2014  . Ventricular tachycardia (paroxysmal) ([place]) 03/28/2014  . Chronic diastolic congestive heart failure ([place]) 01/02/2014  . Pacemaker dependent - Medtronic 01/10/2013  . CAD s/p CABG 2003 01/10/2013  . Sinus arrest 01/10/2013  . CHB (complete heart block) ([place]) 01/10/2013  . DM2 (diabetes mellitus, type 2) ([place]) 01/10/2013  . Dyslipidemia 01/10/2013  . HTN (hypertension) 01/10/2013    Past Surgical History:  Procedure Laterality Date  . CORONARY ARTERY BYPASS GRAFT  09/04/2001   LIMA to LAD,SVG to diagonal,SVG to ramus intermedius,SVG to PDA  . NM MYOVIEW LTD  01/09/2010   no ischemia  . PACEMAKER INSERTION  12/12/2006   Medtronic adapta  . PPM GENERATOR CHANGEOUT N/A 02/02/2017   Procedure: PPM GENERATOR CHANGEOUT - DUAL CHAMBER;  Surgeon: [name], MD;  Location: [facility];  Service: Cardiovascular;  Laterality: N/A;  . PROSTATE SURGERY  2001   cancer  . TONSILLECTOMY         Family History  Problem Relation Age of Onset  . Heart disease Mother   . Heart attack Mother   . Other Father        sepsis  . Other Sister        brain tumor    Social History   Tobacco Use  .  Smoking status: Former Smoker    Types: Cigarettes, Cigars    Quit date: 05/14/1962    Years since quitting: 57.0  . Smokeless tobacco: Never Used  Substance Use Topics  . Alcohol use: Yes    Comment: seldom  . Drug use: No    Home Medications Prior to Admission medications   Medication Sig Start Date End Date Taking? Authorizing Provider  acetaminophen (TYLENOL) 650 MG CR tablet Take 650 mg by mouth every 8 (eight) hours as needed for pain.   Yes [provider]  atorvastatin (LIPITOR) 20 MG tablet Take 20 mg by mouth daily.   Yes [provider]  carbidopa-levodopa (SINEMET IR) 25-100 MG tablet  Take 1 tablet by mouth 3 (three) times daily. 01/03/19  Yes Penumalli, Earlean Polka, MD  Cholecalciferol (VITAMIN D PO) Take 10,000 Units by mouth every Friday. On Friday    Yes [provider]  ELIQUIS 2.5 MG TABS tablet TAKE 1 TABLET TWICE DAILY Patient taking differently: Take 2.5 mg by mouth 2 (two) times daily.  03/09/19  Yes Croitoru, Mihai, MD  furosemide (LASIX) 40 MG tablet Take 40 mg by mouth daily.   Yes [provider]  JANUVIA 50 MG tablet Take 50 mg by mouth every evening.  05/27/15  Yes [provider]  metoprolol tartrate (LOPRESSOR) 50 MG tablet TAKE 2 TABLETS IN THE MORNING  AND TAKE 1 TABLET IN THE EVENING Patient taking differently: Take 50-100 mg by mouth See admin instructions. 100mg  in am & 50mg  in evening. 03/09/19  Yes Croitoru, Mihai, MD  Multiple Vitamins-Minerals (PRESERVISION AREDS) CAPS Take 1 capsule by mouth 2 (two) times daily.   Yes [provider]  polyethylene glycol (MIRALAX / GLYCOLAX) packet Take 17 g by mouth daily. To prevent constipation    Yes [provider]  QUEtiapine (SEROQUEL) 25 MG tablet Take 0.5 tablets (12.5 mg total) by mouth at bedtime. 03/01/19 05/28/19 Yes Donne Hazel, MD  vitamin B-12 (CYANOCOBALAMIN) 1000 MCG tablet Take 1,000 mcg by mouth daily.   Yes [provider]  cefdinir (OMNICEF) 300 MG capsule Take 1 capsule (300 mg total) by mouth 2 (two) times daily. Patient not taking: Reported on 05/28/2019 04/14/19   Shelly Coss, MD  nitrofurantoin, macrocrystal-monohydrate, (MACROBID) 100 MG capsule Take 100 mg by mouth 2 (two) times daily. 7 day supply 05/16/19   [provider]  sulfamethoxazole-trimethoprim (BACTRIM DS) 800-160 MG tablet Take 1 tablet by mouth 2 (two) times daily. 7 day supply 05/18/19   [provider]    Allergies    Milk-related compounds, Lanoxin [digoxin], and Mexitil [mexiletine]  Review of Systems   Review of Systems  Unable to perform ROS:  Mental status change    Physical Exam Updated Vital Signs BP (!) 117/48   Pulse (!) 59   Temp 98.3 F (36.8 C) (Oral)   Resp 16   SpO2 100%   Physical Exam Vitals and nursing note reviewed.  HENT:     Head: Normocephalic and atraumatic.     Nose: Nose normal.     Mouth/Throat:     Mouth: Mucous membranes are dry.  Eyes:     Pupils: Pupils are equal, round, and reactive to light.  Cardiovascular:     Rate and Rhythm: Normal rate.  Pulmonary:     Effort: Pulmonary effort is normal.     Breath sounds: Examination of the right-lower field reveals decreased breath sounds. Examination of the left-lower field reveals decreased breath sounds.  Decreased breath sounds present. No wheezing or rhonchi.     Comments: Decrease breath sounds on lower lung fields.  Abdominal:     General: Abdomen is flat. There is no distension.     Palpations: Abdomen is soft.     Tenderness: There is no abdominal tenderness. There is no right CVA tenderness.  Musculoskeletal:     Cervical back: Normal range of motion and neck supple.     Right lower leg: No edema.     Left lower leg: No edema.     Comments: Hips without any external rotations or weakness noted on my exam.   Skin:    General: Skin is warm and dry.     Findings: No bruising or erythema.  Neurological:     Mental Status: Mental status is at baseline.     ED Results / Procedures / Treatments   Labs (all labs ordered are listed, but only abnormal results are displayed) Labs Reviewed  CBC WITH DIFFERENTIAL/PLATELET - Abnormal; Notable for the following components:      Result Value   WBC 11.4 (*)    RBC 2.05 (*)    Hemoglobin 7.0 (*)    HCT 23.3 (*)    MCV 113.7 (*)    MCH 34.1 (*)    RDW 17.6 (*)    Monocytes Absolute 3.4 (*)    Abs Immature Granulocytes 0.21 (*)    All other components within normal limits  COMPREHENSIVE METABOLIC PANEL - Abnormal; Notable for the following components:   Potassium 5.4 (*)    Glucose, Bld  188 (*)    BUN 59 (*)    Creatinine, Ser 2.97 (*)    Calcium 8.4 (*)    Albumin 3.1 (*)    GFR calc non Af Amer 17 (*)    GFR calc Af Amer 20 (*)    All other components within normal limits  PROTIME-INR - Abnormal; Notable for the following components:   Prothrombin Time 17.8 (*)    INR 1.5 (*)    All other components within normal limits  URINALYSIS, ROUTINE W REFLEX MICROSCOPIC - Abnormal; Notable for the following components:   Protein, ur 30 (*)    Bacteria, UA RARE (*)    All other components within normal limits  RESPIRATORY PANEL BY RT PCR (FLU A&B, COVID)    EKG EKG Interpretation  Date/Time:  Monday May 28 2019 11:58:04 EST Ventricular Rate:  60 PR Interval:    QRS Duration: 193 QT Interval:  500 QTC Calculation: 500 R Axis:   -79 Text Interpretation: Atrial fibrillation Nonspecific IVCD with LAD LVH with secondary repolarization abnormality No significant change since last tracing Confirmed by Lennice Sites [phone]) on 05/28/2019 12:11:02 PM   Radiology DG Chest 1 View  Result Date: 05/28/2019 CLINICAL DATA:  Recent falls with hip pain, initial encounter EXAM: CHEST  1 VIEW COMPARISON:  04/09/2019 FINDINGS: Cardiac shadow is enlarged but stable. Pacing device is again seen as well as postoperative change. The lungs are hypoinflated with patchy opacity throughout both lungs. No acute bony abnormality is seen. IMPRESSION: Patchy airspace opacities within both lungs increased when compared with the prior exam. This likely represents atypical/viral pneumonia. Electronically Signed   By: Inez Catalina M.D.   On: 05/28/2019 12:56   CT Head Wo Contrast  Result Date: 05/28/2019 CLINICAL DATA:  86 year old who fell twice last week, most recently 3 days ago, presenting with increasing lethargy and anorexia. Patient is currently anticoagulated with Eliquis due  to atrial fibrillation. Initial encounter. EXAM: CT HEAD WITHOUT CONTRAST CT CERVICAL SPINE WITHOUT CONTRAST  TECHNIQUE: Multidetector CT imaging of the head and cervical spine was performed following the standard protocol without intravenous contrast. Multiplanar CT image reconstructions of the cervical spine were also generated. COMPARISON:  CT head 07/26/2016 and earlier. CT cervical spine 10/13/2011. FINDINGS: CT HEAD FINDINGS Brain: Moderate to severe cortical atrophy, severe deep atrophy and mild to moderate cerebellar atrophy, unchanged. Moderate to severe changes of small vessel disease of the white matter diffusely, unchanged to somewhat progressive since 2018. Vascular: Severe BILATERAL carotid siphon and mild BILATERAL vertebral artery atherosclerosis. No hyperdense vessel. Skull: No skull fracture or other focal osseous abnormality involving the skull. Sinuses/Orbits: Visualized paranasal sinuses, bilateral mastoid air cells and bilateral middle ear cavities well-aerated. Visualized orbits and globes normal in appearance. Other: None. CT CERVICAL SPINE FINDINGS Alignment: Straightening of the usual cervical lordosis. Facet joints anatomically aligned with diffuse degenerative changes. Skull base and vertebrae: No fractures identified involving the cervical spine. 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 Coronal reformatted images demonstrate an intact craniocervical junction, intact dens and intact lateral masses throughout. Soft tissues and spinal canal: No evidence of paraspinous or spinal canal hematoma. Moderate spinal stenosis at C4-5 and at C6-7 and mild spinal stenosis at C5-6, all related to the POSTERIOR longitudinal ligament ossification. Disc levels: Severe degenerative changes at the C1-C2 articulation. Facet and uncinate hypertrophy account for multilevel foraminal stenoses including mild LEFT C2-3, moderate to severe LEFT C3-4, severe LEFT and mild RIGHT C4-5, mild LEFT C5-6, mild RIGHT C6-7. Upper chest: Approximate 3.0 x 2.3 cm nodule arising from the  LOWER pole of the RIGHT lobe of the thyroid gland, unchanged since the 2013 CT and therefore benign. Focal hyperlucency in the RIGHT lung apex likely indicating localized air trapping. Other: Mild-to-moderate BILATERAL cervical carotid atherosclerosis. IMPRESSION: 1. No acute intracranial abnormality. 2. Stable moderate to severe generalized atrophy. 3. Moderate to severe chronic microvascular ischemic changes of the white matter diffusely. 4. No cervical spine fractures identified. 5. Multilevel degenerative changes and DISH involving the cervical spine with spinal stenosis at C4-5, C5-6 and C6-7 and multilevel foraminal stenoses as detailed above. Electronically Signed   By: Evangeline Dakin M.D.   On: 05/28/2019 13:10   CT Cervical Spine Wo Contrast  Result Date: 05/28/2019 CLINICAL DATA:  86 year old who fell twice last week, most recently 3 days ago, presenting with increasing lethargy and anorexia. Patient is currently anticoagulated with Eliquis due to atrial fibrillation. Initial encounter. EXAM: CT HEAD WITHOUT CONTRAST CT CERVICAL SPINE WITHOUT CONTRAST TECHNIQUE: Multidetector CT imaging of the head and cervical spine was performed following the standard protocol without intravenous contrast. Multiplanar CT image reconstructions of the cervical spine were also generated. COMPARISON:  CT head 07/26/2016 and earlier. CT cervical spine 10/13/2011. FINDINGS: CT HEAD FINDINGS Brain: Moderate to severe cortical atrophy, severe deep atrophy and mild to moderate cerebellar atrophy, unchanged. Moderate to severe changes of small vessel disease of the white matter diffusely, unchanged to somewhat progressive since 2018. Vascular: Severe BILATERAL carotid siphon and mild BILATERAL vertebral artery atherosclerosis. No hyperdense vessel. Skull: No skull fracture or other focal osseous abnormality involving the skull. Sinuses/Orbits: Visualized paranasal sinuses, bilateral mastoid air cells and bilateral middle ear  cavities well-aerated. Visualized orbits and globes normal in appearance. Other: None. CT CERVICAL SPINE FINDINGS Alignment: Straightening of the usual cervical lordosis. Facet joints anatomically aligned with diffuse degenerative changes. Skull base and vertebrae: No  fractures identified involving the cervical spine. 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 Coronal reformatted images demonstrate an intact craniocervical junction, intact dens and intact lateral masses throughout. Soft tissues and spinal canal: No evidence of paraspinous or spinal canal hematoma. Moderate spinal stenosis at C4-5 and at C6-7 and mild spinal stenosis at C5-6, all related to the POSTERIOR longitudinal ligament ossification. Disc levels: Severe degenerative changes at the C1-C2 articulation. Facet and uncinate hypertrophy account for multilevel foraminal stenoses including mild LEFT C2-3, moderate to severe LEFT C3-4, severe LEFT and mild RIGHT C4-5, mild LEFT C5-6, mild RIGHT C6-7. Upper chest: Approximate 3.0 x 2.3 cm nodule arising from the LOWER pole of the RIGHT lobe of the thyroid gland, unchanged since the 2013 CT and therefore benign. Focal hyperlucency in the RIGHT lung apex likely indicating localized air trapping. Other: Mild-to-moderate BILATERAL cervical carotid atherosclerosis. IMPRESSION: 1. No acute intracranial abnormality. 2. Stable moderate to severe generalized atrophy. 3. Moderate to severe chronic microvascular ischemic changes of the white matter diffusely. 4. No cervical spine fractures identified. 5. Multilevel degenerative changes and DISH involving the cervical spine with spinal stenosis at C4-5, C5-6 and C6-7 and multilevel foraminal stenoses as detailed above. Electronically Signed   By: Evangeline Dakin M.D.   On: 05/28/2019 13:10   DG HIP UNILAT WITH PELVIS 2-3 VIEWS RIGHT  Result Date: 05/28/2019 CLINICAL DATA:  Recent falls with right hip pain, initial  encounter EXAM: DG HIP (WITH OR WITHOUT PELVIS) 2-3V RIGHT COMPARISON:  05/23/2019 FINDINGS: Pelvic ring is intact. Degenerative changes of the lumbar spine are seen. No acute fracture or dislocation is noted. Degenerative changes of the hip joints are noted. Prostate brachytherapy seeds are seen. IMPRESSION: No acute abnormality noted. Electronically Signed   By: Inez Catalina M.D.   On: 05/28/2019 12:54    Procedures Procedures (including critical care time)  Medications Ordered in ED Medications  ceFEPIme (MAXIPIME) 1 g in sodium chloride 0.9 % 100 mL IVPB (1 g Intravenous New Bag/Given 05/28/19 1404)  vancomycin (VANCOCIN) IVPB 1000 mg/200 mL premix (has no administration in time range)    ED Course  I have reviewed the triage vital signs and the nursing notes.  Pertinent labs & imaging results that were available during my care of the patient were reviewed by me and considered in my medical decision making (see chart for details).    MDM Rules/Calculators/A&P   Patient with a past medical history of hypertension, diabetes, A. fib on Eliquis, CHF presents to the ED via EMS for allergy to thrive.  Patient had a couple falls in the last couple of days, reports right hip pain, during my exam hips do not appear externally rotated.  Patient does not voice many complaints, he does have a prior history of Alzheimer's, but seems to be worse of his baseline.  CBC was remarkable for a slight leukocytosis at 11.4, hemoglobin is at his baseline.  CMP with some slight increase in potassium, creatinine level is elevated, this is an acute on chronic of his kidney function.  No anion gap.  UA with rare bacteria, no nitrates, leukocytes.  PT and INR are elevated.  CT of his head along with C-spine showed no intracranial pathology.  Hip x-ray of the right did not show any acute fracture or dislocation.  Chest x-ray with Patchy airspace opacities within both lungs increased when compared  with the prior  exam. This likely represents atypical/viral  pneumonia.     Vancomycin and  cefepime antibiotics were started for patient in order to treat his likely hospital-acquired pneumonia as he currently is in a facility.  He was discharged from the hospital on December 4 for similar complaints, is currently now on 2 L of oxygen, this has not increased or worsened since his last admission.  2:18 PM Spoke to hospitalist service, appreciate their help.  Patient is currently awaiting Covid test, will place as a PUI.  Portions of this note were generated with [employer]obby[employer]. Dictation errors may occur despite best attempts at proofreading.  Final Clinical Impression(s) / ED Diagnoses Final diagnoses:  AKI (acute kidney injury) ([place])  HAP (hospital-acquired pneumonia)  Fall, initial encounter  Failure to thrive (0-17)    Rx / DC Orders ED Discharge Orders    None       Janeece Fitting, PA-C 05/28/19 [place], [place], DO 05/28/19 1447

## 2019-05-28 NOTE — ED Triage Notes (Signed)
Arrives via EMS from [facility], had two falls last week, complaining of R hip pain since the fall (last fall on Friday), has not been evaluated since then. Since Saturday FTT, increased lethargy and decreased appetite. Patient is on blood thinner, denies LOC, no obvious injuries from fall.

## 2019-05-28 NOTE — H&P (Signed)
History and Physical        Hospital Admission Note Date: 05/28/2019  Patient name: [name] Powell Medical record number: [id number] Date of birth: 12/28/1922 Age: 86 y.o. Gender: male  PCP: [name], MD    Patient coming from: [facility] independent living facility  I have reviewed all records in the [facility].    Chief Complaint:  Falls, increasing lethargy, right hip pain  HPI: Patient is a 86 year old male with history of atrial fibrillation on anticoagulation, hypertension, history of process TIA, diabetes mellitus type 2, NIDDM, dyslipidemia, CAD, dementia presented from [facility] independent living facility.  Patient is somewhat lethargic and appears to be in pain, difficult to obtain history from the patient.  Patient apparently had 2 falls since last week last fall on Friday 3 days ago.  He had not been evaluated for the falls.  For the last 2 days, patient has been having increasing lethargy, not eating too well.  On the examination, patient complained of right hip pain.  No reports of fevers and chills, coughing, shortness of breath. Patient is noted to have O2 sats 100% on 2 L  COVID-19 test pending  ED work-up/course:  Temp 98.3, respiratory rate 17, heart rate 65, BP 133/57, O2 sats 90% on 2 L Sodium 138, potassium 5.4, glucose 188, BUN 59, creatinine 2.97, baseline creatinine 1.8-1.9 WBCs 11.4, hemoglobin 7.0, hematocrit 23.3, MCV 113.7  CT head showed no acute intracranial abnormalities, moderate to severe chronic microvascular ischemic changes of the white matter diffusely.  Multilevel degenerative changes and DISH involving cervical spine.  Chest x-ray showed patchy airspace opacities within both lungs increased when compared with prior exam, likely represents atypical/viral pneumonia  Review of Systems: Positives marked in  'bold' Unable to obtain from the patient due to dementia   Past Medical History: Past Medical History:  Diagnosis Date  . Anemia    chronic  . Atrial fibrillation ([place])   . Cancer ([place])   . CHB (complete heart block) ([place])   . Coronary artery disease   . Diabetes mellitus   . Dyslipidemia   . H/O prostate cancer   . Hypertension   . New onset atrial flutter, persistent 07/04/2014  . Pacemaker 12/12/2006   Medtronic adapta  . Pleural effusion, left   . Pneumonia 09/2015  . Prostate cancer ([place])   . 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 Ventricular tachycardia (paroxysmal) ([place]) 03/28/2014    Past Surgical History:  Procedure Laterality Date  . CORONARY ARTERY BYPASS GRAFT  09/04/2001   LIMA to LAD,SVG to diagonal,SVG to ramus intermedius,SVG to PDA  . NM MYOVIEW LTD  01/09/2010   no ischemia  . PACEMAKER INSERTION  12/12/2006   Medtronic adapta  . PPM GENERATOR CHANGEOUT N/A 02/02/2017   Procedure: PPM GENERATOR CHANGEOUT - DUAL CHAMBER;  Surgeon: [name], MD;  Location: [facility];  Service: Cardiovascular;  Laterality: N/A;  . PROSTATE SURGERY  2001   cancer  . TONSILLECTOMY      Medications: Prior to Admission medications   Medication Sig Start Date End Date Taking?  Authorizing Provider  acetaminophen (TYLENOL) 650 MG CR tablet Take 650 mg by mouth every 8 (eight) hours as needed for pain.   Yes [provider]  atorvastatin (LIPITOR) 20 MG tablet Take 20 mg by mouth daily.   Yes [provider]  carbidopa-levodopa (SINEMET IR) 25-100 MG tablet Take 1 tablet by mouth 3 (three) times daily. 01/03/19  Yes Penumalli, Earlean Polka, MD  Cholecalciferol (VITAMIN D PO) Take 10,000 Units by mouth every Friday. On Friday    Yes [provider]  ELIQUIS 2.5 MG TABS tablet TAKE 1 TABLET TWICE DAILY Patient taking differently: Take 2.5 mg by mouth 2 (two) times daily.  03/09/19  Yes Croitoru,  Mihai, MD  furosemide (LASIX) 40 MG tablet Take 40 mg by mouth daily.   Yes [provider]  JANUVIA 50 MG tablet Take 50 mg by mouth every evening.  05/27/15  Yes [provider]  metoprolol tartrate (LOPRESSOR) 50 MG tablet TAKE 2 TABLETS IN THE MORNING  AND TAKE 1 TABLET IN THE EVENING Patient taking differently: Take 50-100 mg by mouth See admin instructions. 100mg  in am & 50mg  in evening. 03/09/19  Yes Croitoru, Mihai, MD  Multiple Vitamins-Minerals (PRESERVISION AREDS) CAPS Take 1 capsule by mouth 2 (two) times daily.   Yes [provider]  polyethylene glycol (MIRALAX / GLYCOLAX) packet Take 17 g by mouth daily. To prevent constipation    Yes [provider]  QUEtiapine (SEROQUEL) 25 MG tablet Take 0.5 tablets (12.5 mg total) by mouth at bedtime. 03/01/19 05/28/19 Yes Donne Hazel, MD  vitamin B-12 (CYANOCOBALAMIN) 1000 MCG tablet Take 1,000 mcg by mouth daily.   Yes [provider]  cefdinir (OMNICEF) 300 MG capsule Take 1 capsule (300 mg total) by mouth 2 (two) times daily. Patient not taking: Reported on 05/28/2019 04/14/19   Shelly Coss, MD  nitrofurantoin, macrocrystal-monohydrate, (MACROBID) 100 MG capsule Take 100 mg by mouth 2 (two) times daily. 7 day supply 05/16/19   [provider]  sulfamethoxazole-trimethoprim (BACTRIM DS) 800-160 MG tablet Take 1 tablet by mouth 2 (two) times daily. 7 day supply 05/18/19   [provider]    Allergies:   Allergies  Allergen Reactions  . Milk-Related Compounds Cough  . Lanoxin [Digoxin] Rash    Eyelid rash  . Mexitil [Mexiletine] Other (See Comments)    unknown    Social History:  reports that he quit smoking about 57 years ago. His smoking use included cigarettes and cigars. He has never used smokeless tobacco. He reports current alcohol use. He reports that he does not use drugs.  Family History: Family History  Problem Relation Age of Onset  . Heart disease Mother    . Heart attack Mother   . Other Father        sepsis  . Other Sister        brain tumor    Physical Exam: Blood pressure (!) 117/48, pulse (!) 59, temperature 98.3 F (36.8 C), temperature source Oral, resp. rate 16, SpO2 100 %. General: Somewhat lethargic, arousable, oriented x to self and place, poor historian Eyes: pink conjunctiva,anicteric sclera, pupils equal and reactive to light and accomodation, HEENT: normocephalic, atraumatic, oropharynx clear Neck: supple, no masses or lymphadenopathy, no goiter, no bruits, no JVD CVS: Regular rate and rhythm, without murmurs, rubs or gallops. No lower extremity edema Resp : Decreased breath sound at the bases GI : Soft, nontender, nondistended, positive bowel sounds, no masses. No hepatomegaly. No hernia.  Musculoskeletal:  No clubbing or cyanosis, positive pedal pulses. No contracture. ROM intact  Neuro: Grossly intact, no focal neurological deficits, strength 5/5 upper extremities.  Left lower extremity 5/5, right lower extremity appears to be in pain on lifting up the leg.  Psych: Somewhat lethargic Skin: no rashes or lesions, warm and dry   LABS on Admission: I have personally reviewed all the labs and imagings below    Basic Metabolic Panel: Recent Labs  Lab 05/28/19 1111  NA 138  K 5.4*  CL 104  CO2 25  GLUCOSE 188*  BUN 59*  CREATININE 2.97*  CALCIUM 8.4*   Liver Function Tests: Recent Labs  Lab 05/28/19 1111  AST 15  ALT 7  ALKPHOS 107  BILITOT 0.5  PROT 7.0  ALBUMIN 3.1*   No results for input(s): LIPASE, AMYLASE in the last 168 hours. No results for input(s): AMMONIA in the last 168 hours. CBC: Recent Labs  Lab 05/28/19 1111  WBC 11.4*  NEUTROABS 6.6  HGB 7.0*  HCT 23.3*  MCV 113.7*  PLT 193   Cardiac Enzymes: No results for input(s): CKTOTAL, CKMB, CKMBINDEX, TROPONINI in the last 168 hours. BNP: Invalid input(s): POCBNP CBG: No results for input(s): GLUCAP in the last 168  hours.  Radiological Exams on Admission:  DG Chest 1 View  Result Date: 05/28/2019 CLINICAL DATA:  Recent falls with hip pain, initial encounter EXAM: CHEST  1 VIEW COMPARISON:  04/09/2019 FINDINGS: Cardiac shadow is enlarged but stable. Pacing device is again seen as well as postoperative change. The lungs are hypoinflated with patchy opacity throughout both lungs. No acute bony abnormality is seen. IMPRESSION: Patchy airspace opacities within both lungs increased when compared with the prior exam. This likely represents atypical/viral pneumonia. Electronically Signed   By: Inez Catalina M.D.   On: 05/28/2019 12:56   CT Head Wo Contrast  Result Date: 05/28/2019 CLINICAL DATA:  86 year old who fell twice last week, most recently 3 days ago, presenting with increasing lethargy and anorexia. Patient is currently anticoagulated with Eliquis due to atrial fibrillation. Initial encounter. EXAM: CT HEAD WITHOUT CONTRAST CT CERVICAL SPINE WITHOUT CONTRAST TECHNIQUE: Multidetector CT imaging of the head and cervical spine was performed following the standard protocol without intravenous contrast. Multiplanar CT image reconstructions of the cervical spine were also generated. COMPARISON:  CT head 07/26/2016 and earlier. CT cervical spine 10/13/2011. FINDINGS: CT HEAD FINDINGS Brain: Moderate to severe cortical atrophy, severe deep atrophy and mild to moderate cerebellar atrophy, unchanged. Moderate to severe changes of small vessel disease of the white matter diffusely, unchanged to somewhat progressive since 2018. Vascular: Severe BILATERAL carotid siphon and mild BILATERAL vertebral artery atherosclerosis. No hyperdense vessel. Skull: No skull fracture or other focal osseous abnormality involving the skull. Sinuses/Orbits: Visualized paranasal sinuses, bilateral mastoid air cells and bilateral middle ear cavities well-aerated. Visualized orbits and globes normal in appearance. Other: None. CT CERVICAL SPINE  FINDINGS Alignment: Straightening of the usual cervical lordosis. Facet joints anatomically aligned with diffuse degenerative changes. Skull base and vertebrae: No fractures identified involving the cervical spine. 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 Coronal reformatted images demonstrate an intact craniocervical junction, intact dens and intact lateral masses throughout. Soft tissues and spinal canal: No evidence of paraspinous or spinal canal hematoma. Moderate spinal stenosis at C4-5 and at C6-7 and mild spinal stenosis at C5-6, all related to the POSTERIOR longitudinal ligament ossification. Disc levels: Severe degenerative changes at the C1-C2 articulation. Facet and uncinate hypertrophy account for multilevel  foraminal stenoses including mild LEFT C2-3, moderate to severe LEFT C3-4, severe LEFT and mild RIGHT C4-5, mild LEFT C5-6, mild RIGHT C6-7. Upper chest: Approximate 3.0 x 2.3 cm nodule arising from the LOWER pole of the RIGHT lobe of the thyroid gland, unchanged since the 2013 CT and therefore benign. Focal hyperlucency in the RIGHT lung apex likely indicating localized air trapping. Other: Mild-to-moderate BILATERAL cervical carotid atherosclerosis. IMPRESSION: 1. No acute intracranial abnormality. 2. Stable moderate to severe generalized atrophy. 3. Moderate to severe chronic microvascular ischemic changes of the white matter diffusely. 4. No cervical spine fractures identified. 5. Multilevel degenerative changes and DISH involving the cervical spine with spinal stenosis at C4-5, C5-6 and C6-7 and multilevel foraminal stenoses as detailed above. Electronically Signed   By: Evangeline Dakin M.D.   On: 05/28/2019 13:10   CT Cervical Spine Wo Contrast  Result Date: 05/28/2019 CLINICAL DATA:  86 year old who fell twice last week, most recently 3 days ago, presenting with increasing lethargy and anorexia. Patient is currently anticoagulated with Eliquis  due to atrial fibrillation. Initial encounter. EXAM: CT HEAD WITHOUT CONTRAST CT CERVICAL SPINE WITHOUT CONTRAST TECHNIQUE: Multidetector CT imaging of the head and cervical spine was performed following the standard protocol without intravenous contrast. Multiplanar CT image reconstructions of the cervical spine were also generated. COMPARISON:  CT head 07/26/2016 and earlier. CT cervical spine 10/13/2011. FINDINGS: CT HEAD FINDINGS Brain: Moderate to severe cortical atrophy, severe deep atrophy and mild to moderate cerebellar atrophy, unchanged. Moderate to severe changes of small vessel disease of the white matter diffusely, unchanged to somewhat progressive since 2018. Vascular: Severe BILATERAL carotid siphon and mild BILATERAL vertebral artery atherosclerosis. No hyperdense vessel. Skull: No skull fracture or other focal osseous abnormality involving the skull. Sinuses/Orbits: Visualized paranasal sinuses, bilateral mastoid air cells and bilateral middle ear cavities well-aerated. Visualized orbits and globes normal in appearance. Other: None. CT CERVICAL SPINE FINDINGS Alignment: Straightening of the usual cervical lordosis. Facet joints anatomically aligned with diffuse degenerative changes. Skull base and vertebrae: No fractures identified involving the cervical spine. 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 Coronal reformatted images demonstrate an intact craniocervical junction, intact dens and intact lateral masses throughout. Soft tissues and spinal canal: No evidence of paraspinous or spinal canal hematoma. Moderate spinal stenosis at C4-5 and at C6-7 and mild spinal stenosis at C5-6, all related to the POSTERIOR longitudinal ligament ossification. Disc levels: Severe degenerative changes at the C1-C2 articulation. Facet and uncinate hypertrophy account for multilevel foraminal stenoses including mild LEFT C2-3, moderate to severe LEFT C3-4, severe LEFT and  mild RIGHT C4-5, mild LEFT C5-6, mild RIGHT C6-7. Upper chest: Approximate 3.0 x 2.3 cm nodule arising from the LOWER pole of the RIGHT lobe of the thyroid gland, unchanged since the 2013 CT and therefore benign. Focal hyperlucency in the RIGHT lung apex likely indicating localized air trapping. Other: Mild-to-moderate BILATERAL cervical carotid atherosclerosis. IMPRESSION: 1. No acute intracranial abnormality. 2. Stable moderate to severe generalized atrophy. 3. Moderate to severe chronic microvascular ischemic changes of the white matter diffusely. 4. No cervical spine fractures identified. 5. Multilevel degenerative changes and DISH involving the cervical spine with spinal stenosis at C4-5, C5-6 and C6-7 and multilevel foraminal stenoses as detailed above. Electronically Signed   By: Evangeline Dakin M.D.   On: 05/28/2019 13:10   DG HIP UNILAT WITH PELVIS 2-3 VIEWS RIGHT  Result Date: 05/28/2019 CLINICAL DATA:  Recent falls with right hip pain, initial encounter EXAM: DG HIP (  WITH OR WITHOUT PELVIS) 2-3V RIGHT COMPARISON:  05/23/2019 FINDINGS: Pelvic ring is intact. Degenerative changes of the lumbar spine are seen. No acute fracture or dislocation is noted. Degenerative changes of the hip joints are noted. Prostate brachytherapy seeds are seen. IMPRESSION: No acute abnormality noted. Electronically Signed   By: Inez Catalina M.D.   On: 05/28/2019 12:54      EKG: Independently reviewed.  Rate 60, A. fib   Assessment/Plan Principal Problem:   Acute respiratory failure with hypoxia (HCC) with HCAP, PUI -Chest x-ray shows patchy airspace opacities in both lungs concerning for atypical pneumonia -Ongoing COVID-19 pandemic, patient is currently in independent living facility, will admit as PUI, obtain COVID-19 test, -Continue airborne and contact precautions until COVID-19 test results back -Obtain procalcitonin, lactic acid, CRP, ferritin, D-dimer -Placed on IV vancomycin and aztreonam, follow  urine Legionella antigen, urine strep antigen, SLP evaluation  Active Problems: Multiple falls with right hip pain -Right hip x-ray negative for fracture or dislocation however patient has tenderness and pain on lifting his right leg -Obtain CT of the right hip to rule out occult fracture -PT evaluation, will likely need higher level of care, currently in independent living facility  Acute kidney injury on CKD stage III -Baseline creatinine 1.8-1.9, presenting with creatinine of 2.9, poor oral intake in the last 2 to 3 days -Hold Lasix, placed on gentle hydration    CAD s/p CABG 2003,  Pacemaker dependent - Medtronic -Currently no acute chest pain, continue Lipitor, Eliquis    DM2 (diabetes mellitus, type 2) ([place]) -Hold Januvia, placed on sliding scale insulin while inpatient -Follow hemoglobin A1c    HTN (hypertension) -Hold Lasix, continue metoprolol due to history of A. fib    Chronic diastolic congestive heart failure (HCC) -Currently appears to be somewhat dehydrated, lethargic, acute kidney injury due to poor oral intake -Placed on gentle hydration, recheck bmet -Hold Lasix for now    Atrial fibrillation ([place]), permanent -Restart beta-blocker, at the lower dose 25 mg twice daily due to soft BP -Continue Eliquis 2.5 mg twice daily  Macrocytic anemia -Obtain anemia panel, FOBT -Hemoglobin 7.0, no report of active bleeding -Baseline 8-9, has been trending down since December 2020 -Transfuse 1 unit packed RBCs    Weakness generalized, falls, failure to thrive -PT OT evaluation, likely needs higher level of care  DVT prophylaxis: Eliquis  CODE STATUS: Full CODE STATUS  Consults called: None  Admission status: Inpatient, telemetry, PUI  The medical decision making on this patient was of high complexity and the patient is at high risk for clinical deterioration, therefore this is a level 3 admission.  Severity of Illness:      The appropriate patient status for this  patient is INPATIENT. Inpatient status is judged to be reasonable and necessary in order to provide the required intensity of service to ensure the patient's safety. The patient's presenting symptoms, physical exam findings, and initial radiographic and laboratory data in the context of their chronic comorbidities is felt to place them at high risk for further clinical deterioration. Furthermore, it is not anticipated that the patient will be medically stable for discharge from the hospital within 2 midnights of admission. The following factors support the patient status of inpatient.   " The patient's presenting symptoms include multiple falls, failure to thrive, generalized debility, shortness of breath, bilateral pneumonia " The worrisome physical exam findings include lethargic " The initial radiographic and laboratory data are worrisome because of anemia leukocytosis, chest x-ray with pneumonia "  The chronic co-morbidities include dementia, CAD, hypertension, chronic kidney disease   * I certify that at the point of admission it is my clinical judgment that the patient will require inpatient hospital care spanning beyond 2 midnights from the point of admission due to high intensity of service, high risk for further deterioration and high frequency of surveillance required.*    Time Spent on Admission: 70 minutes     Armanii Pressnell M.D. [facility] 05/28/2019, 2:39 PM

## 2019-05-28 NOTE — ED Notes (Signed)
Called received from pt wife Sulayman Manning [phone] requesting update on pt status when possible. [employer]

## 2019-05-28 NOTE — Progress Notes (Signed)
A consult was received from an ED physician for vanc per pharmacy dosing.  The patient's profile has been reviewed for ht/wt/allergies/indication/available labs.   A one time order has been placed for vanc 1g.  Further antibiotics/pharmacy consults should be ordered by admitting physician if indicated.                       Thank you, Kara Mead 05/28/2019  1:40 PM

## 2019-05-28 NOTE — Progress Notes (Signed)
Pharmacy Antibiotic Note  [name] Powell is a 86 y.o. male admitted on 05/28/2019 with fall, right hip pain, increasing lethargy.  Pharmacy has been consulted for vancomycin and aztreonam dosing.  Pt has PMH significant for afib, T2DM, dementia. Broad spectrum antibiotics being started for PNA.   Today, 05/28/19 -WBC 11.4 -PCT < 0.10 -Lactate 2.5 -SCr 3, CrCl ~16 mL/min  Vancomycin 1000 mg + cefepime 2 g LD given in ED.   Plan:  Vancomycin 750 mg IV q48h  Goal vancomycin AUC 400-550, check vancomycin levels once at steady state if indicated  Follow renal function closely. Given elevated SCr and patients age, pt is at high risk for nephrotoxicity. Follow cultures for ability to de-escalate ABX.   Recommend checking MRSA PCR and d/c vancomycin if negative and respiratory is the only suspected source of infection  Aztreonam 1 g IV q8h. Pt has no documented PCN allergy on profile and received a dose of cefepime in ED, confirmed with RN that patient tolerated. Pt has received cephalosporins in the past, recommend changing aztreonam to cefepime.      Temp (24hrs), Avg:98.3 F (36.8 C), Min:98.3 F (36.8 C), Max:98.3 F (36.8 C)  Recent Labs  Lab 05/28/19 1111 05/28/19 1538  WBC 11.4*  --   CREATININE 2.97*  --   LATICACIDVEN  --  2.5*    CrCl cannot be calculated (Unknown ideal weight.).    Allergies  Allergen Reactions  . Milk-Related Compounds Cough  . Lanoxin [Digoxin] Rash    Eyelid rash  . Mexitil [Mexiletine] Other (See Comments)    unknown    Antimicrobials this admission: cefepime 1/18 >>  vancomycin 1/18 >>   Dose adjustments this admission:  Microbiology results: 1/18 UCx: Sent  1/18 Influenza: Neg 1/18 COVID: Neg  Thank you for allowing pharmacy to be a part of this patient's care.  [name], PharmD 05/28/2019 5:04 PM

## 2019-05-28 NOTE — ED Notes (Signed)
Date and time results received: 05/28/19 4:58 PM  Test: lactic acid Critical Value: 2.5  Name of Provider Notified: hosptialist paged  Orders Received? Or Actions Taken?: awaking call back

## 2019-05-29 DIAGNOSIS — I480 Paroxysmal atrial fibrillation: Secondary | ICD-10-CM

## 2019-05-29 LAB — CBC
HCT: 26.1 % — ABNORMAL LOW (ref 39.0–52.0)
Hemoglobin: 8.1 g/dL — ABNORMAL LOW (ref 13.0–17.0)
MCH: 33.1 pg (ref 26.0–34.0)
MCHC: 31 g/dL (ref 30.0–36.0)
MCV: 106.5 fL — ABNORMAL HIGH (ref 80.0–100.0)
Platelets: 193 10*3/uL (ref 150–400)
RBC: 2.45 MIL/uL — ABNORMAL LOW (ref 4.22–5.81)
RDW: 20.9 % — ABNORMAL HIGH (ref 11.5–15.5)
WBC: 13.4 10*3/uL — ABNORMAL HIGH (ref 4.0–10.5)
nRBC: 0 % (ref 0.0–0.2)

## 2019-05-29 LAB — BASIC METABOLIC PANEL
Anion gap: 10 (ref 5–15)
BUN: 56 mg/dL — ABNORMAL HIGH (ref 8–23)
CO2: 24 mmol/L (ref 22–32)
Calcium: 8.8 mg/dL — ABNORMAL LOW (ref 8.9–10.3)
Chloride: 105 mmol/L (ref 98–111)
Creatinine, Ser: 2.55 mg/dL — ABNORMAL HIGH (ref 0.61–1.24)
GFR calc Af Amer: 24 mL/min — ABNORMAL LOW (ref >60)
GFR calc non Af Amer: 21 mL/min — ABNORMAL LOW (ref >60)
Glucose, Bld: 197 mg/dL — ABNORMAL HIGH (ref 70–99)
Potassium: 5 mmol/L (ref 3.5–5.1)
Sodium: 139 mmol/L (ref 135–145)

## 2019-05-29 LAB — GLUCOSE, CAPILLARY: Glucose-Capillary: 171 mg/dL — ABNORMAL HIGH (ref 70–99)

## 2019-05-29 LAB — URINE CULTURE: Culture: <10000 — AB

## 2019-05-29 LAB — SARS CORONAVIRUS 2 (TAT 6-24 HRS): SARS Coronavirus 2: NEGATIVE

## 2019-05-29 LAB — CBG MONITORING, ED: Glucose-Capillary: 142 mg/dL — ABNORMAL HIGH (ref 70–99)

## 2019-05-29 LAB — HEMOGLOBIN A1C
Hgb A1c MFr Bld: 6.6 % — ABNORMAL HIGH (ref 4.8–5.6)
Mean Plasma Glucose: 142.72 mg/dL

## 2019-05-29 MED ORDER — SODIUM CHLORIDE 0.9 % IV SOLN: INTRAVENOUS | Status: AC

## 2019-05-29 MED ORDER — INSULIN ASPART 100 UNIT/ML ~~LOC~~ SOLN
0.0000 [IU] | Freq: Every day | SUBCUTANEOUS | Status: DC
  Filled 2019-05-29: qty 0.05

## 2019-05-29 MED ORDER — INSULIN ASPART 100 UNIT/ML ~~LOC~~ SOLN
0.0000 [IU] | Freq: Three times a day (TID) | SUBCUTANEOUS | Status: DC
  Administered 2019-05-29 – 2019-05-31 (×5): 1 [IU] via SUBCUTANEOUS
  Administered 2019-05-31 – 2019-06-01 (×3): 2 [IU] via SUBCUTANEOUS
  Filled 2019-05-29: qty 0.09

## 2019-05-29 NOTE — ED Notes (Addendum)
[phone](TOIZ) wife  [phone])

## 2019-05-29 NOTE — Progress Notes (Signed)
PT Cancellation Note  Patient Details Name: [name] Powell MRN: [id number] DOB: Nov 15, 1922   Cancelled Treatment:    Reason Eval/Treat Not Completed: Other (comment) Pt too lethargic to participate.  Would squeeze therapist fingers but not open eyes or respond further.  Will f/u as able.  Maggie Font, PT Acute Rehab Services Pager [phone] [facility] [phone]   [name] 05/29/2019, 12:51 PM

## 2019-05-29 NOTE — Progress Notes (Signed)
SLP Cancellation Note  Patient Details Name: [name] Powell MRN: [id number] DOB: 04/27/1923   Cancelled treatment:       Reason Eval/Treat Not Completed: Fatigue/lethargy limiting ability to participate(pt has been lethargic during admit, please call if pt awakens before 1600 otherwise will follow up next date.)   Kathleen Lime, MS [place] Office [phone]    Macario Golds 05/29/2019, 2:55 PM

## 2019-05-29 NOTE — Progress Notes (Signed)
Pt received from ED condition stable. VSS. Lethargic but arousable . Sacrum and hip(right ) with hematoma from fall prior to arrival.. Tele pacing . 02 @2L 

## 2019-05-29 NOTE — Progress Notes (Signed)
[facility]                                                                              Patient Demographics  [name] Powell, is a 86 y.o. male, DOB - [birth year], DEY:[id number]  Admit date - 05/28/2019   Admitting Physician Manning Luna Krystal Eaton, MD  Outpatient Primary MD for the patient is [name], MD  Outpatient specialists:   LOS - 1  days   Medical records reviewed and are as summarized below:    Chief Complaint  Patient presents with  . Failure To Thrive  . Fall       Brief summary   Patient is a 86 year old male with history of atrial fibrillation on anticoagulation, hypertension, history of process TIA, diabetes mellitus type 2, NIDDM, dyslipidemia, CAD, dementia presented from [facility].  Patient is somewhat lethargic and appears to be in pain, difficult to obtain history from the patient.  Patient apparently had 2 falls since last week last fall on Friday 3 days ago.  He had not been evaluated for the falls.  For the last 2 days, patient has been having increasing lethargy, not eating too well.  On the examination, patient complained of right hip pain.  No reports of fevers and chills, coughing, shortness of breath. Patient is noted to have O2 sats 100% on 2 L  COVID-19 test negative   Assessment & Plan    Principal Problem:   Acute respiratory failure with hypoxia (HCC) secondary to HCAP - Chest x-ray shows patchy airspace opacities in both lungs concerning for atypical pneumonia - COVID-19 test negative, influenza panel negative - Follow SLP evaluation, for now placed on dysphagia 3 diet - Continue IV vancomycin, cefepime  Active Problems: Multiple falls with right hip pain, hematoma -Right hip x-ray negative for fracture or dislocation however patient has tenderness and pain on lifting his right leg -CT of the right hip showed no fracture but showed large hematoma 11x 5x 2 cm in the subcutaneous fat  lateral to the right greater trochanter. -Hold apixaban, likely not a good candidate for anticoagulation now with falls and hematoma  Acute kidney injury on CKD stage III -Baseline creatinine 1.8-1.9, presented with creatinine of 2.9, poor oral intake in the last 2 to 3 days -Continue to hold Lasix, on gentle hydration, creatinine improving 2.5 today    CAD s/p CABG 2003,  Pacemaker dependent - [employer] -Currently no acute chest pain, continue Lipitor -Hold Eliquis    DM2 (diabetes mellitus, type 2) (HCC) -Hold Januvia, continue sliding scale insulin while inpatient -Follow hemoglobin A1c    HTN (hypertension) -Hold Lasix -BP currently stable, continue metoprolol due to history of A. fib    Chronic diastolic congestive heart failure (HCC) -Acute kidney injury with dehydration, poor oral intake in last few days PTA -Hold Lasix for now    Atrial fibrillation ([place]), permanent -Continue beta-blocker, at the lower dose 25 mg twice daily due to soft BP -Eliquis placed on hold secondary to large hematoma right hip  Acute on chronic macrocytic anemia -Anemia worsened likely due to the large hematoma -Baseline 8-9, has been trending  down since December 2020 -Transfuse 1 unit packed RBC on 1/18, hemoglobin 8.1 now at baseline    Weakness generalized, falls, failure to thrive -PT OT evaluation, likely needs higher level of care   Code Status: Full CODE STATUS DVT Prophylaxis: SCDs Family Communication: Discussed all imaging results, lab results, explained to the patient's wife on phone    Disposition Plan: Will need higher level of care, await PT evaluation. Ill-appearing, lethargic, remains inpatient  Time Spent in minutes  35 mins   Procedures:  CT right hip   Consultants:   None   Antimicrobials:   Anti-infectives (From admission, onward)   Start     Dose/Rate Route Frequency Ordered Stop   05/30/19 1400  vancomycin (VANCOREADY) IVPB 750 mg/150 mL     750  mg 150 mL/hr over 60 Minutes Intravenous Every 48 hours 05/28/19 1729     05/29/19 0600  ceFEPIme (MAXIPIME) 2 g in sodium chloride 0.9 % 100 mL IVPB     2 g 200 mL/hr over 30 Minutes Intravenous Every 24 hours 05/28/19 1845     05/28/19 2200  aztreonam (AZACTAM) 1 g in sodium chloride 0.9 % 100 mL IVPB  Status:  Discontinued     1 g 200 mL/hr over 30 Minutes Intravenous Every 8 hours 05/28/19 1728 05/28/19 1844   05/28/19 1345  ceFEPIme (MAXIPIME) 1 g in sodium chloride 0.9 % 100 mL IVPB     1 g 200 mL/hr over 30 Minutes Intravenous  Once 05/28/19 1333 05/28/19 1437   05/28/19 1345  vancomycin (VANCOCIN) IVPB 1000 mg/200 mL premix     1,000 mg 200 mL/hr over 60 Minutes Intravenous  Once 05/28/19 1341 05/28/19 1753          Medications  Scheduled Meds: . apixaban  2.5 mg Oral BID  . atorvastatin  20 mg Oral Daily  . carbidopa-levodopa  1 tablet Oral TID  . metoprolol tartrate  25 mg Oral BID  . QUEtiapine  12.5 mg Oral QHS  . vitamin B-12  1,000 mcg Oral Daily   Continuous Infusions: . sodium chloride    . ceFEPime (MAXIPIME) IV Stopped (05/29/19 0743)  . [START ON 05/30/2019] vancomycin     PRN Meds:.      Subjective:   [name] Powell was seen and examined today.  Lethargic and very weak, no fevers. Difficult to obtain review of system from the patient. No acute issues overnight. No active nausea vomiting or diarrhea.  Objective:   Vitals:   05/29/19 1130 05/29/19 1200 05/29/19 1215 05/29/19 1230  BP: (!) 148/56 (!) 146/52  (!) 131/55  Pulse: 60 (!) 59 (!) 59 60  Resp: 20   20  Temp:      TempSrc:      SpO2: 100% 100% 100% 100%    Intake/Output Summary (Last 24 hours) at 05/29/2019 1245 Last data filed at 05/29/2019 0743 Gross per 24 hour  Intake 715 ml  Output --  Net 715 ml     Wt Readings from Last 3 Encounters:  04/10/19 75.6 kg  04/02/19 78.9 kg  03/05/19 82 kg     Exam  General: Awake but lethargic, ill-appearing, no acute  distress  Eyes:   HEENT:  Atraumatic, normocephalic  Cardiovascular: S1 S2 auscultated,RRR  Respiratory: Bilateral scattered rhonchi  Gastrointestinal: Soft, nontender, nondistended, + bowel sounds  Ext: 1+  pedal edema bilaterally  Neuro: Strength 5/5 upper and lower extremities bilaterally  Musculoskeletal: No digital cyanosis, clubbing  Skin: No rashes  Psych: Lethargic, weak   Data Reviewed:  I have personally reviewed following labs and imaging studies  Micro Results Recent Results (from the past 240 hour(s))  Urine culture     Status: None (Preliminary result)   Collection Time: 05/28/19 12:51 PM   Specimen: Urine, Random  Result Value Ref Range Status   Specimen Description   Final    URINE, RANDOM Performed at [facility], [address], [place], [place] [address]    Special Requests   Final    NONE Performed at [facility], [place] [address], [place], [place] [address]    Culture PENDING  Incomplete   Report Status PENDING  Incomplete  Respiratory Panel by RT PCR (Flu A&B, Covid) - Nasopharyngeal Swab     Status: None   Collection Time: 05/28/19  1:44 PM   Specimen: Nasopharyngeal Swab  Result Value Ref Range Status   SARS Coronavirus 2 by RT PCR NEGATIVE NEGATIVE Final    Comment: (NOTE) SARS-CoV-2 target nucleic acids are NOT DETECTED. The SARS-CoV-2 RNA is generally detectable in upper respiratoy specimens during the acute phase of infection. The lowest concentration of SARS-CoV-2 viral copies this assay can detect is 131 copies/mL. A negative result does not preclude SARS-Cov-2 infection and should not be used as the sole basis for treatment or other patient management decisions. A negative result may occur with  improper specimen collection/handling, submission of specimen other than nasopharyngeal swab, presence of viral mutation(s) within the areas targeted by this assay, and inadequate number of viral copies (<131  copies/mL). A negative result must be combined with clinical observations, patient history, and epidemiological information. The expected result is Negative. Fact Sheet for Patients:  PinkCheek.be Fact Sheet for Healthcare Providers:  GravelBags.it This test is not yet ap proved or cleared by the [place] FDA and  has been authorized for detection and/or diagnosis of SARS-CoV-2 by FDA under an Emergency Use Authorization (EUA). This EUA will remain  in effect (meaning this test can be used) for the duration of the COVID-19 declaration under Section 564(b)(1) of the Act, 21 U.S.C. section 360bbb-3(b)(1), unless the authorization is terminated or revoked sooner.    Influenza A by PCR NEGATIVE NEGATIVE Final   Influenza B by PCR NEGATIVE NEGATIVE Final    Comment: (NOTE) The Xpert Xpress SARS-CoV-2/FLU/RSV assay is intended as an aid in  the diagnosis of influenza from Nasopharyngeal swab specimens and  should not be used as a sole basis for treatment. Nasal washings and  aspirates are unacceptable for Xpert Xpress SARS-CoV-2/FLU/RSV  testing. Fact Sheet for Patients: PinkCheek.be Fact Sheet for Healthcare Providers: GravelBags.it This test is not yet approved or cleared by the [place] FDA and  has been authorized for detection and/or diagnosis of SARS-CoV-2 by  FDA under an Emergency Use Authorization (EUA). This EUA will remain  in effect (meaning this test can be used) for the duration of the  Covid-19 declaration under Section 564(b)(1) of the Act, 21  U.S.C. section 360bbb-3(b)(1), unless the authorization is  terminated or revoked. Performed at [facility], [place] [address], [place], [place] [address]   SARS CORONAVIRUS 2 (TAT 6-24 HRS) Nasopharyngeal Nasopharyngeal Swab     Status: None   Collection Time: 05/28/19  3:38 PM    Specimen: Nasopharyngeal Swab  Result Value Ref Range Status   SARS Coronavirus 2 NEGATIVE NEGATIVE Final    Comment: (NOTE) SARS-CoV-2 target nucleic acids are NOT DETECTED. The SARS-CoV-2 RNA  is generally detectable in upper and lower respiratory specimens during the acute phase of infection. Negative results do not preclude SARS-CoV-2 infection, do not rule out co-infections with other pathogens, and should not be used as the sole basis for treatment or other patient management decisions. Negative results must be combined with clinical observations, patient history, and epidemiological information. The expected result is Negative. Fact Sheet for Patients: SugarRoll.be Fact Sheet for Healthcare Providers: https://www.woods-mathews.com/ This test is not yet approved or cleared by the [place] FDA and  has been authorized for detection and/or diagnosis of SARS-CoV-2 by FDA under an Emergency Use Authorization (EUA). This EUA will remain  in effect (meaning this test can be used) for the duration of the COVID-19 declaration under Section 56 4(b)(1) of the Act, 21 U.S.C. section 360bbb-3(b)(1), unless the authorization is terminated or revoked sooner. Performed at [facility], [place] [address] [place], [place] [address]     Radiology Reports DG Chest 1 View  Result Date: 05/28/2019 CLINICAL DATA:  Recent falls with hip pain, initial encounter EXAM: CHEST  1 VIEW COMPARISON:  04/09/2019 FINDINGS: Cardiac shadow is enlarged but stable. Pacing device is again seen as well as postoperative change. The lungs are hypoinflated with patchy opacity throughout both lungs. No acute bony abnormality is seen. IMPRESSION: Patchy airspace opacities within both lungs increased when compared with the prior exam. This likely represents atypical/viral pneumonia. Electronically Signed   By: Inez Catalina M.D.   On: 05/28/2019 12:56   CT Head Wo  Contrast  Result Date: 05/28/2019 CLINICAL DATA:  86 year old who fell twice last week, most recently 3 days ago, presenting with increasing lethargy and anorexia. Patient is currently anticoagulated with Eliquis due to atrial fibrillation. Initial encounter. EXAM: CT HEAD WITHOUT CONTRAST CT CERVICAL SPINE WITHOUT CONTRAST TECHNIQUE: Multidetector CT imaging of the head and cervical spine was performed following the standard protocol without intravenous contrast. Multiplanar CT image reconstructions of the cervical spine were also generated. COMPARISON:  CT head 07/26/2016 and earlier. CT cervical spine 10/13/2011. FINDINGS: CT HEAD FINDINGS Brain: Moderate to severe cortical atrophy, severe deep atrophy and mild to moderate cerebellar atrophy, unchanged. Moderate to severe changes of small vessel disease of the white matter diffusely, unchanged to somewhat progressive since 2018. Vascular: Severe BILATERAL carotid siphon and mild BILATERAL vertebral artery atherosclerosis. No hyperdense vessel. Skull: No skull fracture or other focal osseous abnormality involving the skull. Sinuses/Orbits: Visualized paranasal sinuses, bilateral mastoid air cells and bilateral middle ear cavities well-aerated. Visualized orbits and globes normal in appearance. Other: None. CT CERVICAL SPINE FINDINGS Alignment: Straightening of the usual cervical lordosis. Facet joints anatomically aligned with diffuse degenerative changes. Skull base and vertebrae: No fractures identified involving the cervical spine. 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 Coronal reformatted images demonstrate an intact craniocervical junction, intact dens and intact lateral masses throughout. Soft tissues and spinal canal: No evidence of paraspinous or spinal canal hematoma. Moderate spinal stenosis at C4-5 and at C6-7 and mild spinal stenosis at C5-6, all related to the POSTERIOR longitudinal ligament ossification.  Disc levels: Severe degenerative changes at the C1-C2 articulation. Facet and uncinate hypertrophy account for multilevel foraminal stenoses including mild LEFT C2-3, moderate to severe LEFT C3-4, severe LEFT and mild RIGHT C4-5, mild LEFT C5-6, mild RIGHT C6-7. Upper chest: Approximate 3.0 x 2.3 cm nodule arising from the LOWER pole of the RIGHT lobe of the thyroid gland, unchanged since the 2013 CT and therefore benign. Focal hyperlucency  in the RIGHT lung apex likely indicating localized air trapping. Other: Mild-to-moderate BILATERAL cervical carotid atherosclerosis. IMPRESSION: 1. No acute intracranial abnormality. 2. Stable moderate to severe generalized atrophy. 3. Moderate to severe chronic microvascular ischemic changes of the white matter diffusely. 4. No cervical spine fractures identified. 5. Multilevel degenerative changes and DISH involving the cervical spine with spinal stenosis at C4-5, C5-6 and C6-7 and multilevel foraminal stenoses as detailed above. Electronically Signed   By: Evangeline Dakin M.D.   On: 05/28/2019 13:10   CT Cervical Spine Wo Contrast  Result Date: 05/28/2019 CLINICAL DATA:  86 year old who fell twice last week, most recently 3 days ago, presenting with increasing lethargy and anorexia. Patient is currently anticoagulated with Eliquis due to atrial fibrillation. Initial encounter. EXAM: CT HEAD WITHOUT CONTRAST CT CERVICAL SPINE WITHOUT CONTRAST TECHNIQUE: Multidetector CT imaging of the head and cervical spine was performed following the standard protocol without intravenous contrast. Multiplanar CT image reconstructions of the cervical spine were also generated. COMPARISON:  CT head 07/26/2016 and earlier. CT cervical spine 10/13/2011. FINDINGS: CT HEAD FINDINGS Brain: Moderate to severe cortical atrophy, severe deep atrophy and mild to moderate cerebellar atrophy, unchanged. Moderate to severe changes of small vessel disease of the white matter diffusely, unchanged to  somewhat progressive since 2018. Vascular: Severe BILATERAL carotid siphon and mild BILATERAL vertebral artery atherosclerosis. No hyperdense vessel. Skull: No skull fracture or other focal osseous abnormality involving the skull. Sinuses/Orbits: Visualized paranasal sinuses, bilateral mastoid air cells and bilateral middle ear cavities well-aerated. Visualized orbits and globes normal in appearance. Other: None. CT CERVICAL SPINE FINDINGS Alignment: Straightening of the usual cervical lordosis. Facet joints anatomically aligned with diffuse degenerative changes. Skull base and vertebrae: No fractures identified involving the cervical spine. 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 Coronal reformatted images demonstrate an intact craniocervical junction, intact dens and intact lateral masses throughout. Soft tissues and spinal canal: No evidence of paraspinous or spinal canal hematoma. Moderate spinal stenosis at C4-5 and at C6-7 and mild spinal stenosis at C5-6, all related to the POSTERIOR longitudinal ligament ossification. Disc levels: Severe degenerative changes at the C1-C2 articulation. Facet and uncinate hypertrophy account for multilevel foraminal stenoses including mild LEFT C2-3, moderate to severe LEFT C3-4, severe LEFT and mild RIGHT C4-5, mild LEFT C5-6, mild RIGHT C6-7. Upper chest: Approximate 3.0 x 2.3 cm nodule arising from the LOWER pole of the RIGHT lobe of the thyroid gland, unchanged since the 2013 CT and therefore benign. Focal hyperlucency in the RIGHT lung apex likely indicating localized air trapping. Other: Mild-to-moderate BILATERAL cervical carotid atherosclerosis. IMPRESSION: 1. No acute intracranial abnormality. 2. Stable moderate to severe generalized atrophy. 3. Moderate to severe chronic microvascular ischemic changes of the white matter diffusely. 4. No cervical spine fractures identified. 5. Multilevel degenerative changes and DISH  involving the cervical spine with spinal stenosis at C4-5, C5-6 and C6-7 and multilevel foraminal stenoses as detailed above. Electronically Signed   By: Evangeline Dakin M.D.   On: 05/28/2019 13:10   CT HIP RIGHT WO CONTRAST  Result Date: 05/28/2019 CLINICAL DATA:  Increasing right hip pain since a fall 2 days ago. EXAM: CT OF THE RIGHT HIP WITHOUT CONTRAST TECHNIQUE: Multidetector CT imaging of the right hip was performed according to the standard protocol. Multiplanar CT image reconstructions were also generated. COMPARISON:  Radiographs dated 05/28/2019 FINDINGS: Bones/Joint/Cartilage There is no fracture or dislocation. Minimal degenerative changes of the superior aspect of the right acetabulum. No joint effusion. Muscles  and Tendons Normal. Soft tissues There is a 11 x 5 x 2 cm hematoma in the subcutaneous fat just lateral to the right greater trochanter with adjacent subcutaneous soft tissue stranding consistent with bruising. IMPRESSION: 1. No acute osseous abnormality of the right hip. 2. Hematoma in the subcutaneous fat just lateral to the right greater trochanter. Electronically Signed   By: Lorriane Shire M.D.   On: 05/28/2019 15:16   DG HIP UNILAT WITH PELVIS 2-3 VIEWS RIGHT  Result Date: 05/28/2019 CLINICAL DATA:  Recent falls with right hip pain, initial encounter EXAM: DG HIP (WITH OR WITHOUT PELVIS) 2-3V RIGHT COMPARISON:  05/23/2019 FINDINGS: Pelvic ring is intact. Degenerative changes of the lumbar spine are seen. No acute fracture or dislocation is noted. Degenerative changes of the hip joints are noted. Prostate brachytherapy seeds are seen. IMPRESSION: No acute abnormality noted. Electronically Signed   By: Inez Catalina M.D.   On: 05/28/2019 12:54    Lab Data:  CBC: Recent Labs  Lab 05/28/19 1111 05/29/19 0515  WBC 11.4* 13.4*  NEUTROABS 6.6  --   HGB 7.0* 8.1*  HCT 23.3* 26.1*  MCV 113.7* 106.5*  PLT 193 109   Basic Metabolic Panel: Recent Labs  Lab 05/28/19 1111  05/29/19 0515  NA 138 139  K 5.4* 5.0  CL 104 105  CO2 25 24  GLUCOSE 188* 197*  BUN 59* 56*  CREATININE 2.97* 2.55*  CALCIUM 8.4* 8.8*   GFR: CrCl cannot be calculated (Unknown ideal weight.). Liver Function Tests: Recent Labs  Lab 05/28/19 1111  AST 15  ALT 7  ALKPHOS 107  BILITOT 0.5  PROT 7.0  ALBUMIN 3.1*   No results for input(s): LIPASE, AMYLASE in the last 168 hours. No results for input(s): AMMONIA in the last 168 hours. Coagulation Profile: Recent Labs  Lab 05/28/19 1111  INR 1.5*   Cardiac Enzymes: No results for input(s): CKTOTAL, CKMB, CKMBINDEX, TROPONINI in the last 168 hours. BNP (last 3 results) No results for input(s): PROBNP in the last 8760 hours. HbA1C: No results for input(s): HGBA1C in the last 72 hours. CBG: No results for input(s): GLUCAP in the last 168 hours. Lipid Profile: No results for input(s): CHOL, HDL, LDLCALC, TRIG, CHOLHDL, LDLDIRECT in the last 72 hours. Thyroid Function Tests: No results for input(s): TSH, T4TOTAL, FREET4, T3FREE, THYROIDAB in the last 72 hours. Anemia Panel: Recent Labs    05/28/19 1111 05/28/19 1538  VITAMINB12  --  746  FOLATE  --  12.5  FERRITIN  --  120  TIBC  --  305  IRON  --  17*  RETICCTPCT 2.8  --    Urine analysis:    Component Value Date/Time   COLORURINE YELLOW 05/28/2019 1251   APPEARANCEUR CLEAR 05/28/2019 1251   LABSPEC 1.014 05/28/2019 1251   PHURINE 5.0 05/28/2019 1251   GLUCOSEU NEGATIVE 05/28/2019 1251   HGBUR NEGATIVE 05/28/2019 1251   [place] 05/28/2019 1251   KETONESUR NEGATIVE 05/28/2019 1251   PROTEINUR 30 (A) 05/28/2019 1251   UROBILINOGEN 1.0 02/19/2015 2354   NITRITE NEGATIVE 05/28/2019 1251   LEUKOCYTESUR NEGATIVE 05/28/2019 1251     Kemper Heupel M.D. [facility] 05/29/2019, 12:45 PM   Call night coverage person covering after 7pm

## 2019-05-29 NOTE — ED Notes (Signed)
Call wife with update

## 2019-05-30 ENCOUNTER — Inpatient Hospital Stay (HOSPITAL_COMMUNITY): Payer: Medicare HMO

## 2019-05-30 LAB — CBC
HCT: 22.3 % — ABNORMAL LOW (ref 39.0–52.0)
Hemoglobin: 6.7 g/dL — CL (ref 13.0–17.0)
MCH: 32.7 pg (ref 26.0–34.0)
MCHC: 30 g/dL (ref 30.0–36.0)
MCV: 108.8 fL — ABNORMAL HIGH (ref 80.0–100.0)
Platelets: 168 10*3/uL (ref 150–400)
RBC: 2.05 MIL/uL — ABNORMAL LOW (ref 4.22–5.81)
RDW: 20.5 % — ABNORMAL HIGH (ref 11.5–15.5)
WBC: 8.7 10*3/uL (ref 4.0–10.5)
nRBC: 0 % (ref 0.0–0.2)

## 2019-05-30 LAB — BASIC METABOLIC PANEL
Anion gap: 7 (ref 5–15)
BUN: 57 mg/dL — ABNORMAL HIGH (ref 8–23)
CO2: 24 mmol/L (ref 22–32)
Calcium: 8.4 mg/dL — ABNORMAL LOW (ref 8.9–10.3)
Chloride: 110 mmol/L (ref 98–111)
Creatinine, Ser: 2.24 mg/dL — ABNORMAL HIGH (ref 0.61–1.24)
GFR calc Af Amer: 28 mL/min — ABNORMAL LOW (ref >60)
GFR calc non Af Amer: 24 mL/min — ABNORMAL LOW (ref >60)
Glucose, Bld: 170 mg/dL — ABNORMAL HIGH (ref 70–99)
Potassium: 4.4 mmol/L (ref 3.5–5.1)
Sodium: 141 mmol/L (ref 135–145)

## 2019-05-30 LAB — MRSA PCR SCREENING: MRSA by PCR: NEGATIVE

## 2019-05-30 LAB — LEGIONELLA PNEUMOPHILA SEROGP 1 UR AG: L. pneumophila Serogp 1 Ur Ag: NEGATIVE

## 2019-05-30 LAB — GLUCOSE, CAPILLARY
Glucose-Capillary: 132 mg/dL — ABNORMAL HIGH (ref 70–99)
Glucose-Capillary: 136 mg/dL — ABNORMAL HIGH (ref 70–99)
Glucose-Capillary: 138 mg/dL — ABNORMAL HIGH (ref 70–99)
Glucose-Capillary: 170 mg/dL — ABNORMAL HIGH (ref 70–99)

## 2019-05-30 LAB — PREPARE RBC (CROSSMATCH)

## 2019-05-30 MED ORDER — SODIUM CHLORIDE 0.9% IV SOLUTION: Freq: Once | INTRAVENOUS | Status: AC

## 2019-05-30 NOTE — Progress Notes (Signed)
SLP received call from [facility] who requests pt to undergo MBS this pm (MD desired MBS today per RN).  RN reports pt is much more alert and ambulated with PT down the hall.  Phoned xray who was agreeable to conduct MBS this pm.  Full report to follow.    MBS will allow observation of swallow musculature to help with determining possible effective compensation strategies and least restrictive diet.    Also, dysphagia management/treatment/strengthening treatment plan may be helpful - especially if goal is full code with medical treatment.   Kathleen Lime, MS [place] Office [phone]

## 2019-05-30 NOTE — Progress Notes (Signed)
CRITICAL VALUE STICKER  CRITICAL VALUE: HGB 6.7  DATE & TIME NOTIFIED: 05/30/19 @ 0410  MESSENGER (representative from lab): Ubaldo Glassing  MD NOTIFIED: Sharlet Salina  TIME OF NOTIFICATION: [id number]  RESPONSE: Order for 2 units PRBCs received

## 2019-05-30 NOTE — Progress Notes (Signed)
Per MD, will recheck Hgb in AM. Second unit infusing at this time.

## 2019-05-30 NOTE — Evaluation (Signed)
Physical Therapy Evaluation Patient Details Name: [name] Powell MRN: [id number] DOB: 11-Jan-1923 Today's Date: 05/30/2019   History of Present Illness  Patient is a 86 year old male with history of atrial fibrillation on anticoagulation, hypertension, history of process TIA, diabetes mellitus type 2, NIDDM, dyslipidemia, CAD, dementia presented from [place] independent living facility.  Pt had 2 falls since last week. Pt being admitted with acut resp failure with PNE and R hip hematoma.  Pt's hgb was 6.7 this am but he has received 2 units PRBC.  Clinical Impression  Pt admitted with above diagnosis. Pt requiring min-mod A transfers and increased time.  Able to ambulate 65' but unsteady and needing assist/cues for safety.  Pt with history of falls, unsure of level of assist at home, Would benefit from SNF if does not have strong able bodied assist at home due to fall risk. Pt currently with functional limitations due to the deficits listed below (see PT Problem List). Pt will benefit from skilled PT to increase their independence and safety with mobility to allow discharge to the venue listed below.       Follow Up Recommendations SNF((possibly home if has able bodied 24 hr support))    Equipment Recommendations  None recommended by PT    Recommendations for Other Services       Precautions / Restrictions Precautions Precautions: Fall      Mobility  Bed Mobility Overal bed mobility: Needs Assistance Bed Mobility: Supine to Sit     Supine to sit: Min assist;Mod assist     General bed mobility comments: increased time- min A to sit and mod A to scoot to edge  Transfers Overall transfer level: Needs assistance Equipment used: Rolling walker (2 wheeled) Transfers: Sit to/from Stand Sit to Stand: Min assist         General transfer comment: increased time; cues for safe hand placement  Ambulation/Gait Ambulation/Gait assistance: Min assist Gait Distance (Feet): 70  Feet Assistive device: Rolling walker (2 wheeled) Gait Pattern/deviations: Decreased stride length;Trunk flexed;Narrow base of support Gait velocity: decreased   General Gait Details: cues for posture and increased stride lenth  Stairs            Wheelchair Mobility    Modified Rankin (Stroke Patients Only)       Balance Overall balance assessment: Needs assistance Sitting-balance support: Bilateral upper extremity supported;Feet supported Sitting balance-Leahy Scale: Fair     Standing balance support: Bilateral upper extremity supported Standing balance-Leahy Scale: Poor                               Pertinent Vitals/Pain Pain Assessment: No/denies pain    Home Living Family/patient expects to be discharged to:: Unsure Living Arrangements: Spouse/significant other Available Help at Discharge: Family;Personal care attendant;Available 24 hours/day Type of Home: Apartment Home Access: Stairs to enter Entrance Stairs-Rails: Right Entrance Stairs-Number of Steps: level entry at front, 5-6 steps in back Home Layout: One level Home Equipment: Grab bars - tub/shower;Grab bars - toilet;Shower seat - built in;Shower seat;Walker - 2 wheels;Cane - single point;Wheelchair - [employer] - 4 wheels Additional Comments: Wife with him at night and 2 aides come in ~12 hours a day    Prior Function Level of Independence: Needs assistance   Gait / Transfers Assistance Needed: Uses the cane mostly inside. Has PT coming in 2x/week. Longer distance uses rollator.  ADL's / Homemaking Assistance Needed: Reports independent with ADLs but prior  last visit wife assisted with showers and dressing.  Aides assisted with housework, bathroom, Dr. Visits, etc  Comments: Pt questionable/limited historian: above taken from admission one month ago     Hand Dominance        Extremity/Trunk Assessment   Upper Extremity Assessment Upper Extremity Assessment: Overall WFL for  tasks assessed    Lower Extremity Assessment Lower Extremity Assessment: LLE deficits/detail;RLE deficits/detail RLE Deficits / Details: Strength grossly 4/5 throughout; bil knee ext lacking ~10 degrees LLE Deficits / Details: Strength grossly 4/5 throughout; bil knee ext lacking ~10 degrees    Cervical / Trunk Assessment Cervical / Trunk Assessment: Kyphotic  Communication   Communication: Other (comment)(delayed response)  Cognition Arousal/Alertness: Awake/alert Behavior During Therapy: Flat affect Overall Cognitive Status: No family/caregiver present to determine baseline cognitive functioning                                 General Comments: Delayed response      General Comments General comments (skin integrity, edema, etc.): Pt was on RA at arrival with sats 95%;  with activity dropped to 76% but pt was asymptomatic/denied dyspnea.  Checked with 2nd pulse ox and up to 86% but after resting.  Notified RN, pt back on O2 with sats 95%.    Exercises     Assessment/Plan    PT Assessment Patient needs continued PT services  PT Problem List Decreased strength;Decreased mobility;Decreased safety awareness;Decreased range of motion;Decreased activity tolerance;Cardiopulmonary status limiting activity;Decreased balance;Decreased knowledge of use of DME       PT Treatment Interventions DME instruction;Therapeutic activities;Gait training;Therapeutic exercise;Patient/family education;Balance training;Functional mobility training    PT Goals (Current goals can be found in the Care Plan section)  Acute Rehab PT Goals Patient Stated Goal: not stated PT Goal Formulation: With patient Time For Goal Achievement: 06/14/19 Potential to Achieve Goals: Fair    Frequency Min 3X/week   Barriers to discharge Decreased caregiver support May have 24 hr support (per prior admission) but unsure of level of assist able to provide    Co-evaluation               AM-PAC  PT "6 Clicks" Mobility  Outcome Measure Help needed turning from your back to your side while in a flat bed without using bedrails?: A Little Help needed moving from lying on your back to sitting on the side of a flat bed without using bedrails?: A Lot Help needed moving to and from a bed to a chair (including a wheelchair)?: A Little Help needed standing up from a chair using your arms (e.g., wheelchair or bedside chair)?: A Little Help needed to walk in hospital room?: A Little Help needed climbing 3-5 steps with a railing? : A Lot 6 Click Score: 16    End of Session Equipment Utilized During Treatment: Gait belt;Oxygen Activity Tolerance: Patient tolerated treatment well Patient left: in chair;with chair alarm set;with call bell/phone within reach Nurse Communication: Mobility status PT Visit Diagnosis: Unsteadiness on feet (R26.81);History of falling (Z91.81);Muscle weakness (generalized) (M62.81)    Time: 8850-2774 PT Time Calculation (min) (ACUTE ONLY): 30 min   Charges:   PT Evaluation $PT Eval Moderate Complexity: 1 Mod PT Treatments $Gait Training: 8-22 mins        Maggie Font, PT Acute Rehab Services Pager [phone] [facility] [phone] [facility] [phone]   [name] 05/30/2019, 2:13 PM

## 2019-05-30 NOTE — Evaluation (Signed)
Clinical/Bedside Swallow Evaluation Patient Details  Name: [name] Powell MRN: [id number] Date of Birth: 09-Sep-1922  Today's Date: 05/30/2019 Time: SLP Start Time (ACUTE ONLY): 0755 SLP Stop Time (ACUTE ONLY): 0830 SLP Time Calculation (min) (ACUTE ONLY): 35 min  Past Medical History:  Past Medical History:  Diagnosis Date  . Anemia    chronic  . Atrial fibrillation ([place])   . Cancer ([place])   . CHB (complete heart block) ([place])   . Coronary artery disease   . Diabetes mellitus   . Dyslipidemia   . H/O prostate cancer   . Hypertension   . New onset atrial flutter, persistent 07/04/2014  . Pacemaker 12/12/2006   Medtronic adapta  . Pleural effusion, left   . Pneumonia 09/2015  . Prostate cancer ([place])   . 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 Ventricular tachycardia (paroxysmal) ([place]) 03/28/2014   Past Surgical History:  Past Surgical History:  Procedure Laterality Date  . CORONARY ARTERY BYPASS GRAFT  09/04/2001   LIMA to LAD,SVG to diagonal,SVG to ramus intermedius,SVG to PDA  . NM MYOVIEW LTD  01/09/2010   no ischemia  . PACEMAKER INSERTION  12/12/2006   Medtronic adapta  . PPM GENERATOR CHANGEOUT N/A 02/02/2017   Procedure: PPM GENERATOR CHANGEOUT - DUAL CHAMBER;  Surgeon: [name], MD;  Location: [facility];  Service: Cardiovascular;  Laterality: N/A;  . PROSTATE SURGERY  2001   cancer  . TONSILLECTOMY     HPI:  86 yo male adm to [facility] with FTT, AMS, diagnosed with HCAP with right hip pain and falls.  Pt found to have a large hematoma on right hip.  PMH + for Parkinsons, pna 02/2015, CAD, C4-C7 DISH and he admits to some difficulties swallowing.  Pt is a FULL code and he states he "wants them to decide that" when SLP inquired (SLP asked due to concern for level of dysphagia).  Pt CT showed moderate atrophy.  Pt is on 2 liters oxygen and is receiving a blood transfusion.  CXR 1/18 showed bilateral patchy opacities  incr since prior exam and consistent with atypical/viral pna per radiologist report.   Assessment / Plan / Recommendation Clinical Impression  Pt demonstrating clinical indication of mild oral and severe pharyngeal dysphagia.  Suspect sensorimotor deficits due to his Parkinsons' disease.  Very weak phonation (near whisper) noted which pt states is worse than normal. No focal CN deficits apparent but generalized weakness present.  Minimal intake of ice chips, tsps water and 2 small applesaue boluses provided.  Multiple swallows observed across boluses with wet voice with moderate throat clearing post-tsps of thin water concerning for silent laryngeal penetration and/or aspiration.  Cued cough, throat clearing did not fully clear wet voice.  In additition, pt likely with signifcant pharyngeal retention - likely due to weakness and ? impact of DISH.  Pt's admits to h/o coughing with intake but does not recall specific details of dysphagia (items problematic, when occurs, etc).  He desires "wellness" during this hospital visit.    At this time, SLP recommends pt be npo x ice chip and small amount of applesauce with crushed medication. He will benefit from [facility] dependent on goals of care to allow instrumental swallow evaluation of swallow given his Parkinsons.  Educated pt to recommendations and findings of evaluation using teach back.  Pt is not HOH but delayed responses present and he is nearly blind per his statement.  Given pt has had 3 admits in the last six months, highly recommend a palliative consult to establish goals of care.    Of note, pt states he receives help to bathe and dress but feeds himself independently.   SLP Visit Diagnosis: Dysphagia, oropharyngeal phase (R13.12)    Aspiration Risk  Severe aspiration risk;Risk for inadequate nutrition/hydration    Diet Recommendation NPO;Ice chips PRN after oral care(medication with SMALL amount of applesauce)   Medication Administration: Crushed  with puree Compensations: Slow rate;Small sips/bites;Multiple dry swallows after each bite/sip Postural Changes: Remain upright for at least 30 minutes after po intake;Seated upright at 90 degrees    Other  Recommendations Oral Care Recommendations: Oral care QID   Follow up Recommendations        Frequency and Duration min 2x/week  2 weeks       Prognosis Prognosis for Safe Diet Advancement: Fair Barriers to Reach Goals: Severity of deficits;Time post onset      Swallow Study   General HPI: 86 yo male adm to [facility] with FTT, AMS, diagnosed with HCAP with right hip pain and falls.  Pt found to have a large hematoma on right hip.  PMH + for Parkinsons, pna 02/2015, CAD, C4-C7 DISH and he admits to some difficulties swallowing.  Pt is a FULL code and he states he "wants them to decide that" when SLP inquired (SLP asked due to concern for level of dysphagia).  Pt CT showed moderate atrophy.  Pt is on 2 liters oxygen and is receiving a blood transfusion.  CXR 1/18 showed bilateral patchy opacities incr since prior exam and consistent with atypical/viral pna per radiologist report. Type of Study: Bedside Swallow Evaluation Diet Prior to this Study: Dysphagia 3 (soft);Thin liquids Temperature Spikes Noted: No Respiratory Status: Nasal cannula History of Recent Intubation: No Behavior/Cognition: Alert;Cooperative;Pleasant mood;Other (Comment)(delayed responses) Oral Cavity Assessment: Dry Oral Care Completed by SLP: No Oral Cavity - Dentition: Adequate natural dentition Vision: (assists to hold his own cup) Self-Feeding Abilities: Needs assist Patient Positioning: Upright in bed Baseline Vocal Quality: Low vocal intensity;Hoarse Volitional Cough: Weak Volitional Swallow: Unable to elicit    Oral/Motor/Sensory Function Overall Oral Motor/Sensory Function: Generalized oral weakness   Ice Chips Ice chips: Impaired Oral Phase Impairments: Reduced labial seal Oral Phase Functional  Implications: Prolonged oral transit Pharyngeal Phase Impairments: Suspected delayed Swallow;Wet Vocal Quality;Multiple swallows;Decreased hyoid-laryngeal movement   Thin Liquid Thin Liquid: Impaired Oral Phase Impairments: Reduced labial seal Pharyngeal  Phase Impairments: Suspected delayed Swallow;Multiple swallows;Decreased hyoid-laryngeal movement;Wet Vocal Quality    Nectar Thick Nectar Thick Liquid: Not tested   Honey Thick Honey Thick Liquid: Not tested   Puree Puree: Impaired Presentation: Spoon Oral Phase Impairments: Reduced labial seal Oral Phase Functional Implications: Prolonged oral transit Pharyngeal Phase Impairments: Suspected delayed Swallow;Multiple swallows;Decreased hyoid-laryngeal movement;Other (comments);Wet Vocal Quality(multiple swallows across all boluses (SMALL boluses - 4-5 swallows))   Solid     Solid: Not tested      Macario Golds 05/30/2019,9:00 AM  Kathleen Lime, MS [place] [facility] [phone]

## 2019-05-30 NOTE — Progress Notes (Signed)
This RN notified wife of pt needing blood transfusion in order to get verbal consent since the pt is lethargic. Barron Schmid, RN and Betsey Holiday, RN verified verbal consent from pt wife, Rourke Mcquitty, for pt to receive 2 units of PRBCs.

## 2019-05-30 NOTE — Progress Notes (Signed)
Patient's wife, [name] Powell, updated via phone by this RN. Requesting an update from MD Ghimire also- MD aware.

## 2019-05-30 NOTE — Progress Notes (Signed)
Modified Barium Swallow Progress Note  Patient Details  Name: [name] Powell MRN: [id number] Date of Birth: January 06, 1923  Today's Date: 05/30/2019  Modified Barium Swallow completed.  Full report located under Chart Review in the Imaging Section.  Brief recommendations include the following:  Clinical Impression  Pt presents with moderately severe oropharyngeal sensorimotor dysphagia likely due to his Parkinson's disease with some component of obstructive characteristics due to DISH.  Lingual pumping, decreased propulsion observed across consistencies.  Pharyngeal swallow c/b decreased laryngeal elevation/closure and compromised tongue base retraction.  Above characteristics result in pharyngeal retention with all boluses and laryngeal penetration/minimal aspiration of liquids.  Pharyngeal secretion retention also noted which mixed with barium and was also mildly aspirated.  Various postures including minimal head turn left (*pt with very limited neck ROM-and only to the left approx 30*) with minimal chin tuck posture did not improve pharyngeal clearance/prevent pharyngeal retention.  Dry swallows and liquid swallows helpful to decrease pharyngeal retention.  Pt only senses large amounts of pharyngeal retention.    Cued cough/"Hock" was most helpful to expel 90% of retained barium from pharynx/vallecular region and cleared minimal upper larynx/trachea aspiration and this has likely helped pt with airway protection.   Pt did not clear aspirates that spilled deeper into trachea with cued cough but he did only mildly aspirate even when taxed.  No reflexive cough produced during entire MBS with swallowing barium (but observed swallowing liquids).   Given pt's propensity to cough with po and combined with results of MBS, certainly pt experiences aspiration for which he has managed.  Pt's DISH narrows pharynx and contributes to his dysphagia also.   SLP expressed gratitude to pt for participating in [facility] and  using teach back educated him to findings.  Recommend to initiate swallow precautions/strategies to mitigate risk.  Continue to recommend consideration for [place] meeting given pt's recurrent admission, FTT, falls and progressive neurological disease.  Will follow to provide pt/family with education and initiate exercises to maximize swallow function.   Swallow Evaluation Recommendations       SLP Diet Recommendations: Dysphagia 2 (chopped) solids; extra gravy/sauces, Thin liquid(WATER with MEALS, small single sips)       Medication Administration: Crushed with puree       Compensations: Slow rate;Small sips/bites;Multiple dry swallows after each bite/sip;Follow solids with liquid(cough and expectorate) - Pt NEEDS SUCTION WITHIN REACH    Postural Changes: Seated upright at 90 degrees;Remain semi-upright after after feeds/meals (Comment)   Oral Care Recommendations: Oral care QID   Other Recommendations: Have oral suction available  Kathleen Lime, MS [facility] SLP Acute Rehab Services Office [phone]   Macario Golds 05/30/2019,4:46 PM

## 2019-05-30 NOTE — Progress Notes (Signed)
PROGRESS NOTE    [name] Powell  YHC:[id number] DOB: [birth year] DOA: 05/28/2019 PCP: [name], MD    Brief Narrative:  From admitting provider, 86 year old male with history of A. fib on anticoagulation, hypertension, history of TIA, type 2 diabetes, dyslipidemia, coronary artery disease, Parkinson's dementia from [facility] brought to the emergency room with 2 falls and have been having increasing lethargic and not eating well. Patient was complaining of right hip pain on presentation. No fever. COVID-19 negative. Admitted with right hip hematoma, bilateral lower lobe pneumonia.   Assessment & Plan:   Principal Problem:   Acute respiratory failure with hypoxia (HCC) Active Problems:   Pacemaker dependent - [employer]   CAD s/p CABG 2003   DM2 (diabetes mellitus, type 2) (HCC)   HTN (hypertension)   Chronic diastolic congestive heart failure (HCC)   Atrial fibrillation (HCC)   Weakness generalized   Acute encephalopathy   HCAP (healthcare-associated pneumonia)   AKI (acute kidney injury) ([place])   Right hip pain  Bilateral lower lobe pneumonia with hypoxia: Clinically improving. Remains on vancomycin and cefepime with history of recurrent admissions and also suspect aspiration.  Cultures are negative so far. We will continue antibiotics today. Seen by speech therapy, severe dysphagia and aspiration risk, acute on chronic with underlying history of Parkinson's.  Chest physiotherapy, incentive spirometry, deep breathing exercises, sputum induction, mucolytic's and bronchodilators. Supplemental oxygen to keep saturations more than 90%.  Acute blood loss anemia: Acute on chronic anemia. Due to hematoma. 1 unit PRBC on admission 2 units PRBC today, recheck to ensure stabilization. X-ray negative for fracture or dislocation. CT scan of the hip showed 10 x 5 cm hematoma, stable on clinical examination. Start working with PT OT today.  AKI on CKD  stage III: Baseline creatinine 1.8-1.9. Presented creatinine 2.9. Poor oral intake. Lasix on hold. Continue gentle hydration.  CABG, CAD: Pacemaker dependent. Stable. Holding Eliquis.  Type 2 diabetes: On Januvia at home. On SSI in the hospital.  Permanent A. fib: On beta-blockers. Rate controlled. Will discontinue Eliquis altogether.  Advanced debility, Parkinson's with dementia and dysphagia: Patient with aspiration and advanced debility. Continue to work with speech, PT OT. Will consult palliative medicine, he may be an appropriate hospice candidate.  DVT prophylaxis: SCDs Code Status: Full code for now. Will discuss with patient's wife and also consult palliative Family Communication: We will call patient's wife. Disposition Plan: Yet to determine. May need to go to higher level of care like SNF.   Consultants:   Palliative medicine consulted  Procedures:   None  Antimicrobials:  Anti-infectives (From admission, onward)   Start     Dose/Rate Route Frequency Ordered Stop   05/30/19 1400  vancomycin (VANCOREADY) IVPB 750 mg/150 mL     750 mg 150 mL/hr over 60 Minutes Intravenous Every 48 hours 05/28/19 1729     05/29/19 0600  ceFEPIme (MAXIPIME) 2 g in sodium chloride 0.9 % 100 mL IVPB     2 g 200 mL/hr over 30 Minutes Intravenous Every 24 hours 05/28/19 1845     05/28/19 2200  aztreonam (AZACTAM) 1 g in sodium chloride 0.9 % 100 mL IVPB  Status:  Discontinued     1 g 200 mL/hr over 30 Minutes Intravenous Every 8 hours 05/28/19 1728 05/28/19 1844   05/28/19 1345  ceFEPIme (MAXIPIME) 1 g in sodium chloride 0.9 % 100 mL IVPB     1 g 200 mL/hr over 30 Minutes Intravenous  Once 05/28/19 1333  05/28/19 1437   05/28/19 1345  vancomycin (VANCOCIN) IVPB 1000 mg/200 mL premix     1,000 mg 200 mL/hr over 60 Minutes Intravenous  Once 05/28/19 1341 05/28/19 1753         Subjective: Patient seen and examined. Poor historian. Denies any complaints. No overnight events. 2 units  transfusion ordered with hemoglobin of 6.7. Speech therapy reported that he was aspirating every consistencies of food.  Objective: Vitals:   05/30/19 0539 05/30/19 0622 05/30/19 0647 05/30/19 0832  BP: (!) 145/65 136/65 (!) 150/64   Pulse: 64 60 (!) 59   Resp: 18 14 16    Temp: 98.7 F (37.1 C) 97.7 F (36.5 C) 97.7 F (36.5 C)   TempSrc: Oral Oral Oral   SpO2: 98% 100% 100%   Weight:    76.1 kg    Intake/Output Summary (Last 24 hours) at 05/30/2019 0921 Last data filed at 05/30/2019 0600 Gross per 24 hour  Intake 1050.25 ml  Output 500 ml  Net 550.25 ml   Filed Weights   05/30/19 0832  Weight: 76.1 kg    Examination:  General exam: Appears calm and comfortable, chronically sick looking. Mostly on room air. Respiratory system: Clear to auscultation. Respiratory effort normal. No added sounds. Cardiovascular system: S1 & S2 heard, irregularly irregular. Pacemaker present. No JVD, murmurs, rubs, gallops or clicks. No pedal edema. Gastrointestinal system: Abdomen is nondistended, soft and nontender. No organomegaly or masses felt. Normal bowel sounds heard. Central nervous system: Alert and oriented x1.. No focal neurological deficits. Generalized weakness. Extremities: Symmetric 5 x 5 power. Generalized weakness. Skin: No rashes, lesions or ulcers Psychiatry: Judgement and insight appear normal. Mood & affect flat. Patient has a firm swelling lateral aspect of the right thigh, nontender, about 8 x 5 cm in size. Distal neurovascular status intact.    Data Reviewed: I have personally reviewed following labs and imaging studies  CBC: Recent Labs  Lab 05/28/19 1111 05/29/19 0515 05/30/19 0335  WBC 11.4* 13.4* 8.7  NEUTROABS 6.6  --   --   HGB 7.0* 8.1* 6.7*  HCT 23.3* 26.1* 22.3*  MCV 113.7* 106.5* 108.8*  PLT 193 193 127   Basic Metabolic Panel: Recent Labs  Lab 05/28/19 1111 05/29/19 0515 05/30/19 0335  NA 138 139 141  K 5.4* 5.0 4.4  CL 104 105 110  CO2  25 24 24   GLUCOSE 188* 197* 170*  BUN 59* 56* 57*  CREATININE 2.97* 2.55* 2.24*  CALCIUM 8.4* 8.8* 8.4*   GFR: Estimated Creatinine Clearance: 19.5 mL/min (A) (by C-G formula based on SCr of 2.24 mg/dL (H)). Liver Function Tests: Recent Labs  Lab 05/28/19 1111  AST 15  ALT 7  ALKPHOS 107  BILITOT 0.5  PROT 7.0  ALBUMIN 3.1*   No results for input(s): LIPASE, AMYLASE in the last 168 hours. No results for input(s): AMMONIA in the last 168 hours. Coagulation Profile: Recent Labs  Lab 05/28/19 1111  INR 1.5*   Cardiac Enzymes: No results for input(s): CKTOTAL, CKMB, CKMBINDEX, TROPONINI in the last 168 hours. BNP (last 3 results) No results for input(s): PROBNP in the last 8760 hours. HbA1C: Recent Labs    05/29/19 0515  HGBA1C 6.6*   CBG: Recent Labs  Lab 05/29/19 1717 05/29/19 2142 05/30/19 0738  GLUCAP 142* 171* 132*   Lipid Profile: No results for input(s): CHOL, HDL, LDLCALC, TRIG, CHOLHDL, LDLDIRECT in the last 72 hours. Thyroid Function Tests: No results for input(s): TSH, T4TOTAL, FREET4, T3FREE, THYROIDAB in  the last 72 hours. Anemia Panel: Recent Labs    05/28/19 1111 05/28/19 1538  VITAMINB12  --  746  FOLATE  --  12.5  FERRITIN  --  120  TIBC  --  305  IRON  --  17*  RETICCTPCT 2.8  --    Sepsis Labs: Recent Labs  Lab 05/28/19 1111 05/28/19 1538  PROCALCITON <0.10  --   LATICACIDVEN  --  2.5*    Recent Results (from the past 240 hour(s))  Urine culture     Status: Abnormal   Collection Time: 05/28/19 12:51 PM   Specimen: Urine, Random  Result Value Ref Range Status   Specimen Description   Final    URINE, RANDOM Performed at [facility], [place] [address] [place], [place] [address]    Special Requests   Final    NONE Performed at [facility], [place] [address] [place], [place] [address]    Culture (A)  Final    <10,000 COLONIES/mL INSIGNIFICANT GROWTH Performed at [place] [address], [place], [place] [address]    Report Status 05/29/2019 FINAL  Final  Respiratory Panel by RT PCR (Flu A&B, Covid) - Nasopharyngeal Swab     Status: None   Collection Time: 05/28/19  1:44 PM   Specimen: Nasopharyngeal Swab  Result Value Ref Range Status   SARS Coronavirus 2 by RT PCR NEGATIVE NEGATIVE Final    Comment: (NOTE) SARS-CoV-2 target nucleic acids are NOT DETECTED. The SARS-CoV-2 RNA is generally detectable in upper respiratoy specimens during the acute phase of infection. The lowest concentration of SARS-CoV-2 viral copies this assay can detect is 131 copies/mL. A negative result does not preclude SARS-Cov-2 infection and should not be used as the sole basis for treatment or other patient management decisions. A negative result may occur with  improper specimen collection/handling, submission of specimen other than nasopharyngeal swab, presence of viral mutation(s) within the areas targeted by this assay, and inadequate number of viral copies (<131 copies/mL). A negative result must be combined with clinical observations, patient history, and epidemiological information. The expected result is Negative. Fact Sheet for Patients:  PinkCheek.be Fact Sheet for Healthcare Providers:  GravelBags.it This test is not yet ap proved or cleared by the [place] FDA and  has been authorized for detection and/or diagnosis of SARS-CoV-2 by FDA under an Emergency Use Authorization (EUA). This EUA will remain  in effect (meaning this test can be used) for the duration of the COVID-19 declaration under Section 564(b)(1) of the Act, 21 U.S.C. section 360bbb-3(b)(1), unless the authorization is terminated or revoked sooner.    Influenza A by PCR NEGATIVE NEGATIVE Final   Influenza B by PCR NEGATIVE NEGATIVE Final    Comment: (NOTE) The Xpert Xpress SARS-CoV-2/FLU/RSV assay is intended as an aid in  the diagnosis of influenza  from Nasopharyngeal swab specimens and  should not be used as a sole basis for treatment. Nasal washings and  aspirates are unacceptable for Xpert Xpress SARS-CoV-2/FLU/RSV  testing. Fact Sheet for Patients: PinkCheek.be Fact Sheet for Healthcare Providers: GravelBags.it This test is not yet approved or cleared by the [place] FDA and  has been authorized for detection and/or diagnosis of SARS-CoV-2 by  FDA under an Emergency Use Authorization (EUA). This EUA will remain  in effect (meaning this test can be used) for the duration of the  Covid-19 declaration under Section 564(b)(1) of the Act, 21  U.S.C. section 360bbb-3(b)(1), unless the  authorization is  terminated or revoked. Performed at [facility], [place] [address], [place], [place] [address]   SARS CORONAVIRUS 2 (TAT 6-24 HRS) Nasopharyngeal Nasopharyngeal Swab     Status: None   Collection Time: 05/28/19  3:38 PM   Specimen: Nasopharyngeal Swab  Result Value Ref Range Status   SARS Coronavirus 2 NEGATIVE NEGATIVE Final    Comment: (NOTE) SARS-CoV-2 target nucleic acids are NOT DETECTED. The SARS-CoV-2 RNA is generally detectable in upper and lower respiratory specimens during the acute phase of infection. Negative results do not preclude SARS-CoV-2 infection, do not rule out co-infections with other pathogens, and should not be used as the sole basis for treatment or other patient management decisions. Negative results must be combined with clinical observations, patient history, and epidemiological information. The expected result is Negative. Fact Sheet for Patients: SugarRoll.be Fact Sheet for Healthcare Providers: https://www.woods-mathews.com/ This test is not yet approved or cleared by the [place] FDA and  has been authorized for detection and/or diagnosis of SARS-CoV-2 by FDA under an  Emergency Use Authorization (EUA). This EUA will remain  in effect (meaning this test can be used) for the duration of the COVID-19 declaration under Section 56 4(b)(1) of the Act, 21 U.S.C. section 360bbb-3(b)(1), unless the authorization is terminated or revoked sooner. Performed at [facility], [place] [address] [place], [place] [address]          Radiology Studies: DG Chest 1 View  Result Date: 05/28/2019 CLINICAL DATA:  Recent falls with hip pain, initial encounter EXAM: CHEST  1 VIEW COMPARISON:  04/09/2019 FINDINGS: Cardiac shadow is enlarged but stable. Pacing device is again seen as well as postoperative change. The lungs are hypoinflated with patchy opacity throughout both lungs. No acute bony abnormality is seen. IMPRESSION: Patchy airspace opacities within both lungs increased when compared with the prior exam. This likely represents atypical/viral pneumonia. Electronically Signed   By: Inez Catalina M.D.   On: 05/28/2019 12:56   CT Head Wo Contrast  Result Date: 05/28/2019 CLINICAL DATA:  86 year old who fell twice last week, most recently 3 days ago, presenting with increasing lethargy and anorexia. Patient is currently anticoagulated with Eliquis due to atrial fibrillation. Initial encounter. EXAM: CT HEAD WITHOUT CONTRAST CT CERVICAL SPINE WITHOUT CONTRAST TECHNIQUE: Multidetector CT imaging of the head and cervical spine was performed following the standard protocol without intravenous contrast. Multiplanar CT image reconstructions of the cervical spine were also generated. COMPARISON:  CT head 07/26/2016 and earlier. CT cervical spine 10/13/2011. FINDINGS: CT HEAD FINDINGS Brain: Moderate to severe cortical atrophy, severe deep atrophy and mild to moderate cerebellar atrophy, unchanged. Moderate to severe changes of small vessel disease of the white matter diffusely, unchanged to somewhat progressive since 2018. Vascular: Severe BILATERAL carotid siphon and mild BILATERAL  vertebral artery atherosclerosis. No hyperdense vessel. Skull: No skull fracture or other focal osseous abnormality involving the skull. Sinuses/Orbits: Visualized paranasal sinuses, bilateral mastoid air cells and bilateral middle ear cavities well-aerated. Visualized orbits and globes normal in appearance. Other: None. CT CERVICAL SPINE FINDINGS Alignment: Straightening of the usual cervical lordosis. Facet joints anatomically aligned with diffuse degenerative changes. Skull base and vertebrae: No fractures identified involving the cervical spine. 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 Coronal reformatted images demonstrate an intact craniocervical junction, intact dens and intact lateral masses throughout. Soft tissues and spinal canal: No evidence of paraspinous or spinal canal hematoma. Moderate spinal stenosis at C4-5 and at C6-7 and  mild spinal stenosis at C5-6, all related to the POSTERIOR longitudinal ligament ossification. Disc levels: Severe degenerative changes at the C1-C2 articulation. Facet and uncinate hypertrophy account for multilevel foraminal stenoses including mild LEFT C2-3, moderate to severe LEFT C3-4, severe LEFT and mild RIGHT C4-5, mild LEFT C5-6, mild RIGHT C6-7. Upper chest: Approximate 3.0 x 2.3 cm nodule arising from the LOWER pole of the RIGHT lobe of the thyroid gland, unchanged since the 2013 CT and therefore benign. Focal hyperlucency in the RIGHT lung apex likely indicating localized air trapping. Other: Mild-to-moderate BILATERAL cervical carotid atherosclerosis. IMPRESSION: 1. No acute intracranial abnormality. 2. Stable moderate to severe generalized atrophy. 3. Moderate to severe chronic microvascular ischemic changes of the white matter diffusely. 4. No cervical spine fractures identified. 5. Multilevel degenerative changes and DISH involving the cervical spine with spinal stenosis at C4-5, C5-6 and C6-7 and multilevel foraminal  stenoses as detailed above. Electronically Signed   By: Evangeline Dakin M.D.   On: 05/28/2019 13:10   CT Cervical Spine Wo Contrast  Result Date: 05/28/2019 CLINICAL DATA:  86 year old who fell twice last week, most recently 3 days ago, presenting with increasing lethargy and anorexia. Patient is currently anticoagulated with Eliquis due to atrial fibrillation. Initial encounter. EXAM: CT HEAD WITHOUT CONTRAST CT CERVICAL SPINE WITHOUT CONTRAST TECHNIQUE: Multidetector CT imaging of the head and cervical spine was performed following the standard protocol without intravenous contrast. Multiplanar CT image reconstructions of the cervical spine were also generated. COMPARISON:  CT head 07/26/2016 and earlier. CT cervical spine 10/13/2011. FINDINGS: CT HEAD FINDINGS Brain: Moderate to severe cortical atrophy, severe deep atrophy and mild to moderate cerebellar atrophy, unchanged. Moderate to severe changes of small vessel disease of the white matter diffusely, unchanged to somewhat progressive since 2018. Vascular: Severe BILATERAL carotid siphon and mild BILATERAL vertebral artery atherosclerosis. No hyperdense vessel. Skull: No skull fracture or other focal osseous abnormality involving the skull. Sinuses/Orbits: Visualized paranasal sinuses, bilateral mastoid air cells and bilateral middle ear cavities well-aerated. Visualized orbits and globes normal in appearance. Other: None. CT CERVICAL SPINE FINDINGS Alignment: Straightening of the usual cervical lordosis. Facet joints anatomically aligned with diffuse degenerative changes. Skull base and vertebrae: No fractures identified involving the cervical spine. 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 Coronal reformatted images demonstrate an intact craniocervical junction, intact dens and intact lateral masses throughout. Soft tissues and spinal canal: No evidence of paraspinous or spinal canal hematoma. Moderate spinal  stenosis at C4-5 and at C6-7 and mild spinal stenosis at C5-6, all related to the POSTERIOR longitudinal ligament ossification. Disc levels: Severe degenerative changes at the C1-C2 articulation. Facet and uncinate hypertrophy account for multilevel foraminal stenoses including mild LEFT C2-3, moderate to severe LEFT C3-4, severe LEFT and mild RIGHT C4-5, mild LEFT C5-6, mild RIGHT C6-7. Upper chest: Approximate 3.0 x 2.3 cm nodule arising from the LOWER pole of the RIGHT lobe of the thyroid gland, unchanged since the 2013 CT and therefore benign. Focal hyperlucency in the RIGHT lung apex likely indicating localized air trapping. Other: Mild-to-moderate BILATERAL cervical carotid atherosclerosis. IMPRESSION: 1. No acute intracranial abnormality. 2. Stable moderate to severe generalized atrophy. 3. Moderate to severe chronic microvascular ischemic changes of the white matter diffusely. 4. No cervical spine fractures identified. 5. Multilevel degenerative changes and DISH involving the cervical spine with spinal stenosis at C4-5, C5-6 and C6-7 and multilevel foraminal stenoses as detailed above. Electronically Signed   By: Evangeline Dakin M.D.   On: 05/28/2019  13:10   CT HIP RIGHT WO CONTRAST  Result Date: 05/28/2019 CLINICAL DATA:  Increasing right hip pain since a fall 2 days ago. EXAM: CT OF THE RIGHT HIP WITHOUT CONTRAST TECHNIQUE: Multidetector CT imaging of the right hip was performed according to the standard protocol. Multiplanar CT image reconstructions were also generated. COMPARISON:  Radiographs dated 05/28/2019 FINDINGS: Bones/Joint/Cartilage There is no fracture or dislocation. Minimal degenerative changes of the superior aspect of the right acetabulum. No joint effusion. Muscles and Tendons Normal. Soft tissues There is a 11 x 5 x 2 cm hematoma in the subcutaneous fat just lateral to the right greater trochanter with adjacent subcutaneous soft tissue stranding consistent with bruising. IMPRESSION:  1. No acute osseous abnormality of the right hip. 2. Hematoma in the subcutaneous fat just lateral to the right greater trochanter. Electronically Signed   By: Lorriane Shire M.D.   On: 05/28/2019 15:16   DG HIP UNILAT WITH PELVIS 2-3 VIEWS RIGHT  Result Date: 05/28/2019 CLINICAL DATA:  Recent falls with right hip pain, initial encounter EXAM: DG HIP (WITH OR WITHOUT PELVIS) 2-3V RIGHT COMPARISON:  05/23/2019 FINDINGS: Pelvic ring is intact. Degenerative changes of the lumbar spine are seen. No acute fracture or dislocation is noted. Degenerative changes of the hip joints are noted. Prostate brachytherapy seeds are seen. IMPRESSION: No acute abnormality noted. Electronically Signed   By: Inez Catalina M.D.   On: 05/28/2019 12:54        Scheduled Meds: . atorvastatin  20 mg Oral Daily  . carbidopa-levodopa  1 tablet Oral TID  . insulin aspart  0-5 Units Subcutaneous QHS  . insulin aspart  0-9 Units Subcutaneous TID WC  . metoprolol tartrate  25 mg Oral BID  . QUEtiapine  12.5 mg Oral QHS  . vitamin B-12  1,000 mcg Oral Daily   Continuous Infusions: . sodium chloride 75 mL/hr at 05/29/19 1948  . ceFEPime (MAXIPIME) IV 2 g (05/30/19 2725)  . vancomycin       LOS: 2 days    Time spent: 25 minutes    Barb Merino, MD [facility] Pager [phone]

## 2019-05-30 NOTE — TOC Initial Note (Signed)
Transition of Care [facility]) - Initial/Assessment Note    Patient Details  Name: [name] Powell MRN: [id number] Date of Birth: [birth year]  Transition of Care [facility]) CM/SW Contact:    Dessa Phi, RN Phone Number: 05/30/2019, 2:54 PM  Clinical Narrative: spoke to spouse about d/c plans-return back to [facility]. Recc HHRN/PT/OT-[facility] already following. [facility] active for home 02. Transport @ d/c by [employer].                  Expected Discharge Plan: [place] Barriers to Discharge: Continued Medical Work up   Patient Goals and CMS Choice Patient states their goals for this hospitalization and ongoing recovery are:: go home CMS Medicare.gov Compare Post Acute Care list provided to:: Patient Represenative (must comment)(spouse-Betty) Choice offered to / list presented to : Spouse  Expected Discharge Plan and Services Expected Discharge Plan: [place]   Discharge Planning Services: CM Consult Post Acute Care Choice: [place] arrangements for the past 2 months: [place]                                      Prior Living Arrangements/Services Living arrangements for the past 2 months: [place] Lives with:: Self Patient language and need for interpreter reviewed:: No Do you feel safe going back to the place where you live?: Yes        Care giver support system in place?: Yes (comment) Current home services: DME, Homehealth aide, Other (comment), Home PT([facility];home 02-[facility],w/c,4 wheeled rw,3n1;private duty caregivers-24/7) Criminal Activity/Legal Involvement Pertinent to Current Situation/Hospitalization: No - Comment as needed  Activities of Daily Living Home Assistive Devices/Equipment: Eyeglasses, Grab bars around toilet, Grab bars in shower, Hand-held shower hose, Oxygen, Walker (specify type), Wheelchair, Other (Comment), Built-in shower seat, Cane (specify  quad or straight)(rollator, front wheeled walker, manual wheelchair, single point cane) ADL Screening (condition at time of admission) Patient's cognitive ability adequate to safely complete daily activities?: No Is the patient deaf or have difficulty hearing?: Yes(very hoh) Does the patient have difficulty seeing, even when wearing glasses/contacts?: No Does the patient have difficulty concentrating, remembering, or making decisions?: Yes Patient able to express need for assistance with ADLs?: No Does the patient have difficulty dressing or bathing?: Yes Independently performs ADLs?: No Communication: Independent Dressing (OT): Dependent Is this a change from baseline?: Change from baseline, expected to last >3 days Grooming: Dependent Is this a change from baseline?: Change from baseline, expected to last >3 days Feeding: Dependent Is this a change from baseline?: Change from baseline, expected to last >3 days Bathing: Dependent Is this a change from baseline?: Change from baseline, expected to last >3 days Toileting: Dependent Is this a change from baseline?: Change from baseline, expected to last >3days In/Out Bed: Dependent Is this a change from baseline?: Change from baseline, expected to last >3 days Walks in Home: Dependent Is this a change from baseline?: Change from baseline, expected to last >3 days Does the patient have difficulty walking or climbing stairs?: Yes(secondary to weakness) Weakness of Legs: Both Weakness of Arms/Hands: Both  Permission Sought/Granted Permission sought to share information with : Case Manager Permission granted to share information with : Yes, Verbal Permission Granted  Share Information with NAME: Case Manager Juliann Pulse  Permission granted to share info w AGENCY: [facility]  Permission granted to share info w Relationship: spouse [name] Powell  Permission granted to share info w Contact Information: [phone]  Emotional Assessment Appearance::  Appears stated age Attitude/Demeanor/Rapport: Gracious Affect (typically observed): Accepting Orientation: : Oriented to Self Alcohol / Substance Use: Not Applicable Psych Involvement: No (comment)  Admission diagnosis:  Fall [W19.XXXA] Failure to thrive (0-17) [R62.51] AKI (acute kidney injury) ([place]) [N17.9] HCAP (healthcare-associated pneumonia) [J18.9] HAP (hospital-acquired pneumonia) [J18.9, P95] Fall, initial encounter [W19.XXXA] Patient Active Problem List   Diagnosis Date Noted  . HCAP (healthcare-associated pneumonia) 05/28/2019  . AKI (acute kidney injury) ([place]) 05/28/2019  . Right hip pain 05/28/2019  . Macrocytic anemia 04/09/2019  . CAP (community acquired pneumonia) 02/23/2019  . Parkinson's disease ([place]) 02/22/2019  . Acute encephalopathy 02/22/2019  . Symptomatic anemia 10/23/2018  . Pacemaker battery depletion 02/02/2017  . CHF (congestive heart failure) ([place]) 07/27/2016  . Weakness generalized 07/27/2016  . Generalized weakness 07/27/2016  . Long term current use of anticoagulant 01/16/2016  . Acute respiratory failure with hypoxia ([place]) 09/15/2015  . Acute on chronic diastolic congestive heart failure ([place]) 09/15/2015  . CKD (chronic kidney disease), stage III ([place]) 09/15/2015  . Atrial fibrillation ([place])   . Pleural effusion, left   . Acute diastolic (congestive) heart failure ([place])   . Community acquired pneumonia 02/19/2015  . Atrial flutter ([place]) 01/20/2015  . New onset atrial flutter, persistent 07/04/2014  . Ventricular tachycardia (paroxysmal) ([place]) 03/28/2014  . Chronic diastolic congestive heart failure ([place]) 01/02/2014  . Pacemaker dependent - Medtronic 01/10/2013  . CAD s/p CABG 2003 01/10/2013  . Sinus arrest 01/10/2013  . CHB (complete heart block) ([place]) 01/10/2013  . DM2 (diabetes mellitus, type 2) ([place]) 01/10/2013  . Dyslipidemia 01/10/2013  . HTN (hypertension) 01/10/2013   PCP:  [name], MD Pharmacy:   [facility]  [place], [place] - [address] AT [address] [place] [address] Phone: [phone] Fax: [phone]     Social Determinants of Health (SDOH) Interventions    Readmission Risk Interventions Readmission Risk Prevention Plan 05/30/2019  Transportation Screening Complete  Medication Review ([place]) Complete  PCP or Specialist appointment within 3-5 days of discharge Complete  HRI or [place] Complete  SW Recovery Care/Counseling Consult Complete  [place] Patient Refused  Some recent data might be hidden

## 2019-05-31 DIAGNOSIS — Z7189 Other specified counseling: Secondary | ICD-10-CM

## 2019-05-31 DIAGNOSIS — Z515 Encounter for palliative care: Secondary | ICD-10-CM

## 2019-05-31 DIAGNOSIS — R1310 Dysphagia, unspecified: Secondary | ICD-10-CM

## 2019-05-31 LAB — BPAM RBC
Blood Product Expiration Date: 202102112359
Blood Product Expiration Date: 202102152359
Blood Product Expiration Date: 202102152359
ISSUE DATE / TIME: 202101181928
ISSUE DATE / TIME: 202101200627
ISSUE DATE / TIME: 202101200931
Unit Type and Rh: 9500
Unit Type and Rh: 9500
Unit Type and Rh: 9500

## 2019-05-31 LAB — CBC
HCT: 28.4 % — ABNORMAL LOW (ref 39.0–52.0)
Hemoglobin: 8.7 g/dL — ABNORMAL LOW (ref 13.0–17.0)
MCH: 31.5 pg (ref 26.0–34.0)
MCHC: 30.6 g/dL (ref 30.0–36.0)
MCV: 102.9 fL — ABNORMAL HIGH (ref 80.0–100.0)
Platelets: 179 10*3/uL (ref 150–400)
RBC: 2.76 MIL/uL — ABNORMAL LOW (ref 4.22–5.81)
RDW: 22.6 % — ABNORMAL HIGH (ref 11.5–15.5)
WBC: 5.9 10*3/uL (ref 4.0–10.5)
nRBC: 0.3 % — ABNORMAL HIGH (ref 0.0–0.2)

## 2019-05-31 LAB — BASIC METABOLIC PANEL
Anion gap: 9 (ref 5–15)
BUN: 52 mg/dL — ABNORMAL HIGH (ref 8–23)
CO2: 23 mmol/L (ref 22–32)
Calcium: 8.5 mg/dL — ABNORMAL LOW (ref 8.9–10.3)
Chloride: 110 mmol/L (ref 98–111)
Creatinine, Ser: 1.9 mg/dL — ABNORMAL HIGH (ref 0.61–1.24)
GFR calc Af Amer: 34 mL/min — ABNORMAL LOW (ref >60)
GFR calc non Af Amer: 30 mL/min — ABNORMAL LOW (ref >60)
Glucose, Bld: 124 mg/dL — ABNORMAL HIGH (ref 70–99)
Potassium: 4 mmol/L (ref 3.5–5.1)
Sodium: 142 mmol/L (ref 135–145)

## 2019-05-31 LAB — TYPE AND SCREEN
ABO/RH(D): O NEG
Antibody Screen: NEGATIVE
Unit division: 0
Unit division: 0
Unit division: 0

## 2019-05-31 LAB — GLUCOSE, CAPILLARY
Glucose-Capillary: 117 mg/dL — ABNORMAL HIGH (ref 70–99)
Glucose-Capillary: 152 mg/dL — ABNORMAL HIGH (ref 70–99)
Glucose-Capillary: 131 mg/dL — ABNORMAL HIGH (ref 70–99)
Glucose-Capillary: 161 mg/dL — ABNORMAL HIGH (ref 70–99)

## 2019-05-31 NOTE — Progress Notes (Signed)
SLP Cancellation Note  Patient Details Name: [name] Powell MRN: [id number] DOB: 11-20-1922   Cancelled treatment:       Reason Eval/Treat Not Completed: Fatigue/lethargy limiting ability to participate(SLP visit to assess pt po tolerance with compensataion strategies.  Pt declined to wake up stating he is so "tired", will continue efforts.  Note Md spoke to pt's wife re: dysphagia.  Thanks.)   Macario Golds 05/31/2019, 10:08 AM  Kathleen Lime, MS [place] Office [phone]

## 2019-05-31 NOTE — Progress Notes (Signed)
  Speech Language Pathology Treatment: Dysphagia  Patient Details Name: [name] Powell MRN: [id number] DOB: Apr 29, 1923 Today's Date: 05/31/2019 Time: 8003-4917 SLP Time Calculation (min) (ACUTE ONLY): 14 min  Assessment / Plan / Recommendation Clinical Impression  Second session with pt as he was finished with breakfast and SLP provided him with further detailed information re: his dysphgia, MBS and reasoning for compensations strategies.  He initially needed max cues to re=state but this waned to min during session.  SLP typed most important compensation strategy in very large print and pt was able to verbalize and teach back reasoning.    Reviewed Ph neutral status of water and solid aspiration being more precarious than liquids.  Pt reports he has seen an SLP in the past for swallowing but he was given too many exercises. SLP will initiate RMST while pt here to maximize his cough/submental musculature to help with airway protection with eating drinking.   Pt agreeable to plan and states "I Know you will teach me more about my swallowing" indicative of desire to learn and willingness to try to imrpove swallowing ability to decrease pna risk and maximize nutrition.    HPI HPI: 86 yo male adm to [facility] with FTT, AMS, diagnosed with HCAP with right hip pain and falls.  Pt found to have a large hematoma on right hip.  PMH + for Parkinsons, pna 02/2015, CAD, C4-C7 DISH and he admits to some difficulties swallowing.  Pt is a FULL code and he states he "wants them to decide that" when SLP inquired (SLP asked due to concern for level of dysphagia).  Pt CT showed moderate atrophy.  Pt is on 2 liters oxygen and is receiving a blood transfusion.  CXR 1/18 showed bilateral patchy opacities incr since prior exam and consistent with atypical/viral pna per radiologist report.  Pt underwent MBS yesterday and today follow up indicated to assess po tolerance and for pt education.      SLP Plan  Continue with current  plan of care       Recommendations  Diet recommendations: Dysphagia 2 (fine chop);Thin liquid Liquids provided via: Cup;Straw Medication Administration: Crushed with puree Supervision: Patient able to self feed Compensations: Slow rate;Small sips/bites;Multiple dry swallows after each bite/sip Postural Changes and/or Swallow Maneuvers: Seated upright 90 degrees;Upright 30-60 min after meal                Oral Care Recommendations: Oral care QID Follow up Recommendations: Home health SLP SLP Visit Diagnosis: Dysphagia, oropharyngeal phase (R13.12) Plan: Continue with current plan of care       GO                Macario Golds 05/31/2019, 11:43 AM  Kathleen Lime, MS [place] Office [phone]

## 2019-05-31 NOTE — Consult Note (Signed)
   [facility] CM Inpatient Consult   05/31/2019  [name] Powell 11-06-1922 [id number]    Patientscreened for 30% extreme high risk score for unplanned readmission, 3 hospitalizations and 2 ED visits in the past 6 months; and to check for potential [facility]) care management services under his [facility] Medicare benefits.  Patient had been outreached by [facility] telephonic care management coordinator for EMMI follow-up calls in the past.  Chart review andMD brief narrative show as follows: Admitted with right hip hematoma, bilateral lower lobe pneumonia.  This 86 year old male with history of A. fib on anticoagulation, hypertension, history of TIA, type 2 diabetes, dyslipidemia, coronary artery disease, Parkinson's dementia from [facility] facility brought to the emergency room with 2 falls and have been having increasing lethargy and not eating well, complaining of right hip pain.  Pt lives in [place] with his wife.    PTevaluation completed recommending skilled PT to increase independence and safety with mobility.  Call placed and spoke with patient's wife Inez Catalina). HIPAA verified. She reports that husband is not appropriately answering questions at times related to dementia. [facility]) care management services available for patient. Wife states no current issues or needs with medications (wife managing), pharmacy (uses [facility] at [address] [place]); transportation (provided by facility aide). According to wife, patient has aides (CNA) to assist with needs 24/7 and will have home health services with [facility]. She endorses patient's primary care provider as Dr. [name] with [facility]; office is listed to provide transition of care follow-up. Wife was made aware of need for patient to follow-up with primary care provider post hospitalization and to notify provider for any health  needs/ issues that may arise.  Wife verbalized that she does not believe needing [place] Management services for patient at this point, and was thankful for the information provided.  Transition of care CM note shows that discharge plan is to return back to [facility] facility with home health care services via [place] RN/PT/OT/ST.  Ifthere are anydisposition changes,and needs for appropriate community follow-up,please referto [facility] care management.   For questions and additional information, please call:  Panagiota Perfetti A. Lashana Spang, BSN, RN-BC [facility] Liaison Cell: [phone]

## 2019-05-31 NOTE — Progress Notes (Signed)
  Speech Language Pathology Treatment: Dysphagia  Patient Details Name: [name] Powell MRN: [id number] DOB: 07/05/1922 Today's Date: 05/31/2019 Time: 1015-1050 SLP Time Calculation (min) (ACUTE ONLY): 35 min  Assessment / Plan / Recommendation Clinical Impression  Pt benefited from moderate verbal/visual cues to cough/hock when reflexively coughing,throat clearing or voice is wet/gurgly.  When cued he was able to expectorate minimal secretions on occasion with food or liquid consumed. Throughout entire breakfast and taking po medications with nurse, pt coughed x3 and cleared his throat x2 - of approximately 20 swallows.  He demonstrates decreased awareness to his wet voice and benefits from increased cueing to clear. Pt eats slowly suspect due to his Parkinsons and dysphagia and would thus benefit from intermittent supervision to allow him adequate time.  Using teach back, pt educated to recommendations.  Will continue to follow and recommend [facility] SLP for RMST for dysphagia.   HPI HPI: 86 yo male adm to [facility] with FTT, AMS, diagnosed with HCAP with right hip pain and falls.  Pt found to have a large hematoma on right hip.  PMH + for Parkinsons, pna 02/2015, CAD, C4-C7 DISH and he admits to some difficulties swallowing.  Pt is a FULL code and he states he "wants them to decide that" when SLP inquired (SLP asked due to concern for level of dysphagia).  Pt CT showed moderate atrophy.  Pt is on 2 liters oxygen and is receiving a blood transfusion.  CXR 1/18 showed bilateral patchy opacities incr since prior exam and consistent with atypical/viral pna per radiologist report.  Pt underwent MBS yesterday and today follow up indicated to assess po tolerance and for pt education.      SLP Plan  Continue with current plan of care       Recommendations  Diet recommendations: Dysphagia 2 (fine chop);Thin liquid Liquids provided via: Cup;Straw Medication Administration: Crushed with puree Supervision:  Patient able to self feed Compensations: Slow rate;Small sips/bites;Multiple dry swallows after each bite/sip Postural Changes and/or Swallow Maneuvers: Seated upright 90 degrees;Upright 30-60 min after meal                Oral Care Recommendations: Oral care QID Follow up Recommendations: Home health SLP SLP Visit Diagnosis: Dysphagia, oropharyngeal phase (R13.12) Plan: Continue with current plan of care       GO                Macario Golds 05/31/2019, 11:36 AM   Kathleen Lime, MS [place] Service Office [phone]

## 2019-05-31 NOTE — TOC Progression Note (Signed)
Transition of Care [facility]) - Progression Note    Patient Details  Name: [name] Powell MRN: [id number] Date of Birth: 06-Jun-1922  [facility]) CM/SW Contact  Tabius Rood, Juliann Pulse, RN Phone Number: 05/31/2019, 11:33 AM  Clinical Narrative:  Per spouse d/c plan-return back to [facility] w/[facility] [place] HHRN/PT/OT/ST-will fax [place] orders to fax#[phone].Has home 02-[facility]. [facility]AR for transport back.     Expected Discharge Plan: [place] Barriers to Discharge: Continued Medical Work up  Expected Discharge Plan and Services Expected Discharge Plan: [place]   Discharge Planning Services: CM Consult Post Acute Care Choice: [place] arrangements for the past 2 months: [place]                           HH Arranged: RN, PT, OT, Speech Therapy HH Agency: [facility] Date [place]: 05/31/19 Time [place]: [id number] Representative spoke with at [place]: [place] ([place]) Interventions    Readmission Risk Interventions Readmission Risk Prevention Plan 05/30/2019  Transportation Screening Complete  Medication Review [employer]) Complete  PCP or Specialist appointment within 3-5 days of discharge Complete  HRI or [place] Complete  SW Recovery Care/Counseling Consult Complete  [place] Patient Refused  Some recent data might be hidden

## 2019-05-31 NOTE — Progress Notes (Signed)
PROGRESS NOTE    [name] Powell  FBP:[id number] DOB: 02-Apr-1923 DOA: 05/28/2019 PCP: [name], MD    Brief Narrative:  From admitting provider, 86 year old male with history of A. fib on anticoagulation, hypertension, history of TIA, type 2 diabetes, dyslipidemia, coronary artery disease, Parkinson's dementia from [facility] brought to the emergency room with 2 falls and have been having increasing lethargic and not eating well. Patient was complaining of right hip pain on presentation. No fever. COVID-19 negative. Admitted with right hip hematoma, bilateral lower lobe pneumonia.   Assessment & Plan:   Principal Problem:   Acute respiratory failure with hypoxia (HCC) Active Problems:   Pacemaker dependent - Medtronic   CAD s/p CABG 2003   DM2 (diabetes mellitus, type 2) (HCC)   HTN (hypertension)   Chronic diastolic congestive heart failure (HCC)   Atrial fibrillation (HCC)   Weakness generalized   Acute encephalopathy   HCAP (healthcare-associated pneumonia)   AKI (acute kidney injury) ([place])   Right hip pain  Bilateral lower lobe pneumonia with hypoxia: Suspected acute on chronic aspiration.  Treated with vancomycin and cefepime.  Discontinue vancomycin.  Continue cefepime.   Cultures are negative so far. We will continue antibiotics today. Seen by speech therapy, underwent modified barium swallow and currently remains on dysphagia 2 diet.   Chest physiotherapy, incentive spirometry, deep breathing exercises, sputum induction, mucolytic's and bronchodilators. Supplemental oxygen to keep saturations more than 90%.  Does have oxygen at home.  Acute blood loss anemia: Acute on chronic anemia. Due to hematoma. 1 unit PRBC on admission 2 units PRBC 1/20.  Hemoglobin 8.5.  Recheck in the morning to ensure stabilization.  X-ray negative for fracture or dislocation. CT scan of the hip showed 10 x 5 cm hematoma, stable on clinical examination.  working with PT OT.  AKI on CKD stage III: Baseline creatinine 1.8-1.9. Presented creatinine 2.9. Poor oral intake. Lasix on hold. Continue gentle hydration.  Improved.  CABG, CAD: Pacemaker dependent. Stable. Holding Eliquis.  Will discontinue.  Side effects outweigh benefits.  Type 2 diabetes: On Januvia at home. On SSI in the hospital.  Permanent A. fib: On beta-blockers. Rate controlled. Will discontinue Eliquis altogether.  Advanced debility, Parkinson's with dementia and dysphagia: Patient with aspiration and advanced debility. Continue to work with speech, PT OT.  Seen by palliative.  Appreciate input.  He will benefit with community palliative follow-up.    DVT prophylaxis: SCDs Code Status: Full code.  Will recommend community palliative follow-up after discharge. Family Communication: Patient's wife on the phone. Disposition Plan: Back home tomorrow.   Consultants:   Palliative medicine   Procedures:   None  Antimicrobials:  Anti-infectives (From admission, onward)   Start     Dose/Rate Route Frequency Ordered Stop   05/30/19 1400  vancomycin (VANCOREADY) IVPB 750 mg/150 mL  Status:  Discontinued     750 mg 150 mL/hr over 60 Minutes Intravenous Every 48 hours 05/28/19 1729 05/30/19 1051   05/29/19 0600  ceFEPIme (MAXIPIME) 2 g in sodium chloride 0.9 % 100 mL IVPB     2 g 200 mL/hr over 30 Minutes Intravenous Every 24 hours 05/28/19 1845     05/28/19 2200  aztreonam (AZACTAM) 1 g in sodium chloride 0.9 % 100 mL IVPB  Status:  Discontinued     1 g 200 mL/hr over 30 Minutes Intravenous Every 8 hours 05/28/19 1728 05/28/19 1844   05/28/19 1345  ceFEPIme (MAXIPIME) 1 g in sodium chloride 0.9 %  100 mL IVPB     1 g 200 mL/hr over 30 Minutes Intravenous  Once 05/28/19 1333 05/28/19 1437   05/28/19 1345  vancomycin (VANCOCIN) IVPB 1000 mg/200 mL premix     1,000 mg 200 mL/hr over 60 Minutes Intravenous  Once 05/28/19 1341 05/28/19 1753         Subjective: Patient  seen and examined.  Feels tired.  No other overnight events.  On 2 L oxygen at home.  Currently on 4 L in the hospital. Objective: Vitals:   05/30/19 2058 05/31/19 0542 05/31/19 0826 05/31/19 1015  BP: (!) 166/71 (!) 153/65    Pulse: 61 (!) 59  64  Resp: 19 18    Temp:  98 F (36.7 C)    TempSrc:  Oral    SpO2: 93% (!) 86% 92%   Weight:      Height:        Intake/Output Summary (Last 24 hours) at 05/31/2019 1100 Last data filed at 05/31/2019 0600 Gross per 24 hour  Intake 826.76 ml  Output 1050 ml  Net -223.24 ml   Filed Weights   05/30/19 0832  Weight: 76.1 kg    Examination:  General exam: Calm, comfortable, on 4 L oxygen.   Respiratory system: Clear to auscultation. Respiratory effort normal. No added sounds. Cardiovascular system: S1 & S2 heard, irregularly irregular. Pacemaker present. No JVD, murmurs, rubs, gallops or clicks. No pedal edema. Gastrointestinal system: Abdomen is nondistended, soft and nontender. No organomegaly or masses felt. Normal bowel sounds heard. Central nervous system: Alert and oriented x 1-2. No focal neurological deficits. Generalized weakness. Extremities: Symmetric 5 x 5 power. Generalized weakness. Skin: No rashes, lesions or ulcers Psychiatry: Judgement and insight appear normal. Mood & affect flat. Ecchymosis right lateral thigh, firm non tender hematoma about 8 x 5 cm in size.  Distal neurovascular status intact.    Data Reviewed: I have personally reviewed following labs and imaging studies  CBC: Recent Labs  Lab 05/28/19 1111 05/29/19 0515 05/30/19 0335 05/31/19 0404  WBC 11.4* 13.4* 8.7 5.9  NEUTROABS 6.6  --   --   --   HGB 7.0* 8.1* 6.7* 8.7*  HCT 23.3* 26.1* 22.3* 28.4*  MCV 113.7* 106.5* 108.8* 102.9*  PLT 193 193 168 854   Basic Metabolic Panel: Recent Labs  Lab 05/28/19 1111 05/29/19 0515 05/30/19 0335 05/31/19 0404  NA 138 139 141 142  K 5.4* 5.0 4.4 4.0  CL 104 105 110 110  CO2 25 24 24 23   GLUCOSE 188*  197* 170* 124*  BUN 59* 56* 57* 52*  CREATININE 2.97* 2.55* 2.24* 1.90*  CALCIUM 8.4* 8.8* 8.4* 8.5*   GFR: Estimated Creatinine Clearance: 23 mL/min (A) (by C-G formula based on SCr of 1.9 mg/dL (H)). Liver Function Tests: Recent Labs  Lab 05/28/19 1111  AST 15  ALT 7  ALKPHOS 107  BILITOT 0.5  PROT 7.0  ALBUMIN 3.1*   No results for input(s): LIPASE, AMYLASE in the last 168 hours. No results for input(s): AMMONIA in the last 168 hours. Coagulation Profile: Recent Labs  Lab 05/28/19 1111  INR 1.5*   Cardiac Enzymes: No results for input(s): CKTOTAL, CKMB, CKMBINDEX, TROPONINI in the last 168 hours. BNP (last 3 results) No results for input(s): PROBNP in the last 8760 hours. HbA1C: Recent Labs    05/29/19 0515  HGBA1C 6.6*   CBG: Recent Labs  Lab 05/30/19 0738 05/30/19 1158 05/30/19 1653 05/30/19 2100 05/31/19 0749  GLUCAP 132* 136*  138* 170* 117*   Lipid Profile: No results for input(s): CHOL, HDL, LDLCALC, TRIG, CHOLHDL, LDLDIRECT in the last 72 hours. Thyroid Function Tests: No results for input(s): TSH, T4TOTAL, FREET4, T3FREE, THYROIDAB in the last 72 hours. Anemia Panel: Recent Labs    05/28/19 1111 05/28/19 1538  VITAMINB12  --  746  FOLATE  --  12.5  FERRITIN  --  120  TIBC  --  305  IRON  --  17*  RETICCTPCT 2.8  --    Sepsis Labs: Recent Labs  Lab 05/28/19 1111 05/28/19 1538  PROCALCITON <0.10  --   LATICACIDVEN  --  2.5*    Recent Results (from the past 240 hour(s))  Urine culture     Status: Abnormal   Collection Time: 05/28/19 12:51 PM   Specimen: Urine, Random  Result Value Ref Range Status   Specimen Description   Final    URINE, RANDOM Performed at [facility], [place] [address] [place], [place] [address]    Special Requests   Final    NONE Performed at [facility], [place] [address] [place], [place] [address]    Culture (A)  Final    <10,000 COLONIES/mL INSIGNIFICANT GROWTH Performed at [facility] [address], [place], [place] [address]    Report Status 05/29/2019 FINAL  Final  Respiratory Panel by RT PCR (Flu A&B, Covid) - Nasopharyngeal Swab     Status: None   Collection Time: 05/28/19  1:44 PM   Specimen: Nasopharyngeal Swab  Result Value Ref Range Status   SARS Coronavirus 2 by RT PCR NEGATIVE NEGATIVE Final    Comment: (NOTE) SARS-CoV-2 target nucleic acids are NOT DETECTED. The SARS-CoV-2 RNA is generally detectable in upper respiratoy specimens during the acute phase of infection. The lowest concentration of SARS-CoV-2 viral copies this assay can detect is 131 copies/mL. A negative result does not preclude SARS-Cov-2 infection and should not be used as the sole basis for treatment or other patient management decisions. A negative result may occur with  improper specimen collection/handling, submission of specimen other than nasopharyngeal swab, presence of viral mutation(s) within the areas targeted by this assay, and inadequate number of viral copies (<131 copies/mL). A negative result must be combined with clinical observations, patient history, and epidemiological information. The expected result is Negative. Fact Sheet for Patients:  PinkCheek.be Fact Sheet for Healthcare Providers:  GravelBags.it This test is not yet ap proved or cleared by the [place] FDA and  has been authorized for detection and/or diagnosis of SARS-CoV-2 by FDA under an Emergency Use Authorization (EUA). This EUA will remain  in effect (meaning this test can be used) for the duration of the COVID-19 declaration under Section 564(b)(1) of the Act, 21 U.S.C. section 360bbb-3(b)(1), unless the authorization is terminated or revoked sooner.    Influenza A by PCR NEGATIVE NEGATIVE Final   Influenza B by PCR NEGATIVE NEGATIVE Final    Comment: (NOTE) The Xpert Xpress SARS-CoV-2/FLU/RSV assay is intended as an aid in    the diagnosis of influenza from Nasopharyngeal swab specimens and  should not be used as a sole basis for treatment. Nasal washings and  aspirates are unacceptable for Xpert Xpress SARS-CoV-2/FLU/RSV  testing. Fact Sheet for Patients: PinkCheek.be Fact Sheet for Healthcare Providers: GravelBags.it This test is not yet approved or cleared by the [place] FDA and  has been authorized for detection and/or diagnosis of SARS-CoV-2 by  FDA under an Emergency Use  Authorization (EUA). This EUA will remain  in effect (meaning this test can be used) for the duration of the  Covid-19 declaration under Section 564(b)(1) of the Act, 21  U.S.C. section 360bbb-3(b)(1), unless the authorization is  terminated or revoked. Performed at [facility], [place] [address], [place], [place] [address]   SARS CORONAVIRUS 2 (TAT 6-24 HRS) Nasopharyngeal Nasopharyngeal Swab     Status: None   Collection Time: 05/28/19  3:38 PM   Specimen: Nasopharyngeal Swab  Result Value Ref Range Status   SARS Coronavirus 2 NEGATIVE NEGATIVE Final    Comment: (NOTE) SARS-CoV-2 target nucleic acids are NOT DETECTED. The SARS-CoV-2 RNA is generally detectable in upper and lower respiratory specimens during the acute phase of infection. Negative results do not preclude SARS-CoV-2 infection, do not rule out co-infections with other pathogens, and should not be used as the sole basis for treatment or other patient management decisions. Negative results must be combined with clinical observations, patient history, and epidemiological information. The expected result is Negative. Fact Sheet for Patients: SugarRoll.be Fact Sheet for Healthcare Providers: https://www.woods-mathews.com/ This test is not yet approved or cleared by the [place] FDA and  has been authorized for detection and/or diagnosis of  SARS-CoV-2 by FDA under an Emergency Use Authorization (EUA). This EUA will remain  in effect (meaning this test can be used) for the duration of the COVID-19 declaration under Section 56 4(b)(1) of the Act, 21 U.S.C. section 360bbb-3(b)(1), unless the authorization is terminated or revoked sooner. Performed at [facility], [place] [address], [place], [place] [address]   MRSA PCR Screening     Status: None   Collection Time: 05/30/19  8:12 AM   Specimen: Nasopharyngeal  Result Value Ref Range Status   MRSA by PCR NEGATIVE NEGATIVE Final    Comment:        The GeneXpert MRSA Assay (FDA approved for NASAL specimens only), is one component of a comprehensive MRSA colonization surveillance program. It is not intended to diagnose MRSA infection nor to guide or monitor treatment for MRSA infections. Performed at [facility], [place] [address], [place], [place] [address]          Radiology Studies: DG Swallowing Func-Speech Pathology  Result Date: 05/30/2019 Objective Swallowing Evaluation: Type of Study: Bedside Swallow Evaluation  Patient Details Name: DARTANIAN KNAGGS MRN: [id number] Date of Birth: 09/25/1922 Today's Date: 05/30/2019 Time: SLP Start Time (ACUTE ONLY): 1535 -SLP Stop Time (ACUTE ONLY): 1615 SLP Time Calculation (min) (ACUTE ONLY): 40 min Past Medical History: Past Medical History: Diagnosis Date . Anemia   chronic . Atrial fibrillation ([place])  . Cancer ([place])  . CHB (complete heart block) ([place])  . Coronary artery disease  . Diabetes mellitus  . Dyslipidemia  . H/O prostate cancer  . Hypertension  . New onset atrial flutter, persistent 07/04/2014 . Pacemaker 12/12/2006  Medtronic adapta . Pleural effusion, left  . Pneumonia 09/2015 . Prostate cancer ([place])  . S/P CABG x 4 09/04/2001  LIMA to LAD,SVG to diagonal,SVG to ramus intermedius,SVG to PDA . Ventricular tachycardia (paroxysmal) ([place]) 03/28/2014 Past Surgical History: Past Surgical History: Procedure  Laterality Date . CORONARY ARTERY BYPASS GRAFT  09/04/2001  LIMA to LAD,SVG to diagonal,SVG to ramus intermedius,SVG to PDA . NM MYOVIEW LTD  01/09/2010  no ischemia . PACEMAKER INSERTION  12/12/2006  Medtronic adapta . PPM GENERATOR CHANGEOUT N/A 02/02/2017  Procedure: PPM GENERATOR CHANGEOUT - DUAL CHAMBER;  Surgeon: [name], MD;  Location: [facility];  Service: Cardiovascular;  Laterality: N/A; . PROSTATE SURGERY  2001  cancer . TONSILLECTOMY   HPI: 86 yo male adm to [facility] with FTT, AMS, diagnosed with HCAP with right hip pain and falls.  Pt found to have a large hematoma on right hip.  PMH + for Parkinsons, pna 02/2015, CAD, C4-C7 DISH and he admits to some difficulties swallowing.  Pt is a FULL code and he states he "wants them to decide that" when SLP inquired (SLP asked due to concern for level of dysphagia).  Pt CT showed moderate atrophy.  Pt is on 2 liters oxygen and is receiving a blood transfusion.  CXR 1/18 showed bilateral patchy opacities incr since prior exam and consistent with atypical/viral pna per radiologist report.  Subjective: pt in bed, eyes closed but responds to slp with delay Assessment / Plan / Recommendation CHL IP CLINICAL IMPRESSIONS 05/30/2019 Clinical Impression Pt presents with moderately severe oropharyngeal sensorimotor dysphagia likely due to his Parkinson's disease with some component of obstructive characteristics due to DISH.  Lingual pumping, decreased propulsion observed across consistencies.  Pharyngeal swallow c/b decreased laryngeal elevation/closure and compromised tongue base retraction.  Above characteristics result in pharyngeal retention with all boluses and laryngeal penetration/minimal aspiration of liquids.  Pharyngeal secretion retention also noted which mixed with barium and was also mildly aspirated.  Various postures including minimal head turn left (*pt with very limited neck ROM-and only to the left approx 30*) with minimal chin tuck posture did not  improve pharyngeal clearance/prevent pharyngeal retention.  Dry swallows and liquid swallows helpful to decrease pharyngeal retention.  Pt only senses large amounts of pharyngeal retention.    Cued cough/"Hock" was most helpful to expel 90% of retained barium from pharynx/vallecular region and cleared minimal upper larynx/trachea aspiration and this has likely helped pt with airway protection.   Pt did not clear aspirates that spilled deeper into trachea with cued cough but he did only mildly aspirate even when taxed.  No reflexive cough produced during entire MBS with swallowing barium (but observed swallowing liquids).   Given pt's propensity to cough with po and combined with results of MBS, certainly pt experiences aspiration for which he has managed.  Pt's DISH narrows pharynx and contributes to his dysphagia also.   SLP expressed gratitude to pt for participating in [facility] and using teach back educated him to findings.  Recommend to initiate swallow precautions/strategies to mitigate risk.  Continue to recommend consideration for [place] meeting given pt's recurrent admission, FTT, falls and progressive neurological disease.  Will follow to provide pt/family with education and initiate exercises to maximize swallow function. SLP Visit Diagnosis Dysphagia, oropharyngeal phase (R13.12) Attention and concentration deficit following -- Frontal lobe and executive function deficit following -- Impact on safety and function Risk for inadequate nutrition/hydration;Moderate aspiration risk   CHL IP TREATMENT RECOMMENDATION 05/30/2019 Treatment Recommendations Therapy as outlined in treatment plan below   Prognosis 05/30/2019 Prognosis for Safe Diet Advancement Fair Barriers to Reach Goals Time post onset Barriers/Prognosis Comment -- CHL IP DIET RECOMMENDATION 05/30/2019 SLP Diet Recommendations Thin liquid;Dysphagia 2 (Fine chop) solids Liquid Administration via -- Medication Administration Crushed with puree Compensations Slow  rate;Small sips/bites;Multiple dry swallows after each bite/sip;Follow solids with liquid Postural Changes Seated upright at 90 degrees;Remain semi-upright after after feeds/meals (Comment)   CHL IP OTHER RECOMMENDATIONS 05/30/2019 Recommended Consults -- Oral Care Recommendations Oral care QID Other Recommendations Have oral suction available   CHL IP FOLLOW UP RECOMMENDATIONS 05/30/2019 Follow  up Recommendations Home health SLP   CHL IP FREQUENCY AND DURATION 05/30/2019 Speech Therapy Frequency (ACUTE ONLY) min 2x/week Treatment Duration 2 weeks      CHL IP ORAL PHASE 05/30/2019 Oral Phase Impaired Oral - Pudding Teaspoon -- Oral - Pudding Cup -- Oral - Honey Teaspoon -- Oral - Honey Cup -- Oral - Nectar Teaspoon Weak lingual manipulation;Reduced posterior propulsion;Decreased bolus cohesion Oral - Nectar Cup Weak lingual manipulation;Reduced posterior propulsion;Decreased bolus cohesion Oral - Nectar Straw -- Oral - Thin Teaspoon Weak lingual manipulation;Reduced posterior propulsion;Decreased bolus cohesion Oral - Thin Cup Weak lingual manipulation;Reduced posterior propulsion;Decreased bolus cohesion Oral - Thin Straw Weak lingual manipulation;Reduced posterior propulsion;Decreased bolus cohesion Oral - Puree Lingual pumping;Weak lingual manipulation;Reduced posterior propulsion;Decreased bolus cohesion Oral - Mech Soft Weak lingual manipulation;Lingual pumping;Impaired mastication;Reduced posterior propulsion;Decreased bolus cohesion Oral - Regular -- Oral - Multi-Consistency -- Oral - Pill -- Oral Phase - Comment --  CHL IP PHARYNGEAL PHASE 05/30/2019 Pharyngeal Phase Impaired Pharyngeal- Pudding Teaspoon -- Pharyngeal -- Pharyngeal- Pudding Cup -- Pharyngeal -- Pharyngeal- Honey Teaspoon -- Pharyngeal -- Pharyngeal- Honey Cup -- Pharyngeal -- Pharyngeal- Nectar Teaspoon Reduced epiglottic inversion;Reduced tongue base retraction;Reduced laryngeal elevation;Pharyngeal residue - valleculae;Pharyngeal residue -  pyriform;Reduced anterior laryngeal mobility Pharyngeal Material does not enter airway Pharyngeal- Nectar Cup Reduced epiglottic inversion;Reduced laryngeal elevation;Reduced airway/laryngeal closure;Pharyngeal residue - valleculae;Pharyngeal residue - pyriform;Reduced anterior laryngeal mobility Pharyngeal Material enters airway, remains ABOVE vocal cords and not ejected out Pharyngeal- Nectar Straw -- Pharyngeal -- Pharyngeal- Thin Teaspoon Reduced epiglottic inversion;Reduced laryngeal elevation;Reduced airway/laryngeal closure;Pharyngeal residue - valleculae;Pharyngeal residue - pyriform;Reduced tongue base retraction;Reduced anterior laryngeal mobility Pharyngeal Material enters airway, CONTACTS cords and not ejected out Pharyngeal- Thin Cup Pharyngeal residue - valleculae;Reduced epiglottic inversion;Pharyngeal residue - pyriform;Reduced airway/laryngeal closure;Reduced tongue base retraction;Reduced laryngeal elevation;Reduced anterior laryngeal mobility Pharyngeal Material enters airway, passes BELOW cords without attempt by patient to eject out (silent aspiration);Material enters airway, passes BELOW cords then ejected out Pharyngeal- Thin Straw Reduced tongue base retraction;Reduced airway/laryngeal closure;Reduced epiglottic inversion;Pharyngeal residue - valleculae;Pharyngeal residue - pyriform;Reduced anterior laryngeal mobility;Reduced laryngeal elevation Pharyngeal Material enters airway, passes BELOW cords without attempt by patient to eject out (silent aspiration);Material enters airway, passes BELOW cords then ejected out Pharyngeal- Puree Reduced epiglottic inversion;Reduced tongue base retraction;Pharyngeal residue - valleculae;Pharyngeal residue - pyriform;Reduced laryngeal elevation;Reduced airway/laryngeal closure;Reduced anterior laryngeal mobility Pharyngeal Material does not enter airway Pharyngeal- Mechanical Soft Reduced epiglottic inversion;Reduced laryngeal elevation;Reduced  airway/laryngeal closure;Reduced tongue base retraction;Reduced anterior laryngeal mobility;Pharyngeal residue - valleculae;Pharyngeal residue - pyriform Pharyngeal Material does not enter airway Pharyngeal- Regular -- Pharyngeal -- Pharyngeal- Multi-consistency -- Pharyngeal -- Pharyngeal- Pill -- Pharyngeal -- Pharyngeal Comment various postures including minimal head turn left *only way pt able to slightly turn head, approx 30* with minimal chin tuck posture did not improve pharyngeal clearance, cued cough/"Hock" helpful to expel 90% of retained barium from pharynx/vallecular region and cleared minimal upper larynx/trachea aspiration, pt did not clear aspirates that went deeper into trachea, no cough produced with minimal aspiration noted *secretions also mixed with barium retained*  Pt's DISH narrows pharynx and contributes to his dysphagia.  Pt only senses large amounts of pharyngeal retention with puree/cracker bolus only.  CHL IP CERVICAL ESOPHAGEAL PHASE 05/30/2019 Cervical Esophageal Phase Impaired Pudding Teaspoon -- Pudding Cup -- Honey Teaspoon -- Honey Cup -- Nectar Teaspoon WFL Nectar Cup [facility] Nectar Straw -- Thin Teaspoon WFL Thin Cup WFL Thin Straw WFL Puree WFL Mechanical Soft WFL Regular -- Multi-consistency -- Pill -- Cervical Esophageal Comment Upon esophageal  sweep at end of testing, pt appeared clear - radiologist not present to confirm findings Macario Golds 05/30/2019, 4:48 PM  Kathleen Lime, MS [facility] SLP Acute Rehab Services Office [phone]                  Scheduled Meds: . atorvastatin  20 mg Oral Daily  . carbidopa-levodopa  1 tablet Oral TID  . insulin aspart  0-5 Units Subcutaneous QHS  . insulin aspart  0-9 Units Subcutaneous TID WC  . metoprolol tartrate  25 mg Oral BID  . QUEtiapine  12.5 mg Oral QHS  . vitamin B-12  1,000 mcg Oral Daily   Continuous Infusions: . ceFEPime (MAXIPIME) IV 2 g (05/31/19 0528)     LOS: 3 days    Time spent: 25 minutes    Barb Merino, MD [facility] Pager [phone]

## 2019-05-31 NOTE — Consult Note (Signed)
Consultation Note Date: 05/31/2019   Patient Name: [name] Powell  DOB: 05/16/1922  MRN: [id number]  Age / Sex: 86 y.o., male  PCP: [name], MD Referring Physician: Barb Merino, MD  Reason for Consultation: Establishing goals of care  HPI/Patient Profile: 86 y.o. male  with past medical history of atrial fibrillation on anticoagulation, hypertension, TIA, diabetes, dyslipidemia coronary artery disease and Parkinson's dementia admitted on 05/28/2019 with falls and weakness not taking anything by mouth.  Remains admitted to hospital medicine service for pneumonia and acute blood loss anemia CT scan of the hip showing hematoma but no fracture, patient given blood transfusion patient given antibiotics patient on supplemental oxygen.  Palliative medicine consultation for goals of care discussions.  Clinical Assessment and Goals of Care: [name] Powell is resting in bed.  He is asleep.  He murmurs hmm but does not awaken, does not open his eyes, does not engage or interact.  He does not appear to be in distress or discomfort.  Chart reviewed.  Discussed with bedside RN.  Call placed and discussed with patient's wife Inez Powell.  Introduced about palliative care, purpose of my call, goals of care discussions, CODE STATUS discussions, discussions pertaining to artificial nutrition and hydration all discussed:  Palliative medicine is specialized medical care for people living with serious illness. It focuses on providing relief from the symptoms and stress of a serious illness. The goal is to improve quality of life for both the patient and the family.  Goals of care: Broad aims of medical therapy in relation to the patient's values and preferences. Our aim is to provide medical care aimed at enabling patients to achieve the goals that matter most to them, given the circumstances of their particular medical situation  and their constraints.   [name] Powell and I reviewed the patient's current hospitalization.  She is thankful for having received a phone call from Dr. Sloan Leiter, patient's current hospital medicine attending physician.   We discussed about CODE STATUS.  Full code, resuscitation, CPR, use of ACLS protocol, endotracheal intubation and mechanical ventilation, ICU level of care all discussed and explained.  Patient's wife wishes to review living will information to see if patient has outlined previously about his goals and wishes.  Additionally, we talked about speech therapist following with the patient and having recommended dysphagia 2 diet with assist.  [name] Powell asks me if the patient is "giving up."  We discussed frankly but compassionately that the patient does not appear to have actively dying signs or symptoms at the moment on my examination today.  However, he is 86 years old and has several underlying past medical conditions and has been admitted with pneumonia and a hip hematoma.  Patient has high likelihood of continuing to decline, of not having meaningful stabilization/recovery, of not having adequate p.o. intake to sustain himself, has a high likelihood of having ongoing bedbound status.  Hence, discussed with her about having palliative services follow the patient at [facility] so that he may be transition to hospice services  in the near future when deemed appropriate.  She was receptive to this information and medical recommendations.  HCPOA Wife Inez Powell at [phone].  They have been married for about 30 years.  This is a second marriage for both.  They both have adult children from previous relationships.  Patient's wife Inez Powell states that she is her husband's healthcare power of attorney agent.  SUMMARY OF RECOMMENDATIONS   Full code/full scope care for now.  Patient's wife Inez Powell states that she will review their living will, healthcare power of attorney and advanced directives  paperwork.  She is considering having the patient back at [facility] independent living where they both live together.  She has already discussed with care management about having home health RN and PT and OT.  We discussed about having outpatient palliative care follow-up with the patient over there.  Concepts specific to allowing for safest possible oral diet with assistance and supervision versus risks involved with artificial nutrition and hydration options discussed in detail.  She will again look to living will for information specific to artificial nutrition.  Thank you for the consult. Code Status/Advance Care Planning:  Full code    Symptom Management:   Continue current mode of care  Palliative Prophylaxis:   Delirium Protocol  Additional Recommendations (Limitations, Scope, Preferences):  Full Scope Treatment  Psycho-social/Spiritual:   Desire for further Chaplaincy support:yes  Additional Recommendations: Caregiving  Support/Resources  Prognosis:   Unable to determine  Discharge Planning: To Be Determined      Primary Diagnoses: Present on Admission: . HCAP (healthcare-associated pneumonia) . Acute encephalopathy . Atrial fibrillation ([place]) . Acute respiratory failure with hypoxia ([place]) . CAD s/p CABG 2003 . Chronic diastolic congestive heart failure ([place]) . HTN (hypertension) . Pacemaker dependent - Medtronic . AKI (acute kidney injury) ([place]) . Right hip pain   I have reviewed the medical record, interviewed the patient and family, and examined the patient. The following aspects are pertinent.  Past Medical History:  Diagnosis Date  . Anemia    chronic  . Atrial fibrillation ([place])   . Cancer ([place])   . CHB (complete heart block) ([place])   . Coronary artery disease   . Diabetes mellitus   . Dyslipidemia   . H/O prostate cancer   . Hypertension   . New onset atrial flutter, persistent 07/04/2014  . Pacemaker 12/12/2006   Medtronic adapta  .  Pleural effusion, left   . Pneumonia 09/2015  . Prostate cancer ([place])   . 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 Ventricular tachycardia (paroxysmal) ([place]) 03/28/2014   Social History   Socioeconomic History  . Marital status: Married    Spouse name: Inez Powell  . Number of children: 2  . Years of education: 17  . Highest education level: Not on file  Occupational History    Comment: retired Art gallery manager  Tobacco Use  . Smoking status: Former Smoker    Types: Cigarettes, Cigars    Quit date: 05/14/1962    Years since quitting: 57.0  . Smokeless tobacco: Never Used  Substance and Sexual Activity  . Alcohol use: Yes    Comment: seldom  . Drug use: No  . Sexual activity: Not on file  Other Topics Concern  . Not on file  Social History Narrative   Lives at home with wife at [employer], caregiver 12 hr daily   Caffeine -seldom   Social Determinants of Health  Financial Resource Strain:   . Difficulty of Paying Living Expenses: Not on file  Food Insecurity:   . Worried About Charity fundraiser in the Last Year: Not on file  . Ran Out of Food in the Last Year: Not on file  Transportation Needs:   . Lack of Transportation (Medical): Not on file  . Lack of Transportation (Non-Medical): Not on file  Physical Activity:   . Days of Exercise per Week: Not on file  . Minutes of Exercise per Session: Not on file  Stress:   . Feeling of Stress : Not on file  Social Connections:   . Frequency of Communication with Friends and Family: Not on file  . Frequency of Social Gatherings with Friends and Family: Not on file  . Attends Religious Services: Not on file  . Active Member of Clubs or Organizations: Not on file  . Attends Archivist Meetings: Not on file  . Marital Status: Not on file   Family History  Problem Relation Age of Onset  . Heart disease Mother   . Heart attack Mother   . Other Father         sepsis  . Other Sister        brain tumor   Scheduled Meds: . atorvastatin  20 mg Oral Daily  . carbidopa-levodopa  1 tablet Oral TID  . insulin aspart  0-5 Units Subcutaneous QHS  . insulin aspart  0-9 Units Subcutaneous TID WC  . metoprolol tartrate  25 mg Oral BID  . QUEtiapine  12.5 mg Oral QHS  . vitamin B-12  1,000 mcg Oral Daily   Continuous Infusions: . ceFEPime (MAXIPIME) IV 2 g (05/31/19 0528)   PRN Meds:. Medications Prior to Admission:  Prior to Admission medications   Medication Sig Start Date End Date Taking? Authorizing Provider  acetaminophen (TYLENOL) 650 MG CR tablet Take 650 mg by mouth every 8 (eight) hours as needed for pain.   Yes [provider]  atorvastatin (LIPITOR) 20 MG tablet Take 20 mg by mouth daily.   Yes [provider]  carbidopa-levodopa (SINEMET IR) 25-100 MG tablet Take 1 tablet by mouth 3 (three) times daily. 01/03/19  Yes Penumalli, Earlean Polka, MD  Cholecalciferol (VITAMIN D PO) Take 10,000 Units by mouth every Friday. On Friday    Yes [provider]  ELIQUIS 2.5 MG TABS tablet TAKE 1 TABLET TWICE DAILY Patient taking differently: Take 2.5 mg by mouth 2 (two) times daily.  03/09/19  Yes Croitoru, Mihai, MD  furosemide (LASIX) 40 MG tablet Take 40 mg by mouth daily.   Yes [provider]  JANUVIA 50 MG tablet Take 50 mg by mouth every evening.  05/27/15  Yes [provider]  metoprolol tartrate (LOPRESSOR) 50 MG tablet TAKE 2 TABLETS IN THE MORNING  AND TAKE 1 TABLET IN THE EVENING Patient taking differently: Take 50-100 mg by mouth See admin instructions. 100mg  in am & 50mg  in evening. 03/09/19  Yes Croitoru, Mihai, MD  Multiple Vitamins-Minerals (PRESERVISION AREDS) CAPS Take 1 capsule by mouth 2 (two) times daily.   Yes [provider]  polyethylene glycol (MIRALAX / GLYCOLAX) packet Take 17 g by mouth daily. To prevent constipation    Yes [provider]  QUEtiapine (SEROQUEL) 25 MG  tablet Take 0.5 tablets (12.5 mg total) by mouth at bedtime. 03/01/19 05/28/19 Yes Donne Hazel, MD  vitamin B-12 (CYANOCOBALAMIN) 1000 MCG tablet Take 1,000 mcg by mouth daily.  Yes [provider]  cefdinir (OMNICEF) 300 MG capsule Take 1 capsule (300 mg total) by mouth 2 (two) times daily. Patient not taking: Reported on 05/28/2019 04/14/19   Shelly Coss, MD  nitrofurantoin, macrocrystal-monohydrate, (MACROBID) 100 MG capsule Take 100 mg by mouth 2 (two) times daily. 7 day supply 05/16/19   [provider]  sulfamethoxazole-trimethoprim (BACTRIM DS) 800-160 MG tablet Take 1 tablet by mouth 2 (two) times daily. 7 day supply 05/18/19   [provider]   Allergies  Allergen Reactions  . Milk-Related Compounds Cough  . Lanoxin [Digoxin] Rash    Eyelid rash  . Mexitil [Mexiletine] Other (See Comments)    unknown   Review of Systems Resting in bed, currently asleep, murmurs hmm  Physical Exam Elderly gentleman resting in bed Currently asleep but Regular work of breathing Irregularly irregular Abdomen is nondistended nontender Patient has right thigh swelling Has a pacemaker  Vital Signs: BP (!) 153/65 (BP Location: Right Arm)   Pulse 64   Temp 98 F (36.7 C) (Oral)   Resp 18   Ht 5\' 8"  (1.727 m)   Wt 76.1 kg   SpO2 92%   BMI 25.51 kg/m  Pain Scale: 0-10   Pain Score: 0-No pain   SpO2: SpO2: 92 % O2 Device:SpO2: 92 % O2 Flow Rate: .O2 Flow Rate (L/min): 3 L/min  IO: Intake/output summary:   Intake/Output Summary (Last 24 hours) at 05/31/2019 1027 Last data filed at 05/31/2019 0600 Gross per 24 hour  Intake 826.76 ml  Output 1050 ml  Net -223.24 ml    LBM: Last BM Date: 05/30/19 Baseline Weight: Weight: 76.1 kg Most recent weight: Weight: 76.1 kg     Palliative Assessment/Data:   Palliative performance scale 30%  Time In:  9.20 Time Out:  10.30 Time Total:  70 Greater than 50%  of this time was spent counseling and coordinating  care related to the above assessment and plan.  Signed by: Loistine Chance, MD   Please contact Palliative Medicine Team phone at [phone] for questions and concerns.  For individual provider: See Shea Evans

## 2019-06-01 DIAGNOSIS — Z7189 Other specified counseling: Secondary | ICD-10-CM

## 2019-06-01 DIAGNOSIS — Z515 Encounter for palliative care: Secondary | ICD-10-CM

## 2019-06-01 LAB — BASIC METABOLIC PANEL
Anion gap: 10 (ref 5–15)
BUN: 50 mg/dL — ABNORMAL HIGH (ref 8–23)
CO2: 22 mmol/L (ref 22–32)
Calcium: 8.6 mg/dL — ABNORMAL LOW (ref 8.9–10.3)
Chloride: 109 mmol/L (ref 98–111)
Creatinine, Ser: 1.87 mg/dL — ABNORMAL HIGH (ref 0.61–1.24)
GFR calc Af Amer: 35 mL/min — ABNORMAL LOW (ref >60)
GFR calc non Af Amer: 30 mL/min — ABNORMAL LOW (ref >60)
Glucose, Bld: 148 mg/dL — ABNORMAL HIGH (ref 70–99)
Potassium: 4.2 mmol/L (ref 3.5–5.1)
Sodium: 141 mmol/L (ref 135–145)

## 2019-06-01 LAB — CBC
HCT: 31.2 % — ABNORMAL LOW (ref 39.0–52.0)
Hemoglobin: 9.6 g/dL — ABNORMAL LOW (ref 13.0–17.0)
MCH: 31.6 pg (ref 26.0–34.0)
MCHC: 30.8 g/dL (ref 30.0–36.0)
MCV: 102.6 fL — ABNORMAL HIGH (ref 80.0–100.0)
Platelets: 197 10*3/uL (ref 150–400)
RBC: 3.04 MIL/uL — ABNORMAL LOW (ref 4.22–5.81)
RDW: 21.3 % — ABNORMAL HIGH (ref 11.5–15.5)
WBC: 6.2 10*3/uL (ref 4.0–10.5)
nRBC: 0.3 % — ABNORMAL HIGH (ref 0.0–0.2)

## 2019-06-01 LAB — GLUCOSE, CAPILLARY
Glucose-Capillary: 157 mg/dL — ABNORMAL HIGH (ref 70–99)
Glucose-Capillary: 173 mg/dL — ABNORMAL HIGH (ref 70–99)

## 2019-06-01 NOTE — TOC Transition Note (Signed)
Transition of Care [facility]) - CM/SW Discharge Note   Patient Details  Name: [name] Powell MRN: [id number] Date of Birth: Feb 19, 1923  [facility] CM/SW Contact:  Dessa Phi, RN Phone Number: 06/01/2019, 12:21 PM   Clinical Narrative:  PTAR called for transport back to [facility]-indep liv w/02.     Final next level of care: Home/Self Care(Indep living-[facility]) Barriers to Discharge: No Barriers Identified   Patient Goals and CMS Choice Patient states their goals for this hospitalization and ongoing recovery are:: go home CMS Medicare.gov Compare Post Acute Care list provided to:: Patient Represenative (must comment)(spouse-Betty) Choice offered to / list presented to : Spouse  Discharge Placement                       Discharge Plan and Services   Discharge Planning Services: CM Consult Post Acute Care Choice: Home Health                    HH Arranged: RN, PT, OT, Speech Therapy HH Agency: [facility] Date [place]: 05/31/19 Time [place]: 5726 Representative spoke with at [place]: [place]) Interventions     Readmission Risk Interventions Readmission Risk Prevention Plan 05/30/2019  Transportation Screening Complete  Medication Review Press photographer) Complete  PCP or Specialist appointment within 3-5 days of discharge Complete  HRI or [place] Complete  SW Recovery Care/Counseling Consult Complete  [place] Patient Refused  Some recent data might be hidden

## 2019-06-01 NOTE — Progress Notes (Signed)
Daily Progress Note   Patient Name: [name] Powell       Date: 06/01/2019 DOB: [birth year]  Age: 86 y.o. MRN#: [id number] Attending Physician: Barb Merino, MD Primary Care Physician: [name], MD Admit Date: 05/28/2019  Reason for Consultation/Follow-up: Establishing goals of care  Subjective:  [name] Powell was awake alert this morning, he was able to converse some.  This afternoon, he is resting in bed. In no distress.   Length of Stay: 4  Current Medications: Scheduled Meds:  . atorvastatin  20 mg Oral Daily  . carbidopa-levodopa  1 tablet Oral TID  . insulin aspart  0-5 Units Subcutaneous QHS  . insulin aspart  0-9 Units Subcutaneous TID WC  . metoprolol tartrate  25 mg Oral BID  . QUEtiapine  12.5 mg Oral QHS  . vitamin B-12  1,000 mcg Oral Daily    Continuous Infusions: . ceFEPime (MAXIPIME) IV 2 g (06/01/19 0622)    PRN Meds:   Physical Exam         No distress Awake alert Regular work of breathing S1 S2 Abdomen non tender Has R thigh hematoma  Vital Signs: BP (!) 161/69 (BP Location: Right Arm)   Pulse 62   Temp 98.3 F (36.8 C) (Oral)   Resp 18   Ht 5\' 8"  (1.727 m)   Wt 76.1 kg   SpO2 98%   BMI 25.51 kg/m  SpO2: SpO2: 98 % O2 Device: O2 Device: Nasal Cannula O2 Flow Rate: O2 Flow Rate (L/min): 3 L/min  Intake/output summary:   Intake/Output Summary (Last 24 hours) at 06/01/2019 1157 Last data filed at 06/01/2019 0900 Gross per 24 hour  Intake 345.11 ml  Output 900 ml  Net -554.89 ml   LBM: Last BM Date: 05/30/19 Baseline Weight: Weight: 76.1 kg Most recent weight: Weight: 76.1 kg       Palliative Assessment/Data:      Patient Active Problem List   Diagnosis Date Noted  . HCAP (healthcare-associated pneumonia) 05/28/2019  .  AKI (acute kidney injury) ([place]) 05/28/2019  . Right hip pain 05/28/2019  . Macrocytic anemia 04/09/2019  . CAP (community acquired pneumonia) 02/23/2019  . Parkinson's disease ([place]) 02/22/2019  . Acute encephalopathy 02/22/2019  . Symptomatic anemia 10/23/2018  . Pacemaker battery depletion 02/02/2017  . CHF (congestive heart failure) ([place]) 07/27/2016  .  Weakness generalized 07/27/2016  . Generalized weakness 07/27/2016  . Long term current use of anticoagulant 01/16/2016  . Acute respiratory failure with hypoxia ([place]) 09/15/2015  . Acute on chronic diastolic congestive heart failure ([place]) 09/15/2015  . CKD (chronic kidney disease), stage III ([place]) 09/15/2015  . Atrial fibrillation ([place])   . Pleural effusion, left   . Acute diastolic (congestive) heart failure ([place])   . Community acquired pneumonia 02/19/2015  . Atrial flutter ([place]) 01/20/2015  . New onset atrial flutter, persistent 07/04/2014  . Ventricular tachycardia (paroxysmal) ([place]) 03/28/2014  . Chronic diastolic congestive heart failure ([place]) 01/02/2014  . Pacemaker dependent - Medtronic 01/10/2013  . CAD s/p CABG 2003 01/10/2013  . Sinus arrest 01/10/2013  . CHB (complete heart block) ([place]) 01/10/2013  . DM2 (diabetes mellitus, type 2) ([place]) 01/10/2013  . Dyslipidemia 01/10/2013  . HTN (hypertension) 01/10/2013    Palliative Care Assessment & Plan   Patient Profile:    Assessment:  86 y.o. male  with past medical history of atrial fibrillation on anticoagulation, hypertension, TIA, diabetes, dyslipidemia coronary artery disease and Parkinson's dementia admitted on 05/28/2019 with falls and weakness not taking anything by mouth.  Remains admitted to hospital medicine service for pneumonia and acute blood loss anemia CT scan of the hip showing hematoma but no fracture, patient given blood transfusion patient given antibiotics patient on supplemental oxygen.  Palliative medicine consultation for goals of care  discussions.  Recommendations/Plan:  Recommend palliative care following the patient at his facility.    Code Status:    Code Status Orders  (From admission, onward)         Start     Ordered   05/28/19 1604  Full code  Continuous     05/28/19 1604        Code Status History    Date Active Date Inactive Code Status Order ID Comments User Context   04/09/2019 2146 04/13/2019 1513 Full Code [id number]  Etta Quill, DO ED   02/22/2019 2339 03/01/2019 2019 Full Code [id number]  Etta Quill, DO ED   10/23/2018 1914 10/25/2018 1601 Full Code [id number]  Lenore Cordia, MD ED   02/02/2017 1456 02/02/2017 2001 Full Code [id number]  [name], MD Inpatient   07/27/2016 0439 07/29/2016 1800 Full Code [id number]  Rise Patience, MD Inpatient   09/15/2015 1652 09/18/2015 2225 Full Code [id number]  Radene Gunning, NP ED   02/19/2015 2325 02/21/2015 1737 Full Code [id number]  Danford, Suann Larry, MD Inpatient   Advance Care Planning Activity    Advance Directive Documentation     Most Recent Value  Type of Advance Directive  Healthcare Power of Attorney, Living will  Pre-existing out of facility DNR order (yellow form or pink MOST form)  --  "MOST" Form in Place?  --       Prognosis:   guarded   Discharge Planning:  Back to facility with palliative.   Care plan was discussed with patient, wife on the phone on 05-31-19.   Thank you for allowing the Palliative Medicine Team to assist in the care of this patient.   Time In: 11 Time Out: 11.15 Total Time 15 Prolonged Time Billed  no       Greater than 50%  of this time was spent counseling and coordinating care related to the above assessment and plan.  Loistine Chance, MD  Please contact Palliative Medicine Team phone at [phone] for questions and concerns.

## 2019-06-01 NOTE — Discharge Summary (Signed)
Physician Discharge Summary  [name] Powell ZOX:[id number] DOB: 01-Nov-1922 DOA: 05/28/2019  PCP: [name], MD  Admit date: 05/28/2019 Discharge date: 06/01/2019  Admitted From: Home Disposition: Home, independent living facility  Recommendations for Outpatient Follow-up:  1. Follow up with PCP in 1-2 weeks 2. Please obtain BMP/CBC in one week   Home Health: PT OT speech and RN Equipment/Devices: Oxygen at home  Discharge Condition: Fair CODE STATUS: Full code Diet recommendation: Low-carb diet, aspiration precautions and pured and chopped food.  Discharge summary: 86 year old gentleman with history of chronic A. fib on anticoagulation, status post pacemaker, hypertension, history of TIA, type 2 diabetes on oral hypoglycemics coronary artery disease, Parkinson's dementia who lives with his wife at independent living place brought to the emergency room with fall, increasing lethargic and not eating well.  Also had right hip pain on presentation.  No fever.  COVID-19 negative.  Initial screening he was found to have right hip hematoma with no fracture.  Chest x-ray with abnormal bilateral lower lobe presumed aspiration pneumonia.  Also found with AKI.  Treated for following conditions:  Bilateral lower lobe abnormality, presumed aspiration pneumonia with hypoxia: Ruled in.  Retrospectively, patient had these findings in his previous imaging studies including recent CT scan.  He did not have any fever or pulmonary symptoms.  He is on 2 to 3 L of oxygen at home.  Patient does however have chronic aspiration and this was thought to be pneumonitis.  He was treated with antibiotics for 4 days.  Will discontinue further antibiotics. All-time aspiration precautions.  He underwent modified barium swallow and it was abnormal, he did okay on pured diet with thin liquids.  He will need all time supervision and dysphagia 2 diet.  Acute blood loss anemia, acute on chronic anemia due to hematoma  right thigh.  Received total 3 units of PRBC.  Baseline hemoglobin about 8.5.  After 3 units of transfusion his hemoglobin has remained stable.  CT scan did not show any fracture, however he had 11 x 8 cm big hematoma that has remained stable on clinical examination.  Presentation creatinine was 2.9, treated with IV fluids with improvement.  His level is 1.9 which is at about his baseline.  Significant blood loss, recurrent fall.  The risks of continuing anticoagulation outweigh any benefits.  This was discussed with the patient and her his wife.  Will discontinue Eliquis altogether.  His heart rate is well controlled on beta-blockers.  Patient is stabilized.  He is going home with family.  Going home with home health PT OT and speech therapy follow-up.  Also recommended community palliative care follow-up due to progressive debility, dysphagia and Parkinson's dementia.  Discharge Diagnoses:  Principal Problem:   Acute respiratory failure with hypoxia ([place]) Active Problems:   Pacemaker dependent - Medtronic   CAD s/p CABG 2003   DM2 (diabetes mellitus, type 2) (HCC)   HTN (hypertension)   Chronic diastolic congestive heart failure (HCC)   Atrial fibrillation (HCC)   Weakness generalized   Acute encephalopathy   HCAP (healthcare-associated pneumonia)   AKI (acute kidney injury) ([place])   Right hip pain    Discharge Instructions  Discharge Instructions    Diet Carb Modified   Complete by: As directed    Pureed diet, thin liquids, all time precautions   Face-to-face encounter (required for Medicare/Medicaid patients)   Complete by: As directed    I Barb Merino certify that this patient is under my care and that I, or  a nurse practitioner or physician's assistant working with me, had a face-to-face encounter that meets the physician face-to-face encounter requirements with this patient on 06/01/2019. The encounter with the patient was in whole, or in part for the following medical  condition(s) which is the primary reason for home health care (List medical condition): dementia, aspiration, debility   The encounter with the patient was in whole, or in part, for the following medical condition, which is the primary reason for home health care: dementia, anemia , fall , debility   I certify that, based on my findings, the following services are medically necessary home health services:  Nursing Physical therapy Speech language pathology     Reason for Medically Necessary Home Health Services: Skilled Nursing- Change/Decline in Patient Status   My clinical findings support the need for the above services: Unsafe ambulation due to balance issues   Further, I certify that my clinical findings support that this patient is homebound due to: Unable to leave home safely without assistance   Home Health   Complete by: As directed    To provide the following care/treatments:  PT OT SLP RN     Increase activity slowly   Complete by: As directed      Allergies as of 06/01/2019      Reactions   Milk-related Compounds Cough   Lanoxin [digoxin] Rash   Eyelid rash   Mexitil [mexiletine] Other (See Comments)   unknown      Medication List    STOP taking these medications   cefdinir 300 MG capsule Commonly known as: OMNICEF   Eliquis 2.5 MG Tabs tablet Generic drug: apixaban   nitrofurantoin (macrocrystal-monohydrate) 100 MG capsule Commonly known as: MACROBID   sulfamethoxazole-trimethoprim 800-160 MG tablet Commonly known as: BACTRIM DS     TAKE these medications   acetaminophen 650 MG CR tablet Commonly known as: TYLENOL Take 650 mg by mouth every 8 (eight) hours as needed for pain.   atorvastatin 20 MG tablet Commonly known as: LIPITOR Take 20 mg by mouth daily.   carbidopa-levodopa 25-100 MG tablet Commonly known as: SINEMET IR Take 1 tablet by mouth 3 (three) times daily.   furosemide 40 MG tablet Commonly known as: LASIX Take 40 mg by mouth  daily.   Januvia 50 MG tablet Generic drug: sitaGLIPtin Take 50 mg by mouth every evening.   metoprolol tartrate 50 MG tablet Commonly known as: LOPRESSOR TAKE 2 TABLETS IN THE MORNING  AND TAKE 1 TABLET IN THE EVENING What changed: See the new instructions.   polyethylene glycol 17 g packet Commonly known as: MIRALAX / GLYCOLAX Take 17 g by mouth daily. To prevent constipation   PreserVision AREDS Caps Take 1 capsule by mouth 2 (two) times daily.   QUEtiapine 25 MG tablet Commonly known as: SEROQUEL Take 0.5 tablets (12.5 mg total) by mouth at bedtime.   vitamin B-12 1000 MCG tablet Commonly known as: CYANOCOBALAMIN Take 1,000 mcg by mouth daily.   VITAMIN D PO Take 10,000 Units by mouth every Friday. On Friday       Allergies  Allergen Reactions  . Milk-Related Compounds Cough  . Lanoxin [Digoxin] Rash    Eyelid rash  . Mexitil [Mexiletine] Other (See Comments)    unknown    Consultations:  None    Procedures/Studies: DG Chest 1 View  Result Date: 05/28/2019 CLINICAL DATA:  Recent falls with hip pain, initial encounter EXAM: CHEST  1 VIEW COMPARISON:  04/09/2019 FINDINGS: Cardiac shadow is enlarged  but stable. Pacing device is again seen as well as postoperative change. The lungs are hypoinflated with patchy opacity throughout both lungs. No acute bony abnormality is seen. IMPRESSION: Patchy airspace opacities within both lungs increased when compared with the prior exam. This likely represents atypical/viral pneumonia. Electronically Signed   By: Inez Catalina M.D.   On: 05/28/2019 12:56   CT Head Wo Contrast  Result Date: 05/28/2019 CLINICAL DATA:  86 year old who fell twice last week, most recently 3 days ago, presenting with increasing lethargy and anorexia. Patient is currently anticoagulated with Eliquis due to atrial fibrillation. Initial encounter. EXAM: CT HEAD WITHOUT CONTRAST CT CERVICAL SPINE WITHOUT CONTRAST TECHNIQUE: Multidetector CT imaging of the  head and cervical spine was performed following the standard protocol without intravenous contrast. Multiplanar CT image reconstructions of the cervical spine were also generated. COMPARISON:  CT head 07/26/2016 and earlier. CT cervical spine 10/13/2011. FINDINGS: CT HEAD FINDINGS Brain: Moderate to severe cortical atrophy, severe deep atrophy and mild to moderate cerebellar atrophy, unchanged. Moderate to severe changes of small vessel disease of the white matter diffusely, unchanged to somewhat progressive since 2018. Vascular: Severe BILATERAL carotid siphon and mild BILATERAL vertebral artery atherosclerosis. No hyperdense vessel. Skull: No skull fracture or other focal osseous abnormality involving the skull. Sinuses/Orbits: Visualized paranasal sinuses, bilateral mastoid air cells and bilateral middle ear cavities well-aerated. Visualized orbits and globes normal in appearance. Other: None. CT CERVICAL SPINE FINDINGS Alignment: Straightening of the usual cervical lordosis. Facet joints anatomically aligned with diffuse degenerative changes. Skull base and vertebrae: No fractures identified involving the cervical spine. 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 Coronal reformatted images demonstrate an intact craniocervical junction, intact dens and intact lateral masses throughout. Soft tissues and spinal canal: No evidence of paraspinous or spinal canal hematoma. Moderate spinal stenosis at C4-5 and at C6-7 and mild spinal stenosis at C5-6, all related to the POSTERIOR longitudinal ligament ossification. Disc levels: Severe degenerative changes at the C1-C2 articulation. Facet and uncinate hypertrophy account for multilevel foraminal stenoses including mild LEFT C2-3, moderate to severe LEFT C3-4, severe LEFT and mild RIGHT C4-5, mild LEFT C5-6, mild RIGHT C6-7. Upper chest: Approximate 3.0 x 2.3 cm nodule arising from the LOWER pole of the RIGHT lobe of the thyroid  gland, unchanged since the 2013 CT and therefore benign. Focal hyperlucency in the RIGHT lung apex likely indicating localized air trapping. Other: Mild-to-moderate BILATERAL cervical carotid atherosclerosis. IMPRESSION: 1. No acute intracranial abnormality. 2. Stable moderate to severe generalized atrophy. 3. Moderate to severe chronic microvascular ischemic changes of the white matter diffusely. 4. No cervical spine fractures identified. 5. Multilevel degenerative changes and DISH involving the cervical spine with spinal stenosis at C4-5, C5-6 and C6-7 and multilevel foraminal stenoses as detailed above. Electronically Signed   By: Evangeline Dakin M.D.   On: 05/28/2019 13:10   CT Cervical Spine Wo Contrast  Result Date: 05/28/2019 CLINICAL DATA:  86 year old who fell twice last week, most recently 3 days ago, presenting with increasing lethargy and anorexia. Patient is currently anticoagulated with Eliquis due to atrial fibrillation. Initial encounter. EXAM: CT HEAD WITHOUT CONTRAST CT CERVICAL SPINE WITHOUT CONTRAST TECHNIQUE: Multidetector CT imaging of the head and cervical spine was performed following the standard protocol without intravenous contrast. Multiplanar CT image reconstructions of the cervical spine were also generated. COMPARISON:  CT head 07/26/2016 and earlier. CT cervical spine 10/13/2011. FINDINGS: CT HEAD FINDINGS Brain: Moderate to severe cortical atrophy, severe deep atrophy and mild  to moderate cerebellar atrophy, unchanged. Moderate to severe changes of small vessel disease of the white matter diffusely, unchanged to somewhat progressive since 2018. Vascular: Severe BILATERAL carotid siphon and mild BILATERAL vertebral artery atherosclerosis. No hyperdense vessel. Skull: No skull fracture or other focal osseous abnormality involving the skull. Sinuses/Orbits: Visualized paranasal sinuses, bilateral mastoid air cells and bilateral middle ear cavities well-aerated. Visualized orbits  and globes normal in appearance. Other: None. CT CERVICAL SPINE FINDINGS Alignment: Straightening of the usual cervical lordosis. Facet joints anatomically aligned with diffuse degenerative changes. Skull base and vertebrae: No fractures identified involving the cervical spine. 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 Coronal reformatted images demonstrate an intact craniocervical junction, intact dens and intact lateral masses throughout. Soft tissues and spinal canal: No evidence of paraspinous or spinal canal hematoma. Moderate spinal stenosis at C4-5 and at C6-7 and mild spinal stenosis at C5-6, all related to the POSTERIOR longitudinal ligament ossification. Disc levels: Severe degenerative changes at the C1-C2 articulation. Facet and uncinate hypertrophy account for multilevel foraminal stenoses including mild LEFT C2-3, moderate to severe LEFT C3-4, severe LEFT and mild RIGHT C4-5, mild LEFT C5-6, mild RIGHT C6-7. Upper chest: Approximate 3.0 x 2.3 cm nodule arising from the LOWER pole of the RIGHT lobe of the thyroid gland, unchanged since the 2013 CT and therefore benign. Focal hyperlucency in the RIGHT lung apex likely indicating localized air trapping. Other: Mild-to-moderate BILATERAL cervical carotid atherosclerosis. IMPRESSION: 1. No acute intracranial abnormality. 2. Stable moderate to severe generalized atrophy. 3. Moderate to severe chronic microvascular ischemic changes of the white matter diffusely. 4. No cervical spine fractures identified. 5. Multilevel degenerative changes and DISH involving the cervical spine with spinal stenosis at C4-5, C5-6 and C6-7 and multilevel foraminal stenoses as detailed above. Electronically Signed   By: Evangeline Dakin M.D.   On: 05/28/2019 13:10   CT HIP RIGHT WO CONTRAST  Result Date: 05/28/2019 CLINICAL DATA:  Increasing right hip pain since a fall 2 days ago. EXAM: CT OF THE RIGHT HIP WITHOUT CONTRAST TECHNIQUE:  Multidetector CT imaging of the right hip was performed according to the standard protocol. Multiplanar CT image reconstructions were also generated. COMPARISON:  Radiographs dated 05/28/2019 FINDINGS: Bones/Joint/Cartilage There is no fracture or dislocation. Minimal degenerative changes of the superior aspect of the right acetabulum. No joint effusion. Muscles and Tendons Normal. Soft tissues There is a 11 x 5 x 2 cm hematoma in the subcutaneous fat just lateral to the right greater trochanter with adjacent subcutaneous soft tissue stranding consistent with bruising. IMPRESSION: 1. No acute osseous abnormality of the right hip. 2. Hematoma in the subcutaneous fat just lateral to the right greater trochanter. Electronically Signed   By: Lorriane Shire M.D.   On: 05/28/2019 15:16   DG Swallowing Func-Speech Pathology  Result Date: 05/30/2019 Objective Swallowing Evaluation: Type of Study: Bedside Swallow Evaluation  Patient Details Name: [name] Powell MRN: [id number] Date of Birth: 08/05/1922 Today's Date: 05/30/2019 Time: SLP Start Time (ACUTE ONLY): 5027 -SLP Stop Time (ACUTE ONLY): 1615 SLP Time Calculation (min) (ACUTE ONLY): 40 min Past Medical History: Past Medical History: Diagnosis Date . Anemia   chronic . Atrial fibrillation ([place])  . Cancer ([name])  . CHB (complete heart block) ([place])  . Coronary artery disease  . Diabetes mellitus  . Dyslipidemia  . H/O prostate cancer  . Hypertension  . New onset atrial flutter, persistent 07/04/2014 . Pacemaker 12/12/2006  Medtronic adapta . Pleural effusion, left  .  Pneumonia 09/2015 . Prostate cancer ([place])  . S/P CABG x 4 09/04/2001  LIMA to LAD,SVG to diagonal,SVG to ramus intermedius,SVG to PDA . Ventricular tachycardia (paroxysmal) ([place]) 03/28/2014 Past Surgical History: Past Surgical History: Procedure Laterality Date . CORONARY ARTERY BYPASS GRAFT  09/04/2001  LIMA to LAD,SVG to diagonal,SVG to ramus intermedius,SVG to PDA . NM MYOVIEW LTD  01/09/2010  no ischemia .  PACEMAKER INSERTION  12/12/2006  Medtronic adapta . PPM GENERATOR CHANGEOUT N/A 02/02/2017  Procedure: PPM GENERATOR CHANGEOUT - DUAL CHAMBER;  Surgeon: [name], MD;  Location: [place] [facility];  Service: Cardiovascular;  Laterality: N/A; . PROSTATE SURGERY  2001  cancer . TONSILLECTOMY   HPI: 86 yo male adm [facility] with FTT, AMS, diagnosed with HCAP with right hip pain and falls.  Pt found to have a large hematoma on right hip.  PMH + for Parkinsons, pna 02/2015, CAD, C4-C7 DISH and he admits to some difficulties swallowing.  Pt is a FULL code and he states he "wants them to decide that" when SLP inquired (SLP asked due to concern for level of dysphagia).  Pt CT showed moderate atrophy.  Pt is on 2 liters oxygen and is receiving a blood transfusion.  CXR 1/18 showed bilateral patchy opacities incr since prior exam and consistent with atypical/viral pna per radiologist report.  Subjective: pt in bed, eyes closed but responds to slp with delay Assessment / Plan / Recommendation CHL IP CLINICAL IMPRESSIONS 05/30/2019 Clinical Impression Pt presents with moderately severe oropharyngeal sensorimotor dysphagia likely due to his Parkinson's disease with some component of obstructive characteristics due to DISH.  Lingual pumping, decreased propulsion observed across consistencies.  Pharyngeal swallow c/b decreased laryngeal elevation/closure and compromised tongue base retraction.  Above characteristics result in pharyngeal retention with all boluses and laryngeal penetration/minimal aspiration of liquids.  Pharyngeal secretion retention also noted which mixed with barium and was also mildly aspirated.  Various postures including minimal head turn left (*pt with very limited neck ROM-and only to the left approx 30*) with minimal chin tuck posture did not improve pharyngeal clearance/prevent pharyngeal retention.  Dry swallows and liquid swallows helpful to decrease pharyngeal retention.  Pt only senses large amounts  of pharyngeal retention.    Cued cough/"Hock" was most helpful to expel 90% of retained barium from pharynx/vallecular region and cleared minimal upper larynx/trachea aspiration and this has likely helped pt with airway protection.   Pt did not clear aspirates that spilled deeper into trachea with cued cough but he did only mildly aspirate even when taxed.  No reflexive cough produced during entire MBS with swallowing barium (but observed swallowing liquids).   Given pt's propensity to cough with po and combined with results of MBS, certainly pt experiences aspiration for which he has managed.  Pt's DISH narrows pharynx and contributes to his dysphagia also.   SLP expressed gratitude to pt for participating in [facility] and using teach back educated him to findings.  Recommend to initiate swallow precautions/strategies to mitigate risk.  Continue to recommend consideration for [place] meeting given pt's recurrent admission, FTT, falls and progressive neurological disease.  Will follow to provide pt/family with education and initiate exercises to maximize swallow function. SLP Visit Diagnosis Dysphagia, oropharyngeal phase (R13.12) Attention and concentration deficit following -- Frontal lobe and executive function deficit following -- Impact on safety and function Risk for inadequate nutrition/hydration;Moderate aspiration risk   CHL IP TREATMENT RECOMMENDATION 05/30/2019 Treatment Recommendations Therapy as outlined in treatment plan below   Prognosis 05/30/2019  Prognosis for Safe Diet Advancement Fair Barriers to Reach Goals Time post onset Barriers/Prognosis Comment -- CHL IP DIET RECOMMENDATION 05/30/2019 SLP Diet Recommendations Thin liquid;Dysphagia 2 (Fine chop) solids Liquid Administration via -- Medication Administration Crushed with puree Compensations Slow rate;Small sips/bites;Multiple dry swallows after each bite/sip;Follow solids with liquid Postural Changes Seated upright at 90 degrees;Remain semi-upright after  after feeds/meals (Comment)   CHL IP OTHER RECOMMENDATIONS 05/30/2019 Recommended Consults -- Oral Care Recommendations Oral care QID Other Recommendations Have oral suction available   CHL IP FOLLOW UP RECOMMENDATIONS 05/30/2019 Follow up Recommendations Home health SLP   CHL IP FREQUENCY AND DURATION 05/30/2019 Speech Therapy Frequency (ACUTE ONLY) min 2x/week Treatment Duration 2 weeks      CHL IP ORAL PHASE 05/30/2019 Oral Phase Impaired Oral - Pudding Teaspoon -- Oral - Pudding Cup -- Oral - Honey Teaspoon -- Oral - Honey Cup -- Oral - Nectar Teaspoon Weak lingual manipulation;Reduced posterior propulsion;Decreased bolus cohesion Oral - Nectar Cup Weak lingual manipulation;Reduced posterior propulsion;Decreased bolus cohesion Oral - Nectar Straw -- Oral - Thin Teaspoon Weak lingual manipulation;Reduced posterior propulsion;Decreased bolus cohesion Oral - Thin Cup Weak lingual manipulation;Reduced posterior propulsion;Decreased bolus cohesion Oral - Thin Straw Weak lingual manipulation;Reduced posterior propulsion;Decreased bolus cohesion Oral - Puree Lingual pumping;Weak lingual manipulation;Reduced posterior propulsion;Decreased bolus cohesion Oral - Mech Soft Weak lingual manipulation;Lingual pumping;Impaired mastication;Reduced posterior propulsion;Decreased bolus cohesion Oral - Regular -- Oral - Multi-Consistency -- Oral - Pill -- Oral Phase - Comment --  CHL IP PHARYNGEAL PHASE 05/30/2019 Pharyngeal Phase Impaired Pharyngeal- Pudding Teaspoon -- Pharyngeal -- Pharyngeal- Pudding Cup -- Pharyngeal -- Pharyngeal- Honey Teaspoon -- Pharyngeal -- Pharyngeal- Honey Cup -- Pharyngeal -- Pharyngeal- Nectar Teaspoon Reduced epiglottic inversion;Reduced tongue base retraction;Reduced laryngeal elevation;Pharyngeal residue - valleculae;Pharyngeal residue - pyriform;Reduced anterior laryngeal mobility Pharyngeal Material does not enter airway Pharyngeal- Nectar Cup Reduced epiglottic inversion;Reduced laryngeal  elevation;Reduced airway/laryngeal closure;Pharyngeal residue - valleculae;Pharyngeal residue - pyriform;Reduced anterior laryngeal mobility Pharyngeal Material enters airway, remains ABOVE vocal cords and not ejected out Pharyngeal- Nectar Straw -- Pharyngeal -- Pharyngeal- Thin Teaspoon Reduced epiglottic inversion;Reduced laryngeal elevation;Reduced airway/laryngeal closure;Pharyngeal residue - valleculae;Pharyngeal residue - pyriform;Reduced tongue base retraction;Reduced anterior laryngeal mobility Pharyngeal Material enters airway, CONTACTS cords and not ejected out Pharyngeal- Thin Cup Pharyngeal residue - valleculae;Reduced epiglottic inversion;Pharyngeal residue - pyriform;Reduced airway/laryngeal closure;Reduced tongue base retraction;Reduced laryngeal elevation;Reduced anterior laryngeal mobility Pharyngeal Material enters airway, passes BELOW cords without attempt by patient to eject out (silent aspiration);Material enters airway, passes BELOW cords then ejected out Pharyngeal- Thin Straw Reduced tongue base retraction;Reduced airway/laryngeal closure;Reduced epiglottic inversion;Pharyngeal residue - valleculae;Pharyngeal residue - pyriform;Reduced anterior laryngeal mobility;Reduced laryngeal elevation Pharyngeal Material enters airway, passes BELOW cords without attempt by patient to eject out (silent aspiration);Material enters airway, passes BELOW cords then ejected out Pharyngeal- Puree Reduced epiglottic inversion;Reduced tongue base retraction;Pharyngeal residue - valleculae;Pharyngeal residue - pyriform;Reduced laryngeal elevation;Reduced airway/laryngeal closure;Reduced anterior laryngeal mobility Pharyngeal Material does not enter airway Pharyngeal- Mechanical Soft Reduced epiglottic inversion;Reduced laryngeal elevation;Reduced airway/laryngeal closure;Reduced tongue base retraction;Reduced anterior laryngeal mobility;Pharyngeal residue - valleculae;Pharyngeal residue - pyriform Pharyngeal  Material does not enter airway Pharyngeal- Regular -- Pharyngeal -- Pharyngeal- Multi-consistency -- Pharyngeal -- Pharyngeal- Pill -- Pharyngeal -- Pharyngeal Comment various postures including minimal head turn left *only way pt able to slightly turn head, approx 30* with minimal chin tuck posture did not improve pharyngeal clearance, cued cough/"Hock" helpful to expel 90% of retained barium from pharynx/vallecular region and cleared minimal upper larynx/trachea aspiration, pt did not clear aspirates that went deeper  into trachea, no cough produced with minimal aspiration noted *secretions also mixed with barium retained*  Pt's DISH narrows pharynx and contributes to his dysphagia.  Pt only senses large amounts of pharyngeal retention with puree/cracker bolus only.  CHL IP CERVICAL ESOPHAGEAL PHASE 05/30/2019 Cervical Esophageal Phase Impaired Pudding Teaspoon -- Pudding Cup -- Honey Teaspoon -- Honey Cup -- Nectar Teaspoon WFL Nectar Cup WFL Nectar Straw -- Thin Teaspoon WFL Thin Cup WFL Thin Straw WFL Puree WFL Mechanical Soft WFL Regular -- Multi-consistency -- Pill -- Cervical Esophageal Comment Upon esophageal sweep at end of testing, pt appeared clear - radiologist not present to confirm findings Macario Golds 05/30/2019, 4:48 PM  Kathleen Lime, MS [facility] SLP Acute Rehab Services Office [phone]             DG HIP UNILAT WITH PELVIS 2-3 VIEWS RIGHT  Result Date: 05/28/2019 CLINICAL DATA:  Recent falls with right hip pain, initial encounter EXAM: DG HIP (WITH OR WITHOUT PELVIS) 2-3V RIGHT COMPARISON:  05/23/2019 FINDINGS: Pelvic ring is intact. Degenerative changes of the lumbar spine are seen. No acute fracture or dislocation is noted. Degenerative changes of the hip joints are noted. Prostate brachytherapy seeds are seen. IMPRESSION: No acute abnormality noted. Electronically Signed   By: Inez Catalina M.D.   On: 05/28/2019 12:54    Subjective: Patient seen and examined.  No overnight events.  Poor  historian.  Denies any pain on his hip.  Did not like his breakfast.  Eager to go home.   Discharge Exam: Vitals:   06/01/19 0457 06/01/19 0953  BP: (!) 161/69   Pulse: (!) 59 62  Resp: 18   Temp: 98.3 F (36.8 C)   SpO2: 98%    Vitals:   05/31/19 1318 05/31/19 1957 06/01/19 0457 06/01/19 0953  BP: (!) 157/71 (!) 163/72 (!) 161/69   Pulse: (!) 59 (!) 59 (!) 59 62  Resp: 17 20 18    Temp: 98 F (36.7 C) 97.7 F (36.5 C) 98.3 F (36.8 C)   TempSrc: Oral Oral Oral   SpO2: 100% 100% 98%   Weight:      Height:        General: Pt is alert, awake, not in acute distress, chronically sick looking but not in any distress.  He is currently on 3 L of oxygen with no distress. Cardiovascular: Irregularly irregular, pacemaker present on left side.   S1/S2 +, no rubs, no gallops Respiratory: CTA bilaterally, no wheezing, no rhonchi Abdominal: Soft, NT, ND, bowel sounds + Extremities: no edema, no cyanosis Ecchymosis and firm swelling right lateral thigh that has been stable since initial presentation.  Distal neurovascular status intact.   The results of significant diagnostics from this hospitalization (including imaging, microbiology, ancillary and laboratory) are listed below for reference.     Microbiology: Recent Results (from the past 240 hour(s))  Urine culture     Status: Abnormal   Collection Time: 05/28/19 12:51 PM   Specimen: Urine, Random  Result Value Ref Range Status   Specimen Description   Final    URINE, RANDOM Performed at [facility], [address], [place], [place] [address]    Special Requests   Final    NONE Performed at [facility], [place] [address], [place], [place] [address]    Culture (A)  Final    <10,000 COLONIES/mL INSIGNIFICANT GROWTH Performed at [place] [address], [place], [place] [address]    Report Status  05/29/2019 FINAL  Final  Respiratory Panel by RT PCR (Flu A&B, Covid) - Nasopharyngeal Swab      Status: None   Collection Time: 05/28/19  1:44 PM   Specimen: Nasopharyngeal Swab  Result Value Ref Range Status   SARS Coronavirus 2 by RT PCR NEGATIVE NEGATIVE Final    Comment: (NOTE) SARS-CoV-2 target nucleic acids are NOT DETECTED. The SARS-CoV-2 RNA is generally detectable in upper respiratoy specimens during the acute phase of infection. The lowest concentration of SARS-CoV-2 viral copies this assay can detect is 131 copies/mL. A negative result does not preclude SARS-Cov-2 infection and should not be used as the sole basis for treatment or other patient management decisions. A negative result may occur with  improper specimen collection/handling, submission of specimen other than nasopharyngeal swab, presence of viral mutation(s) within the areas targeted by this assay, and inadequate number of viral copies (<131 copies/mL). A negative result must be combined with clinical observations, patient history, and epidemiological information. The expected result is Negative. Fact Sheet for Patients:  PinkCheek.be Fact Sheet for Healthcare Providers:  GravelBags.it This test is not yet ap proved or cleared by the [place] FDA and  has been authorized for detection and/or diagnosis of SARS-CoV-2 by FDA under an Emergency Use Authorization (EUA). This EUA will remain  in effect (meaning this test can be used) for the duration of the COVID-19 declaration under Section 564(b)(1) of the Act, 21 U.S.C. section 360bbb-3(b)(1), unless the authorization is terminated or revoked sooner.    Influenza A by PCR NEGATIVE NEGATIVE Final   Influenza B by PCR NEGATIVE NEGATIVE Final    Comment: (NOTE) The Xpert Xpress SARS-CoV-2/FLU/RSV assay is intended as an aid in  the diagnosis of influenza from Nasopharyngeal swab specimens and  should not be used as a sole basis for treatment. Nasal washings and  aspirates are unacceptable for  Xpert Xpress SARS-CoV-2/FLU/RSV  testing. Fact Sheet for Patients: PinkCheek.be Fact Sheet for Healthcare Providers: GravelBags.it This test is not yet approved or cleared by the [place] FDA and  has been authorized for detection and/or diagnosis of SARS-CoV-2 by  FDA under an Emergency Use Authorization (EUA). This EUA will remain  in effect (meaning this test can be used) for the duration of the  Covid-19 declaration under Section 564(b)(1) of the Act, 21  U.S.C. section 360bbb-3(b)(1), unless the authorization is  terminated or revoked. Performed at [facility], [place] [address], [place], [place] [address]   SARS CORONAVIRUS 2 (TAT 6-24 HRS) Nasopharyngeal Nasopharyngeal Swab     Status: None   Collection Time: 05/28/19  3:38 PM   Specimen: Nasopharyngeal Swab  Result Value Ref Range Status   SARS Coronavirus 2 NEGATIVE NEGATIVE Final    Comment: (NOTE) SARS-CoV-2 target nucleic acids are NOT DETECTED. The SARS-CoV-2 RNA is generally detectable in upper and lower respiratory specimens during the acute phase of infection. Negative results do not preclude SARS-CoV-2 infection, do not rule out co-infections with other pathogens, and should not be used as the sole basis for treatment or other patient management decisions. Negative results must be combined with clinical observations, patient history, and epidemiological information. The expected result is Negative. Fact Sheet for Patients: SugarRoll.be Fact Sheet for Healthcare Providers: https://www.woods-mathews.com/ This test is not yet approved or cleared by the [place] FDA and  has been authorized for detection and/or diagnosis of SARS-CoV-2 by FDA under an Emergency Use Authorization (EUA). This EUA will remain  in effect (meaning this  test can be used) for the duration of the COVID-19 declaration  under Section 56 4(b)(1) of the Act, 21 U.S.C. section 360bbb-3(b)(1), unless the authorization is terminated or revoked sooner. Performed at [facility], [place] [address], [place], [place] [address]   MRSA PCR Screening     Status: None   Collection Time: 05/30/19  8:12 AM   Specimen: Nasopharyngeal  Result Value Ref Range Status   MRSA by PCR NEGATIVE NEGATIVE Final    Comment:        The GeneXpert MRSA Assay (FDA approved for NASAL specimens only), is one component of a comprehensive MRSA colonization surveillance program. It is not intended to diagnose MRSA infection nor to guide or monitor treatment for MRSA infections. Performed at [facility], [place] [address] [place], [place] [address]      Labs: BNP (last 3 results) Recent Labs    10/23/18 2118 02/22/19 2348 04/09/19 1846  BNP 906.8* 577.5* 267.1*   Basic Metabolic Panel: Recent Labs  Lab 05/28/19 1111 05/29/19 0515 05/30/19 0335 05/31/19 0404 06/01/19 0414  NA 138 139 141 142 141  K 5.4* 5.0 4.4 4.0 4.2  CL 104 105 110 110 109  CO2 25 24 24 23 22   GLUCOSE 188* 197* 170* 124* 148*  BUN 59* 56* 57* 52* 50*  CREATININE 2.97* 2.55* 2.24* 1.90* 1.87*  CALCIUM 8.4* 8.8* 8.4* 8.5* 8.6*   Liver Function Tests: Recent Labs  Lab 05/28/19 1111  AST 15  ALT 7  ALKPHOS 107  BILITOT 0.5  PROT 7.0  ALBUMIN 3.1*   No results for input(s): LIPASE, AMYLASE in the last 168 hours. No results for input(s): AMMONIA in the last 168 hours. CBC: Recent Labs  Lab 05/28/19 1111 05/29/19 0515 05/30/19 0335 05/31/19 0404 06/01/19 0414  WBC 11.4* 13.4* 8.7 5.9 6.2  NEUTROABS 6.6  --   --   --   --   HGB 7.0* 8.1* 6.7* 8.7* 9.6*  HCT 23.3* 26.1* 22.3* 28.4* 31.2*  MCV 113.7* 106.5* 108.8* 102.9* 102.6*  PLT 193 193 168 179 197   Cardiac Enzymes: No results for input(s): CKTOTAL, CKMB, CKMBINDEX, TROPONINI in the last 168 hours. BNP: Invalid input(s): POCBNP CBG: Recent Labs  Lab  05/31/19 0749 05/31/19 1116 05/31/19 1627 05/31/19 2014 06/01/19 0735  GLUCAP 117* 152* 131* 161* 157*   D-Dimer No results for input(s): DDIMER in the last 72 hours. Hgb A1c No results for input(s): HGBA1C in the last 72 hours. Lipid Profile No results for input(s): CHOL, HDL, LDLCALC, TRIG, CHOLHDL, LDLDIRECT in the last 72 hours. Thyroid function studies No results for input(s): TSH, T4TOTAL, T3FREE, THYROIDAB in the last 72 hours.  Invalid input(s): FREET3 Anemia work up No results for input(s): VITAMINB12, FOLATE, FERRITIN, TIBC, IRON, RETICCTPCT in the last 72 hours. Urinalysis    Component Value Date/Time   COLORURINE YELLOW 05/28/2019 1251   APPEARANCEUR CLEAR 05/28/2019 1251   LABSPEC 1.014 05/28/2019 1251   PHURINE 5.0 05/28/2019 1251   GLUCOSEU NEGATIVE 05/28/2019 1251   HGBUR NEGATIVE 05/28/2019 1251   BILIRUBINUR NEGATIVE 05/28/2019 1251   KETONESUR NEGATIVE 05/28/2019 1251   PROTEINUR 30 (A) 05/28/2019 1251   UROBILINOGEN 1.0 02/19/2015 2354   NITRITE NEGATIVE 05/28/2019 1251   LEUKOCYTESUR NEGATIVE 05/28/2019 1251   Sepsis Labs Invalid input(s): PROCALCITONIN,  WBC,  LACTICIDVEN Microbiology Recent Results (from the past 240 hour(s))  Urine culture     Status: Abnormal   Collection Time: 05/28/19 12:51 PM  Specimen: Urine, Random  Result Value Ref Range Status   Specimen Description   Final    URINE, RANDOM Performed at [place] [address] [place], [place] [address]    Special Requests   Final    NONE Performed at [facility], [place] [address], [place], [place] [address]    Culture (A)  Final    <10,000 COLONIES/mL INSIGNIFICANT GROWTH Performed at [place] [address], [place], [place] [address]    Report Status 05/29/2019 FINAL  Final  Respiratory Panel by RT PCR (Flu A&B, Covid) - Nasopharyngeal Swab     Status: None   Collection Time: 05/28/19  1:44 PM   Specimen: Nasopharyngeal Swab   Result Value Ref Range Status   SARS Coronavirus 2 by RT PCR NEGATIVE NEGATIVE Final    Comment: (NOTE) SARS-CoV-2 target nucleic acids are NOT DETECTED. The SARS-CoV-2 RNA is generally detectable in upper respiratoy specimens during the acute phase of infection. The lowest concentration of SARS-CoV-2 viral copies this assay can detect is 131 copies/mL. A negative result does not preclude SARS-Cov-2 infection and should not be used as the sole basis for treatment or other patient management decisions. A negative result may occur with  improper specimen collection/handling, submission of specimen other than nasopharyngeal swab, presence of viral mutation(s) within the areas targeted by this assay, and inadequate number of viral copies (<131 copies/mL). A negative result must be combined with clinical observations, patient history, and epidemiological information. The expected result is Negative. Fact Sheet for Patients:  PinkCheek.be Fact Sheet for Healthcare Providers:  GravelBags.it This test is not yet ap proved or cleared by the [place] FDA and  has been authorized for detection and/or diagnosis of SARS-CoV-2 by FDA under an Emergency Use Authorization (EUA). This EUA will remain  in effect (meaning this test can be used) for the duration of the COVID-19 declaration under Section 564(b)(1) of the Act, 21 U.S.C. section 360bbb-3(b)(1), unless the authorization is terminated or revoked sooner.    Influenza A by PCR NEGATIVE NEGATIVE Final   Influenza B by PCR NEGATIVE NEGATIVE Final    Comment: (NOTE) The Xpert Xpress SARS-CoV-2/FLU/RSV assay is intended as an aid in  the diagnosis of influenza from Nasopharyngeal swab specimens and  should not be used as a sole basis for treatment. Nasal washings and  aspirates are unacceptable for Xpert Xpress SARS-CoV-2/FLU/RSV  testing. Fact Sheet for  Patients: PinkCheek.be Fact Sheet for Healthcare Providers: GravelBags.it This test is not yet approved or cleared by the [place] FDA and  has been authorized for detection and/or diagnosis of SARS-CoV-2 by  FDA under an Emergency Use Authorization (EUA). This EUA will remain  in effect (meaning this test can be used) for the duration of the  Covid-19 declaration under Section 564(b)(1) of the Act, 21  U.S.C. section 360bbb-3(b)(1), unless the authorization is  terminated or revoked. Performed at [facility], [place] [address], [place], [place] [address]   SARS CORONAVIRUS 2 (TAT 6-24 HRS) Nasopharyngeal Nasopharyngeal Swab     Status: None   Collection Time: 05/28/19  3:38 PM   Specimen: Nasopharyngeal Swab  Result Value Ref Range Status   SARS Coronavirus 2 NEGATIVE NEGATIVE Final    Comment: (NOTE) SARS-CoV-2 target nucleic acids are NOT DETECTED. The SARS-CoV-2 RNA is generally detectable in upper and lower respiratory specimens during the acute phase of infection. Negative results do not preclude SARS-CoV-2 infection, do not rule  out co-infections with other pathogens, and should not be used as the sole basis for treatment or other patient management decisions. Negative results must be combined with clinical observations, patient history, and epidemiological information. The expected result is Negative. Fact Sheet for Patients: SugarRoll.be Fact Sheet for Healthcare Providers: https://www.woods-mathews.com/ This test is not yet approved or cleared by the [place] FDA and  has been authorized for detection and/or diagnosis of SARS-CoV-2 by FDA under an Emergency Use Authorization (EUA). This EUA will remain  in effect (meaning this test can be used) for the duration of the COVID-19 declaration under Section 56 4(b)(1) of the Act, 21 U.S.C. section  360bbb-3(b)(1), unless the authorization is terminated or revoked sooner. Performed at [facility], [place] [address], [place], [place] [address]   MRSA PCR Screening     Status: None   Collection Time: 05/30/19  8:12 AM   Specimen: Nasopharyngeal  Result Value Ref Range Status   MRSA by PCR NEGATIVE NEGATIVE Final    Comment:        The GeneXpert MRSA Assay (FDA approved for NASAL specimens only), is one component of a comprehensive MRSA colonization surveillance program. It is not intended to diagnose MRSA infection nor to guide or monitor treatment for MRSA infections. Performed at [facility], [place] [address], [place], [place] [address]      Time coordinating discharge:  45 minutes  SIGNED:   Barb Merino, MD  [facility] 06/01/2019, 10:27 AM

## 2019-06-01 NOTE — Progress Notes (Signed)
Physical Therapy Treatment Patient Details Name: [name] Powell MRN: [id number] DOB: 1922-06-05 Today's Date: 06/01/2019    History of Present Illness Patient is a 86 year old male with history of atrial fibrillation on anticoagulation, hypertension, history of process TIA, diabetes mellitus type 2, NIDDM, dyslipidemia, CAD, dementia presented from [facility] independent living facility.  Pt had 2 falls since last week. Pt being admitted with acut resp failure with PNE and R hip hematoma.  Pt's hgb was 6.7 this am but he has received 2 units PRBC.    PT Comments    Pt assisted to sitting EOB and unable to stand today.  Pt requiring increased assist today compared to previous session which he was able to ambulate so RN notified.  Pt supposed to d/c back to ILF today however continue to recommend SNF upon d/c.    Follow Up Recommendations  SNF     Equipment Recommendations  None recommended by PT    Recommendations for Other Services       Precautions / Restrictions Precautions Precautions: Fall Precaution Comments: currently on 3L O2 Restrictions Weight Bearing Restrictions: No    Mobility  Bed Mobility Overal bed mobility: Needs Assistance Bed Mobility: Supine to Sit;Sit to Supine     Supine to sit: Max assist Sit to supine: Max assist   General bed mobility comments: increased assist for upper and lower body, utilized bed pad for scooting to EOB  Transfers Overall transfer level: Needs assistance Equipment used: Rolling walker (2 wheeled) Transfers: Sit to/from Stand Sit to Stand: Total assist         General transfer comment: pt attempted x3 however unable to assist with standing and pushing walker forward with forward flexion (requiring assist to prevent forward "nose dive" to floor), pt also reporting seeing water on the floor (none present)  Ambulation/Gait                 Stairs             Wheelchair Mobility    Modified Rankin  (Stroke Patients Only)       Balance   Sitting-balance support: Bilateral upper extremity supported;Feet supported Sitting balance-Leahy Scale: Zero Sitting balance - Comments: requiring external support                                    Cognition Arousal/Alertness: Awake/alert Behavior During Therapy: Flat affect Overall Cognitive Status: No family/caregiver present to determine baseline cognitive functioning                                 General Comments: Delayed response      Exercises      General Comments        Pertinent Vitals/Pain Pain Assessment: No/denies pain    Home Living                      Prior Function            PT Goals (current goals can now be found in the care plan section) Progress towards PT goals: Progressing toward goals    Frequency    Min 2X/week      PT Plan Current plan remains appropriate    Co-evaluation              AM-PAC PT "6 Clicks" Mobility  Outcome Measure  Help needed turning from your back to your side while in a flat bed without using bedrails?: A Lot Help needed moving from lying on your back to sitting on the side of a flat bed without using bedrails?: A Lot Help needed moving to and from a bed to a chair (including a wheelchair)?: Total Help needed standing up from a chair using your arms (e.g., wheelchair or bedside chair)?: Total Help needed to walk in hospital room?: Total Help needed climbing 3-5 steps with a railing? : Total 6 Click Score: 8    End of Session Equipment Utilized During Treatment: Gait belt;Oxygen Activity Tolerance: Patient limited by fatigue Patient left: with call bell/phone within reach;in bed;with bed alarm set Nurse Communication: Mobility status PT Visit Diagnosis: Unsteadiness on feet (R26.81);History of falling (Z91.81);Muscle weakness (generalized) (M62.81)     Time: 3953-2023 PT Time Calculation (min) (ACUTE ONLY): 24  min  Charges:  $Therapeutic Activity: 8-22 mins                    Arlyce Dice, DPT Acute Rehabilitation Services Office: [phone]     Banks Chaikin,KATHrine E 06/01/2019, 1:03 PM

## 2019-06-02 DIAGNOSIS — G20 Parkinson's disease: Secondary | ICD-10-CM | POA: Diagnosis not present

## 2019-06-05 DIAGNOSIS — D649 Anemia, unspecified: Secondary | ICD-10-CM | POA: Diagnosis not present

## 2019-06-05 DIAGNOSIS — N183 Chronic kidney disease, stage 3 unspecified: Secondary | ICD-10-CM | POA: Diagnosis not present

## 2019-06-05 DIAGNOSIS — F0391 Unspecified dementia with behavioral disturbance: Secondary | ICD-10-CM | POA: Diagnosis not present

## 2019-06-05 DIAGNOSIS — G20 Parkinson's disease: Secondary | ICD-10-CM | POA: Diagnosis not present

## 2019-06-05 DIAGNOSIS — R2681 Unsteadiness on feet: Secondary | ICD-10-CM | POA: Diagnosis not present

## 2019-06-06 DIAGNOSIS — E1140 Type 2 diabetes mellitus with diabetic neuropathy, unspecified: Secondary | ICD-10-CM | POA: Diagnosis not present

## 2019-06-06 DIAGNOSIS — Z950 Presence of cardiac pacemaker: Secondary | ICD-10-CM | POA: Diagnosis not present

## 2019-06-06 DIAGNOSIS — I10 Essential (primary) hypertension: Secondary | ICD-10-CM | POA: Diagnosis not present

## 2019-06-06 DIAGNOSIS — R269 Unspecified abnormalities of gait and mobility: Secondary | ICD-10-CM | POA: Diagnosis not present

## 2019-06-06 DIAGNOSIS — I2510 Atherosclerotic heart disease of native coronary artery without angina pectoris: Secondary | ICD-10-CM | POA: Diagnosis not present

## 2019-06-06 DIAGNOSIS — G20 Parkinson's disease: Secondary | ICD-10-CM | POA: Diagnosis not present

## 2019-06-06 DIAGNOSIS — E119 Type 2 diabetes mellitus without complications: Secondary | ICD-10-CM | POA: Diagnosis not present

## 2019-06-06 DIAGNOSIS — I509 Heart failure, unspecified: Secondary | ICD-10-CM | POA: Diagnosis not present

## 2019-06-06 DIAGNOSIS — M6281 Muscle weakness (generalized): Secondary | ICD-10-CM | POA: Diagnosis not present

## 2019-06-08 DIAGNOSIS — E871 Hypo-osmolality and hyponatremia: Secondary | ICD-10-CM | POA: Diagnosis not present

## 2019-06-08 DIAGNOSIS — E1140 Type 2 diabetes mellitus with diabetic neuropathy, unspecified: Secondary | ICD-10-CM | POA: Diagnosis not present

## 2019-06-08 DIAGNOSIS — I10 Essential (primary) hypertension: Secondary | ICD-10-CM | POA: Diagnosis not present

## 2019-06-08 DIAGNOSIS — Z950 Presence of cardiac pacemaker: Secondary | ICD-10-CM | POA: Diagnosis not present

## 2019-06-08 DIAGNOSIS — M6281 Muscle weakness (generalized): Secondary | ICD-10-CM | POA: Diagnosis not present

## 2019-06-08 DIAGNOSIS — G20 Parkinson's disease: Secondary | ICD-10-CM | POA: Diagnosis not present

## 2019-06-08 DIAGNOSIS — E119 Type 2 diabetes mellitus without complications: Secondary | ICD-10-CM | POA: Diagnosis not present

## 2019-06-08 DIAGNOSIS — I509 Heart failure, unspecified: Secondary | ICD-10-CM | POA: Diagnosis not present

## 2019-06-08 DIAGNOSIS — I2510 Atherosclerotic heart disease of native coronary artery without angina pectoris: Secondary | ICD-10-CM | POA: Diagnosis not present

## 2019-06-08 DIAGNOSIS — R269 Unspecified abnormalities of gait and mobility: Secondary | ICD-10-CM | POA: Diagnosis not present

## 2019-06-11 DIAGNOSIS — E1140 Type 2 diabetes mellitus with diabetic neuropathy, unspecified: Secondary | ICD-10-CM | POA: Diagnosis not present

## 2019-06-11 DIAGNOSIS — R269 Unspecified abnormalities of gait and mobility: Secondary | ICD-10-CM | POA: Diagnosis not present

## 2019-06-11 DIAGNOSIS — Z20828 Contact with and (suspected) exposure to other viral communicable diseases: Secondary | ICD-10-CM | POA: Diagnosis not present

## 2019-06-11 DIAGNOSIS — Z950 Presence of cardiac pacemaker: Secondary | ICD-10-CM | POA: Diagnosis not present

## 2019-06-11 DIAGNOSIS — I509 Heart failure, unspecified: Secondary | ICD-10-CM | POA: Diagnosis not present

## 2019-06-11 DIAGNOSIS — E119 Type 2 diabetes mellitus without complications: Secondary | ICD-10-CM | POA: Diagnosis not present

## 2019-06-11 DIAGNOSIS — Z1159 Encounter for screening for other viral diseases: Secondary | ICD-10-CM | POA: Diagnosis not present

## 2019-06-11 DIAGNOSIS — G20 Parkinson's disease: Secondary | ICD-10-CM | POA: Diagnosis not present

## 2019-06-11 DIAGNOSIS — I10 Essential (primary) hypertension: Secondary | ICD-10-CM | POA: Diagnosis not present

## 2019-06-11 DIAGNOSIS — I2510 Atherosclerotic heart disease of native coronary artery without angina pectoris: Secondary | ICD-10-CM | POA: Diagnosis not present

## 2019-06-11 DIAGNOSIS — M6281 Muscle weakness (generalized): Secondary | ICD-10-CM | POA: Diagnosis not present

## 2019-06-13 DIAGNOSIS — I10 Essential (primary) hypertension: Secondary | ICD-10-CM | POA: Diagnosis not present

## 2019-06-13 DIAGNOSIS — G20 Parkinson's disease: Secondary | ICD-10-CM | POA: Diagnosis not present

## 2019-06-13 DIAGNOSIS — M6281 Muscle weakness (generalized): Secondary | ICD-10-CM | POA: Diagnosis not present

## 2019-06-13 DIAGNOSIS — R269 Unspecified abnormalities of gait and mobility: Secondary | ICD-10-CM | POA: Diagnosis not present

## 2019-06-13 DIAGNOSIS — E119 Type 2 diabetes mellitus without complications: Secondary | ICD-10-CM | POA: Diagnosis not present

## 2019-06-13 DIAGNOSIS — Z950 Presence of cardiac pacemaker: Secondary | ICD-10-CM | POA: Diagnosis not present

## 2019-06-13 DIAGNOSIS — I2510 Atherosclerotic heart disease of native coronary artery without angina pectoris: Secondary | ICD-10-CM | POA: Diagnosis not present

## 2019-06-13 DIAGNOSIS — E1140 Type 2 diabetes mellitus with diabetic neuropathy, unspecified: Secondary | ICD-10-CM | POA: Diagnosis not present

## 2019-06-13 DIAGNOSIS — I509 Heart failure, unspecified: Secondary | ICD-10-CM | POA: Diagnosis not present

## 2019-06-14 DIAGNOSIS — I5032 Chronic diastolic (congestive) heart failure: Secondary | ICD-10-CM | POA: Diagnosis not present

## 2019-06-14 DIAGNOSIS — I5033 Acute on chronic diastolic (congestive) heart failure: Secondary | ICD-10-CM | POA: Diagnosis not present

## 2019-06-15 DIAGNOSIS — I10 Essential (primary) hypertension: Secondary | ICD-10-CM | POA: Diagnosis not present

## 2019-06-15 DIAGNOSIS — I2510 Atherosclerotic heart disease of native coronary artery without angina pectoris: Secondary | ICD-10-CM | POA: Diagnosis not present

## 2019-06-15 DIAGNOSIS — I509 Heart failure, unspecified: Secondary | ICD-10-CM | POA: Diagnosis not present

## 2019-06-15 DIAGNOSIS — E119 Type 2 diabetes mellitus without complications: Secondary | ICD-10-CM | POA: Diagnosis not present

## 2019-06-15 DIAGNOSIS — E1140 Type 2 diabetes mellitus with diabetic neuropathy, unspecified: Secondary | ICD-10-CM | POA: Diagnosis not present

## 2019-06-15 DIAGNOSIS — R269 Unspecified abnormalities of gait and mobility: Secondary | ICD-10-CM | POA: Diagnosis not present

## 2019-06-15 DIAGNOSIS — G20 Parkinson's disease: Secondary | ICD-10-CM | POA: Diagnosis not present

## 2019-06-15 DIAGNOSIS — M6281 Muscle weakness (generalized): Secondary | ICD-10-CM | POA: Diagnosis not present

## 2019-06-15 DIAGNOSIS — Z950 Presence of cardiac pacemaker: Secondary | ICD-10-CM | POA: Diagnosis not present

## 2019-06-18 DIAGNOSIS — I509 Heart failure, unspecified: Secondary | ICD-10-CM | POA: Diagnosis not present

## 2019-06-18 DIAGNOSIS — E119 Type 2 diabetes mellitus without complications: Secondary | ICD-10-CM | POA: Diagnosis not present

## 2019-06-18 DIAGNOSIS — I10 Essential (primary) hypertension: Secondary | ICD-10-CM | POA: Diagnosis not present

## 2019-06-18 DIAGNOSIS — G20 Parkinson's disease: Secondary | ICD-10-CM | POA: Diagnosis not present

## 2019-06-18 DIAGNOSIS — I2510 Atherosclerotic heart disease of native coronary artery without angina pectoris: Secondary | ICD-10-CM | POA: Diagnosis not present

## 2019-06-18 DIAGNOSIS — R269 Unspecified abnormalities of gait and mobility: Secondary | ICD-10-CM | POA: Diagnosis not present

## 2019-06-18 DIAGNOSIS — M6281 Muscle weakness (generalized): Secondary | ICD-10-CM | POA: Diagnosis not present

## 2019-06-18 DIAGNOSIS — E1140 Type 2 diabetes mellitus with diabetic neuropathy, unspecified: Secondary | ICD-10-CM | POA: Diagnosis not present

## 2019-06-18 DIAGNOSIS — Z20828 Contact with and (suspected) exposure to other viral communicable diseases: Secondary | ICD-10-CM | POA: Diagnosis not present

## 2019-06-18 DIAGNOSIS — Z950 Presence of cardiac pacemaker: Secondary | ICD-10-CM | POA: Diagnosis not present

## 2019-06-18 DIAGNOSIS — Z1159 Encounter for screening for other viral diseases: Secondary | ICD-10-CM | POA: Diagnosis not present

## 2019-06-20 DIAGNOSIS — E119 Type 2 diabetes mellitus without complications: Secondary | ICD-10-CM | POA: Diagnosis not present

## 2019-06-20 DIAGNOSIS — G20 Parkinson's disease: Secondary | ICD-10-CM | POA: Diagnosis not present

## 2019-06-20 DIAGNOSIS — I509 Heart failure, unspecified: Secondary | ICD-10-CM | POA: Diagnosis not present

## 2019-06-20 DIAGNOSIS — Z950 Presence of cardiac pacemaker: Secondary | ICD-10-CM | POA: Diagnosis not present

## 2019-06-20 DIAGNOSIS — E1140 Type 2 diabetes mellitus with diabetic neuropathy, unspecified: Secondary | ICD-10-CM | POA: Diagnosis not present

## 2019-06-20 DIAGNOSIS — I2510 Atherosclerotic heart disease of native coronary artery without angina pectoris: Secondary | ICD-10-CM | POA: Diagnosis not present

## 2019-06-20 DIAGNOSIS — R269 Unspecified abnormalities of gait and mobility: Secondary | ICD-10-CM | POA: Diagnosis not present

## 2019-06-20 DIAGNOSIS — M6281 Muscle weakness (generalized): Secondary | ICD-10-CM | POA: Diagnosis not present

## 2019-06-20 DIAGNOSIS — I10 Essential (primary) hypertension: Secondary | ICD-10-CM | POA: Diagnosis not present

## 2019-06-21 DIAGNOSIS — Z950 Presence of cardiac pacemaker: Secondary | ICD-10-CM | POA: Diagnosis not present

## 2019-06-21 DIAGNOSIS — I2510 Atherosclerotic heart disease of native coronary artery without angina pectoris: Secondary | ICD-10-CM | POA: Diagnosis not present

## 2019-06-21 DIAGNOSIS — E119 Type 2 diabetes mellitus without complications: Secondary | ICD-10-CM | POA: Diagnosis not present

## 2019-06-21 DIAGNOSIS — I10 Essential (primary) hypertension: Secondary | ICD-10-CM | POA: Diagnosis not present

## 2019-06-21 DIAGNOSIS — M6281 Muscle weakness (generalized): Secondary | ICD-10-CM | POA: Diagnosis not present

## 2019-06-21 DIAGNOSIS — I509 Heart failure, unspecified: Secondary | ICD-10-CM | POA: Diagnosis not present

## 2019-06-21 DIAGNOSIS — G20 Parkinson's disease: Secondary | ICD-10-CM | POA: Diagnosis not present

## 2019-06-21 DIAGNOSIS — R269 Unspecified abnormalities of gait and mobility: Secondary | ICD-10-CM | POA: Diagnosis not present

## 2019-06-21 DIAGNOSIS — E1140 Type 2 diabetes mellitus with diabetic neuropathy, unspecified: Secondary | ICD-10-CM | POA: Diagnosis not present

## 2019-06-25 DIAGNOSIS — Z20828 Contact with and (suspected) exposure to other viral communicable diseases: Secondary | ICD-10-CM | POA: Diagnosis not present

## 2019-06-25 DIAGNOSIS — R269 Unspecified abnormalities of gait and mobility: Secondary | ICD-10-CM | POA: Diagnosis not present

## 2019-06-25 DIAGNOSIS — Z1159 Encounter for screening for other viral diseases: Secondary | ICD-10-CM | POA: Diagnosis not present

## 2019-06-25 DIAGNOSIS — Z950 Presence of cardiac pacemaker: Secondary | ICD-10-CM | POA: Diagnosis not present

## 2019-06-25 DIAGNOSIS — I2510 Atherosclerotic heart disease of native coronary artery without angina pectoris: Secondary | ICD-10-CM | POA: Diagnosis not present

## 2019-06-25 DIAGNOSIS — G20 Parkinson's disease: Secondary | ICD-10-CM | POA: Diagnosis not present

## 2019-06-25 DIAGNOSIS — M6281 Muscle weakness (generalized): Secondary | ICD-10-CM | POA: Diagnosis not present

## 2019-06-25 DIAGNOSIS — E119 Type 2 diabetes mellitus without complications: Secondary | ICD-10-CM | POA: Diagnosis not present

## 2019-06-25 DIAGNOSIS — I10 Essential (primary) hypertension: Secondary | ICD-10-CM | POA: Diagnosis not present

## 2019-06-25 DIAGNOSIS — I509 Heart failure, unspecified: Secondary | ICD-10-CM | POA: Diagnosis not present

## 2019-06-25 DIAGNOSIS — E1140 Type 2 diabetes mellitus with diabetic neuropathy, unspecified: Secondary | ICD-10-CM | POA: Diagnosis not present

## 2019-07-02 DIAGNOSIS — E119 Type 2 diabetes mellitus without complications: Secondary | ICD-10-CM | POA: Diagnosis not present

## 2019-07-02 DIAGNOSIS — M6281 Muscle weakness (generalized): Secondary | ICD-10-CM | POA: Diagnosis not present

## 2019-07-02 DIAGNOSIS — I10 Essential (primary) hypertension: Secondary | ICD-10-CM | POA: Diagnosis not present

## 2019-07-02 DIAGNOSIS — Z950 Presence of cardiac pacemaker: Secondary | ICD-10-CM | POA: Diagnosis not present

## 2019-07-02 DIAGNOSIS — R269 Unspecified abnormalities of gait and mobility: Secondary | ICD-10-CM | POA: Diagnosis not present

## 2019-07-02 DIAGNOSIS — I509 Heart failure, unspecified: Secondary | ICD-10-CM | POA: Diagnosis not present

## 2019-07-02 DIAGNOSIS — E1140 Type 2 diabetes mellitus with diabetic neuropathy, unspecified: Secondary | ICD-10-CM | POA: Diagnosis not present

## 2019-07-02 DIAGNOSIS — I2510 Atherosclerotic heart disease of native coronary artery without angina pectoris: Secondary | ICD-10-CM | POA: Diagnosis not present

## 2019-07-02 DIAGNOSIS — Z20828 Contact with and (suspected) exposure to other viral communicable diseases: Secondary | ICD-10-CM | POA: Diagnosis not present

## 2019-07-02 DIAGNOSIS — G20 Parkinson's disease: Secondary | ICD-10-CM | POA: Diagnosis not present

## 2019-07-02 DIAGNOSIS — Z1159 Encounter for screening for other viral diseases: Secondary | ICD-10-CM | POA: Diagnosis not present

## 2019-07-03 DIAGNOSIS — G20 Parkinson's disease: Secondary | ICD-10-CM | POA: Diagnosis not present

## 2019-07-05 DIAGNOSIS — M6281 Muscle weakness (generalized): Secondary | ICD-10-CM | POA: Diagnosis not present

## 2019-07-05 DIAGNOSIS — Z950 Presence of cardiac pacemaker: Secondary | ICD-10-CM | POA: Diagnosis not present

## 2019-07-05 DIAGNOSIS — I2510 Atherosclerotic heart disease of native coronary artery without angina pectoris: Secondary | ICD-10-CM | POA: Diagnosis not present

## 2019-07-05 DIAGNOSIS — G20 Parkinson's disease: Secondary | ICD-10-CM | POA: Diagnosis not present

## 2019-07-05 DIAGNOSIS — R269 Unspecified abnormalities of gait and mobility: Secondary | ICD-10-CM | POA: Diagnosis not present

## 2019-07-05 DIAGNOSIS — E119 Type 2 diabetes mellitus without complications: Secondary | ICD-10-CM | POA: Diagnosis not present

## 2019-07-05 DIAGNOSIS — E1140 Type 2 diabetes mellitus with diabetic neuropathy, unspecified: Secondary | ICD-10-CM | POA: Diagnosis not present

## 2019-07-05 DIAGNOSIS — I10 Essential (primary) hypertension: Secondary | ICD-10-CM | POA: Diagnosis not present

## 2019-07-05 DIAGNOSIS — I509 Heart failure, unspecified: Secondary | ICD-10-CM | POA: Diagnosis not present

## 2019-07-07 DIAGNOSIS — R042 Hemoptysis: Secondary | ICD-10-CM | POA: Diagnosis not present

## 2019-07-09 ENCOUNTER — Ambulatory Visit (INDEPENDENT_AMBULATORY_CARE_PROVIDER_SITE_OTHER): Payer: Medicare HMO | Admitting: *Deleted

## 2019-07-09 DIAGNOSIS — M6281 Muscle weakness (generalized): Secondary | ICD-10-CM | POA: Diagnosis not present

## 2019-07-09 DIAGNOSIS — I10 Essential (primary) hypertension: Secondary | ICD-10-CM | POA: Diagnosis not present

## 2019-07-09 DIAGNOSIS — I509 Heart failure, unspecified: Secondary | ICD-10-CM | POA: Diagnosis not present

## 2019-07-09 DIAGNOSIS — I442 Atrioventricular block, complete: Secondary | ICD-10-CM

## 2019-07-09 DIAGNOSIS — G20 Parkinson's disease: Secondary | ICD-10-CM | POA: Diagnosis not present

## 2019-07-09 DIAGNOSIS — E119 Type 2 diabetes mellitus without complications: Secondary | ICD-10-CM | POA: Diagnosis not present

## 2019-07-09 DIAGNOSIS — Z950 Presence of cardiac pacemaker: Secondary | ICD-10-CM | POA: Diagnosis not present

## 2019-07-09 DIAGNOSIS — E1140 Type 2 diabetes mellitus with diabetic neuropathy, unspecified: Secondary | ICD-10-CM | POA: Diagnosis not present

## 2019-07-09 DIAGNOSIS — I2510 Atherosclerotic heart disease of native coronary artery without angina pectoris: Secondary | ICD-10-CM | POA: Diagnosis not present

## 2019-07-09 DIAGNOSIS — R269 Unspecified abnormalities of gait and mobility: Secondary | ICD-10-CM | POA: Diagnosis not present

## 2019-07-09 LAB — CUP PACEART REMOTE DEVICE CHECK
Battery Remaining Longevity: 109 mo
Battery Voltage: 3 V
Brady Statistic AP VP Percent: 0 %
Brady Statistic AP VS Percent: 0 %
Brady Statistic AS VP Percent: 96.05 %
Brady Statistic AS VS Percent: 3.95 %
Brady Statistic RA Percent Paced: 0 %
Brady Statistic RV Percent Paced: 96.05 %
Date Time Interrogation Session: 20210301003950
Implantable Lead Implant Date: 20080804
Implantable Lead Implant Date: 20080804
Implantable Lead Location: 753859
Implantable Lead Location: 753860
Implantable Lead Model: 5076
Implantable Lead Model: 5076
Implantable Pulse Generator Implant Date: 20180926
Lead Channel Impedance Value: 304 Ohm
Lead Channel Impedance Value: 342 Ohm
Lead Channel Impedance Value: 342 Ohm
Lead Channel Impedance Value: 418 Ohm
Lead Channel Pacing Threshold Amplitude: 0.75 V
Lead Channel Pacing Threshold Pulse Width: 0.4 ms
Lead Channel Sensing Intrinsic Amplitude: 8.125 mV
Lead Channel Sensing Intrinsic Amplitude: 8.125 mV
Lead Channel Setting Pacing Amplitude: 2.5 V
Lead Channel Setting Pacing Pulse Width: 0.4 ms
Lead Channel Setting Sensing Sensitivity: 4 mV

## 2019-07-09 NOTE — Progress Notes (Signed)
PPM Remote  

## 2019-07-10 ENCOUNTER — Telehealth: Payer: Self-pay | Admitting: Diagnostic Neuroimaging

## 2019-07-10 NOTE — Telephone Encounter (Signed)
Pt's wife called wanting to discuss with RN Long term disability and a FL2 form to see if the provider will fill it out for the pt. Please advise.

## 2019-07-10 NOTE — Telephone Encounter (Signed)
Called [name] Powell who stated their long term disability was denied because they live in independent living. She stated they now have round the clock care for patient but are being denied. [name], their son-in-law is [facility] POA. He is going to file FL2 and try to get disability approved. She would like Dr Leta Baptist  To complete FL2 and have it E mailed to [email or web address]. I advised her Dr Stark Klein will fill out FL2 and I will ask medical records ot scan and e mail to [facility]. She would like return call when this has been done. I advised I'll call her. She  verbalized understanding, appreciation.

## 2019-07-11 NOTE — Telephone Encounter (Signed)
Called wife to complete FL2 patient information section. Completed, signed by Dr Leta Baptist and sent to Med records to be e mailed to [facility] as wife requested. Angela Nevin, med rec verbally notified.

## 2019-07-12 DIAGNOSIS — I509 Heart failure, unspecified: Secondary | ICD-10-CM | POA: Diagnosis not present

## 2019-07-12 DIAGNOSIS — E1140 Type 2 diabetes mellitus with diabetic neuropathy, unspecified: Secondary | ICD-10-CM | POA: Diagnosis not present

## 2019-07-12 DIAGNOSIS — I2510 Atherosclerotic heart disease of native coronary artery without angina pectoris: Secondary | ICD-10-CM | POA: Diagnosis not present

## 2019-07-12 DIAGNOSIS — G20 Parkinson's disease: Secondary | ICD-10-CM | POA: Diagnosis not present

## 2019-07-12 DIAGNOSIS — I5032 Chronic diastolic (congestive) heart failure: Secondary | ICD-10-CM | POA: Diagnosis not present

## 2019-07-12 DIAGNOSIS — M6281 Muscle weakness (generalized): Secondary | ICD-10-CM | POA: Diagnosis not present

## 2019-07-12 DIAGNOSIS — R269 Unspecified abnormalities of gait and mobility: Secondary | ICD-10-CM | POA: Diagnosis not present

## 2019-07-12 DIAGNOSIS — I5033 Acute on chronic diastolic (congestive) heart failure: Secondary | ICD-10-CM | POA: Diagnosis not present

## 2019-07-12 DIAGNOSIS — Z950 Presence of cardiac pacemaker: Secondary | ICD-10-CM | POA: Diagnosis not present

## 2019-07-12 DIAGNOSIS — I10 Essential (primary) hypertension: Secondary | ICD-10-CM | POA: Diagnosis not present

## 2019-07-12 DIAGNOSIS — E119 Type 2 diabetes mellitus without complications: Secondary | ICD-10-CM | POA: Diagnosis not present

## 2019-07-17 DIAGNOSIS — I509 Heart failure, unspecified: Secondary | ICD-10-CM | POA: Diagnosis not present

## 2019-07-17 DIAGNOSIS — E1140 Type 2 diabetes mellitus with diabetic neuropathy, unspecified: Secondary | ICD-10-CM | POA: Diagnosis not present

## 2019-07-17 DIAGNOSIS — R269 Unspecified abnormalities of gait and mobility: Secondary | ICD-10-CM | POA: Diagnosis not present

## 2019-07-17 DIAGNOSIS — Z950 Presence of cardiac pacemaker: Secondary | ICD-10-CM | POA: Diagnosis not present

## 2019-07-17 DIAGNOSIS — M6281 Muscle weakness (generalized): Secondary | ICD-10-CM | POA: Diagnosis not present

## 2019-07-17 DIAGNOSIS — G20 Parkinson's disease: Secondary | ICD-10-CM | POA: Diagnosis not present

## 2019-07-17 DIAGNOSIS — E119 Type 2 diabetes mellitus without complications: Secondary | ICD-10-CM | POA: Diagnosis not present

## 2019-07-17 DIAGNOSIS — I2510 Atherosclerotic heart disease of native coronary artery without angina pectoris: Secondary | ICD-10-CM | POA: Diagnosis not present

## 2019-07-17 DIAGNOSIS — I10 Essential (primary) hypertension: Secondary | ICD-10-CM | POA: Diagnosis not present

## 2019-07-20 DIAGNOSIS — I2510 Atherosclerotic heart disease of native coronary artery without angina pectoris: Secondary | ICD-10-CM | POA: Diagnosis not present

## 2019-07-20 DIAGNOSIS — E1140 Type 2 diabetes mellitus with diabetic neuropathy, unspecified: Secondary | ICD-10-CM | POA: Diagnosis not present

## 2019-07-20 DIAGNOSIS — Z950 Presence of cardiac pacemaker: Secondary | ICD-10-CM | POA: Diagnosis not present

## 2019-07-20 DIAGNOSIS — I509 Heart failure, unspecified: Secondary | ICD-10-CM | POA: Diagnosis not present

## 2019-07-20 DIAGNOSIS — E119 Type 2 diabetes mellitus without complications: Secondary | ICD-10-CM | POA: Diagnosis not present

## 2019-07-20 DIAGNOSIS — R269 Unspecified abnormalities of gait and mobility: Secondary | ICD-10-CM | POA: Diagnosis not present

## 2019-07-20 DIAGNOSIS — G20 Parkinson's disease: Secondary | ICD-10-CM | POA: Diagnosis not present

## 2019-07-20 DIAGNOSIS — M6281 Muscle weakness (generalized): Secondary | ICD-10-CM | POA: Diagnosis not present

## 2019-07-20 DIAGNOSIS — I10 Essential (primary) hypertension: Secondary | ICD-10-CM | POA: Diagnosis not present

## 2019-07-22 DIAGNOSIS — M6281 Muscle weakness (generalized): Secondary | ICD-10-CM | POA: Diagnosis not present

## 2019-07-22 DIAGNOSIS — I2510 Atherosclerotic heart disease of native coronary artery without angina pectoris: Secondary | ICD-10-CM | POA: Diagnosis not present

## 2019-07-22 DIAGNOSIS — E119 Type 2 diabetes mellitus without complications: Secondary | ICD-10-CM | POA: Diagnosis not present

## 2019-07-22 DIAGNOSIS — R269 Unspecified abnormalities of gait and mobility: Secondary | ICD-10-CM | POA: Diagnosis not present

## 2019-07-22 DIAGNOSIS — G20 Parkinson's disease: Secondary | ICD-10-CM | POA: Diagnosis not present

## 2019-07-22 DIAGNOSIS — E1140 Type 2 diabetes mellitus with diabetic neuropathy, unspecified: Secondary | ICD-10-CM | POA: Diagnosis not present

## 2019-07-22 DIAGNOSIS — I509 Heart failure, unspecified: Secondary | ICD-10-CM | POA: Diagnosis not present

## 2019-07-22 DIAGNOSIS — I10 Essential (primary) hypertension: Secondary | ICD-10-CM | POA: Diagnosis not present

## 2019-07-22 DIAGNOSIS — Z950 Presence of cardiac pacemaker: Secondary | ICD-10-CM | POA: Diagnosis not present

## 2019-07-23 ENCOUNTER — Telehealth: Payer: Self-pay | Admitting: *Deleted

## 2019-07-23 NOTE — Telephone Encounter (Signed)
I emailed FL2 form. [name] Powell need a return phone call to discuss some questions on the [facility] form. Please call [phone]

## 2019-07-23 NOTE — Telephone Encounter (Signed)
See phone note dated 07/10/19.

## 2019-07-23 NOTE — Telephone Encounter (Addendum)
Received message today: I emailed FL2 form. Clair Gulling need a return phone call to discuss some questions on the [facility] form. Please call [phone]. Called # listed, spoke with Clair Gulling,  [place] who stated that the [facility] 2 form needs some additions and a correction in section #10. He will fax to me with note as to changes, additions.  Received fax and called Clair Gulling, made requested changes and faxed back to [place], [phone].

## 2019-07-24 DIAGNOSIS — G20 Parkinson's disease: Secondary | ICD-10-CM | POA: Diagnosis not present

## 2019-07-24 DIAGNOSIS — E1140 Type 2 diabetes mellitus with diabetic neuropathy, unspecified: Secondary | ICD-10-CM | POA: Diagnosis not present

## 2019-07-24 DIAGNOSIS — E119 Type 2 diabetes mellitus without complications: Secondary | ICD-10-CM | POA: Diagnosis not present

## 2019-07-24 DIAGNOSIS — Z950 Presence of cardiac pacemaker: Secondary | ICD-10-CM | POA: Diagnosis not present

## 2019-07-24 DIAGNOSIS — M6281 Muscle weakness (generalized): Secondary | ICD-10-CM | POA: Diagnosis not present

## 2019-07-24 DIAGNOSIS — I2510 Atherosclerotic heart disease of native coronary artery without angina pectoris: Secondary | ICD-10-CM | POA: Diagnosis not present

## 2019-07-24 DIAGNOSIS — I10 Essential (primary) hypertension: Secondary | ICD-10-CM | POA: Diagnosis not present

## 2019-07-24 DIAGNOSIS — R269 Unspecified abnormalities of gait and mobility: Secondary | ICD-10-CM | POA: Diagnosis not present

## 2019-07-24 DIAGNOSIS — I509 Heart failure, unspecified: Secondary | ICD-10-CM | POA: Diagnosis not present

## 2019-07-30 DIAGNOSIS — I509 Heart failure, unspecified: Secondary | ICD-10-CM | POA: Diagnosis not present

## 2019-07-30 DIAGNOSIS — Z950 Presence of cardiac pacemaker: Secondary | ICD-10-CM | POA: Diagnosis not present

## 2019-07-30 DIAGNOSIS — E1140 Type 2 diabetes mellitus with diabetic neuropathy, unspecified: Secondary | ICD-10-CM | POA: Diagnosis not present

## 2019-07-30 DIAGNOSIS — E119 Type 2 diabetes mellitus without complications: Secondary | ICD-10-CM | POA: Diagnosis not present

## 2019-07-30 DIAGNOSIS — I10 Essential (primary) hypertension: Secondary | ICD-10-CM | POA: Diagnosis not present

## 2019-07-30 DIAGNOSIS — I2510 Atherosclerotic heart disease of native coronary artery without angina pectoris: Secondary | ICD-10-CM | POA: Diagnosis not present

## 2019-07-30 DIAGNOSIS — G20 Parkinson's disease: Secondary | ICD-10-CM | POA: Diagnosis not present

## 2019-07-30 DIAGNOSIS — M6281 Muscle weakness (generalized): Secondary | ICD-10-CM | POA: Diagnosis not present

## 2019-07-30 DIAGNOSIS — R269 Unspecified abnormalities of gait and mobility: Secondary | ICD-10-CM | POA: Diagnosis not present

## 2019-07-31 DIAGNOSIS — G20 Parkinson's disease: Secondary | ICD-10-CM | POA: Diagnosis not present

## 2019-08-02 DIAGNOSIS — M6281 Muscle weakness (generalized): Secondary | ICD-10-CM | POA: Diagnosis not present

## 2019-08-02 DIAGNOSIS — G20 Parkinson's disease: Secondary | ICD-10-CM | POA: Diagnosis not present

## 2019-08-02 DIAGNOSIS — E1140 Type 2 diabetes mellitus with diabetic neuropathy, unspecified: Secondary | ICD-10-CM | POA: Diagnosis not present

## 2019-08-02 DIAGNOSIS — R269 Unspecified abnormalities of gait and mobility: Secondary | ICD-10-CM | POA: Diagnosis not present

## 2019-08-02 DIAGNOSIS — I10 Essential (primary) hypertension: Secondary | ICD-10-CM | POA: Diagnosis not present

## 2019-08-02 DIAGNOSIS — Z950 Presence of cardiac pacemaker: Secondary | ICD-10-CM | POA: Diagnosis not present

## 2019-08-02 DIAGNOSIS — I2510 Atherosclerotic heart disease of native coronary artery without angina pectoris: Secondary | ICD-10-CM | POA: Diagnosis not present

## 2019-08-02 DIAGNOSIS — I509 Heart failure, unspecified: Secondary | ICD-10-CM | POA: Diagnosis not present

## 2019-08-02 DIAGNOSIS — E119 Type 2 diabetes mellitus without complications: Secondary | ICD-10-CM | POA: Diagnosis not present

## 2019-08-06 ENCOUNTER — Emergency Department (HOSPITAL_COMMUNITY)
Admission: EM | Admit: 2019-08-06 | Discharge: 2019-08-06 | Disposition: A | Discharge status: Home / Self Care | Payer: Medicare HMO | Attending: Emergency Medicine | Admitting: Emergency Medicine

## 2019-08-06 ENCOUNTER — Other Ambulatory Visit: Payer: Self-pay

## 2019-08-06 ENCOUNTER — Emergency Department (HOSPITAL_COMMUNITY): Payer: Medicare HMO

## 2019-08-06 ENCOUNTER — Telehealth: Payer: Self-pay | Admitting: Cardiovascular Disease

## 2019-08-06 DIAGNOSIS — N183 Chronic kidney disease, stage 3 unspecified: Secondary | ICD-10-CM | POA: Insufficient documentation

## 2019-08-06 DIAGNOSIS — I5032 Chronic diastolic (congestive) heart failure: Secondary | ICD-10-CM | POA: Insufficient documentation

## 2019-08-06 DIAGNOSIS — Z950 Presence of cardiac pacemaker: Secondary | ICD-10-CM | POA: Insufficient documentation

## 2019-08-06 DIAGNOSIS — W01198A Fall on same level from slipping, tripping and stumbling with subsequent striking against other object, initial encounter: Secondary | ICD-10-CM | POA: Insufficient documentation

## 2019-08-06 DIAGNOSIS — I4891 Unspecified atrial fibrillation: Secondary | ICD-10-CM | POA: Diagnosis not present

## 2019-08-06 DIAGNOSIS — W19XXXA Unspecified fall, initial encounter: Secondary | ICD-10-CM | POA: Diagnosis not present

## 2019-08-06 DIAGNOSIS — Z79899 Other long term (current) drug therapy: Secondary | ICD-10-CM | POA: Diagnosis not present

## 2019-08-06 DIAGNOSIS — F0390 Unspecified dementia without behavioral disturbance: Secondary | ICD-10-CM | POA: Insufficient documentation

## 2019-08-06 DIAGNOSIS — M542 Cervicalgia: Secondary | ICD-10-CM | POA: Diagnosis not present

## 2019-08-06 DIAGNOSIS — Z951 Presence of aortocoronary bypass graft: Secondary | ICD-10-CM | POA: Diagnosis not present

## 2019-08-06 DIAGNOSIS — R519 Headache, unspecified: Secondary | ICD-10-CM | POA: Insufficient documentation

## 2019-08-06 DIAGNOSIS — I130 Hypertensive heart and chronic kidney disease with heart failure and stage 1 through stage 4 chronic kidney disease, or unspecified chronic kidney disease: Secondary | ICD-10-CM | POA: Diagnosis not present

## 2019-08-06 DIAGNOSIS — Y999 Unspecified external cause status: Secondary | ICD-10-CM | POA: Insufficient documentation

## 2019-08-06 DIAGNOSIS — I2510 Atherosclerotic heart disease of native coronary artery without angina pectoris: Secondary | ICD-10-CM | POA: Insufficient documentation

## 2019-08-06 DIAGNOSIS — Y9301 Activity, walking, marching and hiking: Secondary | ICD-10-CM | POA: Insufficient documentation

## 2019-08-06 DIAGNOSIS — R410 Disorientation, unspecified: Secondary | ICD-10-CM | POA: Diagnosis not present

## 2019-08-06 DIAGNOSIS — G20 Parkinson's disease: Secondary | ICD-10-CM | POA: Insufficient documentation

## 2019-08-06 DIAGNOSIS — Y92019 Unspecified place in single-family (private) house as the place of occurrence of the external cause: Secondary | ICD-10-CM | POA: Diagnosis not present

## 2019-08-06 DIAGNOSIS — I509 Heart failure, unspecified: Secondary | ICD-10-CM | POA: Diagnosis not present

## 2019-08-06 DIAGNOSIS — M6281 Muscle weakness (generalized): Secondary | ICD-10-CM | POA: Diagnosis not present

## 2019-08-06 DIAGNOSIS — Z8546 Personal history of malignant neoplasm of prostate: Secondary | ICD-10-CM | POA: Insufficient documentation

## 2019-08-06 DIAGNOSIS — R0689 Other abnormalities of breathing: Secondary | ICD-10-CM | POA: Diagnosis not present

## 2019-08-06 DIAGNOSIS — R269 Unspecified abnormalities of gait and mobility: Secondary | ICD-10-CM | POA: Diagnosis not present

## 2019-08-06 DIAGNOSIS — S199XXA Unspecified injury of neck, initial encounter: Secondary | ICD-10-CM | POA: Diagnosis not present

## 2019-08-06 DIAGNOSIS — E119 Type 2 diabetes mellitus without complications: Secondary | ICD-10-CM | POA: Diagnosis not present

## 2019-08-06 DIAGNOSIS — S0083XA Contusion of other part of head, initial encounter: Secondary | ICD-10-CM | POA: Insufficient documentation

## 2019-08-06 DIAGNOSIS — S0990XA Unspecified injury of head, initial encounter: Secondary | ICD-10-CM | POA: Diagnosis not present

## 2019-08-06 DIAGNOSIS — R4020 Unspecified coma: Secondary | ICD-10-CM | POA: Diagnosis not present

## 2019-08-06 DIAGNOSIS — Z7401 Bed confinement status: Secondary | ICD-10-CM | POA: Diagnosis not present

## 2019-08-06 DIAGNOSIS — I10 Essential (primary) hypertension: Secondary | ICD-10-CM | POA: Diagnosis not present

## 2019-08-06 DIAGNOSIS — E1140 Type 2 diabetes mellitus with diabetic neuropathy, unspecified: Secondary | ICD-10-CM | POA: Diagnosis not present

## 2019-08-06 DIAGNOSIS — M2550 Pain in unspecified joint: Secondary | ICD-10-CM | POA: Diagnosis not present

## 2019-08-06 NOTE — Telephone Encounter (Signed)
   Pt c/o medication issue:  1. Name of Medication: Eliquis  2. How are you currently taking this medication (dosage and times per day)?   3. Are you having a reaction (difficulty breathing--STAT)?   4. What is your medication issue? Pt's wife said when pt was hospitalized on 05/28/19 the doctor in the ED stopped huis eliquis, she wanted to know Dr. Sallyanne Kuster if that's ok  Please advise

## 2019-08-06 NOTE — Telephone Encounter (Signed)
Will route to MD to advise on the discontinued Eliquis from hospital. Thanks!

## 2019-08-06 NOTE — Telephone Encounter (Signed)
Contacted patient- gave advice from Dr.C. Patient wife verbalized understanding.

## 2019-08-06 NOTE — ED Triage Notes (Signed)
BIB via EMS. Per ems, pt fell into side of wall and struck head today. Unknown LOC. Visible frontal swelling/bruising. Pt follows commands, but isn't verbalizing. Hx of dementia/parkinsons. - blood thinners. C-collar in place.

## 2019-08-06 NOTE — ED Notes (Signed)
Ptar called for pt 

## 2019-08-06 NOTE — ED Notes (Signed)
Skin tear on left elbow flushed with NS. Nonadherent gauze, surgifoam applied then wrapped in gauze bandage. No active bleeding at this time. Frontal abrasion cleaned with NS and bacitracin applied.

## 2019-08-06 NOTE — Discharge Instructions (Signed)
Return if any problems.

## 2019-08-06 NOTE — ED Provider Notes (Signed)
[facility] EMERGENCY DEPARTMENT Provider Note   CSN: [id number] Arrival date & time: 08/06/19  1749     History Chief Complaint  Patient presents with  . Fall    [name] Powell is a 86 y.o. male.  Pt hit the right side of his face.  Pt is not currently on blood thinners.    The history is provided by the patient. No language interpreter was used.  Fall This is a new problem. The current episode started less than 1 hour ago. The problem occurs constantly. Associated symptoms include headaches. Nothing aggravates the symptoms. Nothing relieves the symptoms. He has tried nothing for the symptoms. The treatment provided no relief.   Pt's wife reports pt was trying to walk to get grapes and fell hitting his head. Pt's wife reports he did not move for several minutes.  Pt is frequently sleepy due to dementia     Past Medical History:  Diagnosis Date  . Anemia    chronic  . Atrial fibrillation ([place])   . Cancer ([place])   . CHB (complete heart block) ([place])   . Coronary artery disease   . Diabetes mellitus   . Dyslipidemia   . H/O prostate cancer   . Hypertension   . New onset atrial flutter, persistent 07/04/2014  . Pacemaker 12/12/2006   Medtronic adapta  . Pleural effusion, left   . Pneumonia 09/2015  . Prostate cancer ([place])   . 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 Ventricular tachycardia (paroxysmal) ([place]) 03/28/2014    Patient Active Problem List   Diagnosis Date Noted  . Palliative care by specialist   . Goals of care, counseling/discussion   . HCAP (healthcare-associated pneumonia) 05/28/2019  . AKI (acute kidney injury) ([place]) 05/28/2019  . Right hip pain 05/28/2019  . Macrocytic anemia 04/09/2019  . CAP (community acquired pneumonia) 02/23/2019  . Parkinson's disease ([place]) 02/22/2019  . Acute encephalopathy 02/22/2019  . Symptomatic anemia 10/23/2018  . Pacemaker battery depletion  02/02/2017  . CHF (congestive heart failure) ([place]) 07/27/2016  . Weakness generalized 07/27/2016  . Generalized weakness 07/27/2016  . Long term current use of anticoagulant 01/16/2016  . Acute respiratory failure with hypoxia ([place]) 09/15/2015  . Acute on chronic diastolic congestive heart failure ([place]) 09/15/2015  . CKD (chronic kidney disease), stage III ([place]) 09/15/2015  . Atrial fibrillation ([place])   . Pleural effusion, left   . Acute diastolic (congestive) heart failure ([place])   . Community acquired pneumonia 02/19/2015  . Atrial flutter ([place]) 01/20/2015  . New onset atrial flutter, persistent 07/04/2014  . Ventricular tachycardia (paroxysmal) ([place]) 03/28/2014  . Chronic diastolic congestive heart failure ([place]) 01/02/2014  . Pacemaker dependent - Medtronic 01/10/2013  . CAD s/p CABG 2003 01/10/2013  . Sinus arrest 01/10/2013  . CHB (complete heart block) ([place]) 01/10/2013  . DM2 (diabetes mellitus, type 2) ([place]) 01/10/2013  . Dyslipidemia 01/10/2013  . HTN (hypertension) 01/10/2013    Past Surgical History:  Procedure Laterality Date  . CORONARY ARTERY BYPASS GRAFT  09/04/2001   LIMA to LAD,SVG to diagonal,SVG to ramus intermedius,SVG to PDA  . NM MYOVIEW LTD  01/09/2010   no ischemia  . PACEMAKER INSERTION  12/12/2006   Medtronic adapta  . PPM GENERATOR CHANGEOUT N/A 02/02/2017   Procedure: PPM GENERATOR CHANGEOUT - DUAL CHAMBER;  Surgeon: [name], MD;  Location: [facility];  Service: Cardiovascular;  Laterality: N/A;  .  PROSTATE SURGERY  2001   cancer  . TONSILLECTOMY         Family History  Problem Relation Age of Onset  . Heart disease Mother   . Heart attack Mother   . Other Father        sepsis  . Other Sister        brain tumor    Social History   Tobacco Use  . Smoking status: Former Smoker    Types: Cigarettes, Cigars    Quit date: 05/14/1962    Years since quitting: 57.2  . Smokeless tobacco: Never Used  Substance Use Topics  . Alcohol  use: Yes    Comment: seldom  . Drug use: No    Home Medications Prior to Admission medications   Medication Sig Start Date End Date Taking? Authorizing Provider  acetaminophen (TYLENOL) 650 MG CR tablet Take 650 mg by mouth every 8 (eight) hours as needed for pain.   Yes [provider]  atorvastatin (LIPITOR) 20 MG tablet Take 20 mg by mouth daily.   Yes [provider]  carbidopa-levodopa (SINEMET IR) 25-100 MG tablet Take 1 tablet by mouth 3 (three) times daily. 01/03/19  Yes Penumalli, Earlean Polka, MD  Cholecalciferol (VITAMIN D PO) Take 10,000 Units by mouth every Friday. On Friday    Yes [provider]  furosemide (LASIX) 40 MG tablet Take 40 mg by mouth daily.   Yes [provider]  JANUVIA 50 MG tablet Take 50 mg by mouth every evening.  05/27/15  Yes [provider]  metoprolol tartrate (LOPRESSOR) 50 MG tablet TAKE 2 TABLETS IN THE MORNING  AND TAKE 1 TABLET IN THE EVENING Patient taking differently: Take 50-100 mg by mouth See admin instructions. 100mg  in am & 50mg  in evening. 03/09/19  Yes Croitoru, Mihai, MD  Multiple Vitamins-Minerals (PRESERVISION AREDS) CAPS Take 1 capsule by mouth 2 (two) times daily.   Yes [provider]  polyethylene glycol (MIRALAX / GLYCOLAX) packet Take 17 g by mouth daily. To prevent constipation    Yes [provider]  QUEtiapine (SEROQUEL) 25 MG tablet Take 12.5 mg by mouth at bedtime.   Yes [provider]  vitamin B-12 (CYANOCOBALAMIN) 1000 MCG tablet Take 1,000 mcg by mouth daily.   Yes [provider]  QUEtiapine (SEROQUEL) 25 MG tablet Take 0.5 tablets (12.5 mg total) by mouth at bedtime. 03/01/19 05/28/19  Donne Hazel, MD    Allergies    Milk-related compounds, Lanoxin [digoxin], and Mexitil [mexiletine]  Review of Systems   Review of Systems  Neurological: Positive for headaches.  All other systems reviewed and are negative.   Physical Exam Updated Vital  Signs BP (!) 179/81 (BP Location: Right Arm)   Pulse 60   Resp 16   Ht 6' (1.829 m)   Wt 74.8 kg   SpO2 100%   BMI 22.38 kg/m   Physical Exam Vitals and nursing note reviewed.  Constitutional:      Appearance: He is well-developed.  HENT:     Head: Normocephalic.     Comments: Swollen area left face,  Tender to touch nv and ans intact  Eyes:     Conjunctiva/sclera: Conjunctivae normal.  Cardiovascular:     Rate and Rhythm: Normal rate and regular rhythm.     Heart sounds: No murmur.  Pulmonary:     Effort: Pulmonary effort is normal. No respiratory distress.     Breath sounds: Normal breath sounds.  Abdominal:  Palpations: Abdomen is soft.     Tenderness: There is no abdominal tenderness.  Musculoskeletal:        General: Normal range of motion.     Cervical back: Neck supple.  Skin:    General: Skin is warm and dry.  Neurological:     General: No focal deficit present.     Mental Status: He is alert.  Psychiatric:        Mood and Affect: Mood normal.     ED Results / Procedures / Treatments   Labs (all labs ordered are listed, but only abnormal results are displayed) Labs Reviewed - No data to display  EKG EKG Interpretation  Date/Time:  Monday August 06 2019 18:13:14 EDT Ventricular Rate:  60 PR Interval:    QRS Duration: 188 QT Interval:  509 QTC Calculation: 509 R Axis:   -80 Text Interpretation: Atrial fibrillation LVH with IVCD, LAD and secondary repol abnrm Anterolateral infarct, old Prolonged QT interval Baseline wander in lead(s) V3 Confirmed by Nat Christen [phone]) on 08/06/2019 6:17:26 PM   Radiology No results found.  Procedures Procedures (including critical care time)  Medications Ordered in ED Medications - No data to display  ED Course  I have reviewed the triage vital signs and the nursing notes.  Pertinent labs & imaging results that were available during my care of the patient were reviewed by me and considered in my medical  decision making (see chart for details).    MDM Rules/Calculators/A&P                       Final Clinical Impression(s) / ED Diagnoses Final diagnoses:  Contusion of face, initial encounter    Rx / DC Orders ED Discharge Orders    None    An After Visit Summary was printed and given to the patient.    Sidney Ace 08/06/19 2055    Nat Christen, MD 08/09/19 0900

## 2019-08-06 NOTE — Telephone Encounter (Signed)
According to DC summary:  "Significant blood loss, recurrent fall.  The risks of continuing anticoagulation outweigh any benefits.  This was discussed with the patient and her his wife.  Will discontinue Eliquis altogether. "  Unless he has an embolic event such as a stroke or TIA, that is probably the best approach. He should call if he has any neurological events.

## 2019-08-09 DIAGNOSIS — I2510 Atherosclerotic heart disease of native coronary artery without angina pectoris: Secondary | ICD-10-CM | POA: Diagnosis not present

## 2019-08-09 DIAGNOSIS — G20 Parkinson's disease: Secondary | ICD-10-CM | POA: Diagnosis not present

## 2019-08-09 DIAGNOSIS — Z950 Presence of cardiac pacemaker: Secondary | ICD-10-CM | POA: Diagnosis not present

## 2019-08-09 DIAGNOSIS — I10 Essential (primary) hypertension: Secondary | ICD-10-CM | POA: Diagnosis not present

## 2019-08-09 DIAGNOSIS — R269 Unspecified abnormalities of gait and mobility: Secondary | ICD-10-CM | POA: Diagnosis not present

## 2019-08-09 DIAGNOSIS — E119 Type 2 diabetes mellitus without complications: Secondary | ICD-10-CM | POA: Diagnosis not present

## 2019-08-09 DIAGNOSIS — M6281 Muscle weakness (generalized): Secondary | ICD-10-CM | POA: Diagnosis not present

## 2019-08-09 DIAGNOSIS — I509 Heart failure, unspecified: Secondary | ICD-10-CM | POA: Diagnosis not present

## 2019-08-09 DIAGNOSIS — E1140 Type 2 diabetes mellitus with diabetic neuropathy, unspecified: Secondary | ICD-10-CM | POA: Diagnosis not present

## 2019-08-12 DIAGNOSIS — I5033 Acute on chronic diastolic (congestive) heart failure: Secondary | ICD-10-CM | POA: Diagnosis not present

## 2019-08-12 DIAGNOSIS — I5032 Chronic diastolic (congestive) heart failure: Secondary | ICD-10-CM | POA: Diagnosis not present

## 2019-08-13 DIAGNOSIS — E782 Mixed hyperlipidemia: Secondary | ICD-10-CM | POA: Diagnosis not present

## 2019-08-13 DIAGNOSIS — E1165 Type 2 diabetes mellitus with hyperglycemia: Secondary | ICD-10-CM | POA: Diagnosis not present

## 2019-08-13 DIAGNOSIS — N1832 Chronic kidney disease, stage 3b: Secondary | ICD-10-CM | POA: Diagnosis not present

## 2019-08-13 DIAGNOSIS — D649 Anemia, unspecified: Secondary | ICD-10-CM | POA: Diagnosis not present

## 2019-08-13 DIAGNOSIS — C61 Malignant neoplasm of prostate: Secondary | ICD-10-CM | POA: Diagnosis not present

## 2019-08-14 DIAGNOSIS — E119 Type 2 diabetes mellitus without complications: Secondary | ICD-10-CM | POA: Diagnosis not present

## 2019-08-14 DIAGNOSIS — I509 Heart failure, unspecified: Secondary | ICD-10-CM | POA: Diagnosis not present

## 2019-08-14 DIAGNOSIS — I10 Essential (primary) hypertension: Secondary | ICD-10-CM | POA: Diagnosis not present

## 2019-08-14 DIAGNOSIS — R269 Unspecified abnormalities of gait and mobility: Secondary | ICD-10-CM | POA: Diagnosis not present

## 2019-08-14 DIAGNOSIS — I2510 Atherosclerotic heart disease of native coronary artery without angina pectoris: Secondary | ICD-10-CM | POA: Diagnosis not present

## 2019-08-14 DIAGNOSIS — M6281 Muscle weakness (generalized): Secondary | ICD-10-CM | POA: Diagnosis not present

## 2019-08-14 DIAGNOSIS — Z950 Presence of cardiac pacemaker: Secondary | ICD-10-CM | POA: Diagnosis not present

## 2019-08-14 DIAGNOSIS — E1140 Type 2 diabetes mellitus with diabetic neuropathy, unspecified: Secondary | ICD-10-CM | POA: Diagnosis not present

## 2019-08-14 DIAGNOSIS — G20 Parkinson's disease: Secondary | ICD-10-CM | POA: Diagnosis not present

## 2019-08-16 DIAGNOSIS — E1165 Type 2 diabetes mellitus with hyperglycemia: Secondary | ICD-10-CM | POA: Diagnosis not present

## 2019-08-16 DIAGNOSIS — E782 Mixed hyperlipidemia: Secondary | ICD-10-CM | POA: Diagnosis not present

## 2019-08-16 DIAGNOSIS — E559 Vitamin D deficiency, unspecified: Secondary | ICD-10-CM | POA: Diagnosis not present

## 2019-08-16 DIAGNOSIS — D649 Anemia, unspecified: Secondary | ICD-10-CM | POA: Diagnosis not present

## 2019-08-16 DIAGNOSIS — C61 Malignant neoplasm of prostate: Secondary | ICD-10-CM | POA: Diagnosis not present

## 2019-08-16 DIAGNOSIS — E1122 Type 2 diabetes mellitus with diabetic chronic kidney disease: Secondary | ICD-10-CM | POA: Diagnosis not present

## 2019-08-16 DIAGNOSIS — E538 Deficiency of other specified B group vitamins: Secondary | ICD-10-CM | POA: Diagnosis not present

## 2019-08-16 DIAGNOSIS — I2510 Atherosclerotic heart disease of native coronary artery without angina pectoris: Secondary | ICD-10-CM | POA: Diagnosis not present

## 2019-08-16 DIAGNOSIS — N1832 Chronic kidney disease, stage 3b: Secondary | ICD-10-CM | POA: Diagnosis not present

## 2019-08-17 DIAGNOSIS — E119 Type 2 diabetes mellitus without complications: Secondary | ICD-10-CM | POA: Diagnosis not present

## 2019-08-17 DIAGNOSIS — I10 Essential (primary) hypertension: Secondary | ICD-10-CM | POA: Diagnosis not present

## 2019-08-17 DIAGNOSIS — R269 Unspecified abnormalities of gait and mobility: Secondary | ICD-10-CM | POA: Diagnosis not present

## 2019-08-17 DIAGNOSIS — Z950 Presence of cardiac pacemaker: Secondary | ICD-10-CM | POA: Diagnosis not present

## 2019-08-17 DIAGNOSIS — I509 Heart failure, unspecified: Secondary | ICD-10-CM | POA: Diagnosis not present

## 2019-08-17 DIAGNOSIS — E1140 Type 2 diabetes mellitus with diabetic neuropathy, unspecified: Secondary | ICD-10-CM | POA: Diagnosis not present

## 2019-08-17 DIAGNOSIS — M6281 Muscle weakness (generalized): Secondary | ICD-10-CM | POA: Diagnosis not present

## 2019-08-17 DIAGNOSIS — G20 Parkinson's disease: Secondary | ICD-10-CM | POA: Diagnosis not present

## 2019-08-17 DIAGNOSIS — I2510 Atherosclerotic heart disease of native coronary artery without angina pectoris: Secondary | ICD-10-CM | POA: Diagnosis not present

## 2019-08-21 DIAGNOSIS — Z950 Presence of cardiac pacemaker: Secondary | ICD-10-CM | POA: Diagnosis not present

## 2019-08-21 DIAGNOSIS — E119 Type 2 diabetes mellitus without complications: Secondary | ICD-10-CM | POA: Diagnosis not present

## 2019-08-21 DIAGNOSIS — R269 Unspecified abnormalities of gait and mobility: Secondary | ICD-10-CM | POA: Diagnosis not present

## 2019-08-21 DIAGNOSIS — G20 Parkinson's disease: Secondary | ICD-10-CM | POA: Diagnosis not present

## 2019-08-21 DIAGNOSIS — I2510 Atherosclerotic heart disease of native coronary artery without angina pectoris: Secondary | ICD-10-CM | POA: Diagnosis not present

## 2019-08-21 DIAGNOSIS — M6281 Muscle weakness (generalized): Secondary | ICD-10-CM | POA: Diagnosis not present

## 2019-08-21 DIAGNOSIS — I10 Essential (primary) hypertension: Secondary | ICD-10-CM | POA: Diagnosis not present

## 2019-08-21 DIAGNOSIS — I509 Heart failure, unspecified: Secondary | ICD-10-CM | POA: Diagnosis not present

## 2019-08-21 DIAGNOSIS — E1140 Type 2 diabetes mellitus with diabetic neuropathy, unspecified: Secondary | ICD-10-CM | POA: Diagnosis not present

## 2019-08-24 DIAGNOSIS — R269 Unspecified abnormalities of gait and mobility: Secondary | ICD-10-CM | POA: Diagnosis not present

## 2019-08-24 DIAGNOSIS — E119 Type 2 diabetes mellitus without complications: Secondary | ICD-10-CM | POA: Diagnosis not present

## 2019-08-24 DIAGNOSIS — I10 Essential (primary) hypertension: Secondary | ICD-10-CM | POA: Diagnosis not present

## 2019-08-24 DIAGNOSIS — I509 Heart failure, unspecified: Secondary | ICD-10-CM | POA: Diagnosis not present

## 2019-08-24 DIAGNOSIS — E1140 Type 2 diabetes mellitus with diabetic neuropathy, unspecified: Secondary | ICD-10-CM | POA: Diagnosis not present

## 2019-08-24 DIAGNOSIS — G20 Parkinson's disease: Secondary | ICD-10-CM | POA: Diagnosis not present

## 2019-08-24 DIAGNOSIS — I2510 Atherosclerotic heart disease of native coronary artery without angina pectoris: Secondary | ICD-10-CM | POA: Diagnosis not present

## 2019-08-24 DIAGNOSIS — M6281 Muscle weakness (generalized): Secondary | ICD-10-CM | POA: Diagnosis not present

## 2019-08-24 DIAGNOSIS — Z950 Presence of cardiac pacemaker: Secondary | ICD-10-CM | POA: Diagnosis not present

## 2019-08-28 DIAGNOSIS — I509 Heart failure, unspecified: Secondary | ICD-10-CM | POA: Diagnosis not present

## 2019-08-28 DIAGNOSIS — R269 Unspecified abnormalities of gait and mobility: Secondary | ICD-10-CM | POA: Diagnosis not present

## 2019-08-28 DIAGNOSIS — I10 Essential (primary) hypertension: Secondary | ICD-10-CM | POA: Diagnosis not present

## 2019-08-28 DIAGNOSIS — I2510 Atherosclerotic heart disease of native coronary artery without angina pectoris: Secondary | ICD-10-CM | POA: Diagnosis not present

## 2019-08-28 DIAGNOSIS — G20 Parkinson's disease: Secondary | ICD-10-CM | POA: Diagnosis not present

## 2019-08-28 DIAGNOSIS — E1140 Type 2 diabetes mellitus with diabetic neuropathy, unspecified: Secondary | ICD-10-CM | POA: Diagnosis not present

## 2019-08-28 DIAGNOSIS — Z950 Presence of cardiac pacemaker: Secondary | ICD-10-CM | POA: Diagnosis not present

## 2019-08-28 DIAGNOSIS — M6281 Muscle weakness (generalized): Secondary | ICD-10-CM | POA: Diagnosis not present

## 2019-08-28 DIAGNOSIS — E119 Type 2 diabetes mellitus without complications: Secondary | ICD-10-CM | POA: Diagnosis not present

## 2019-08-31 DIAGNOSIS — E119 Type 2 diabetes mellitus without complications: Secondary | ICD-10-CM | POA: Diagnosis not present

## 2019-08-31 DIAGNOSIS — Z950 Presence of cardiac pacemaker: Secondary | ICD-10-CM | POA: Diagnosis not present

## 2019-08-31 DIAGNOSIS — E1140 Type 2 diabetes mellitus with diabetic neuropathy, unspecified: Secondary | ICD-10-CM | POA: Diagnosis not present

## 2019-08-31 DIAGNOSIS — I2510 Atherosclerotic heart disease of native coronary artery without angina pectoris: Secondary | ICD-10-CM | POA: Diagnosis not present

## 2019-08-31 DIAGNOSIS — G20 Parkinson's disease: Secondary | ICD-10-CM | POA: Diagnosis not present

## 2019-08-31 DIAGNOSIS — M6281 Muscle weakness (generalized): Secondary | ICD-10-CM | POA: Diagnosis not present

## 2019-08-31 DIAGNOSIS — R269 Unspecified abnormalities of gait and mobility: Secondary | ICD-10-CM | POA: Diagnosis not present

## 2019-08-31 DIAGNOSIS — I10 Essential (primary) hypertension: Secondary | ICD-10-CM | POA: Diagnosis not present

## 2019-08-31 DIAGNOSIS — I509 Heart failure, unspecified: Secondary | ICD-10-CM | POA: Diagnosis not present

## 2019-09-01 ENCOUNTER — Emergency Department (HOSPITAL_COMMUNITY)
Admission: EM | Admit: 2019-09-01 | Discharge: 2019-09-02 | Disposition: A | Discharge status: Skilled Nursing Facility | Payer: Medicare HMO | Attending: Emergency Medicine | Admitting: Emergency Medicine

## 2019-09-01 ENCOUNTER — Other Ambulatory Visit: Payer: Self-pay

## 2019-09-01 ENCOUNTER — Encounter (HOSPITAL_COMMUNITY): Payer: Self-pay | Admitting: Emergency Medicine

## 2019-09-01 DIAGNOSIS — Z79899 Other long term (current) drug therapy: Secondary | ICD-10-CM | POA: Insufficient documentation

## 2019-09-01 DIAGNOSIS — R112 Nausea with vomiting, unspecified: Secondary | ICD-10-CM | POA: Diagnosis not present

## 2019-09-01 DIAGNOSIS — R1013 Epigastric pain: Secondary | ICD-10-CM

## 2019-09-01 DIAGNOSIS — E1122 Type 2 diabetes mellitus with diabetic chronic kidney disease: Secondary | ICD-10-CM | POA: Diagnosis not present

## 2019-09-01 DIAGNOSIS — N1832 Chronic kidney disease, stage 3b: Secondary | ICD-10-CM | POA: Insufficient documentation

## 2019-09-01 DIAGNOSIS — Z7984 Long term (current) use of oral hypoglycemic drugs: Secondary | ICD-10-CM | POA: Insufficient documentation

## 2019-09-01 DIAGNOSIS — D539 Nutritional anemia, unspecified: Secondary | ICD-10-CM

## 2019-09-01 DIAGNOSIS — R0902 Hypoxemia: Secondary | ICD-10-CM | POA: Diagnosis not present

## 2019-09-01 DIAGNOSIS — R52 Pain, unspecified: Secondary | ICD-10-CM | POA: Diagnosis not present

## 2019-09-01 DIAGNOSIS — K8020 Calculus of gallbladder without cholecystitis without obstruction: Secondary | ICD-10-CM | POA: Diagnosis not present

## 2019-09-01 DIAGNOSIS — G20 Parkinson's disease: Secondary | ICD-10-CM | POA: Insufficient documentation

## 2019-09-01 DIAGNOSIS — Z87891 Personal history of nicotine dependence: Secondary | ICD-10-CM | POA: Insufficient documentation

## 2019-09-01 DIAGNOSIS — D649 Anemia, unspecified: Secondary | ICD-10-CM | POA: Diagnosis not present

## 2019-09-01 DIAGNOSIS — E1165 Type 2 diabetes mellitus with hyperglycemia: Secondary | ICD-10-CM | POA: Diagnosis not present

## 2019-09-01 DIAGNOSIS — I129 Hypertensive chronic kidney disease with stage 1 through stage 4 chronic kidney disease, or unspecified chronic kidney disease: Secondary | ICD-10-CM | POA: Diagnosis not present

## 2019-09-01 DIAGNOSIS — R1110 Vomiting, unspecified: Secondary | ICD-10-CM | POA: Diagnosis present

## 2019-09-01 DIAGNOSIS — R1084 Generalized abdominal pain: Secondary | ICD-10-CM | POA: Diagnosis not present

## 2019-09-01 DIAGNOSIS — D631 Anemia in chronic kidney disease: Secondary | ICD-10-CM | POA: Diagnosis not present

## 2019-09-01 HISTORY — DX: Parkinson's disease: G20

## 2019-09-01 HISTORY — DX: Parkinson's disease without dyskinesia, without mention of fluctuations: G20.A1

## 2019-09-01 NOTE — ED Provider Notes (Signed)
[facility] DEPT Provider Note: Georgena Spurling, MD, FACEP  CSN: [id number] MRN: [id number] ARRIVAL: 09/01/19 at [place]: WA14/WA14   CHIEF COMPLAINT  Vomiting  Level 5 caveat: Dementia HISTORY OF PRESENT ILLNESS  09/01/19 11:39 PM [name] Powell is a 86 y.o. male sent from his assisted living facility after 1 episode of vomiting just prior to arrival.  He had not been eating well earlier today.  The patient denies to me any pain in his abdomen.   Past Medical History:  Diagnosis Date  . Anemia    chronic  . Atrial fibrillation ([place])   . CHB (complete heart block) ([place])   . Coronary artery disease   . Diabetes mellitus   . Dyslipidemia   . Hypertension   . New onset atrial flutter, persistent 07/04/2014  . Pacemaker 12/12/2006   Medtronic adapta  . Parkinson's disease ([place])   . Pleural effusion, left   . Pneumonia 09/2015  . Prostate cancer ([place])   . 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 Ventricular tachycardia (paroxysmal) ([place]) 03/28/2014    Past Surgical History:  Procedure Laterality Date  . CORONARY ARTERY BYPASS GRAFT  09/04/2001   LIMA to LAD,SVG to diagonal,SVG to ramus intermedius,SVG to PDA  . NM MYOVIEW LTD  01/09/2010   no ischemia  . PACEMAKER INSERTION  12/12/2006   Medtronic adapta  . PPM GENERATOR CHANGEOUT N/A 02/02/2017   Procedure: PPM GENERATOR CHANGEOUT - DUAL CHAMBER;  Surgeon: [name], MD;  Location: [facility];  Service: Cardiovascular;  Laterality: N/A;  . PROSTATE SURGERY  2001   cancer  . TONSILLECTOMY      Family History  Problem Relation Age of Onset  . Heart disease Mother   . Heart attack Mother   . Other Father        sepsis  . Other Sister        brain tumor    Social History   Tobacco Use  . Smoking status: Former Smoker    Types: Cigarettes, Cigars    Quit date: 05/14/1962    Years since quitting: 57.3  . Smokeless tobacco: Never Used  Substance  Use Topics  . Alcohol use: Yes    Comment: seldom  . Drug use: No    Prior to Admission medications   Medication Sig Start Date End Date Taking? Authorizing Provider  acetaminophen (TYLENOL) 650 MG CR tablet Take 650 mg by mouth every 8 (eight) hours as needed for pain.   Yes [provider]  atorvastatin (LIPITOR) 20 MG tablet Take 20 mg by mouth daily.   Yes [provider]  carbidopa-levodopa (SINEMET IR) 25-100 MG tablet Take 1 tablet by mouth 3 (three) times daily. 01/03/19  Yes Penumalli, Earlean Polka, MD  Cholecalciferol (VITAMIN D PO) Take 10,000 Units by mouth every Friday. On Friday    Yes [provider]  furosemide (LASIX) 40 MG tablet Take 40 mg by mouth daily.   Yes [provider]  JANUVIA 50 MG tablet Take 50 mg by mouth every evening.  05/27/15  Yes [provider]  metoprolol tartrate (LOPRESSOR) 50 MG tablet TAKE 2 TABLETS IN THE MORNING  AND TAKE 1 TABLET IN THE EVENING Patient taking differently: Take 50-100 mg by mouth See admin instructions. 100mg  in am & 50mg  in evening. 03/09/19  Yes Croitoru, Mihai, MD  Multiple Vitamins-Minerals (PRESERVISION AREDS) CAPS Take 1 capsule by mouth  2 (two) times daily.   Yes [provider]  polyethylene glycol (MIRALAX / GLYCOLAX) packet Take 17 g by mouth daily. To prevent constipation    Yes [provider]  QUEtiapine (SEROQUEL) 25 MG tablet Take 12.5 mg by mouth at bedtime.   Yes [provider]  vitamin B-12 (CYANOCOBALAMIN) 1000 MCG tablet Take 1,000 mcg by mouth daily.   Yes [provider]    Allergies Milk-related compounds, Lanoxin [digoxin], and Mexitil [mexiletine]   REVIEW OF SYSTEMS     PHYSICAL EXAMINATION  Initial Vital Signs Blood pressure (!) 148/63, pulse 62, temperature 98.9 F (37.2 C), temperature source Axillary, resp. rate 20, height 6' (1.829 m), weight 74 kg, SpO2 91 %.  Examination General: Well-developed, well-nourished  male in no acute distress; appearance consistent with age of record HENT: normocephalic; atraumatic Eyes: pupils equal, round and reactive to light Neck: supple Heart: regular rate and rhythm Lungs: clear to auscultation bilaterally Abdomen: soft; nondistended; nontender; no masses or hepatosplenomegaly; bowel sounds present Extremities: No deformity; DP pulses +1 Neurologic: Awake, alert; minimally verbal; noted to move all extremities but with cogwheel rigidity and generalized weakness Skin: Warm and dry Psychiatric: Flat affect   RESULTS  Summary of this visit's results, reviewed and interpreted by myself:   EKG Interpretation  Date/Time:    Ventricular Rate:    PR Interval:    QRS Duration:   QT Interval:    QTC Calculation:   R Axis:     Text Interpretation:        Laboratory Studies: Results for orders placed or performed during the hospital encounter of 09/01/19 (from the past 24 hour(s))  CBC with Differential/Platelet     Status: Abnormal   Collection Time: 09/02/19 12:17 AM  Result Value Ref Range   WBC 11.0 (H) 4.0 - 10.5 K/uL   RBC 2.56 (L) 4.22 - 5.81 MIL/uL   Hemoglobin 8.5 (L) 13.0 - 17.0 g/dL   HCT 27.7 (L) 39.0 - 52.0 %   MCV 108.2 (H) 80.0 - 100.0 fL   MCH 33.2 26.0 - 34.0 pg   MCHC 30.7 30.0 - 36.0 g/dL   RDW 18.5 (H) 11.5 - 15.5 %   Platelets 190 150 - 400 K/uL   nRBC 0.0 0.0 - 0.2 %   Neutrophils Relative % 63 %   Neutro Abs 7.6 1.7 - 7.7 K/uL   Band Neutrophils 6 %   Lymphocytes Relative 10 %   Lymphs Abs 1.1 0.7 - 4.0 K/uL   Monocytes Relative 18 %   Monocytes Absolute 2.0 (H) 0.1 - 1.0 K/uL   Eosinophils Relative 0 %   Eosinophils Absolute 0.0 0.0 - 0.5 K/uL   Basophils Relative 0 %   Basophils Absolute 0.0 0.0 - 0.1 K/uL   WBC Morphology MILD LEFT SHIFT (1-5% METAS, OCC MYELO, OCC BANDS)    Metamyelocytes Relative 2 %   Myelocytes 1 %   Abs Immature Granulocytes 0.30 (H) 0.00 - 0.07 K/uL  Comprehensive metabolic panel     Status:  Abnormal   Collection Time: 09/02/19 12:17 AM  Result Value Ref Range   Sodium 139 135 - 145 mmol/L   Potassium 4.6 3.5 - 5.1 mmol/L   Chloride 99 98 - 111 mmol/L   CO2 28 22 - 32 mmol/L   Glucose, Bld 251 (H) 70 - 99 mg/dL   BUN 43 (H) 8 - 23 mg/dL   Creatinine, Ser 1.68 (H) 0.61 - 1.24 mg/dL   Calcium 8.7 (  L) 8.9 - 10.3 mg/dL   Total Protein 7.8 6.5 - 8.1 g/dL   Albumin 3.2 (L) 3.5 - 5.0 g/dL   AST 18 15 - 41 U/L   ALT 6 0 - 44 U/L   Alkaline Phosphatase 131 (H) 38 - 126 U/L   Total Bilirubin 0.9 0.3 - 1.2 mg/dL   GFR calc non Af Amer 34 (L) >60 mL/min   GFR calc Af Amer 39 (L) >60 mL/min   Anion gap 12 5 - 15  Lipase, blood     Status: None   Collection Time: 09/02/19 12:17 AM  Result Value Ref Range   Lipase 33 11 - 51 U/L  Urinalysis, Routine w reflex microscopic     Status: Abnormal   Collection Time: 09/02/19  1:30 AM  Result Value Ref Range   Color, Urine YELLOW YELLOW   APPearance CLEAR CLEAR   Specific Gravity, Urine 1.013 1.005 - 1.030   pH 5.0 5.0 - 8.0   Glucose, UA 50 (A) NEGATIVE mg/dL   Hgb urine dipstick NEGATIVE NEGATIVE   Bilirubin Urine NEGATIVE NEGATIVE   Ketones, ur NEGATIVE NEGATIVE mg/dL   Protein, ur 100 (A) NEGATIVE mg/dL   Nitrite NEGATIVE NEGATIVE   Leukocytes,Ua NEGATIVE NEGATIVE   RBC / HPF 0-5 0 - 5 RBC/hpf   WBC, UA 0-5 0 - 5 WBC/hpf   Bacteria, UA NONE SEEN NONE SEEN   Squamous Epithelial / LPF 0-5 0 - 5   Imaging Studies: CT ABDOMEN PELVIS WO CONTRAST  Result Date: 09/02/2019 CLINICAL DATA:  Abdominal pain EXAM: CT ABDOMEN AND PELVIS WITHOUT CONTRAST TECHNIQUE: Multidetector CT imaging of the abdomen and pelvis was performed following the standard protocol without IV contrast. COMPARISON:  None. FINDINGS: Lower chest: Again noted is moderate cardiomegaly with partially visualized pacemaker leads. No hiatal hernia. Rounded patchy airspace opacity at the posterior left lung base. There is bilateral thickening. Hepatobiliary: Although  limited due to the lack of intravenous contrast, normal in appearance without gross focal abnormality. Calcified gallstones are present. Pancreas:  Unremarkable.  No surrounding inflammatory changes. Spleen: Normal in size. Although limited due to the lack of intravenous contrast, normal in appearance. Adrenals/Urinary Tract: Both adrenal glands appear normal. There is a slightly hyperdense lesion seen partially exophytic off the upper pole of the right kidney measuring 1.3 cm. This was not seen on the prior exam dating back to 2014. Again noted is a low-density 2 cm lesion seen in the upper pole the left kidney. No renal or collecting system calculi are seen. No hydronephrosis. There is mild diffuse bladder wall thickening again noted. Stomach/Bowel: The stomach, small bowel, and colon are normal in appearance. No inflammatory changes or obstructive findings. Scattered colonic diverticula are seen. Vascular/Lymphatic: There are no enlarged abdominal or pelvic lymph nodes. Scattered aortic atherosclerotic calcifications are seen without aneurysmal dilatation. Reproductive: Radiation prostate seeds are seen. Other: No evidence of abdominal wall mass or hernia. Musculoskeletal: No acute or significant osseous findings. Again noted are advanced degenerative changes of the lower lumbar spine. IMPRESSION: Patchy rounded airspace opacity at the left lung base which may be due to rounded atelectasis and/or infectious etiology. Moderate cardiomegaly 1.3 cm right renal hyperdense lesion which may be due to proteinaceous/hemorrhagic cyst. Further evaluation with pre and post contrast MRI should be considered. Moderate amount of right colonic stool without evidence of obstruction. Cholelithiasis Findings suggestive of chronic cystitis Aortic Atherosclerosis (ICD10-I70.0). Electronically Signed   By: Prudencio Pair M.D.   On: 09/02/2019 03:33  DG Chest Port 1 View  Result Date: 09/02/2019 CLINICAL DATA:  Hypoxia. EXAM:  PORTABLE CHEST 1 VIEW COMPARISON:  July 07, 2019 FINDINGS: Multiple radiopaque cardiac lead wires are seen overlying the left lung. Multiple sternal wires are present. Very mild atelectasis and/or infiltrate is suspected within the left lung base. There is no evidence of a pleural effusion or pneumothorax. The cardiac silhouette is moderately enlarged and unchanged in size. There is marked severity calcification of the aortic arch. Multilevel degenerative changes seen throughout the thoracic spine. IMPRESSION: 1. Very mild left basilar atelectasis and/or infiltrate. 2. Evidence of prior median sternotomy. Electronically Signed   By: Virgina Norfolk M.D.   On: 09/02/2019 00:18    ED COURSE and MDM  Nursing notes, initial and subsequent vitals signs, including pulse oximetry, reviewed and interpreted by myself.  Vitals:   09/02/19 0000 09/02/19 0100 09/02/19 0200 09/02/19 0304  BP: (!) 160/62 (!) 144/53 (!) 154/66 (!) 155/61  Pulse: (!) 58 (!) 59 60 64  Resp: (!) 28 (!) 26 (!) 26 (!) 30  Temp:      TempSrc:      SpO2: 91% 95% 100% 97%  Weight:      Height:       Medications - No data to display  11:54 PM The patient's caretaker has arrived.  She states the patient was complaining of upper abdominal pain about 4 PM and had a total of 2 episodes of vomiting.  His pain was worse lying supine and improved sitting upright.  He was given an enema but it was unclear if this caused any improvement.  The caretaker states she is normally on oxygen and is currently satting 100% on 2 L.  3:46 AM No significant acute pathology seen on patient CT scan.  His lab work shows anemia but he has chronic anemia.  His lab work shows renal insufficiency but he has chronic renal insufficiency, usually worse than it is this morning.  He is denying pain in his abdomen is soft and nontender.  He has not vomited in the ED.  His family is comfortable taking him back to his living facility.   PROCEDURES   Procedures   ED DIAGNOSES     ICD-10-CM   1. Nausea and vomiting in adult  R11.2   2. Epigastric pain  R10.13   3. Macrocytic anemia  D53.9   4. Chronic renal impairment, stage 3b  N18.32        Sanora Cunanan, Jenny Reichmann, MD 09/02/19 [phone]

## 2019-09-01 NOTE — ED Triage Notes (Signed)
PT presents via [facility] for evaluation of vomiting with abdominal pain. Pt presents from [facility] assisted living. EMS reports that pt had one episode of vomiting prior to their arrival and that SNF advised that pt had not been able to eat today.

## 2019-09-02 ENCOUNTER — Emergency Department (HOSPITAL_COMMUNITY): Payer: Medicare HMO

## 2019-09-02 DIAGNOSIS — R279 Unspecified lack of coordination: Secondary | ICD-10-CM | POA: Diagnosis not present

## 2019-09-02 DIAGNOSIS — R110 Nausea: Secondary | ICD-10-CM | POA: Diagnosis not present

## 2019-09-02 DIAGNOSIS — R0902 Hypoxemia: Secondary | ICD-10-CM | POA: Diagnosis not present

## 2019-09-02 DIAGNOSIS — K8020 Calculus of gallbladder without cholecystitis without obstruction: Secondary | ICD-10-CM | POA: Diagnosis not present

## 2019-09-02 DIAGNOSIS — R112 Nausea with vomiting, unspecified: Secondary | ICD-10-CM | POA: Diagnosis not present

## 2019-09-02 DIAGNOSIS — I959 Hypotension, unspecified: Secondary | ICD-10-CM | POA: Diagnosis not present

## 2019-09-02 DIAGNOSIS — Z743 Need for continuous supervision: Secondary | ICD-10-CM | POA: Diagnosis not present

## 2019-09-02 LAB — CBC WITH DIFFERENTIAL/PLATELET
Abs Immature Granulocytes: 0.3 10*3/uL — ABNORMAL HIGH (ref 0.00–0.07)
Band Neutrophils: 6 %
Basophils Absolute: 0 10*3/uL (ref 0.0–0.1)
Basophils Relative: 0 %
Eosinophils Absolute: 0 10*3/uL (ref 0.0–0.5)
Eosinophils Relative: 0 %
HCT: 27.7 % — ABNORMAL LOW (ref 39.0–52.0)
Hemoglobin: 8.5 g/dL — ABNORMAL LOW (ref 13.0–17.0)
Lymphocytes Relative: 10 %
Lymphs Abs: 1.1 10*3/uL (ref 0.7–4.0)
MCH: 33.2 pg (ref 26.0–34.0)
MCHC: 30.7 g/dL (ref 30.0–36.0)
MCV: 108.2 fL — ABNORMAL HIGH (ref 80.0–100.0)
Metamyelocytes Relative: 2 %
Monocytes Absolute: 2 10*3/uL — ABNORMAL HIGH (ref 0.1–1.0)
Monocytes Relative: 18 %
Myelocytes: 1 %
Neutro Abs: 7.6 10*3/uL (ref 1.7–7.7)
Neutrophils Relative %: 63 %
Platelets: 190 10*3/uL (ref 150–400)
RBC: 2.56 MIL/uL — ABNORMAL LOW (ref 4.22–5.81)
RDW: 18.5 % — ABNORMAL HIGH (ref 11.5–15.5)
WBC: 11 10*3/uL — ABNORMAL HIGH (ref 4.0–10.5)
nRBC: 0 % (ref 0.0–0.2)

## 2019-09-02 LAB — COMPREHENSIVE METABOLIC PANEL
ALT: 6 U/L (ref 0–44)
AST: 18 U/L (ref 15–41)
Albumin: 3.2 g/dL — ABNORMAL LOW (ref 3.5–5.0)
Alkaline Phosphatase: 131 U/L — ABNORMAL HIGH (ref 38–126)
Anion gap: 12 (ref 5–15)
BUN: 43 mg/dL — ABNORMAL HIGH (ref 8–23)
CO2: 28 mmol/L (ref 22–32)
Calcium: 8.7 mg/dL — ABNORMAL LOW (ref 8.9–10.3)
Chloride: 99 mmol/L (ref 98–111)
Creatinine, Ser: 1.68 mg/dL — ABNORMAL HIGH (ref 0.61–1.24)
GFR calc Af Amer: 39 mL/min — ABNORMAL LOW (ref >60)
GFR calc non Af Amer: 34 mL/min — ABNORMAL LOW (ref >60)
Glucose, Bld: 251 mg/dL — ABNORMAL HIGH (ref 70–99)
Potassium: 4.6 mmol/L (ref 3.5–5.1)
Sodium: 139 mmol/L (ref 135–145)
Total Bilirubin: 0.9 mg/dL (ref 0.3–1.2)
Total Protein: 7.8 g/dL (ref 6.5–8.1)

## 2019-09-02 LAB — URINALYSIS, ROUTINE W REFLEX MICROSCOPIC
Bacteria, UA: NONE SEEN
Bilirubin Urine: NEGATIVE
Glucose, UA: 50 mg/dL — AB
Hgb urine dipstick: NEGATIVE
Ketones, ur: NEGATIVE mg/dL
Leukocytes,Ua: NEGATIVE
Nitrite: NEGATIVE
Protein, ur: 100 mg/dL — AB
Specific Gravity, Urine: 1.013 (ref 1.005–1.030)
pH: 5 (ref 5.0–8.0)

## 2019-09-02 LAB — LIPASE, BLOOD: Lipase: 33 U/L (ref 11–51)

## 2019-09-02 MED ORDER — IOHEXOL 9 MG/ML PO SOLN
ORAL | Status: AC
  Filled 2019-09-02: qty 500

## 2019-09-02 NOTE — ED Notes (Signed)
[facility] notified of need for transport of pt back to residence.  

## 2019-09-03 DIAGNOSIS — I499 Cardiac arrhythmia, unspecified: Secondary | ICD-10-CM | POA: Diagnosis not present

## 2019-09-03 DIAGNOSIS — R404 Transient alteration of awareness: Secondary | ICD-10-CM | POA: Diagnosis not present

## 2019-09-03 DIAGNOSIS — R1084 Generalized abdominal pain: Secondary | ICD-10-CM | POA: Diagnosis not present

## 2019-09-03 DIAGNOSIS — R52 Pain, unspecified: Secondary | ICD-10-CM | POA: Diagnosis not present

## 2019-09-08 DIAGNOSIS — 419620001 Death: Secondary | SNOMED CT | POA: Diagnosis not present

## 2019-09-08 DEATH — deceased

## 2019-09-10 ENCOUNTER — Telehealth: Payer: Self-pay | Admitting: Diagnostic Neuroimaging

## 2019-09-10 ENCOUNTER — Ambulatory Visit: Payer: Medicare HMO | Admitting: Diagnostic Neuroimaging

## 2019-09-10 NOTE — Telephone Encounter (Signed)
Called wife and offered sympathies to her. I advised she contact pharmacy to ask if they take medications back or know of somewhere she can donate them.  Patient verbalized understanding, appreciation.

## 2019-09-10 NOTE — Telephone Encounter (Signed)
Pt wife called to report pt passed away last monday and they have two brand new bottles of the pts carvedilopa and would like to know if the pharmacy would like to take them back to provide another pt that may not be able to afford it or what she should do

## 2020-01-07 ENCOUNTER — Ambulatory Visit: Payer: Medicare HMO | Admitting: Diagnostic Neuroimaging

## 2020-10-12 IMAGING — CT CT HEAD W/O CM
4 of 7 series · 15 of 47 positions shown, 16 images · non-contrast
Comparison: 05/28/2019

CLINICAL DATA: Recent fall with headaches and neck pain, initial
encounter

EXAM:
CT HEAD WITHOUT CONTRAST
CT CERVICAL SPINE WITHOUT CONTRAST
TECHNIQUE: Multidetector CT imaging of the head and cervical spine was
performed following the standard protocol without intravenous
contrast. Multiplanar CT image reconstructions of the cervical spine
were also generated.

[Series 3: head without · axial · non-contrast · 0.46mm/px · z∈[-88,-3]mm · 3 of 35 slices shown, 4 images]
[im 9/35  brain]
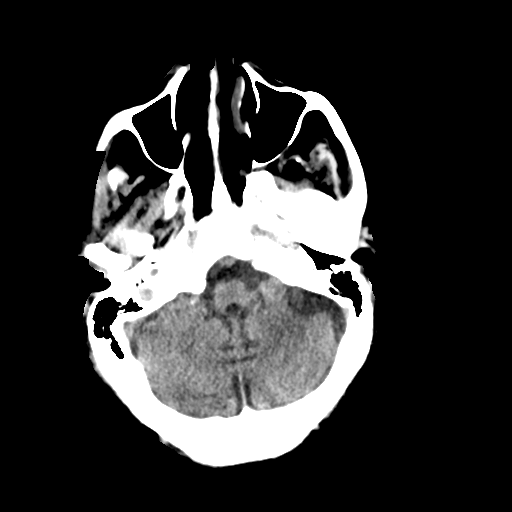
[im 9/35  bone]
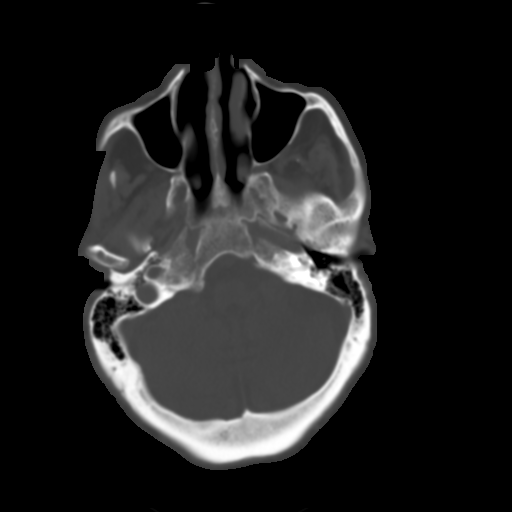
[im 18/35  brain]
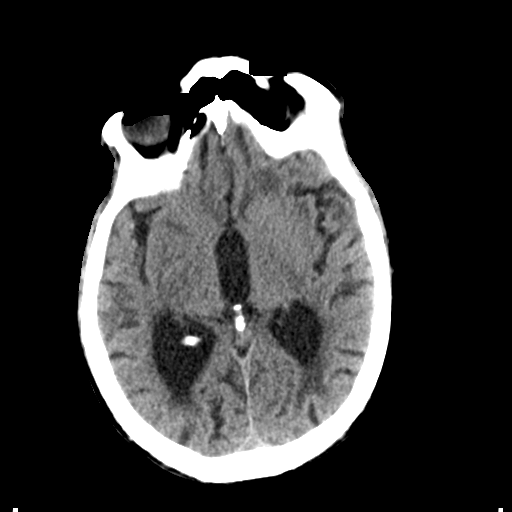
[im 26/35  brain]
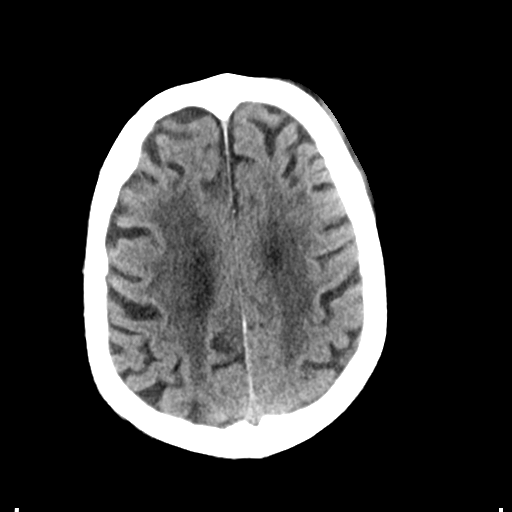

[Series 4: head bone · axial · 0.46mm/px · z∈[-114,+0]mm · 7 of 87 slices shown]
[im 8/87  bone]
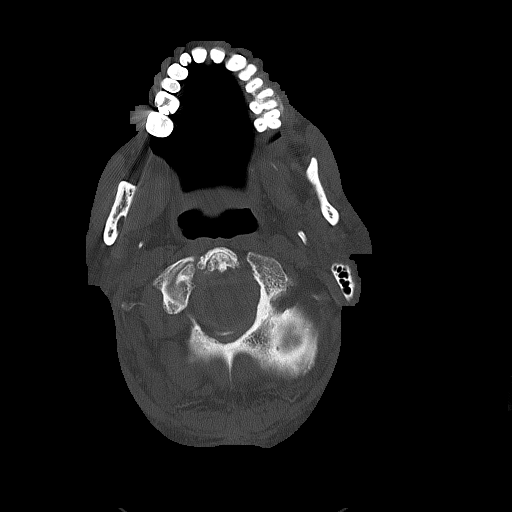
[im 22/87  bone]
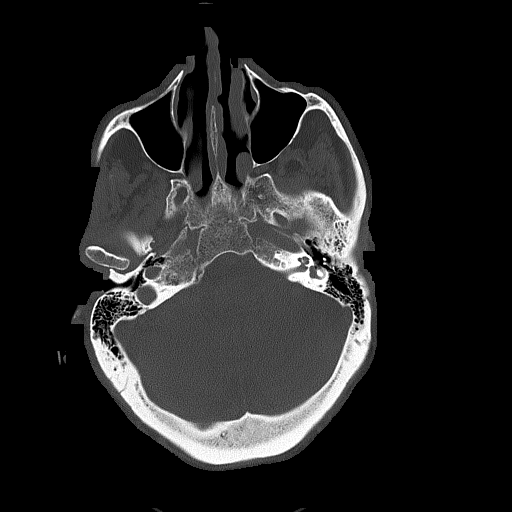
[im 29/87  bone]
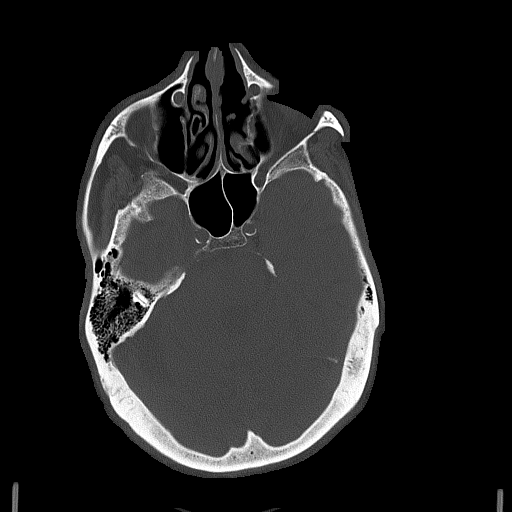
[im 36/87  bone]
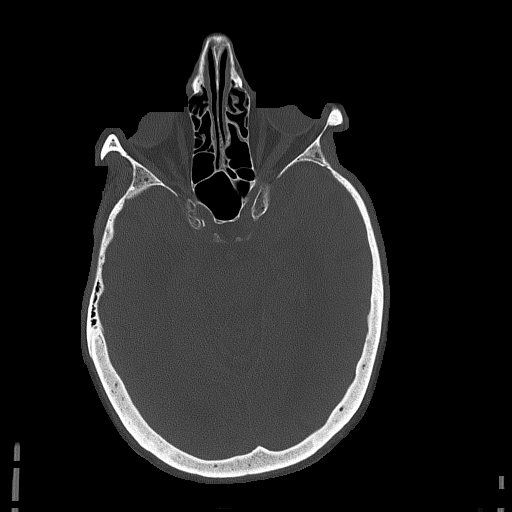
[im 51/87  bone]
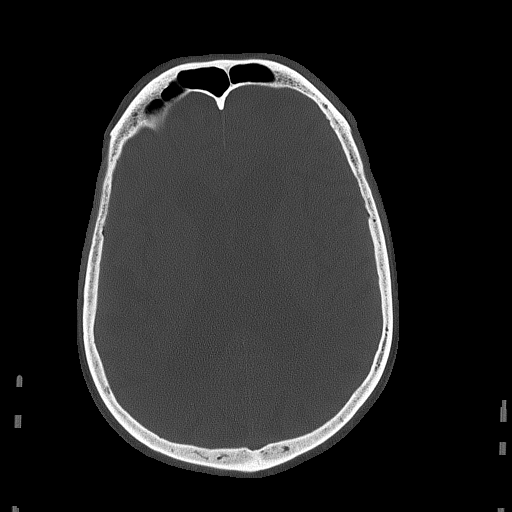
[im 58/87  bone]
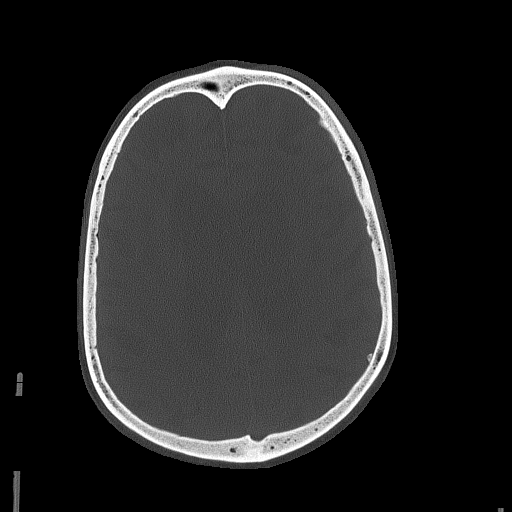
[im 65/87  bone]
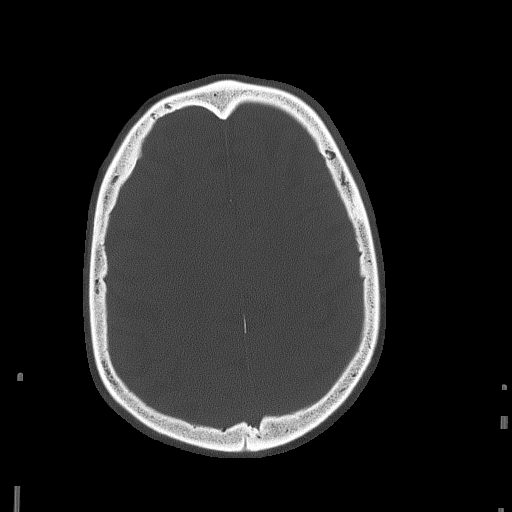

[Series 5: head without cor · coronal · non-contrast · 0.34mm/px · 3 of 74 slices shown]
[im 25/74  brain]
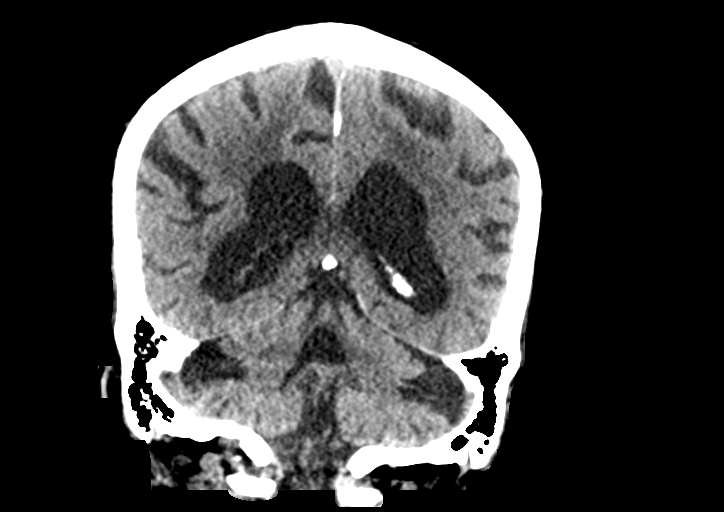
[im 37/74  brain]
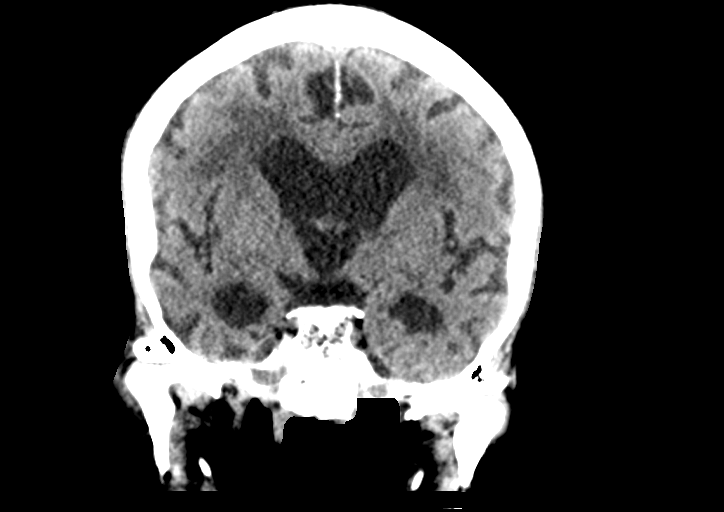
[im 49/74  brain]
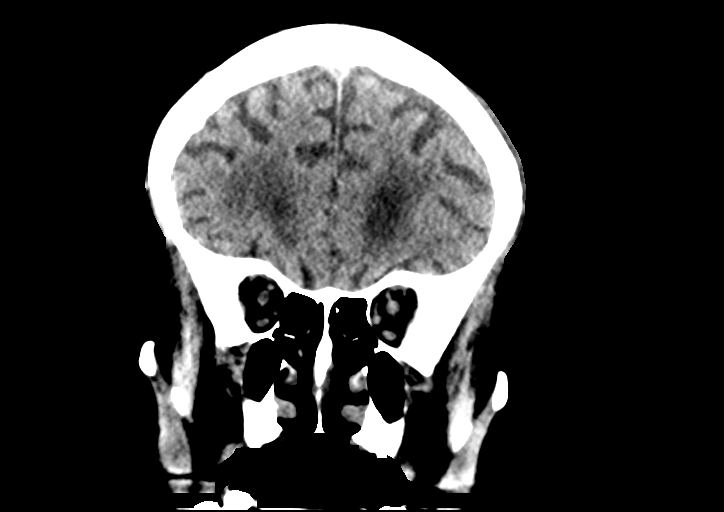

[Series 6: head without sag · sagittal · non-contrast · 0.34mm/px · 2 of 62 slices shown]
[im 21/62  brain]
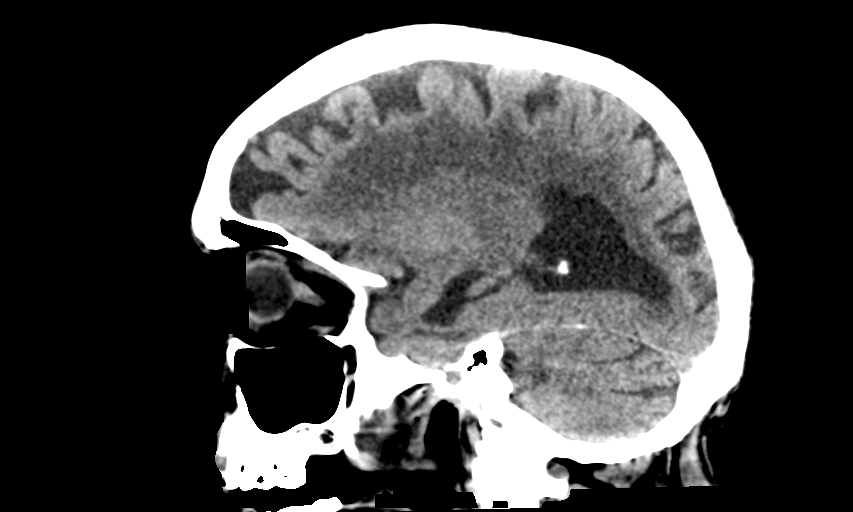
[im 41/62  brain]
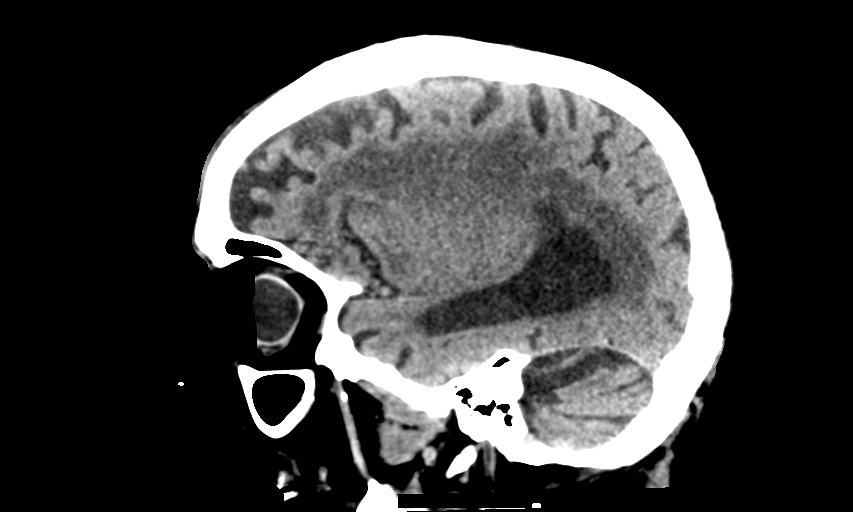

[15 of 47 positions shown; findings below may reference images not displayed]

FINDINGS: CT HEAD FINDINGS

Brain: Chronic atrophic changes and ischemic changes are seen. No
findings to suggest acute hemorrhage, acute infarction or
space-occupying mass lesion are noted.

Vascular: No hyperdense vessel or unexpected calcification.

Skull: Normal. Negative for fracture or focal lesion.

Sinuses/Orbits: No acute finding.

Other: Left supraorbital soft tissue swelling is noted consistent
with the recent injury.

CT CERVICAL SPINE FINDINGS

Alignment: Within normal limits.

Skull base and vertebrae: 7 cervical segments are well visualized.
Vertebral body height is well maintained. Large anterior flowing
osteophytes are seen from C3 to C7. Osteophytic changes are also
noted at C2-3 and C7-T1. Facet hypertrophic changes are seen. No
findings to suggest acute fracture or acute facet abnormality are
noted. The odontoid is within normal limits.

Soft tissues and spinal canal: Surrounding soft tissue structures
show no multiple nodular changes throughout the thyroid. The
dominant thyroid seen on the right is again identified measuring
approximately 3.3 cm in greatest AP dimension. This is stable from
the prior exam as well as a prior CT from 9394 and consistent with a
benign etiology.

Upper chest: Visualized lung fields are within normal limits.

Other: None
IMPRESSION: CT of the head: No acute intracranial abnormality noted.

Chronic atrophic and ischemic changes are seen.

Left supraorbital soft tissue swelling consistent with the recent
injury.

CT of the cervical spine: Multilevel degenerative changes stable
from the prior exam.

Nodular changes within the thyroid also stable from a prior exam as
well as an exam from 9394 consistent with a benign etiology. No
followup recommended (ref: [HOSPITAL]. [DATE]):
